# Patient Record
Sex: Male | Born: 1955
Health system: Southern US, Community
[De-identification: ages and names within clinical notes are randomized; demographics above are authoritative.]

## PROBLEM LIST (undated history)

## (undated) DIAGNOSIS — R1310 Dysphagia, unspecified: Secondary | ICD-10-CM

## (undated) DIAGNOSIS — R2681 Unsteadiness on feet: Secondary | ICD-10-CM

## (undated) DIAGNOSIS — Z720 Tobacco use: Secondary | ICD-10-CM

## (undated) DIAGNOSIS — T8859XA Other complications of anesthesia, initial encounter: Secondary | ICD-10-CM

## (undated) DIAGNOSIS — R296 Repeated falls: Secondary | ICD-10-CM

## (undated) DIAGNOSIS — S065XAA Traumatic subdural hemorrhage with loss of consciousness status unknown, initial encounter: Secondary | ICD-10-CM

## (undated) DIAGNOSIS — F32A Depression, unspecified: Secondary | ICD-10-CM

## (undated) DIAGNOSIS — W19XXXA Unspecified fall, initial encounter: Secondary | ICD-10-CM

## (undated) DIAGNOSIS — C801 Malignant (primary) neoplasm, unspecified: Secondary | ICD-10-CM

## (undated) DIAGNOSIS — Z87442 Personal history of urinary calculi: Secondary | ICD-10-CM

## (undated) DIAGNOSIS — Z86718 Personal history of other venous thrombosis and embolism: Secondary | ICD-10-CM

## (undated) DIAGNOSIS — M1990 Unspecified osteoarthritis, unspecified site: Secondary | ICD-10-CM

## (undated) DIAGNOSIS — K219 Gastro-esophageal reflux disease without esophagitis: Secondary | ICD-10-CM

## (undated) DIAGNOSIS — E785 Hyperlipidemia, unspecified: Secondary | ICD-10-CM

## (undated) DIAGNOSIS — D3A8 Other benign neuroendocrine tumors: Secondary | ICD-10-CM

## (undated) DIAGNOSIS — N289 Disorder of kidney and ureter, unspecified: Secondary | ICD-10-CM

## (undated) HISTORY — PX: CHOLECYSTECTOMY: SHX55

## (undated) HISTORY — PX: OTHER SURGICAL HISTORY: SHX169

## (undated) HISTORY — DX: Unspecified osteoarthritis, unspecified site: M19.90

## (undated) HISTORY — PX: ABDOMINAL SURGERY: SHX537

## (undated) HISTORY — DX: Traumatic subdural hemorrhage with loss of consciousness status unknown, initial encounter: S06.5XAA

## (undated) HISTORY — DX: Gastro-esophageal reflux disease without esophagitis: K21.9

## (undated) HISTORY — DX: Unsteadiness on feet: R26.81

## (undated) HISTORY — DX: Disorder of kidney and ureter, unspecified: N28.9

---

## 2008-05-18 ENCOUNTER — Ambulatory Visit: Payer: Self-pay | Admitting: Urology

## 2008-05-18 IMAGING — CT CT ABD-PELV W/O CM
1 of 2 series · 14 of 32 positions shown, 18 images · non-contrast
Comparison: none

REASON FOR EXAM: hematuria and bph hx kidney stones
COMMENTS:

PROCEDURE:     CT  - CT ABDOMEN AND PELVIS W[DATE] [DATE]
RESULT:     Indication: Renal stone.
TECHNIQUE: A standard renal stone protocol was performed with sequential 5
mm noncontrasted axial images from the lung bases to the symphysis pubis
with the patient in a supine position.

[Series 2: soft tissue · axial · 0.79mm/px · z∈[-1432,-1000]mm · 14 of 164 slices shown, 18 images]
[im 13/164  soft-tissue]
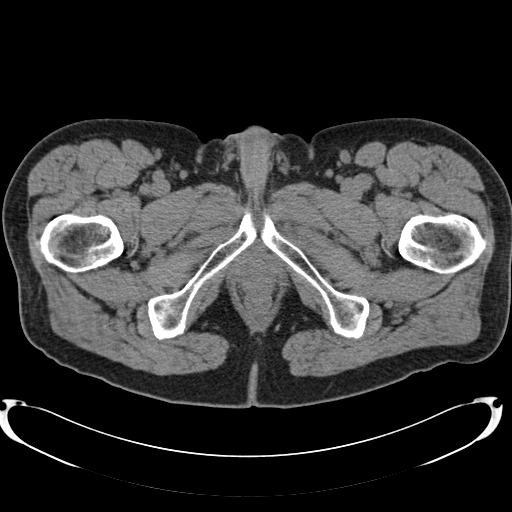
[im 13/164  bone]
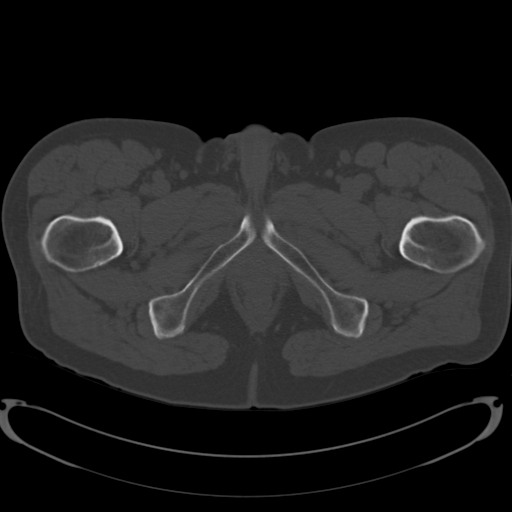
[im 25/164  soft-tissue]
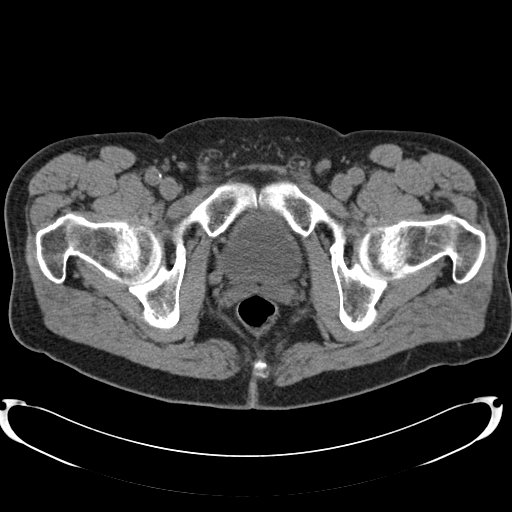
[im 37/164  soft-tissue]
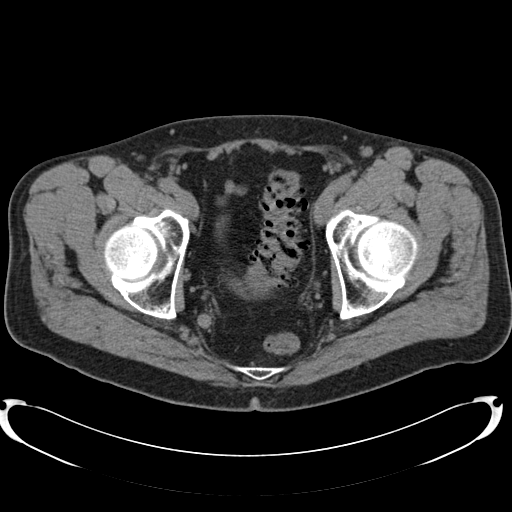
[im 49/164  soft-tissue]
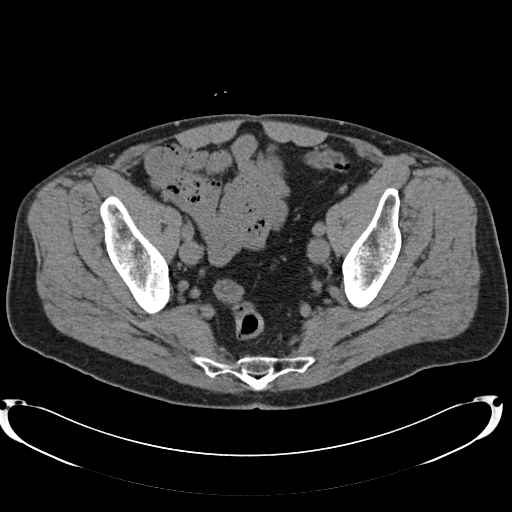
[im 61/164  soft-tissue]
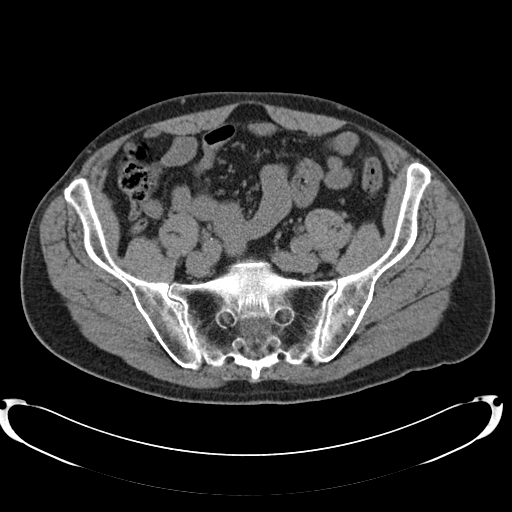
[im 73/164  soft-tissue]
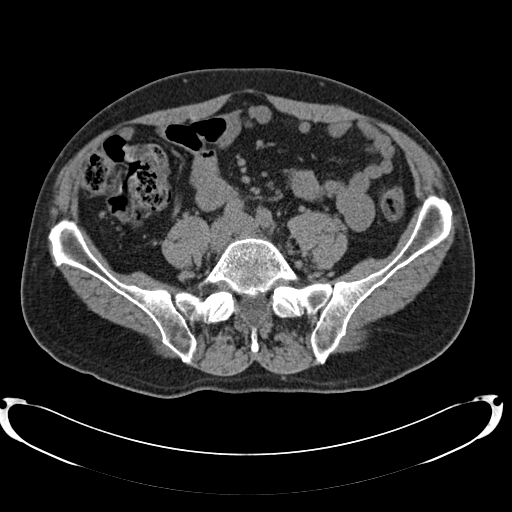
[im 91/164  soft-tissue]
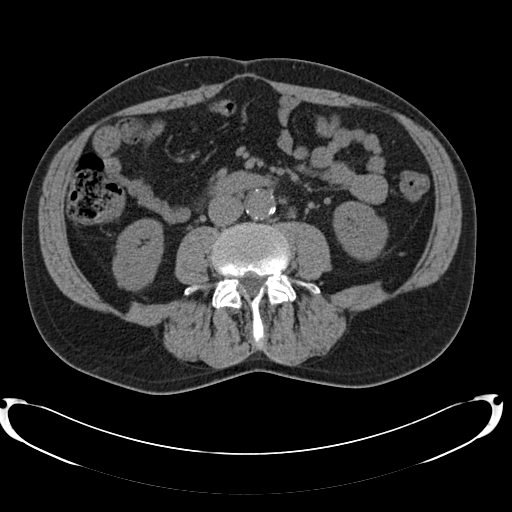
[im 103/164  soft-tissue]
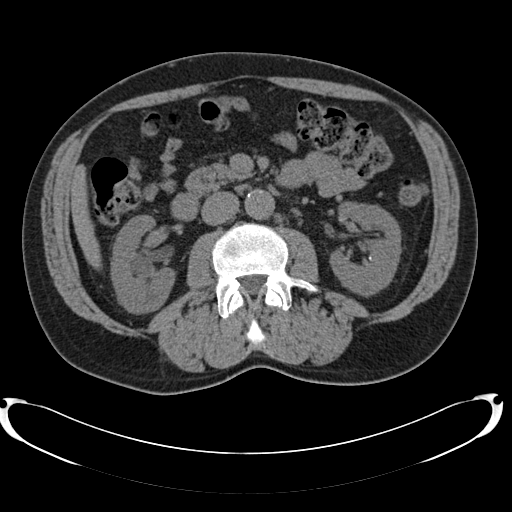
[im 115/164  soft-tissue]
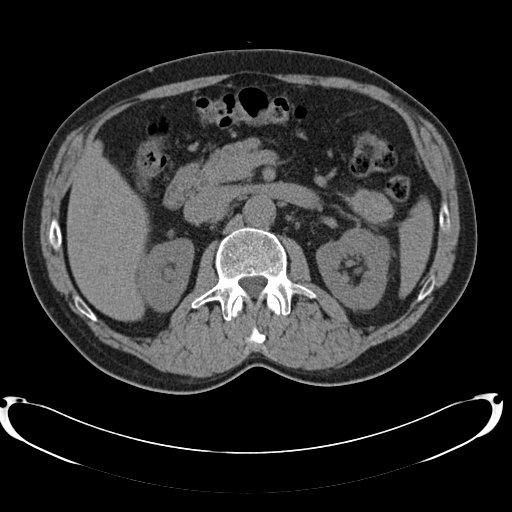
[im 115/164  bone]
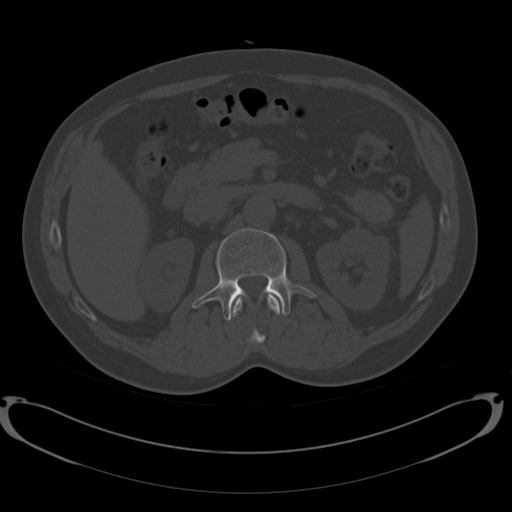
[im 127/164  soft-tissue]
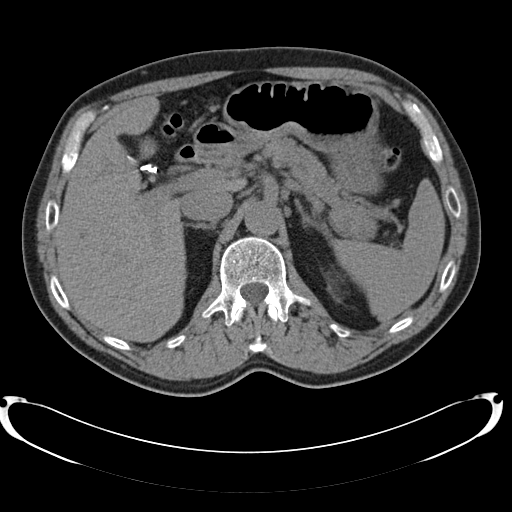
[im 139/164  soft-tissue]
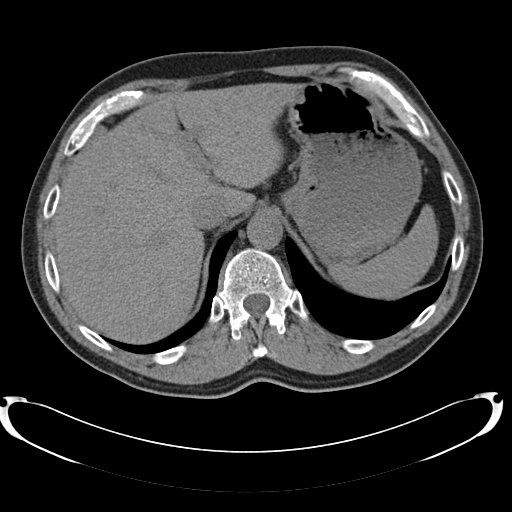
[im 139/164  lung]
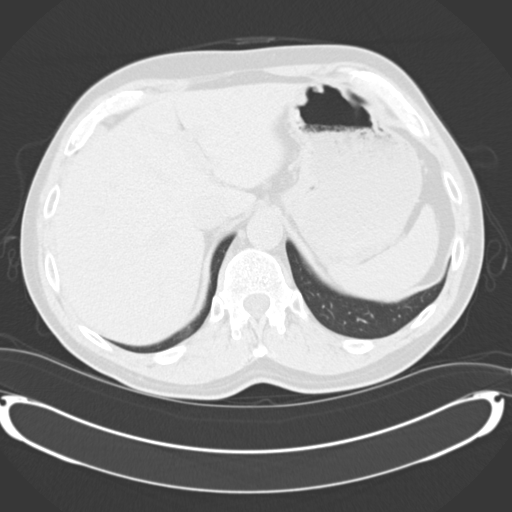
[im 145/164  lung]
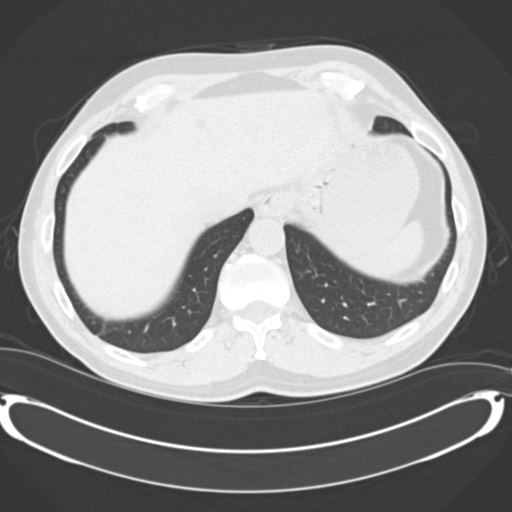
[im 151/164  soft-tissue]
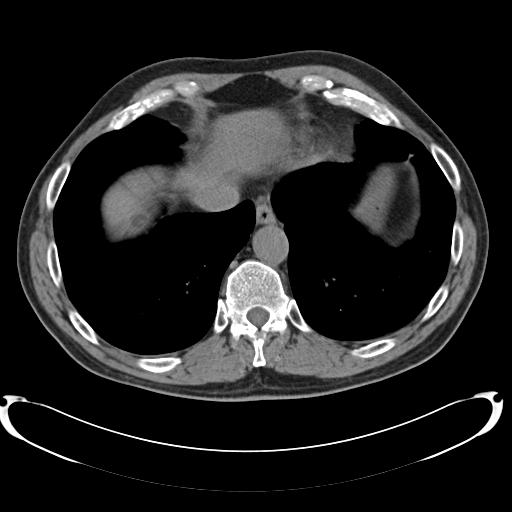
[im 151/164  lung]
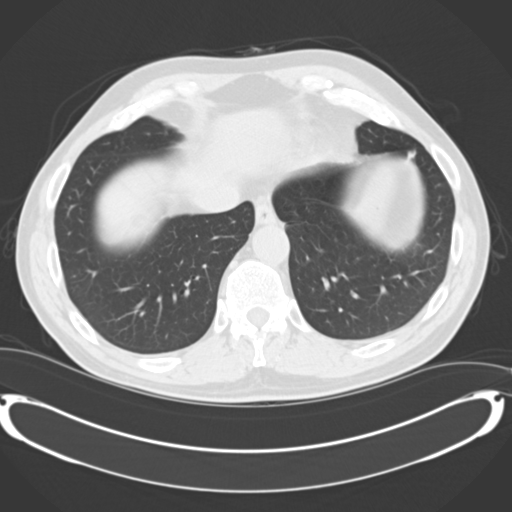
[im 157/164  lung]
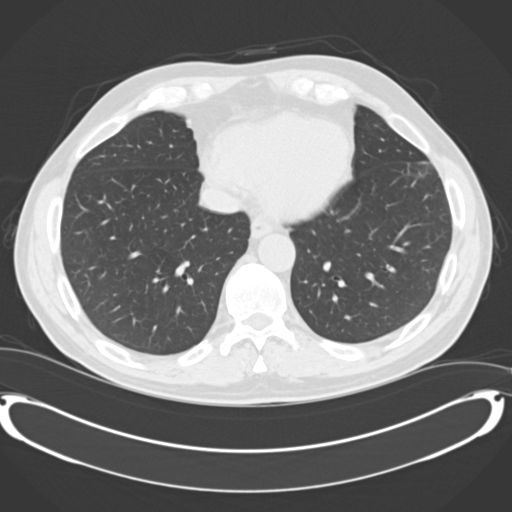

[14 of 32 positions shown; findings below may reference images not displayed]

FINDINGS: Limited views of the lung bases fail to demonstrate abnormal parenchymal
nodules or pleural or pericardial effusions.

There are 3 punctate nonobstructing left renal calculi and one punctate
nonobstructing right renal calculus. There is a 1.6 x 1.6 cm hypodense,
fluid attenuating posterior interpolar right renal mass most consistent with
a cyst. There no ureteral or bladder calculi. No obstructive uropathy. There
are no secondary signs of obstruction. No perinephric stranding is seen. The
kidneys are symmetric in size without evidence for exophytic mass.

The liver demonstrates no focal abnormality. The gallbladder is
unremarkable. The spleen demonstrates no focal abnormality. The adrenal
glands are normal. There is a 4.4 x 5.9 cm soft tissue mass in the region of
the pancreatic tail which is concerning for a pancreatic tail mass.

The unopacified stomach, duodenum, small intestine, and large intestine are
unremarkable, but evaluation is limited by lack of oral contrast. A normal
caliber, air filled appendix is visualized in the right lower quadrant
without. Appendiceal inflammatory changes. There is diverticulosis of the
sigmoid colon without evidence of diverticulitis. There is no
pneumoperitoneum, pneumatosis, or portal venous gas. There is no abdominal
or pelvic free fluid. There is no lymphadenopathy.

Abdominal aorta is normal in caliber. There is atherosclerosis of the
abdominal aorta.

The osseous structures are unremarkable.
IMPRESSION: 1. Nonobstructing bilateral renal calculi without obstructive uropathy.

2. There is a 4.4 x 5.9 cm soft tissue mass in the region of the pancreatic
tail which is concerning for a pancreatic tail mass. Recommend further
evaluation with a contrast-enhanced CT of the abdomen.

These findings were communicated to Dr. MIREE on [DATE] at [TH]
hours.

## 2008-05-31 ENCOUNTER — Ambulatory Visit: Payer: Self-pay | Admitting: Family Medicine

## 2008-05-31 IMAGING — CT CT ABDOMEN W/ CM
1 of 3 series · 13 of 32 positions shown, 19 images · non-contrast
Comparison: none

REASON FOR EXAM: pancreatic mass
COMMENTS:

[Series 2: soft tissue · axial · 0.78mm/px · z∈[+36,+258]mm · 13 of 88 slices shown, 19 images]
[im 7/88  soft-tissue]
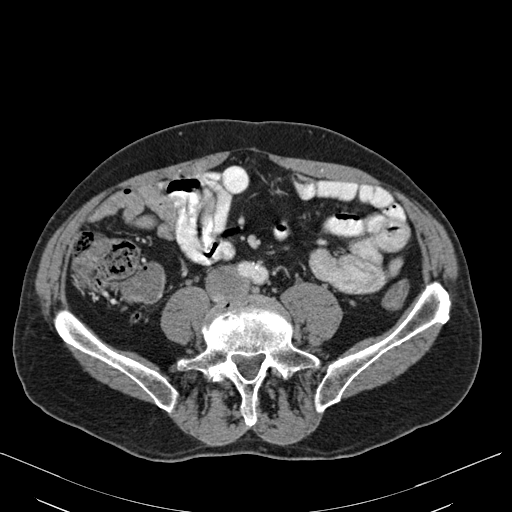
[im 7/88  bone]
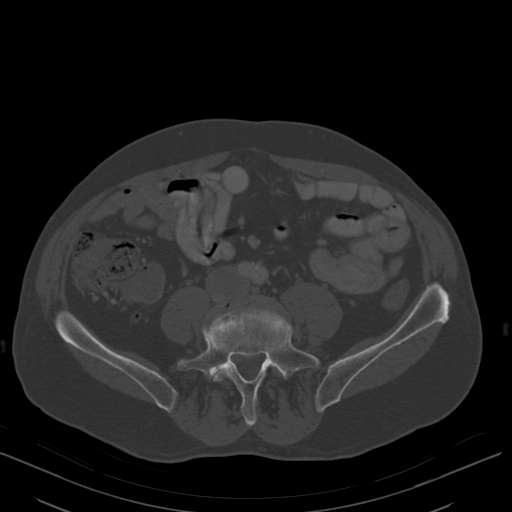
[im 13/88  soft-tissue]
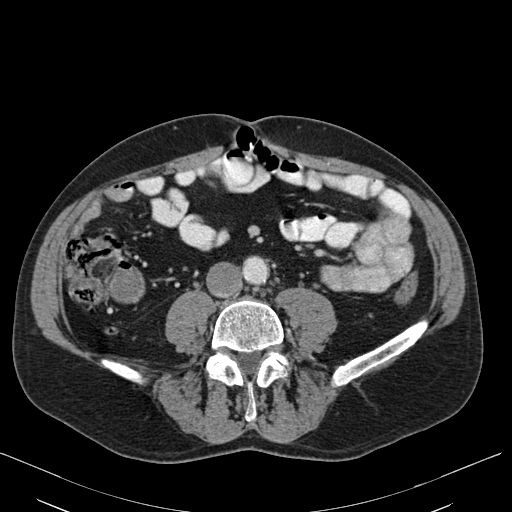
[im 19/88  soft-tissue]
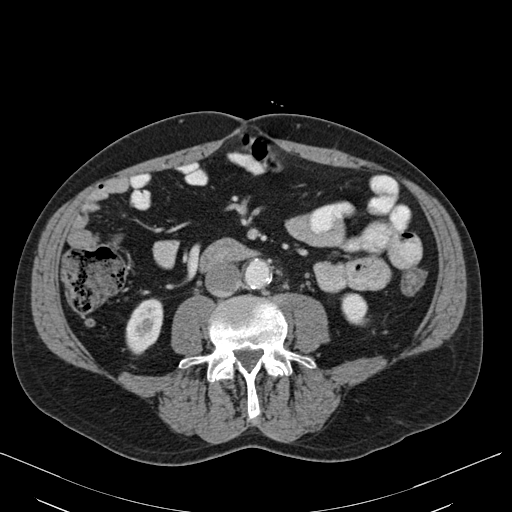
[im 25/88  soft-tissue]
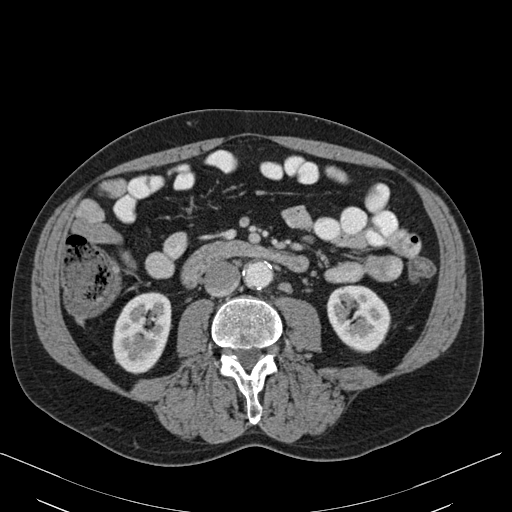
[im 32/88  soft-tissue]
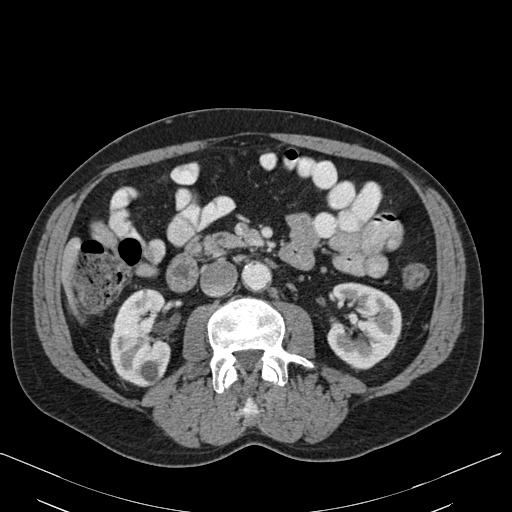
[im 38/88  soft-tissue]
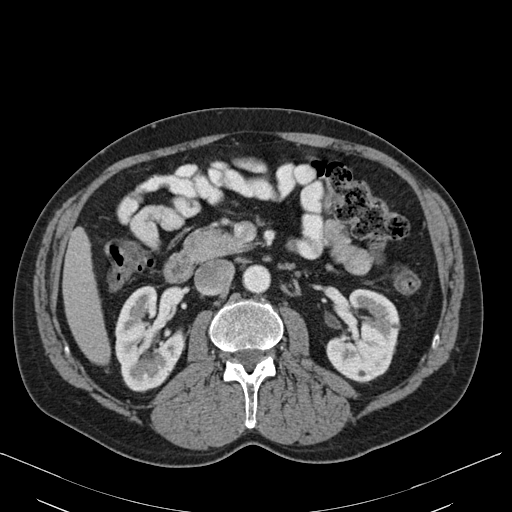
[im 44/88  soft-tissue]
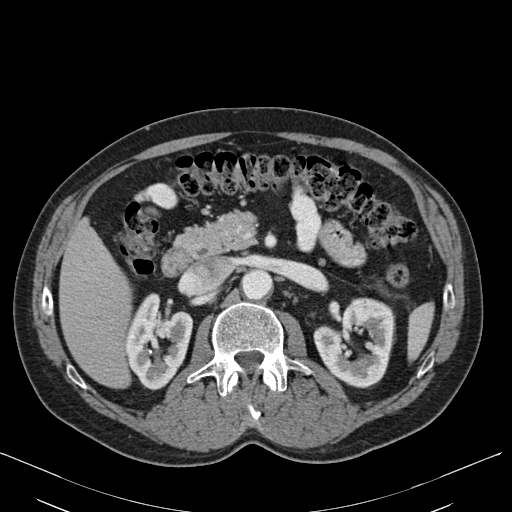
[im 50/88  soft-tissue]
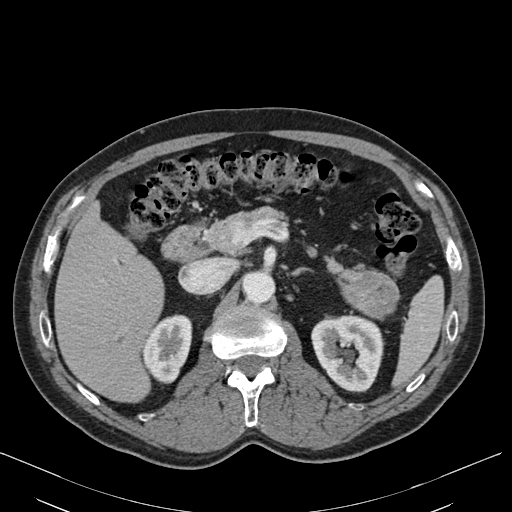
[im 56/88  soft-tissue]
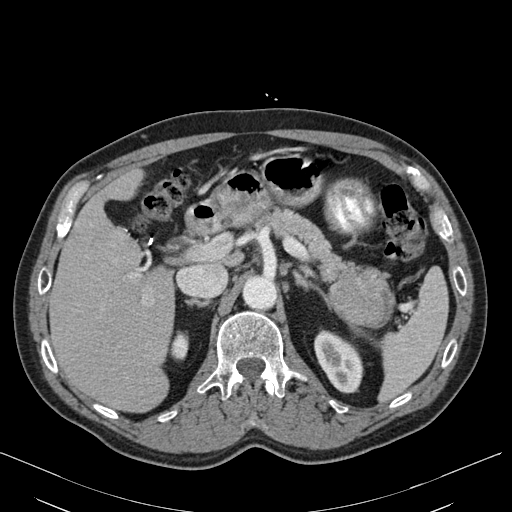
[im 56/88  bone]
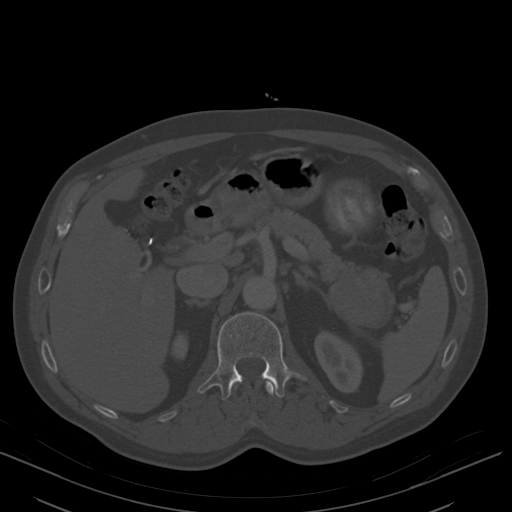
[im 63/88  soft-tissue]
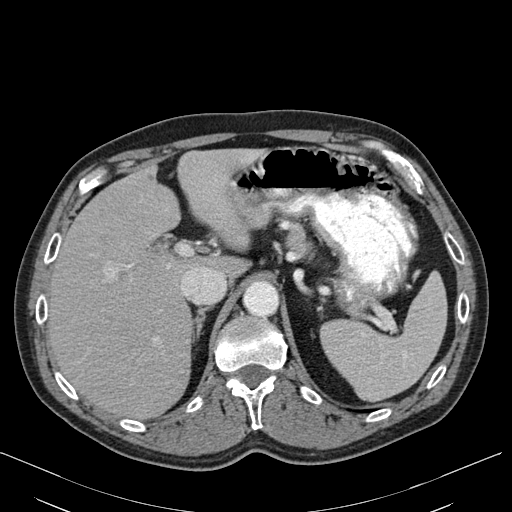
[im 63/88  lung]
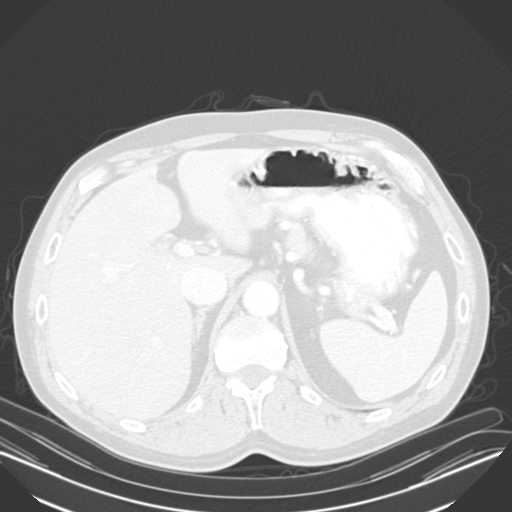
[im 69/88  soft-tissue]
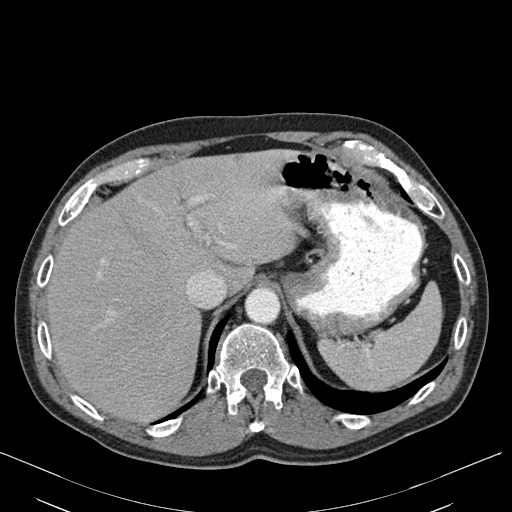
[im 69/88  lung]
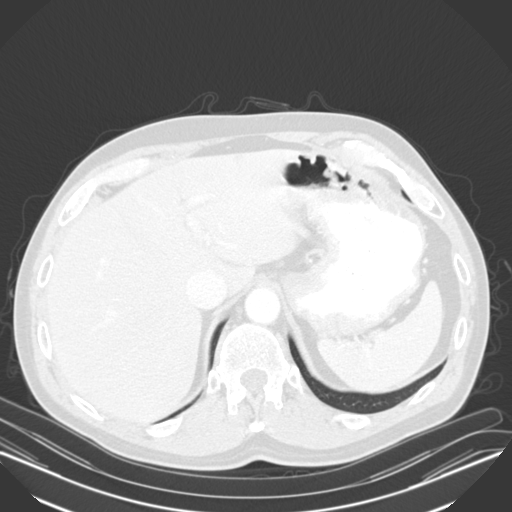
[im 75/88  soft-tissue]
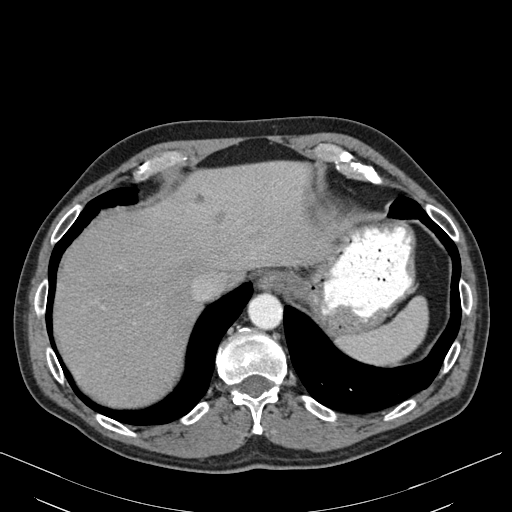
[im 75/88  lung]
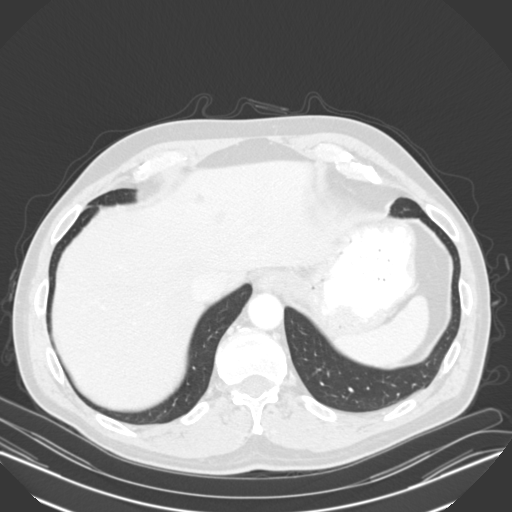
[im 81/88  soft-tissue]
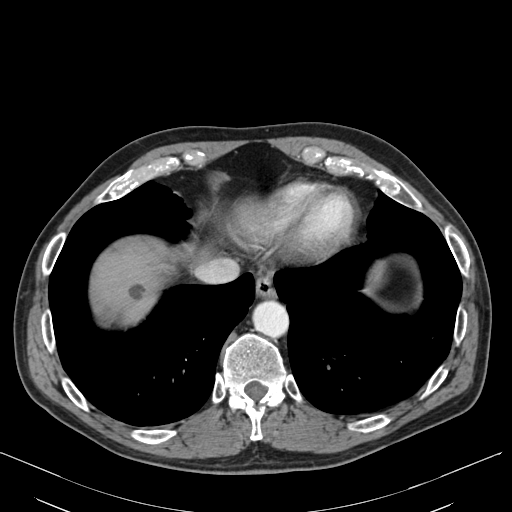
[im 81/88  lung]
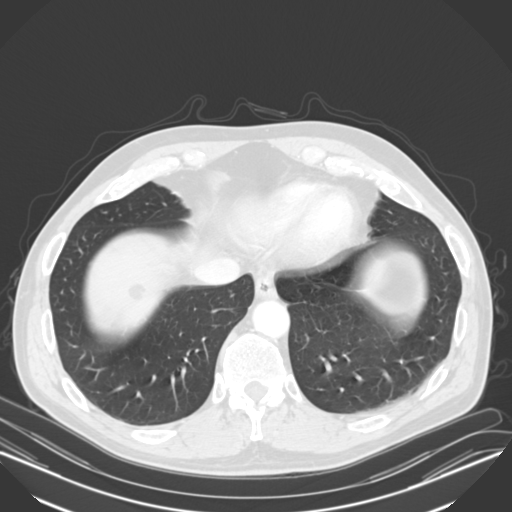

[13 of 32 positions shown; findings below may reference images not displayed]

PROCEDURE:     MANIA - MANIA ABDOMEN COMPLEX W  - [DATE]  [DATE]

RESULT:     CT of the abdomen is performed utilizing approximately 100 ml of
[GL] iodinated intravenous contrast along with oral contrast.
Correlation is made with the previous noncontrast exam dated [DATE].

There is a large soft tissue mass arising from the pancreatic tail. This
measures up to 5.5 x 3.4 cm. Malignancy is certainly a primary
consideration. The liver demonstrates multiple nonenhancing low attenuation
areas suggestive of multiple small cysts. Cholecystectomy changes are
present. There is a cyst present in the posteromedial aspect of the lower
portion of the RIGHT kidney measuring up to approximately 2.0 cm. Smaller
similar areas are present in both kidneys. The spleen and adrenal glands are
unremarkable. There is no abnormal bowel distention. The abdominal aorta is
normal in caliber and contains some atherosclerotic calcification. There is
no definite adenopathy. There is some thickening of the anterior perirenal
fascia on the margin of the mass.
IMPRESSION: 1. Large pancreatic tail mass. Malignancy must be excluded. No definite
metastatic disease is evident.
2. Multiple small cysts in the liver, presumably benign.
3. Bilateral small renal cyst with a 2 mm posterior mid to lower portion
RIGHT renal cyst.

## 2008-06-22 DIAGNOSIS — D3A8 Other benign neuroendocrine tumors: Secondary | ICD-10-CM | POA: Insufficient documentation

## 2008-08-17 ENCOUNTER — Ambulatory Visit: Payer: Self-pay | Admitting: Family Medicine

## 2008-08-17 IMAGING — US US EXTREM LOW VENOUS*L*
1 series · 17 of 24 positions shown · non-contrast
Comparison: none

REASON FOR EXAM: call report  [PHONE_NUMBER] x [KU]  edema  eval for DVT
COMMENTS:

PROCEDURE:     US  - US DOPPLER LOW EXTR LEFT  - [DATE]  [DATE]
RESULT:     Comparison: No comparison

[Series 1: us extrem low venous*left* · 17 of 33 slices shown]
[im 1/33]
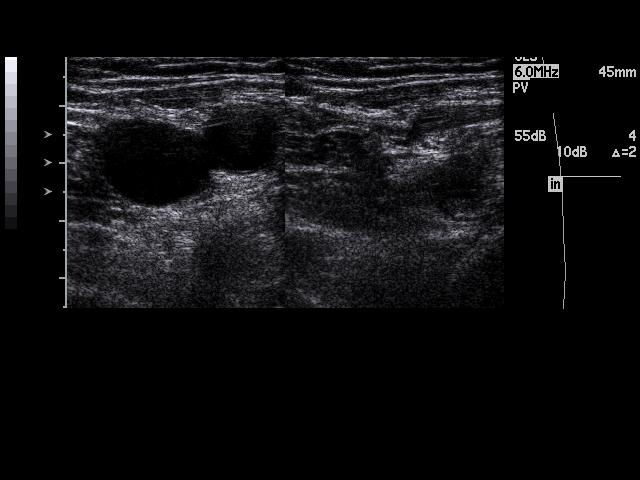
[im 3/33]
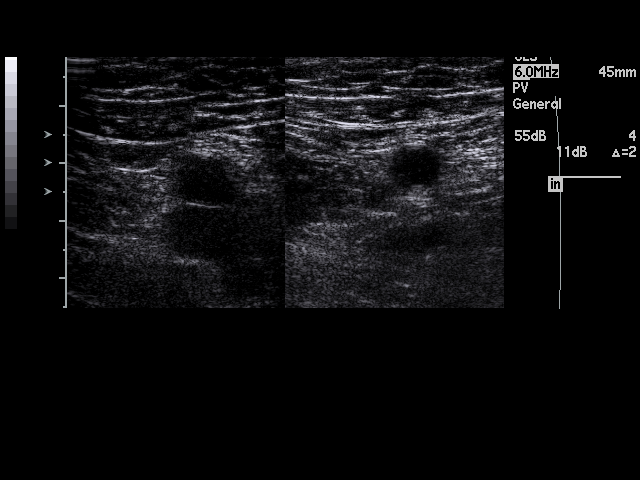
[im 5/33]
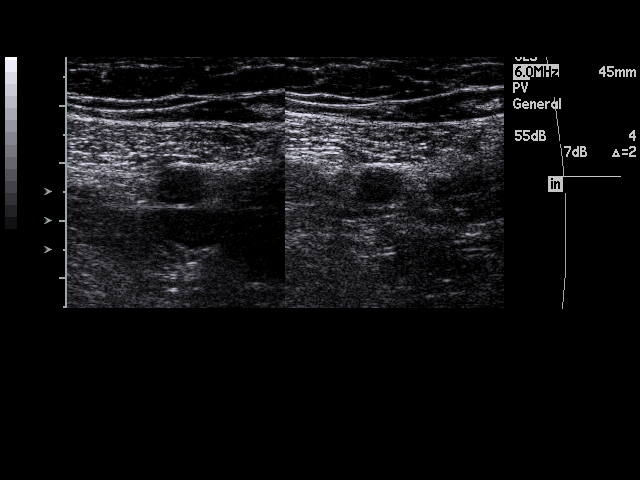
[im 6/33]
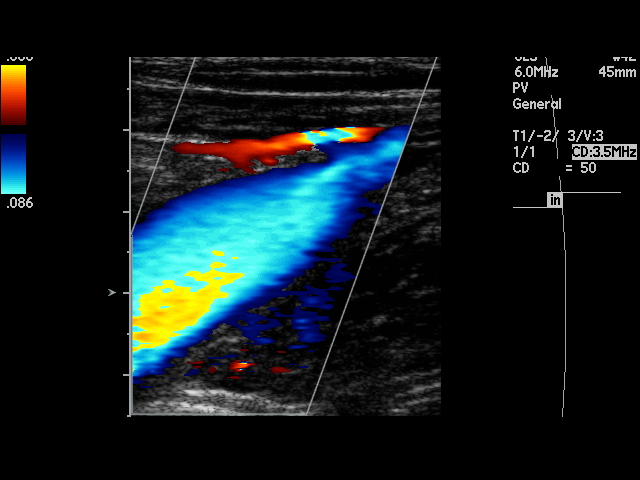
[im 9/33]
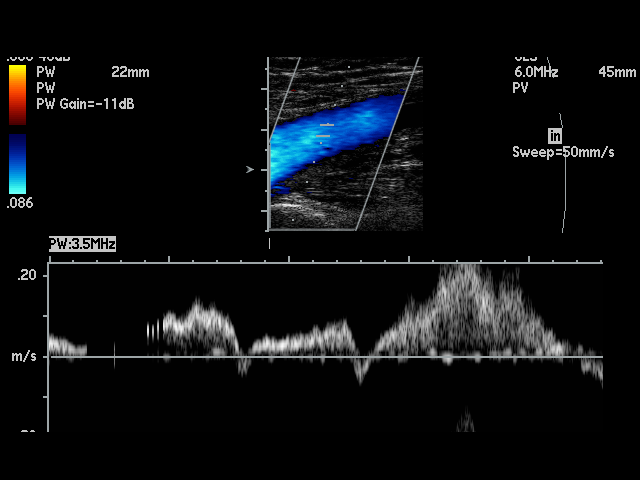
[im 10/33]
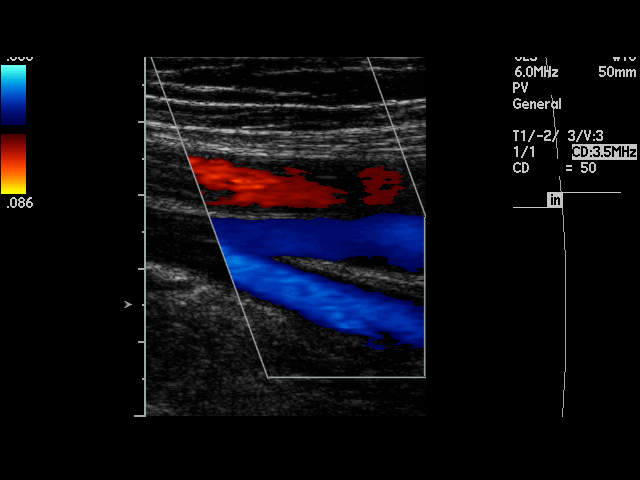
[im 13/33]
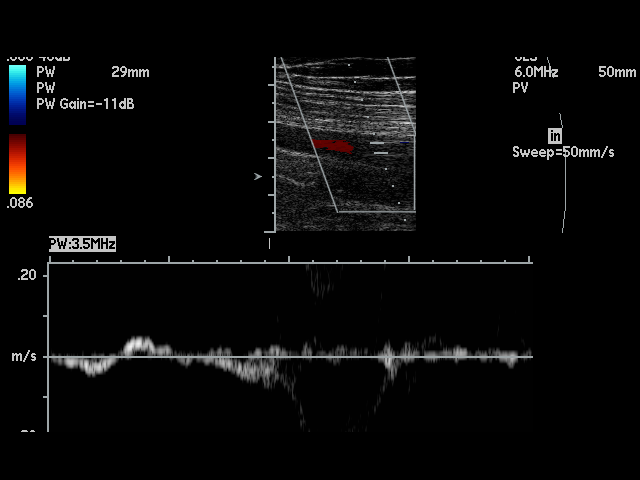
[im 14/33]
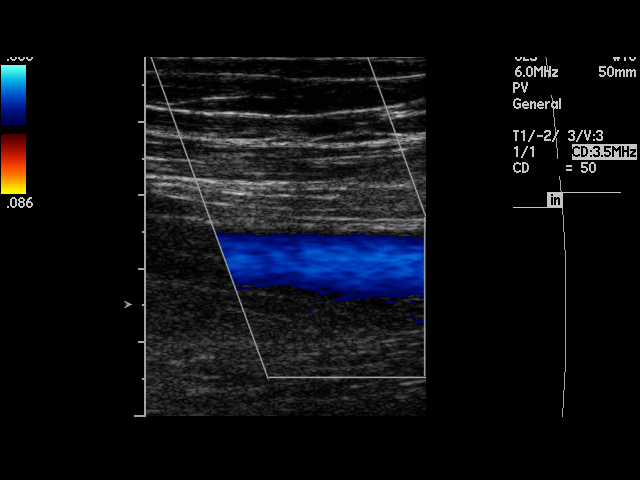
[im 17/33]
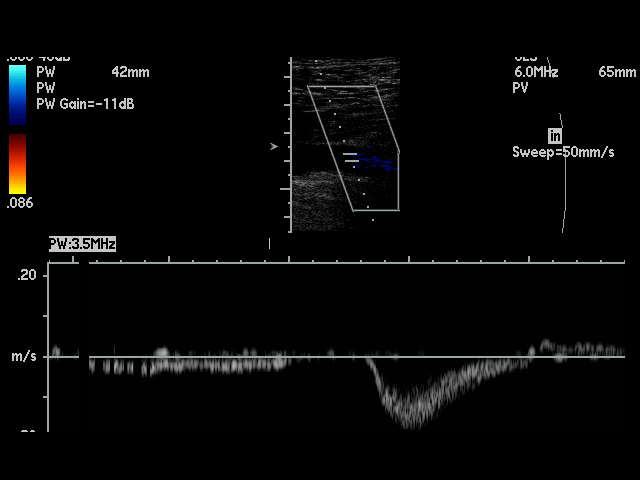
[im 19/33]
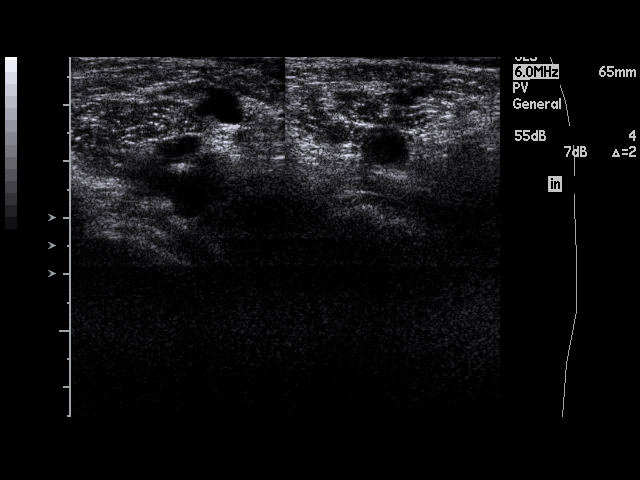
[im 20/33]
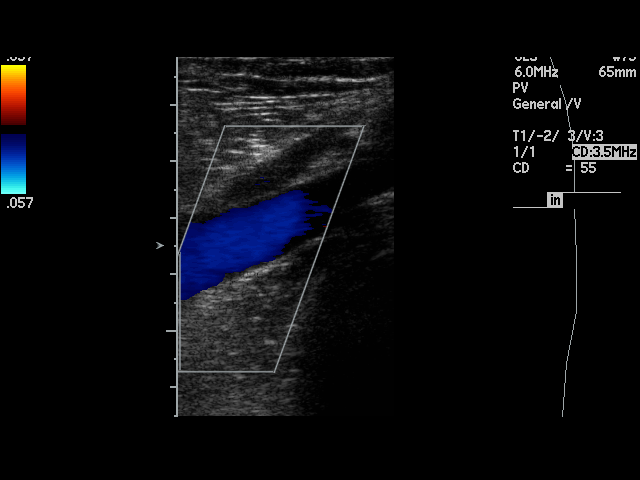
[im 23/33]
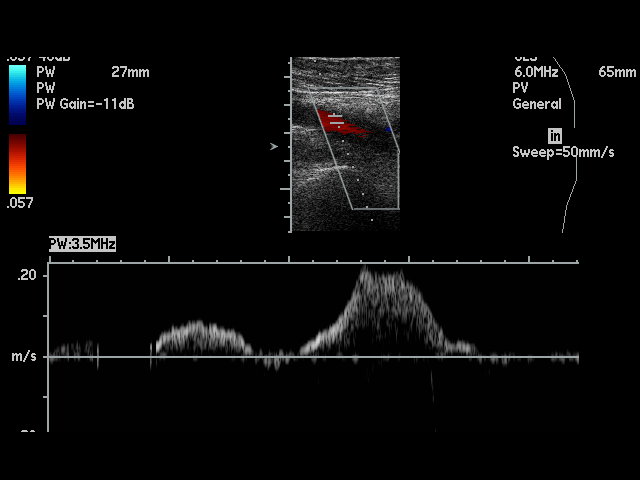
[im 24/33]
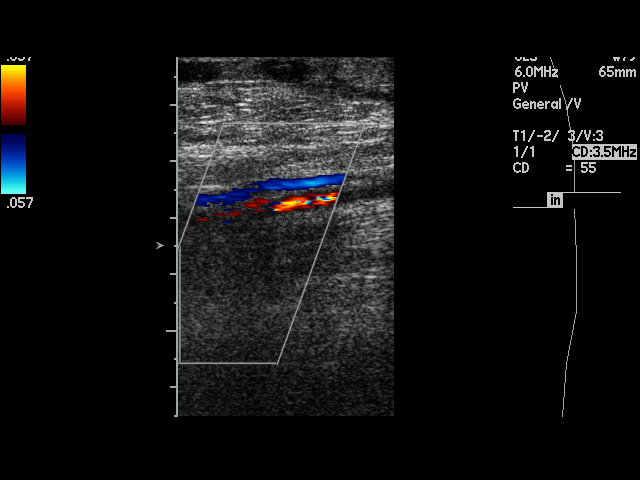
[im 27/33]
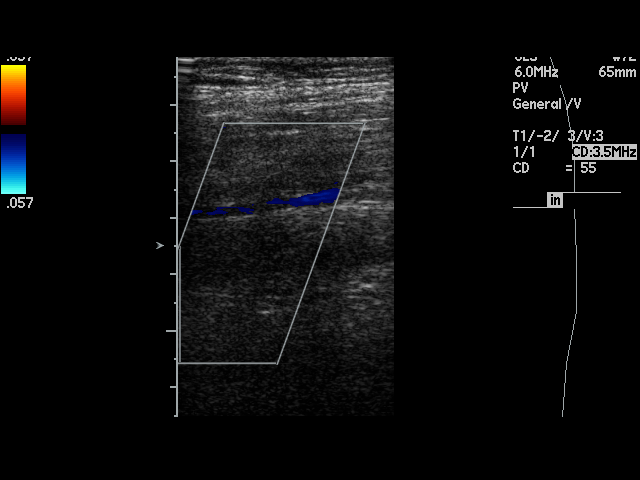
[im 28/33]
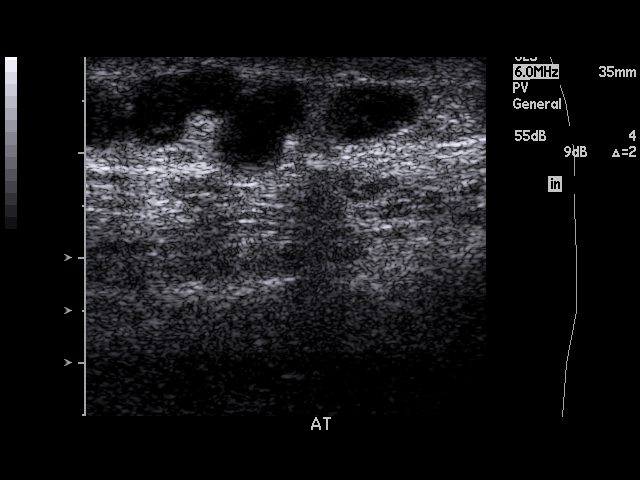
[im 30/33]
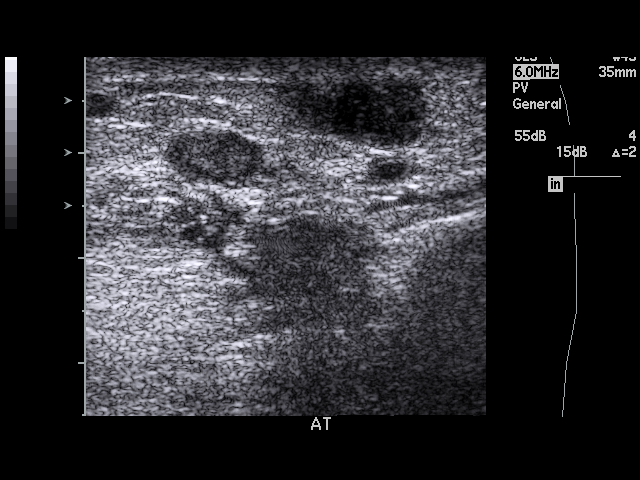
[im 33/33]
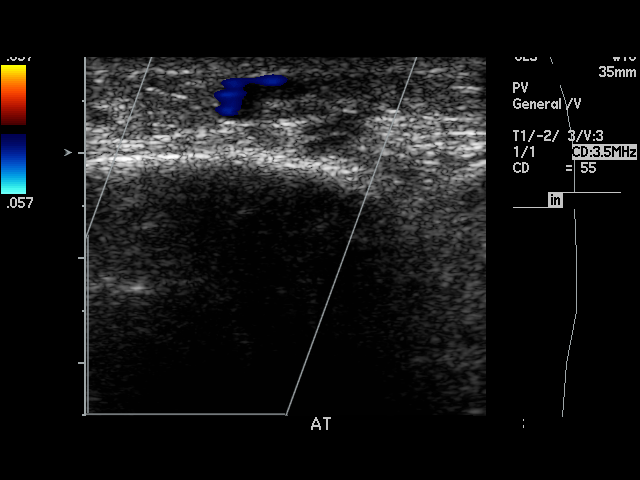

[17 of 24 positions shown; findings below may reference images not displayed]

FINDINGS: Multiple longitudinal and transverse gray-scale as well as color
and spectral Doppler images of the left lower extremity veins were obtained
from the common femoral veins through the popliteal veins.

The left common femoral, greater saphenous, femoral, popliteal veins, and
venous trifurcation are patent, demonstrating normal color-flow and
compressibility. No intraluminal thrombus is identified.There is normal
respiratory variation and augmentation demonstrated at all vein levels.

Incidental note is made of varicose veins which are noncompressible
consistent with thrombosis. These are not part of the deep venous system.
IMPRESSION: 1. No evidence of DVT in the left lower extremity.

2. Incidental note is made of varicose veins which are noncompressible
consistent with thrombosis. These are not part of the deep venous system.

## 2008-08-21 ENCOUNTER — Inpatient Hospital Stay (HOSPITAL_COMMUNITY): Admission: EM | Admit: 2008-08-21 | Discharge: 2008-08-28 | Payer: Self-pay | Admitting: Emergency Medicine

## 2008-08-21 ENCOUNTER — Encounter (INDEPENDENT_AMBULATORY_CARE_PROVIDER_SITE_OTHER): Payer: Self-pay | Admitting: Orthopaedic Surgery

## 2008-08-21 IMAGING — RF DG HIP OPERATIVE*R*
1 series · 5 of 5 positions shown · non-contrast
Comparison: Preoperative studies [DATE].

CLINICAL DATA: 53-year-old male with ORIF right hip.

OPERATIVE RIGHT HIP

[Series 1: run · 5 of 5 slices shown]
[im 1/5]
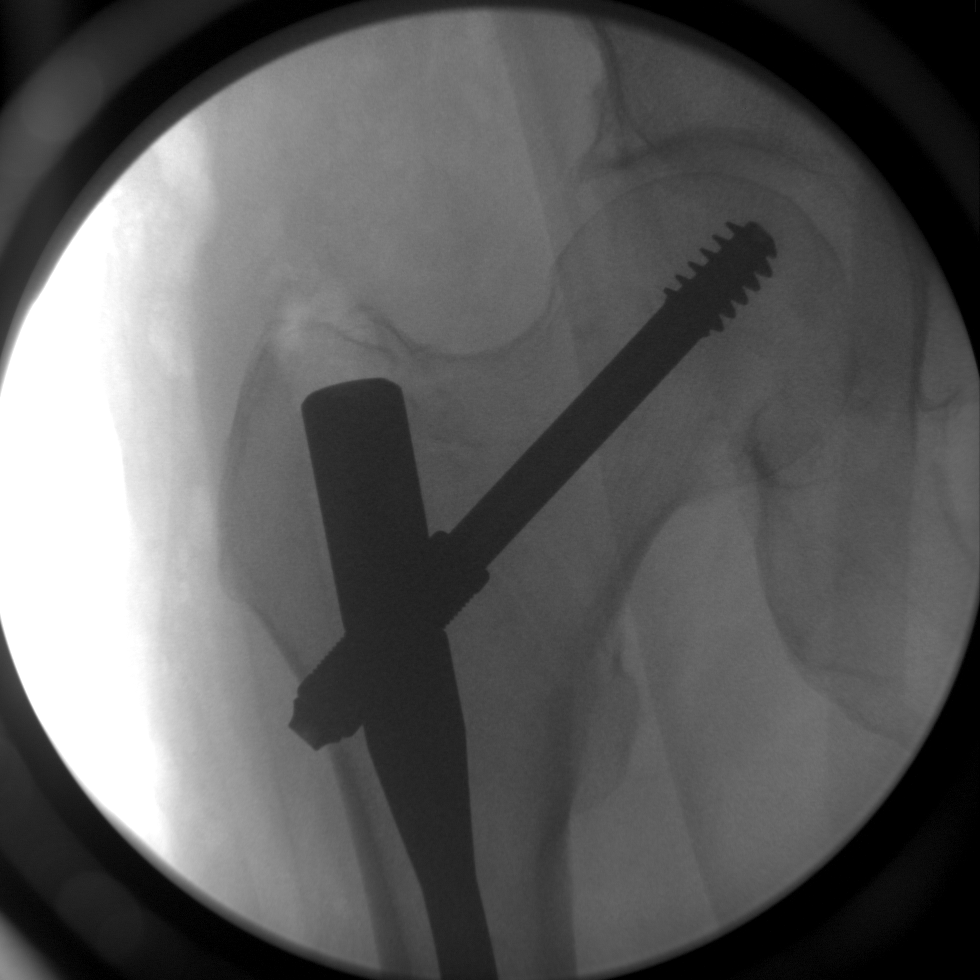
[im 2/5]
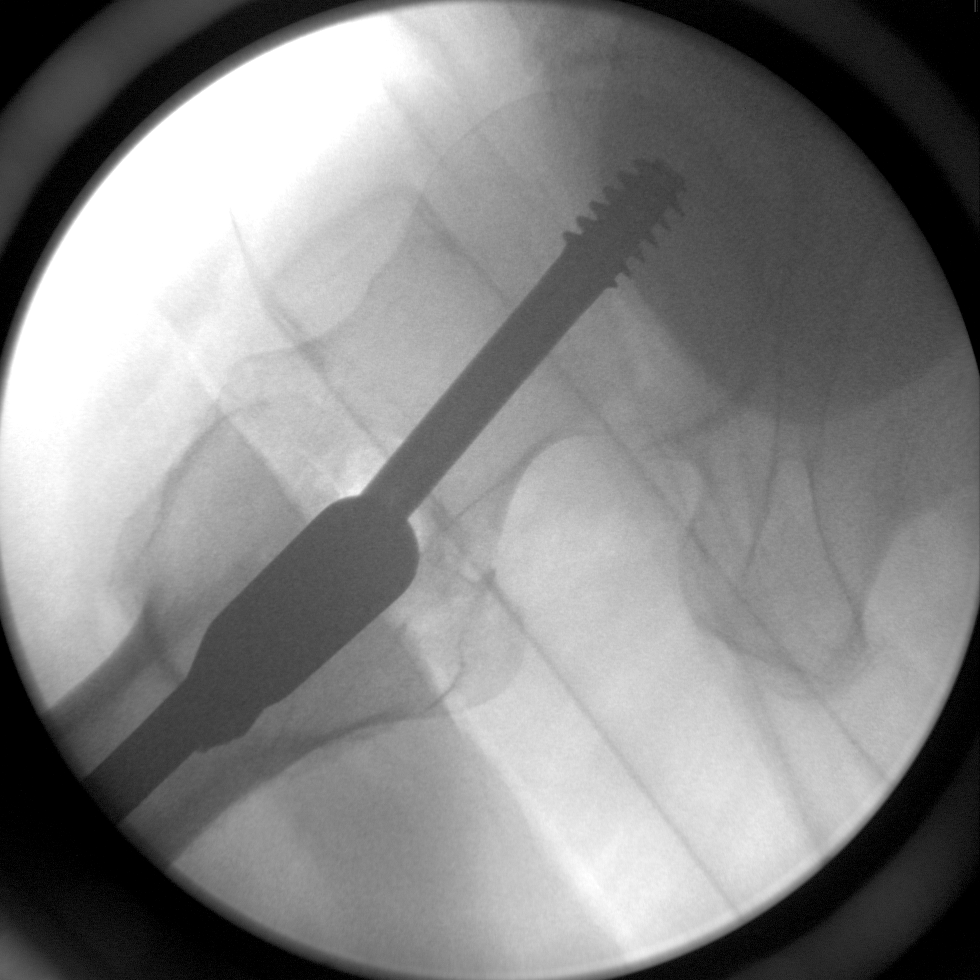
[im 3/5]
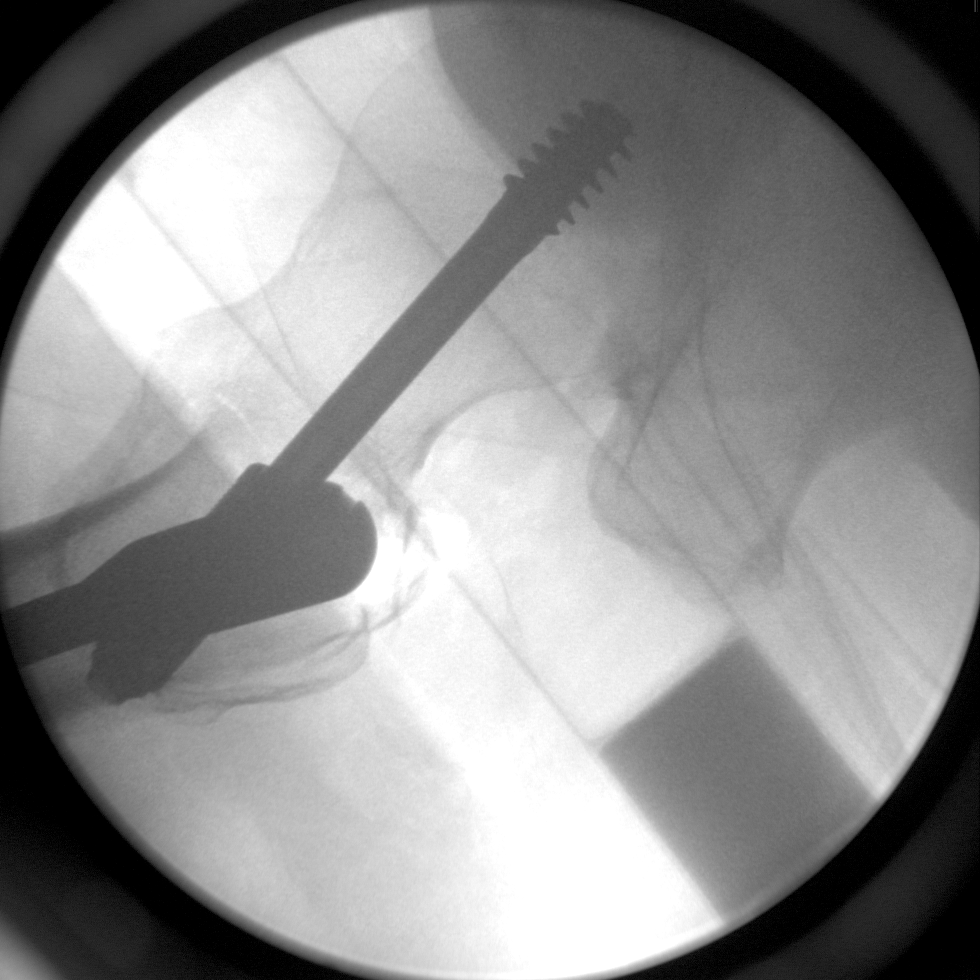
[im 4/5]
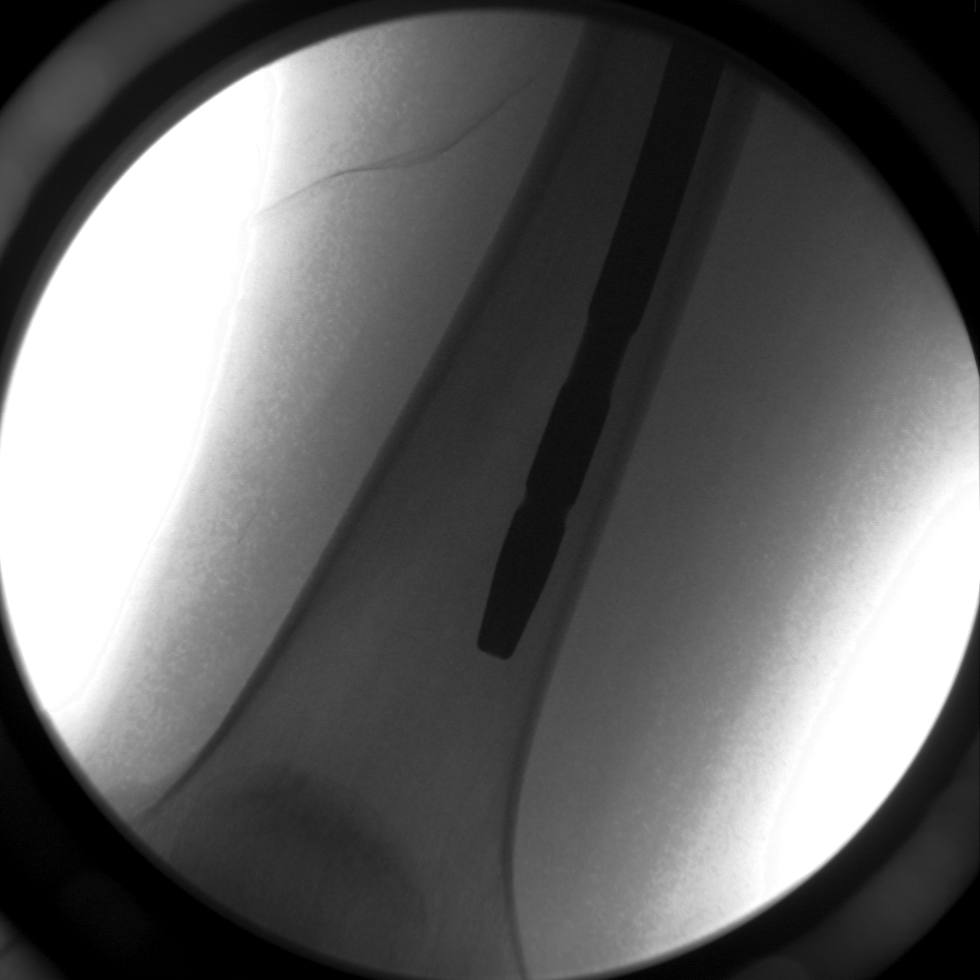
[im 5/5]
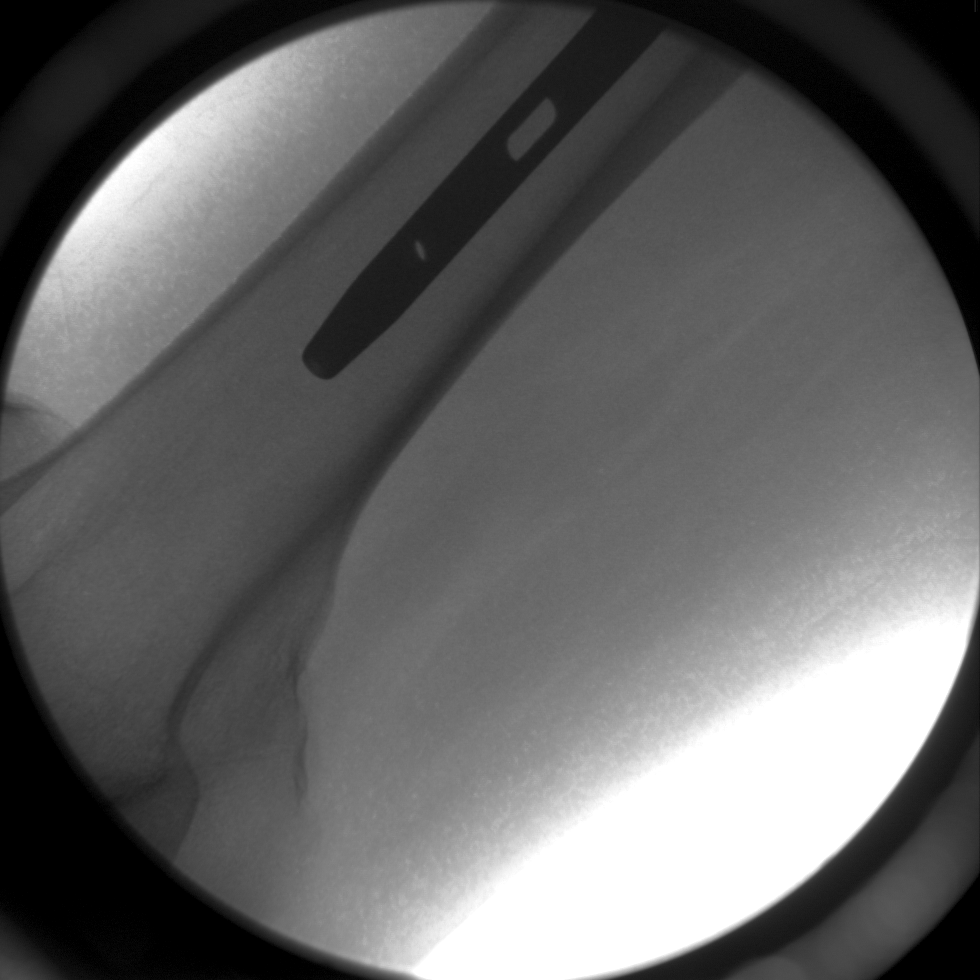

[5 of 5 positions shown; findings below may reference images not displayed]

FINDINGS: Five fluoroscopic spot views of the right hip in the
operating room.  Dynamic hip screw traversing the right femoral
neck.  Interval reduction of comminuted intertrochanteric fracture
with significantly improved alignment.  Mild posterior displacement
of the distal fragment remains.  Intramedullary nail in place.
Visualized hardware appears intact.
IMPRESSION: ORIF right hip with reduction of comminuted intertrochanteric
fracture.

## 2008-08-21 IMAGING — CR DG PELVIS 1-2V
1 series · 1 of 1 positions shown · non-contrast
Comparison: None

CLINICAL DATA: Fall, hip pain.

PELVIS - 1-2 VIEW

[t pelvis a.p.]
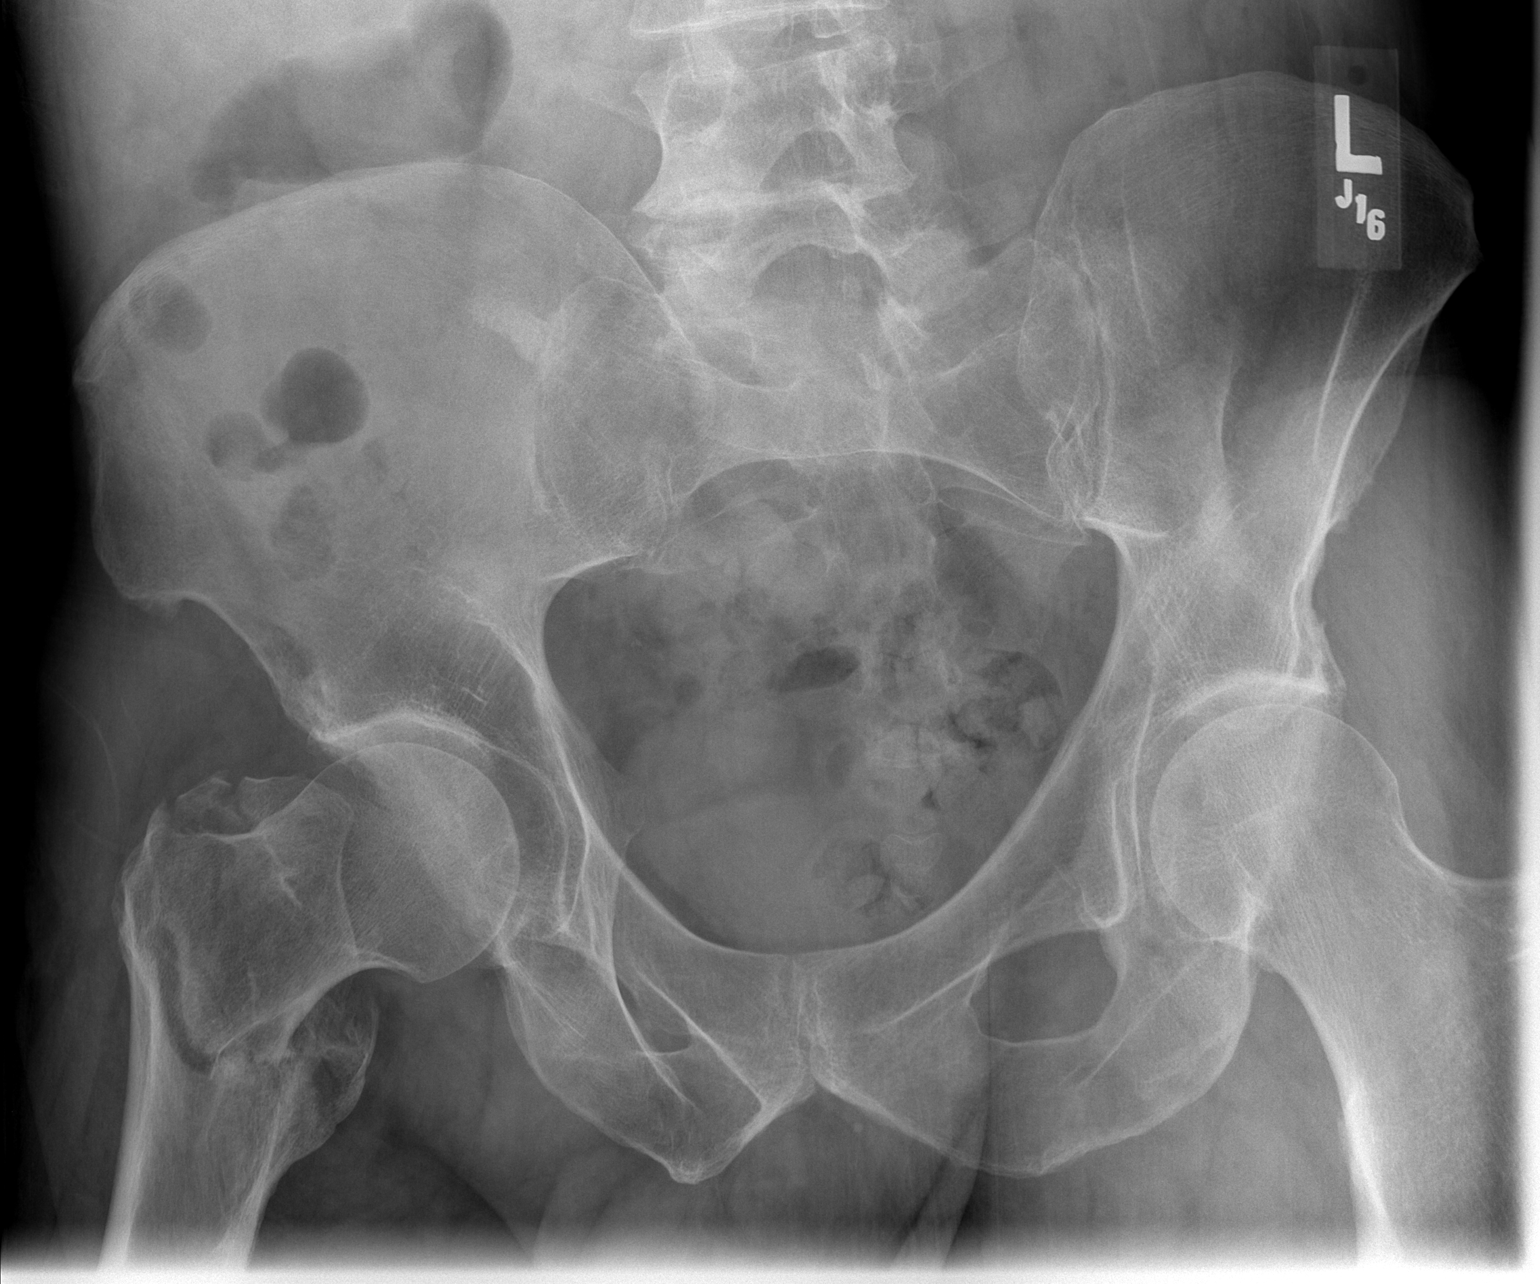

[1 of 1 positions shown; findings below may reference images not displayed]

FINDINGS: There is a comminuted right intertrochanteric fracture.
Mild varus angulation.  No evidence of dislocation.
IMPRESSION: Right intertrochanteric fracture.

REF:W2 DICTATED: [DATE] [DATE]

## 2008-08-21 IMAGING — CR DG HIP COMPLETE 2+V*R*
2 series · 2 of 2 positions shown · non-contrast
Comparison: None

CLINICAL DATA: Fall, right hip pain.

RIGHT HIP - COMPLETE 2+ VIEW

[t hip ap right]
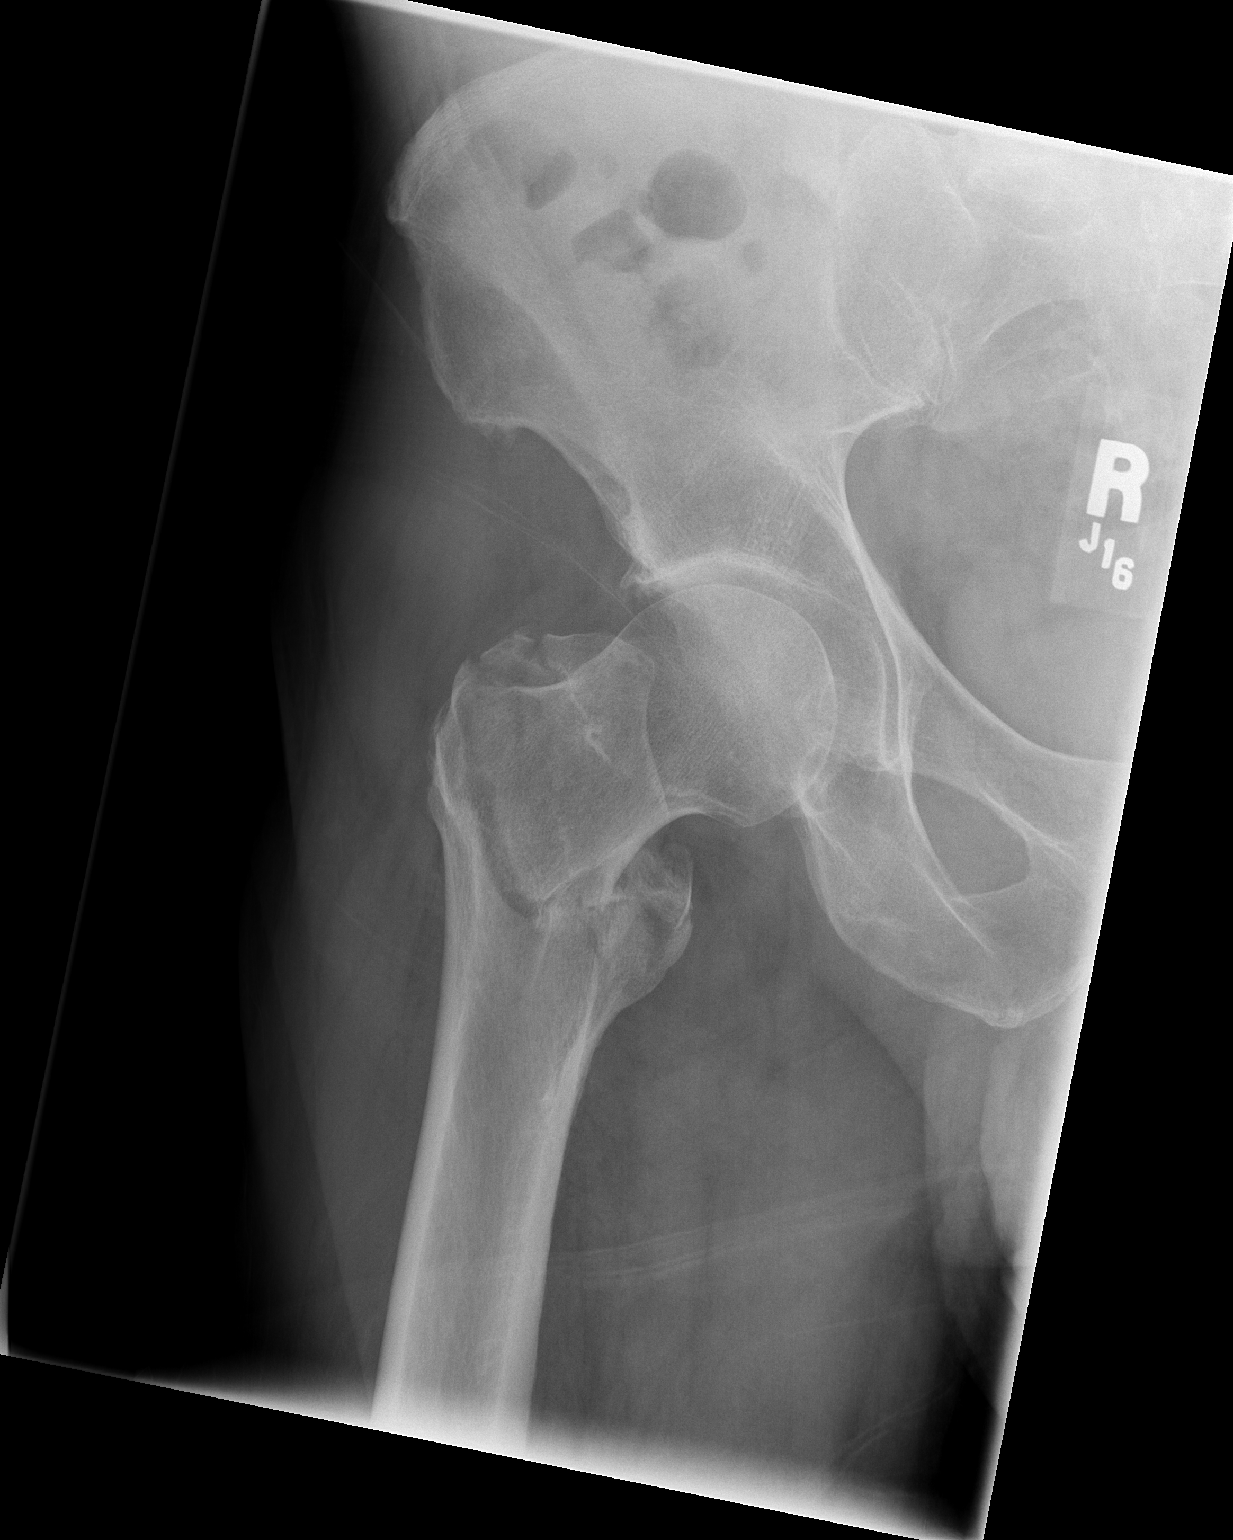

[w cross table hip right *]
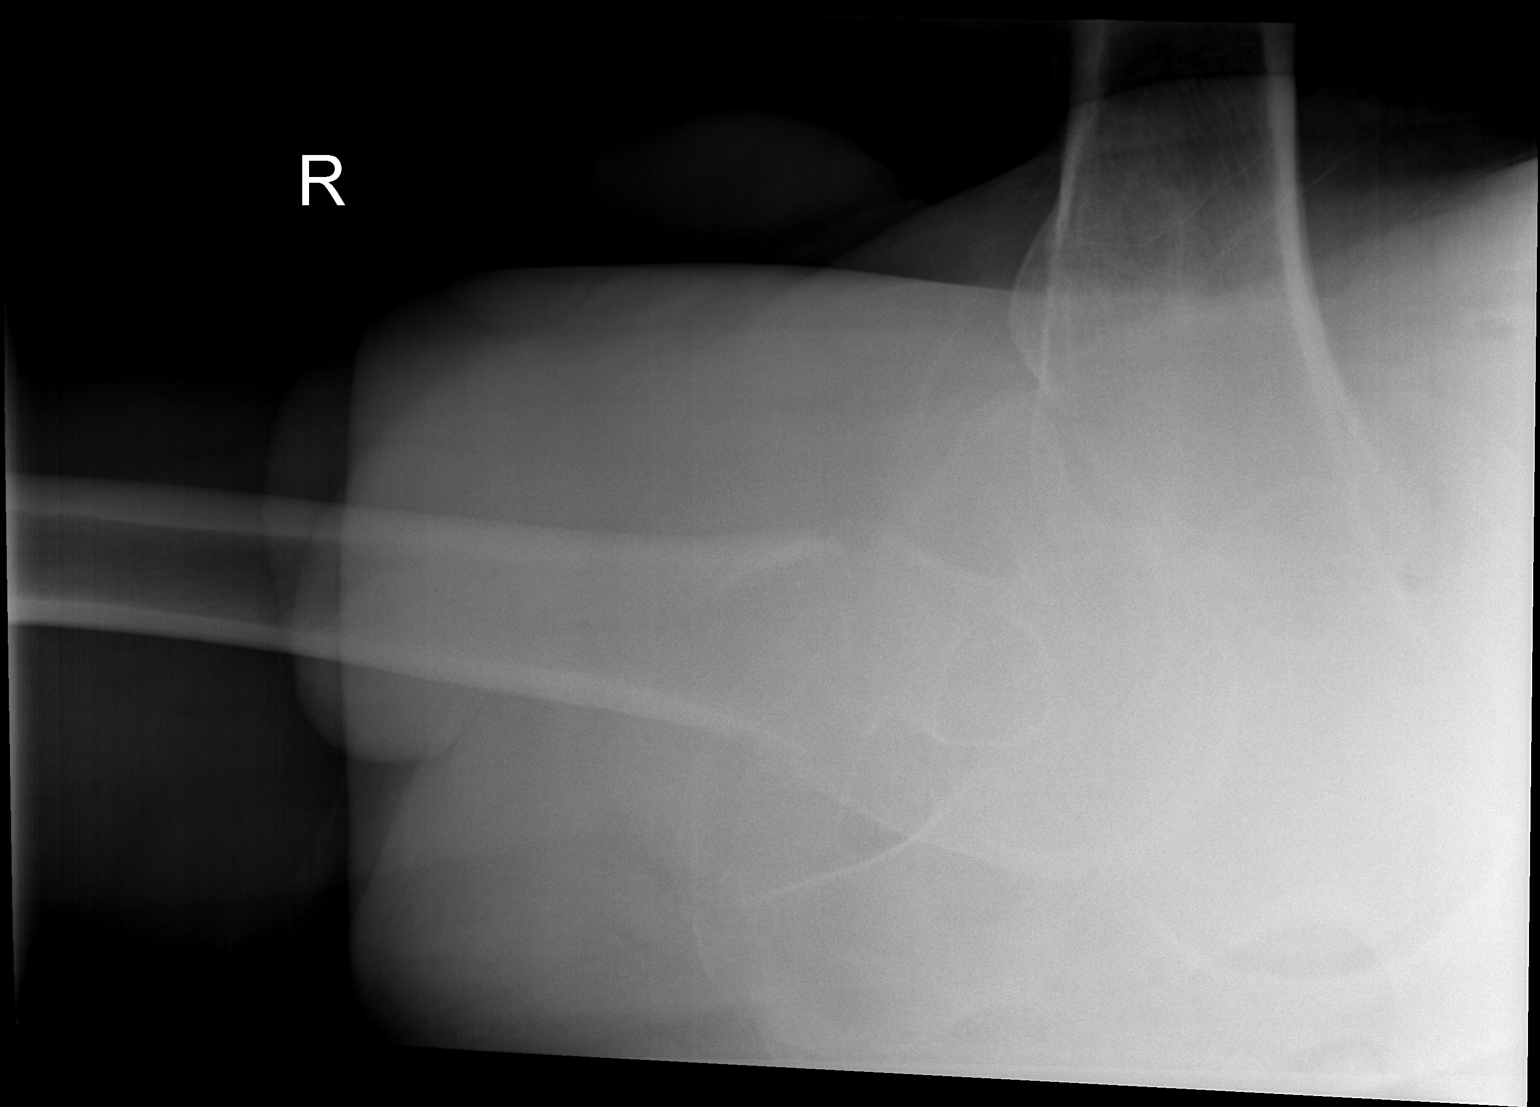

[2 of 2 positions shown; findings below may reference images not displayed]

FINDINGS: There is a comminuted right intertrochanteric fracture.
Mild varus angulation.  Mild displacement of fracture fragments.
No dislocation.
IMPRESSION: Mildly comminuted, displaced right intertrochanteric fracture noted

REF:W2 DICTATED: [DATE] [DATE]

## 2008-08-21 IMAGING — CR DG CHEST 2V
4 series · 4 of 4 positions shown · non-contrast
Comparison: None

CLINICAL DATA: Fall

CHEST - 2 VIEW

[w chest lat (1 of 2)]
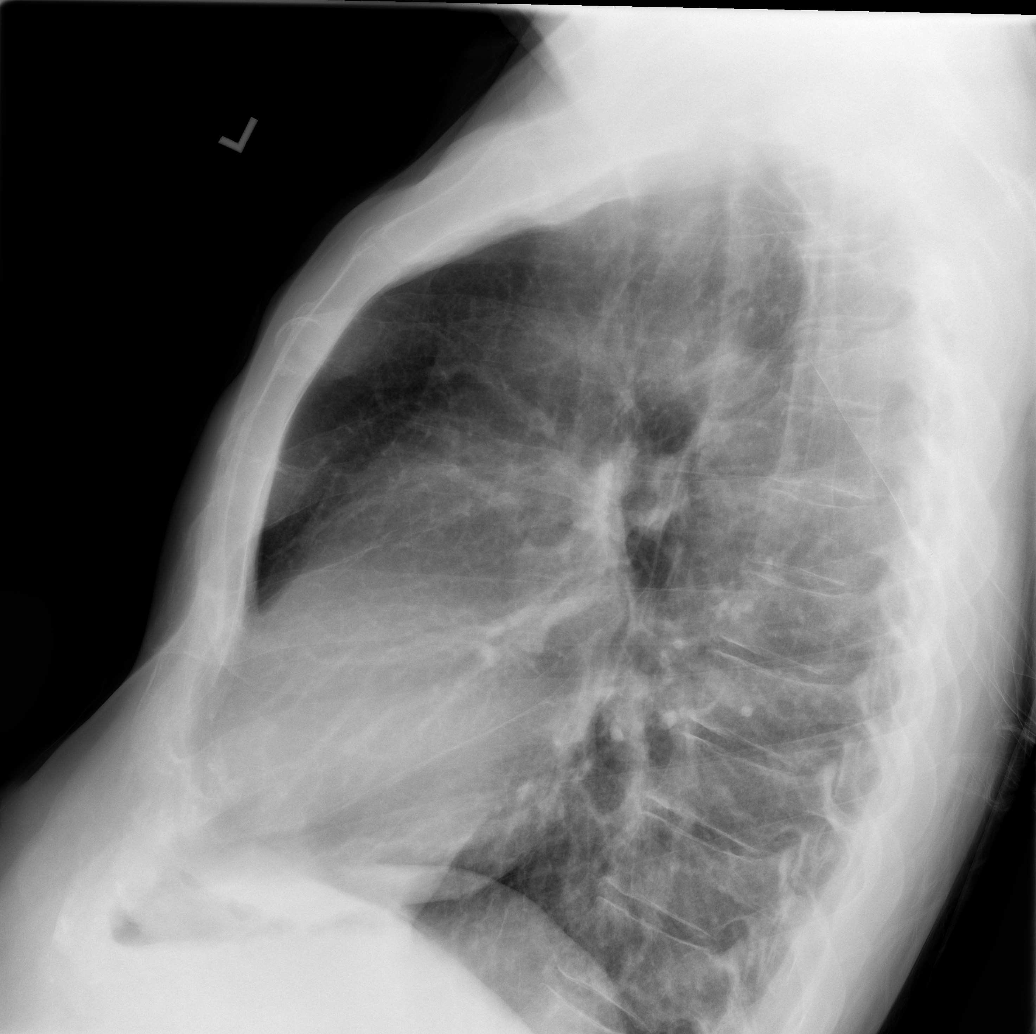

[w chest lat (2 of 2)]
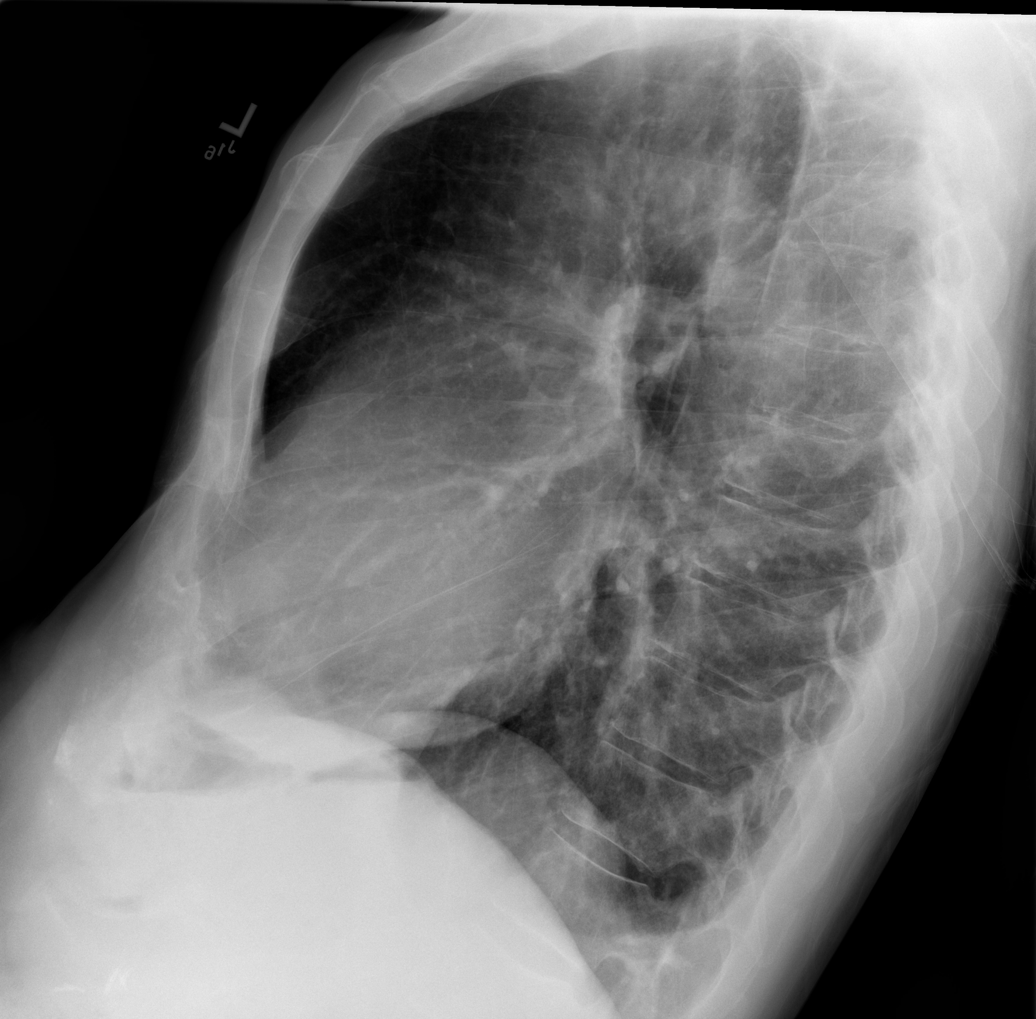

[view not recorded (1 of 2)]
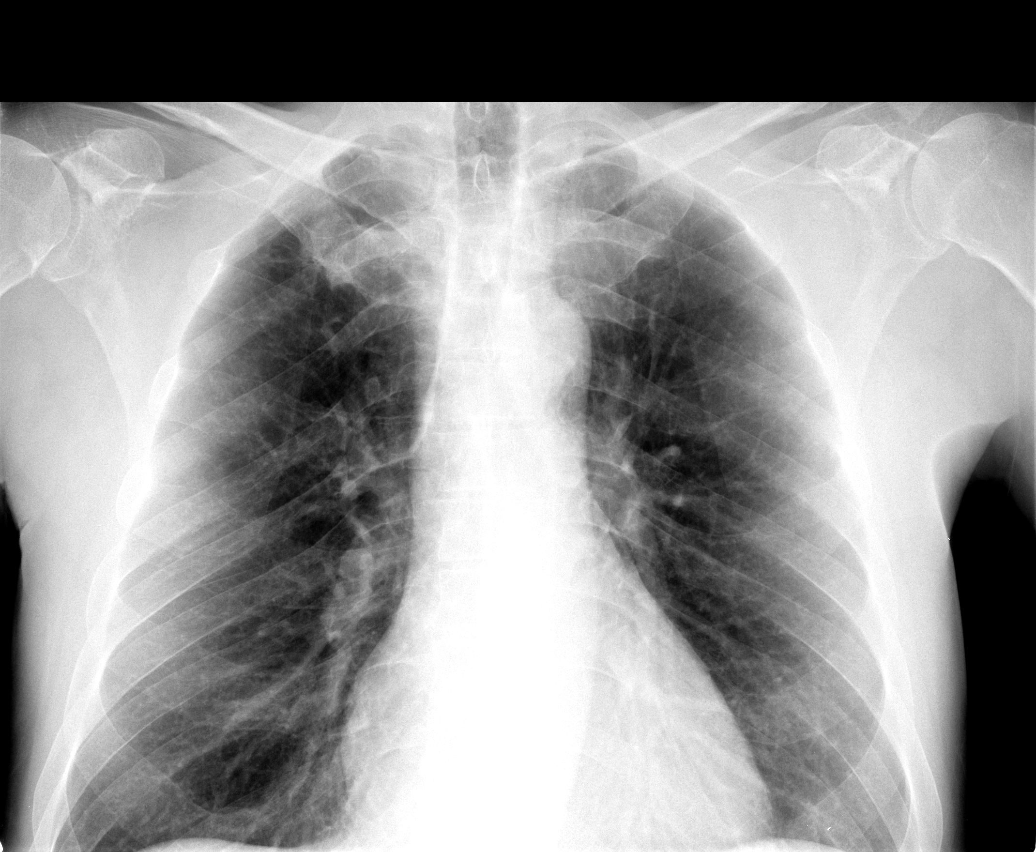

[view not recorded (2 of 2)]
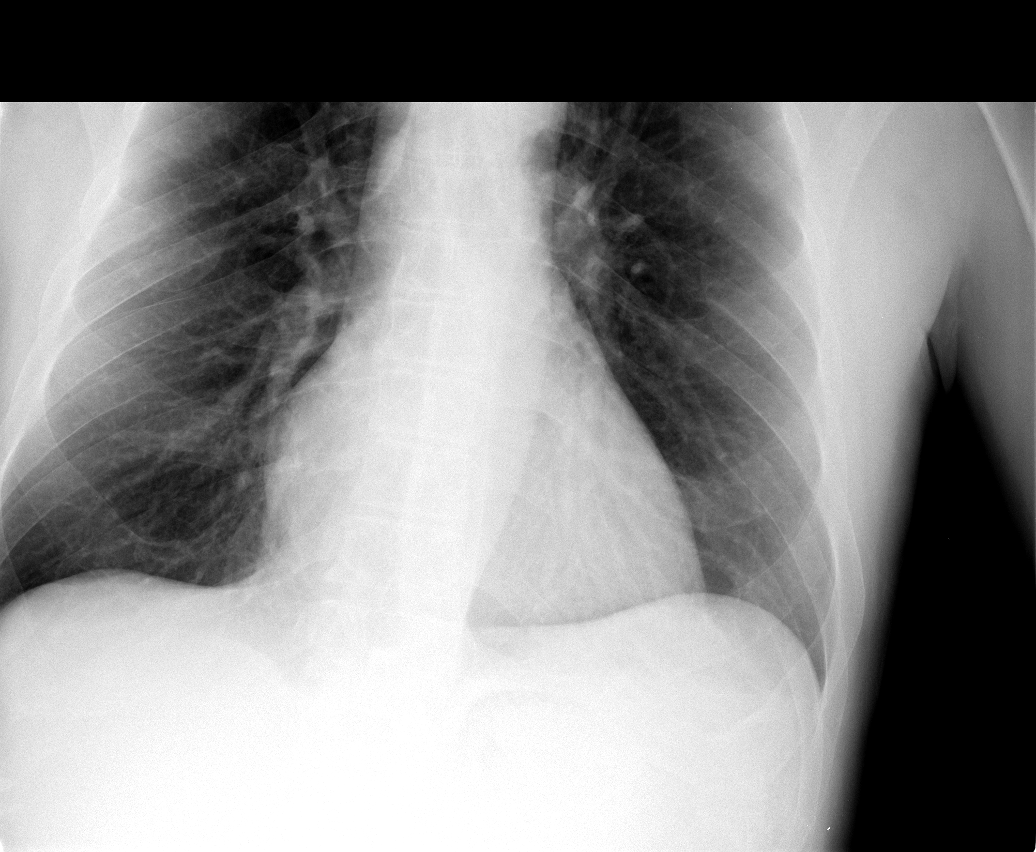

[4 of 4 positions shown; findings below may reference images not displayed]

FINDINGS: Heart size is mildly enlarged.

There is no pleural effusion or pulmonary edema.

Interstitial change consistent with COPD/emphysema

There is no airspace consolidation
IMPRESSION: 1.  No active cardiopulmonary disease
2.  COPD

## 2009-11-20 ENCOUNTER — Emergency Department: Payer: Self-pay | Admitting: Emergency Medicine

## 2009-11-30 ENCOUNTER — Ambulatory Visit: Payer: Self-pay | Admitting: Unknown Physician Specialty

## 2010-11-05 LAB — BASIC METABOLIC PANEL
BUN: 11 mg/dL (ref 6–23)
BUN: 9 mg/dL (ref 6–23)
CO2: 25 mEq/L (ref 19–32)
CO2: 28 mEq/L (ref 19–32)
Calcium: 8.5 mg/dL (ref 8.4–10.5)
Calcium: 8.9 mg/dL (ref 8.4–10.5)
Chloride: 105 mEq/L (ref 96–112)
Chloride: 108 mEq/L (ref 96–112)
Creatinine, Ser: 0.83 mg/dL (ref 0.4–1.5)
Creatinine, Ser: 0.84 mg/dL (ref 0.4–1.5)
GFR calc Af Amer: >60 mL/min (ref >60)
GFR calc Af Amer: >60 mL/min (ref >60)
GFR calc non Af Amer: >60 mL/min (ref >60)
GFR calc non Af Amer: >60 mL/min (ref >60)
Glucose, Bld: 114 mg/dL — ABNORMAL HIGH (ref 70–99)
Glucose, Bld: 121 mg/dL — ABNORMAL HIGH (ref 70–99)
Potassium: 4.1 mEq/L (ref 3.5–5.1)
Potassium: 4.2 mEq/L (ref 3.5–5.1)
Sodium: 138 mEq/L (ref 135–145)
Sodium: 139 mEq/L (ref 135–145)

## 2010-11-05 LAB — CBC
HCT: 25.7 % — ABNORMAL LOW (ref 39.0–52.0)
HCT: 29.1 % — ABNORMAL LOW (ref 39.0–52.0)
HCT: 29.4 % — ABNORMAL LOW (ref 39.0–52.0)
HCT: 34.5 % — ABNORMAL LOW (ref 39.0–52.0)
Hemoglobin: 11.6 g/dL — ABNORMAL LOW (ref 13.0–17.0)
Hemoglobin: 8.7 g/dL — ABNORMAL LOW (ref 13.0–17.0)
Hemoglobin: 9.6 g/dL — ABNORMAL LOW (ref 13.0–17.0)
Hemoglobin: 9.6 g/dL — ABNORMAL LOW (ref 13.0–17.0)
MCHC: 32.7 g/dL (ref 30.0–36.0)
MCHC: 33.1 g/dL (ref 30.0–36.0)
MCHC: 33.5 g/dL (ref 30.0–36.0)
MCHC: 34 g/dL (ref 30.0–36.0)
MCV: 89.8 fL (ref 78.0–100.0)
MCV: 89.8 fL (ref 78.0–100.0)
MCV: 90.5 fL (ref 78.0–100.0)
MCV: 91.5 fL (ref 78.0–100.0)
Platelets: 215 10*3/uL (ref 150–400)
Platelets: 242 10*3/uL (ref 150–400)
Platelets: 254 10*3/uL (ref 150–400)
Platelets: 336 10*3/uL (ref 150–400)
RBC: 2.86 MIL/uL — ABNORMAL LOW (ref 4.22–5.81)
RBC: 3.21 MIL/uL — ABNORMAL LOW (ref 4.22–5.81)
RBC: 3.22 MIL/uL — ABNORMAL LOW (ref 4.22–5.81)
RBC: 3.84 MIL/uL — ABNORMAL LOW (ref 4.22–5.81)
RDW: 14.6 % (ref 11.5–15.5)
RDW: 15.1 % (ref 11.5–15.5)
RDW: 15.2 % (ref 11.5–15.5)
RDW: 15.3 % (ref 11.5–15.5)
WBC: 10.8 10*3/uL — ABNORMAL HIGH (ref 4.0–10.5)
WBC: 12.9 10*3/uL — ABNORMAL HIGH (ref 4.0–10.5)
WBC: 7.5 10*3/uL (ref 4.0–10.5)
WBC: 9.8 10*3/uL (ref 4.0–10.5)

## 2010-11-05 LAB — DIFFERENTIAL
Basophils Absolute: 0 10*3/uL (ref 0.0–0.1)
Basophils Relative: 0 % (ref 0–1)
Eosinophils Absolute: 0.1 10*3/uL (ref 0.0–0.7)
Eosinophils Relative: 1 % (ref 0–5)
Lymphocytes Relative: 12 % (ref 12–46)
Lymphs Abs: 1.6 10*3/uL (ref 0.7–4.0)
Monocytes Absolute: 1 10*3/uL (ref 0.1–1.0)
Monocytes Relative: 8 % (ref 3–12)
Neutro Abs: 10.2 10*3/uL — ABNORMAL HIGH (ref 1.7–7.7)
Neutrophils Relative %: 79 % — ABNORMAL HIGH (ref 43–77)

## 2010-12-03 NOTE — Op Note (Signed)
NAME:  Jared [name], Jared [name] NO.:  [id number]   MEDICAL RECORD NO.:  [id number]          PATIENT TYPE:  INP   LOCATION:  5030                         FACILITY:  [facility]   PHYSICIAN:  Vanita Panda. Magnus Ivan, M.D.DATE OF BIRTH:  [birth year]   DATE OF PROCEDURE:  08/21/2008  DATE OF DISCHARGE:                               OPERATIVE REPORT   PREOPERATIVE DIAGNOSIS:  Right complex intertrochanteric femur/hip  fracture.   POSTOPERATIVE DIAGNOSIS:  Right complex intertrochanteric femur/hip  fracture.   PROCEDURE:  Intramedullary hip screw placement, right hip.   IMPLANTS:  [employer] intramedullary hip screw with 10 x 40  intramedullary rod, 110-mm lag screw, 90-mm compression screw.   FINDINGS:  No gross pathologic aspects of fracture, reaming sent for  permanent pathology specimen.   SURGEON:  Vanita Panda. Magnus Ivan, MD   ANESTHESIA:  General.   BLOOD LOSS:  200 mL.   ANTIBIOTICS:  1 gram IV Ancef.   COMPLICATIONS:  None.   INDICATIONS:  Briefly, Jared [name] is a 55 year old male who slipped on  the ice today in the [employer] parking lot.  He fell directly on his  right hip sustaining injury to this hip.  He was brought to the [facility] Emergency Room by EMS and x-rays were obtained, who has found to  have a complex intertrochanteric hip fracture.  There was no pathologic  aspect of this fracture that I could see on plain films.  He did have  recent surgery in December for some type of pathologic process of his  pancreas.  I recommended that he undergo intramedullary hip screw  placement to stabilize the fracture with sending the reamings for  pathology.  The risks and benefits of the surgery was explained to him  in length and he and his wife agreed to proceed with surgery.   PROCEDURE DESCRIPTION:  After informed consent was obtained, the  appropriate right hip was marked, he was brought to the operating room,  placed supine on the operating table.   General anesthesia was then  obtained.  He was placed on the fracture table with the peroneal post  protecting his peroneal area with appropriate padding.  His right  operative field was placed in-line skeletal traction with a traction  boot and the left nonoperative hip was flexed and abducted out of the  field and placed in a stirrup.  There was appropriate positioning and  padding around this.  The bed was raised and under direct fluoroscopic  guidance, I could assess the fracture.  With internal rotation,  traction, and adduction, I was able to get the fracture anatomically  reduced under direct fluoroscopic guidance.  We then prepped the right  hip with DuraPrep and sterile drapes, which included a sterile shower  curtain.  A time-out was called and we identified the correct patient  and correct right hip.   I then made an incision approximately 3 fingerbreadths proximal to the  greater trochanter.  This was a small incision.  I carried this down to  the deep tissues to the tip of the greater trochanter.  I  could feel the  __________ with my finger visualize it under direct fluoroscopic  guidance with the C-arm unit.  I then placed the guide pins in the tip  of the greater trochanter down to the level of lesser trochanter.  Using  initiating reamer and soft tissue protection guide, I then overreamed  this with a 17-mm reamer to open up the femoral canal.  The reamings of  this and the contents were sent to Pathology for permanent ID.  The bone  did feel hard and again there was no pathologic component of this.  Of  note, prior to making an incision under direct fluoroscopy, I did place  several intramedullary nail measuring 10 x 40 on the leg with it left in  the sterile package to select my size.  I then hooked the outrigger  guide to the sterile nail and I passed the intramedullary nail in a  retrograde fashion down the femoral shaft without complications and  without needing to  ream the shaft.  Using the outrigger guide, a  separate small stab incision was then made a little bit more distal down  the thigh.  A guide pin was then inserted through this traversing  through the lateral cortex of the femur traversing the intertrochanteric  area and femoral neck into the head and a near center-center position.  I took a measurement of this and selected the lag screw to be 110 mm in  length.  I then drilled to this level and tapped due to the hard bone.  I then placed a 110-mm lag screw without complications.  I then let  traction off the bed and placed a 19-mm compression screw.  I then put  the hip through range of motion and appeared to be stable.  Using the  fluoroscopy from a direct lateral position all the way to AP position, I  could see the head was in a near center-center position with no  penetration of the subcortical bone or the femoral head into the joint.  I did not place any distal interlocks due to the stable nature of the  fracture.  I then copiously irrigated both small wounds and closed the  deep tissue with 0 Vicryl followed by 2-0 Vicryl in subcutaneous tissue  and interrupted staples on the skin.  Xeroform followed by well-padded  sterile dressing was applied.  The patient was then taken off the  fracture table, awakened, extubated, and taken to the recovery room in  stable condition.  All final counts were correct and there were no  complications noted.      Vanita Panda. Magnus Ivan, M.D.  Electronically Signed     CYB/MEDQ  D:  08/21/2008  T:  08/22/2008  Job:  [id number]

## 2010-12-06 NOTE — Discharge Summary (Signed)
NAME:  Jared [name], Jared [name] NO.:  [id number]   MEDICAL RECORD NO.:  [id number]          PATIENT TYPE:  INP   LOCATION:  5030                         FACILITY:  [facility]   PHYSICIAN:  Vanita Panda. Magnus Ivan, M.D.DATE OF BIRTH:  1956/01/27   DATE OF ADMISSION:  08/21/2008  DATE OF DISCHARGE:  08/28/2008                               DISCHARGE SUMMARY   ADMITTING DIAGNOSIS:  Right complex intertrochanteric hip/femur  fracture.   DISCHARGE DIAGNOSIS:  Right complex intertrochanteric femur/hip  fracture.   PROCEDURE:  Open reduction and internal fixation of right hip fracture  using cephalomedullary hip screw.   HOSPITAL COURSE:  Briefly, [name] is a 55 year old gentleman   Dictation ended at this point.      Vanita Panda. Magnus Ivan, M.D.  Electronically Signed     CYB/MEDQ  D:  10/03/2008  T:  10/04/2008  Job:  [id number]

## 2010-12-06 NOTE — Discharge Summary (Signed)
NAME:  Jared [name], Jared [name] NO.:  [id number]   MEDICAL RECORD NO.:  [id number]          PATIENT TYPE:  INP   LOCATION:  5030                         FACILITY:  [facility]   PHYSICIAN:  Vanita Panda. Magnus Ivan, M.D.DATE OF BIRTH:  October 25, 1955   DATE OF ADMISSION:  08/21/2008  DATE OF DISCHARGE:  08/28/2008                               DISCHARGE SUMMARY   ADMITTING DIAGNOSIS:  Right complex intertrochanteric femur/hip  fracture.   DISCHARGE DIAGNOSIS:  Right complex intertrochanteric femur/hip  fracture.   PROCEDURE:  Open reduction and internal fixation of right complex  intertrochanteric hip fracture on date of admission using  cephalomedullary hip screw.   HOSPITAL COURSE:  Briefly, [name] is a 55 year old gentleman who  slipped in a parking lot on ice on the day of admission.  He was brought  via EMS to the [facility] Emergency Room and had exquisite right hip  pain.  X-rays were consistent with an intertrochanteric hip fracture.  He had a medical history significant for pancreatic cancer excision that  this did not appear to be a pathological fracture.  He was taken to the  operating room on the day of admission where he underwent intramedullary  hip screw placement without complications.  For detailed description of  operation, please refer part of the operative note in the patient's  medical record.  Postoperatively, he was admitted to regular orthopedic  floor bed and began to work with physical therapy and occupational  therapy on activities of daily living as well as weightbearing as  tolerated.  He did have a slow mobility during his hospitalization due  to just generalized weakness from his pancreatic surgery at [facility] in  December.  He did have a episode of urinary retention as well.  By the  day of discharge, he was voiding better and it was felt that he had made  progress enough with therapy to be able to be discharged home with close  home health, follow  up with home health therapy, as well as durable  medical equipment.   DISPOSITION:  To home.   DISCHARGE INSTRUCTIONS:  While he is at home, he will continue to weight  bear as tolerated with his hip with home health therapy coming in to  work with his gait training, balance, and coordination.  He will  continue all his prehospitalization medications, and he was discharged  on Percocet and Robaxin as well.  Followup established, will be in my  office in 2 weeks after discharge.      Vanita Panda. Magnus Ivan, M.D.  Electronically Signed     CYB/MEDQ  D:  10/03/2008  T:  10/04/2008  Job:  [id number]

## 2012-04-02 ENCOUNTER — Ambulatory Visit: Payer: Self-pay | Admitting: Unknown Physician Specialty

## 2012-04-05 LAB — PATHOLOGY REPORT

## 2012-08-11 ENCOUNTER — Ambulatory Visit: Payer: Self-pay

## 2012-08-11 IMAGING — CR DG ABDOMEN 1V
1 series · 2 of 2 positions shown · non-contrast
Comparison: none

REASON FOR EXAM: kindey stone
COMMENTS:

PROCEDURE:     DXR - DXR KIDNEY URETER BLADDER  - [DATE]  [DATE]
RESULT:     Comparison: None.

[Series 1: supine ap · 0.17mm/px · 2 of 2 slices shown]
[im 1/2]
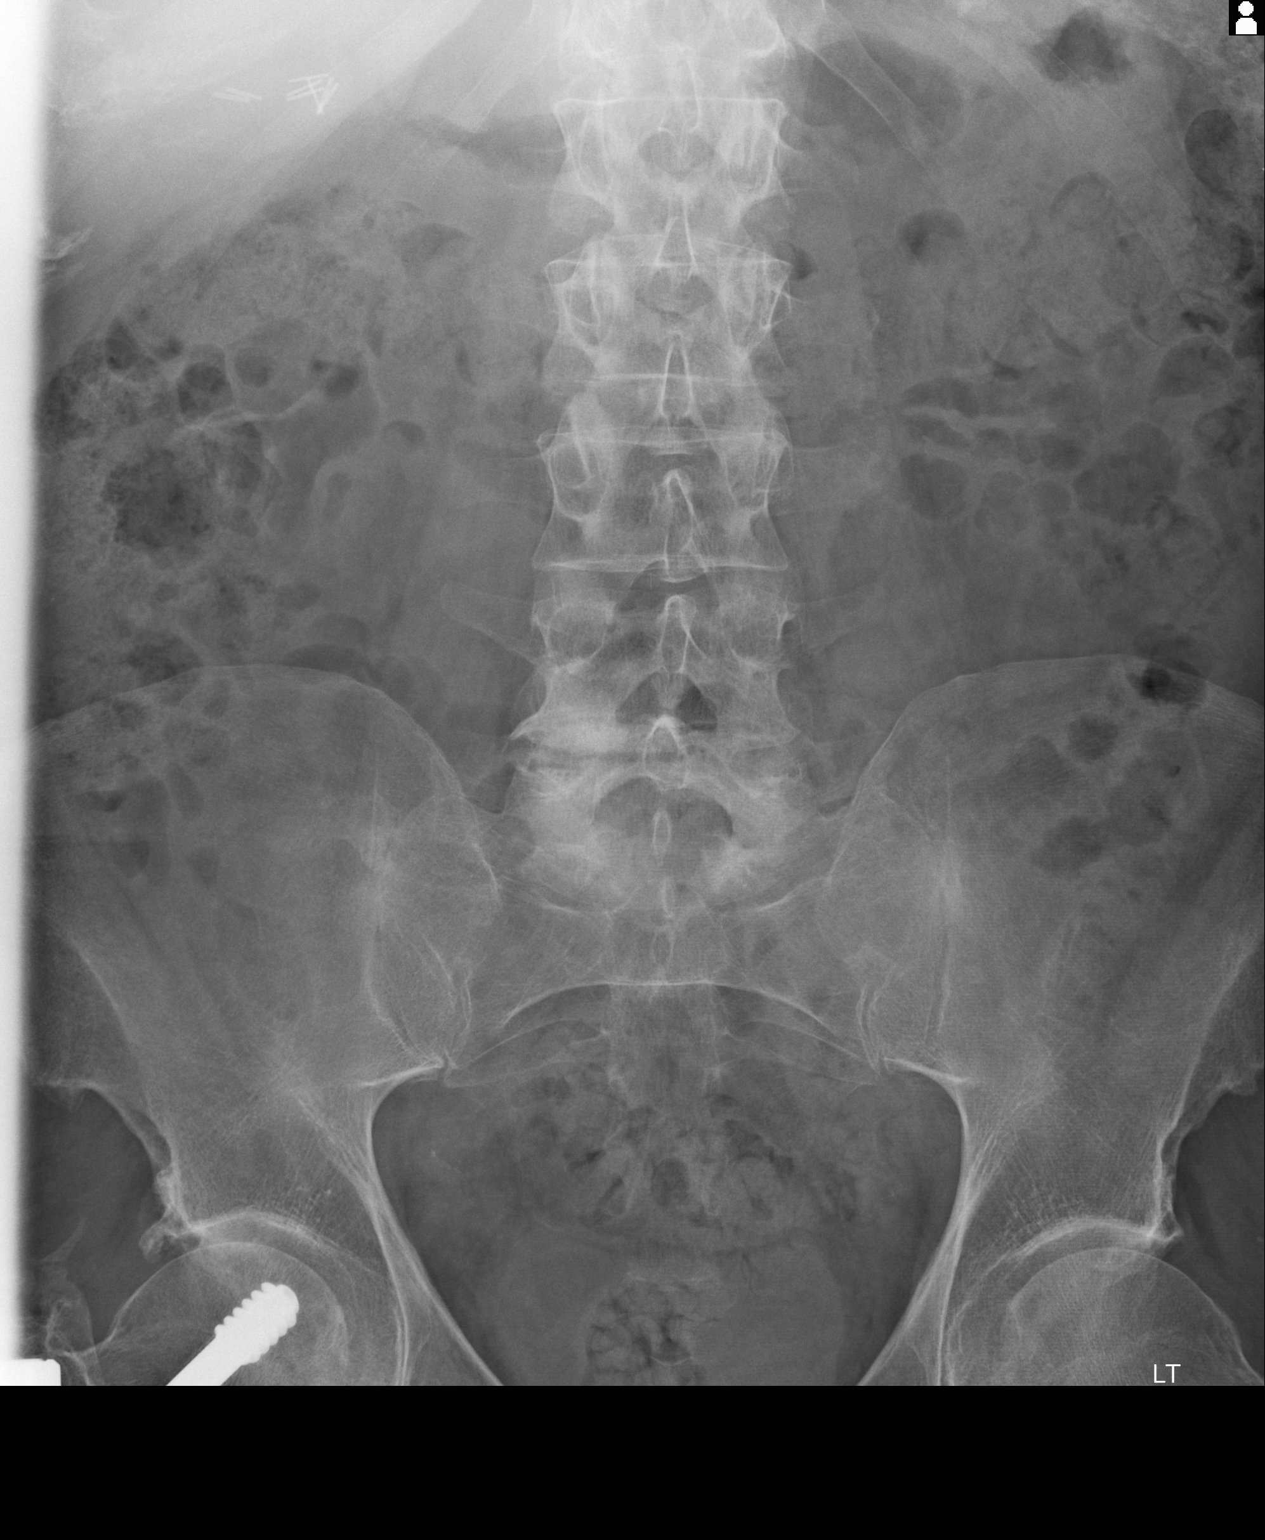
[im 2/2]
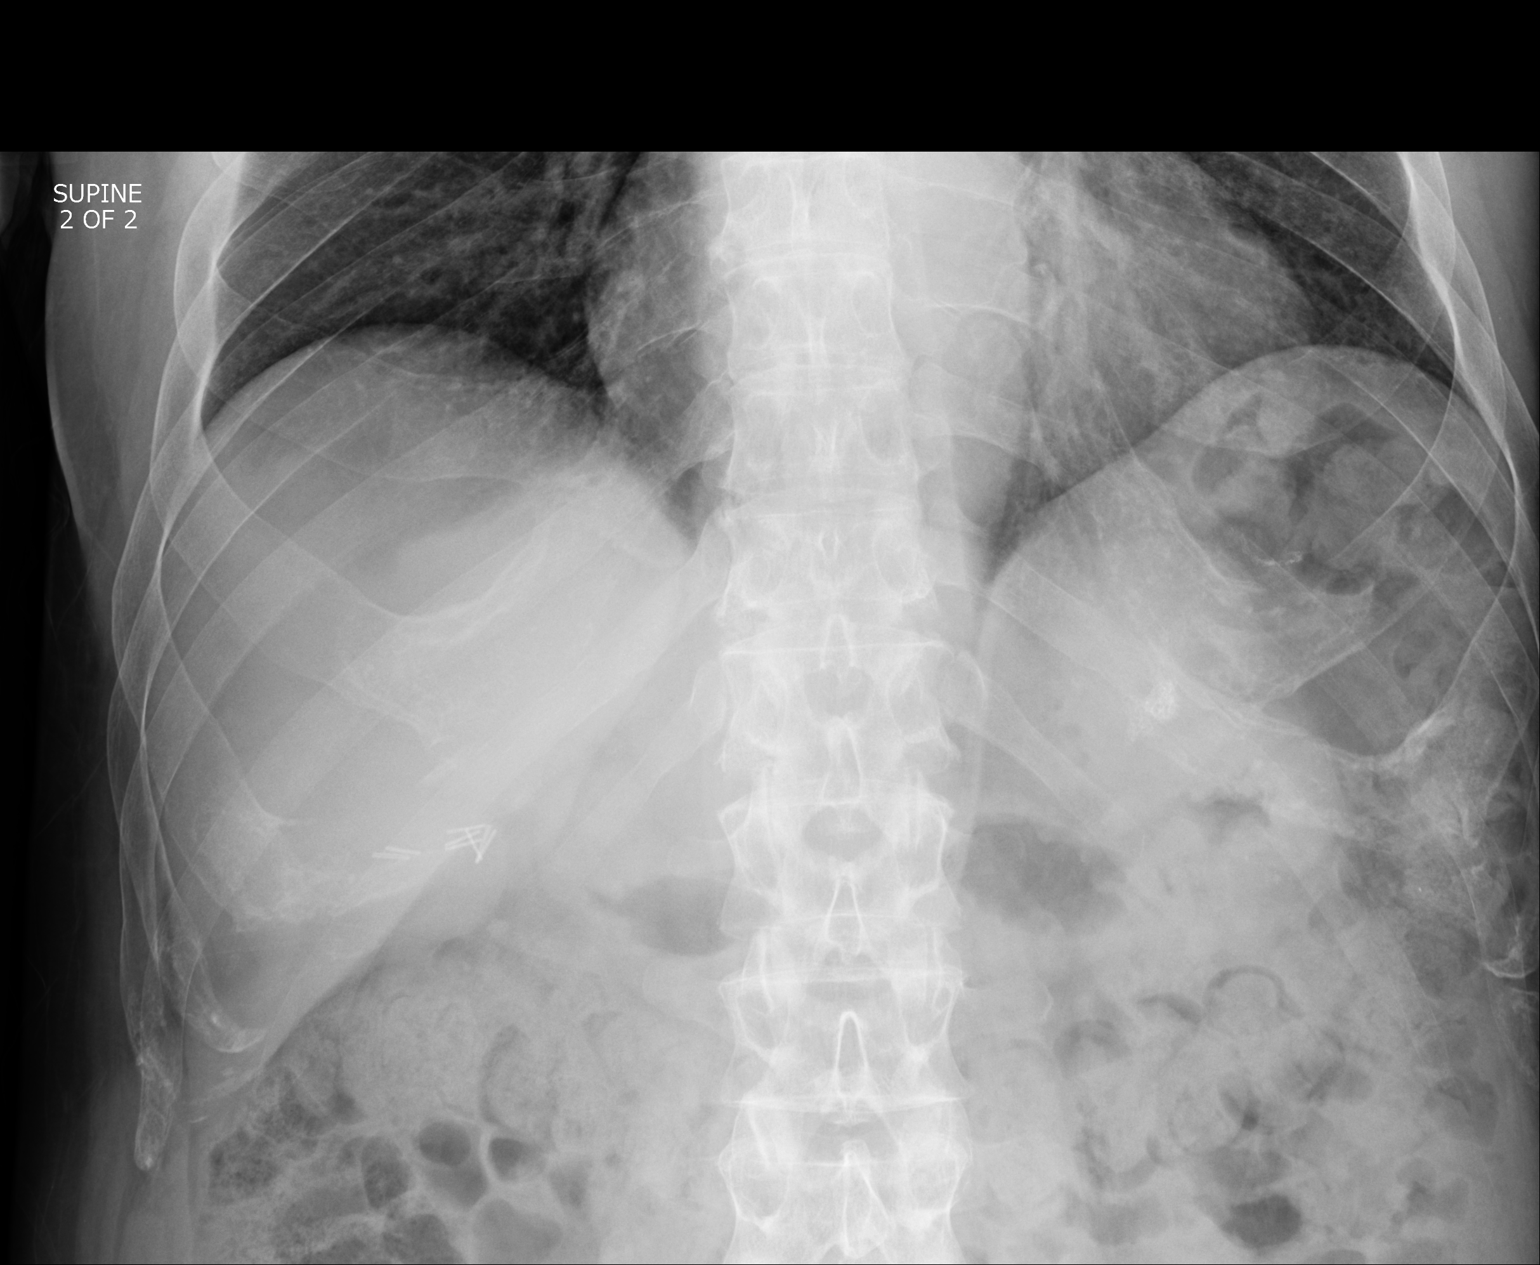

[2 of 2 positions shown; findings below may reference images not displayed]

FINDINGS: Evaluation for renal calculi is limited by overlying stool and bowel gas. No
definite renal calculi seen. Tiny calcific densities in the pelvis likely
represent phleboliths. Nonobstructed bowel gas pattern. Internal fixation
hardware is incompletely visualized in the right hip. Surgical clips are
seen from prior cholecystectomy.
IMPRESSION: No definite nephrolithiasis.

[REDACTED]

## 2013-09-21 ENCOUNTER — Ambulatory Visit: Payer: Self-pay | Admitting: Urology

## 2013-09-21 IMAGING — CT CT ABDOMEN AND PELVIS WITHOUT AND WITH CONTRAST
2 of 9 series · 13 of 46 positions shown, 15 images · IV contrast (isovue)
Comparison: [DATE]

CLINICAL DATA: Flank pain. Hematuria. History describes pancreatic
carcinoma.

EXAM:
CT ABDOMEN AND PELVIS WITHOUT AND WITH CONTRAST
TECHNIQUE: Multidetector CT imaging of the abdomen and pelvis was performed
without contrast material in one or both body regions, followed by
contrast material(s) and further sections in one or both body
regions.
CONTRAST:  125 mm of Isovue 370 intravenous contrast.

[Series 2: routine abd pel without · axial · non-contrast · 0.86mm/px · z∈[-1055,-650]mm · 10 of 99 slices shown, 12 images]
[im 9/99  soft-tissue]
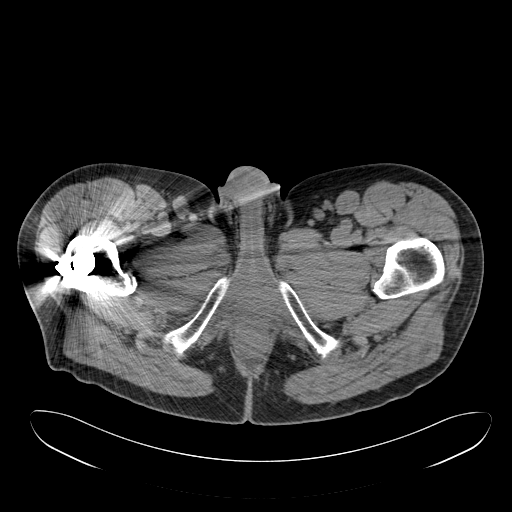
[im 9/99  bone]
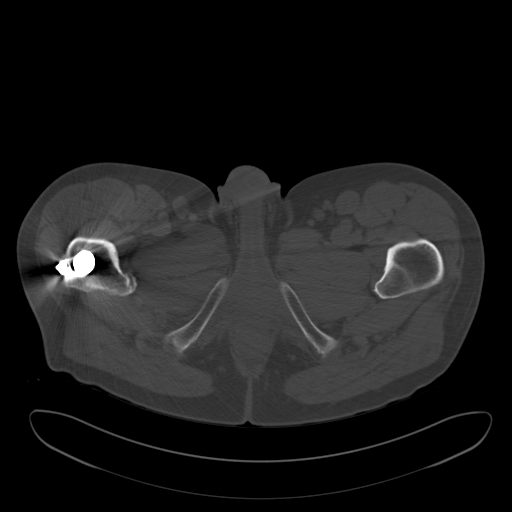
[im 18/99  soft-tissue]
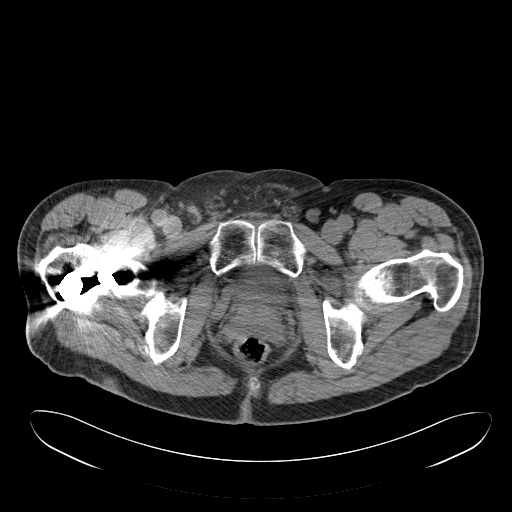
[im 27/99  soft-tissue]
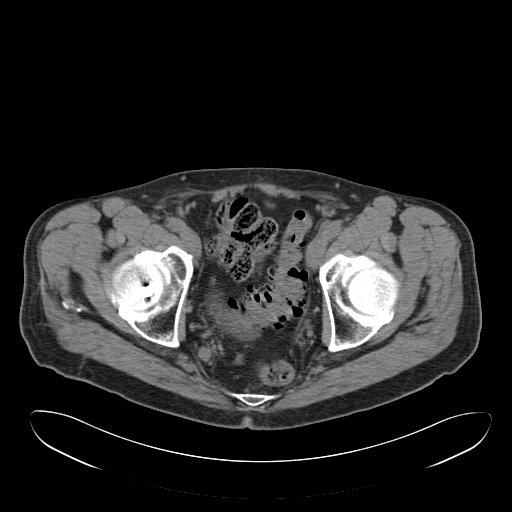
[im 36/99  soft-tissue]
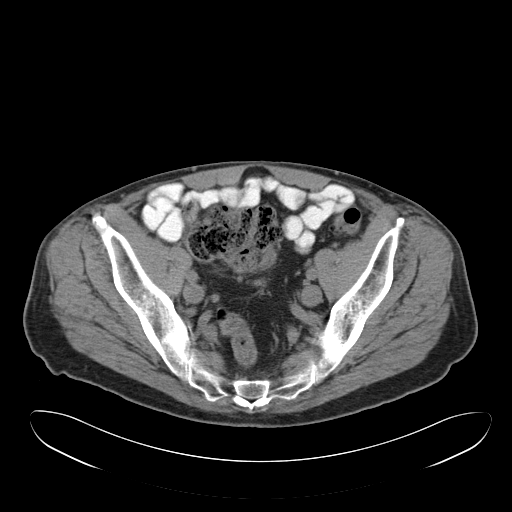
[im 45/99  soft-tissue]
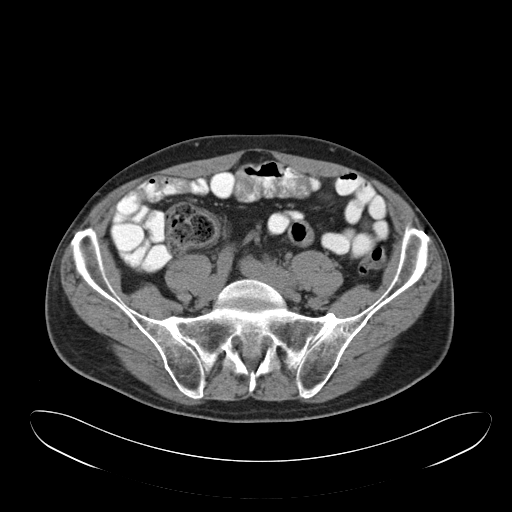
[im 54/99  soft-tissue]
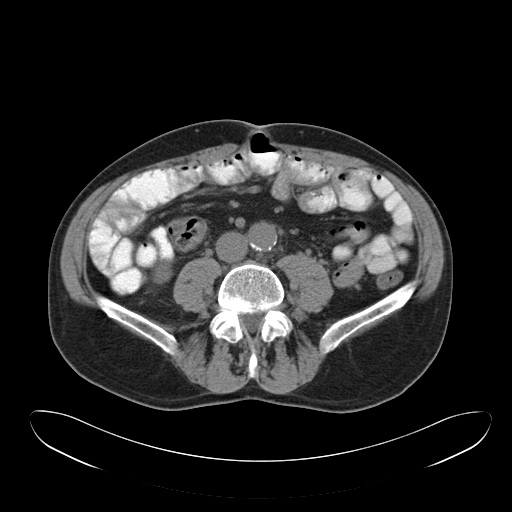
[im 63/99  soft-tissue]
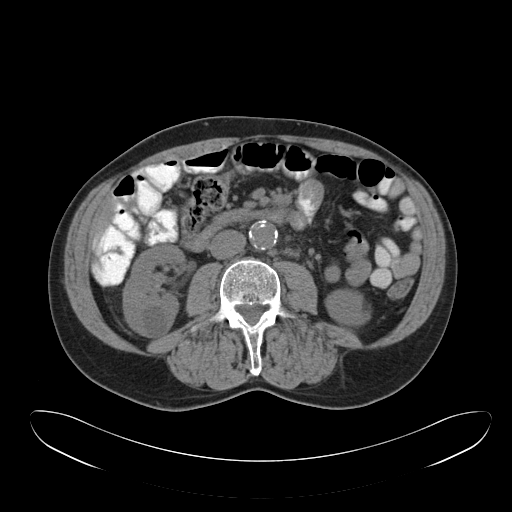
[im 72/99  soft-tissue]
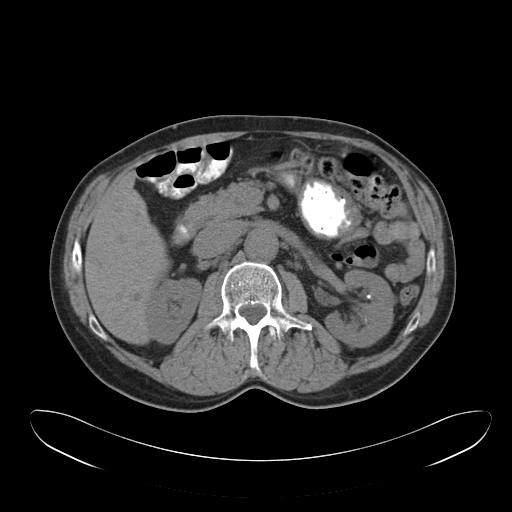
[im 81/99  soft-tissue]
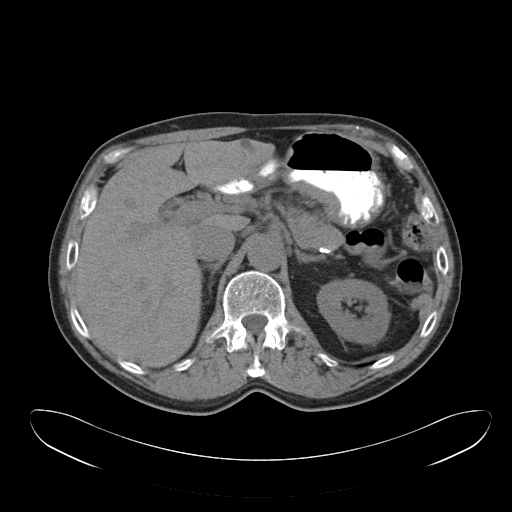
[im 81/99  bone]
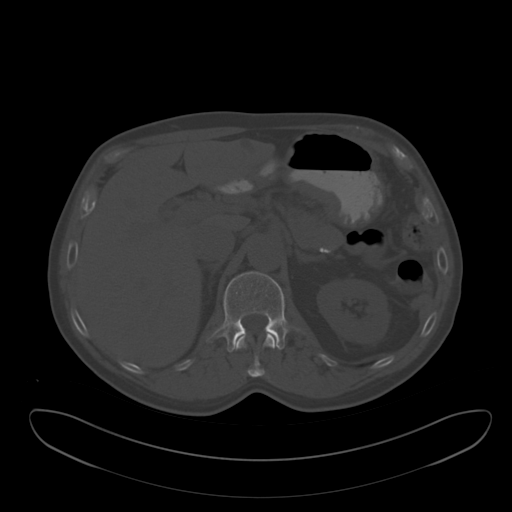
[im 90/99  soft-tissue]
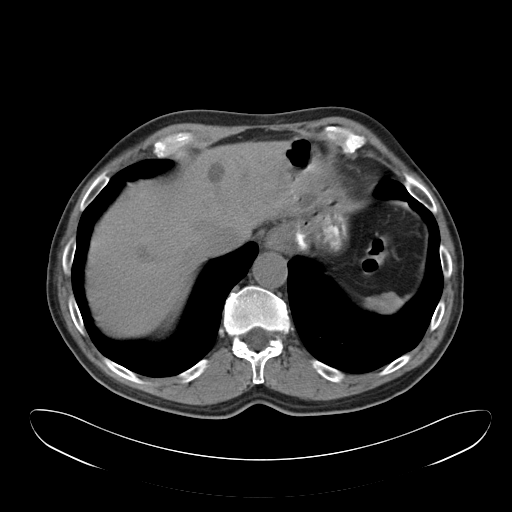

[Series 8: cor routine abd pel wo · coronal · 0.75mm/px · 3 of 136 slices shown]
[im 34/136  soft-tissue]
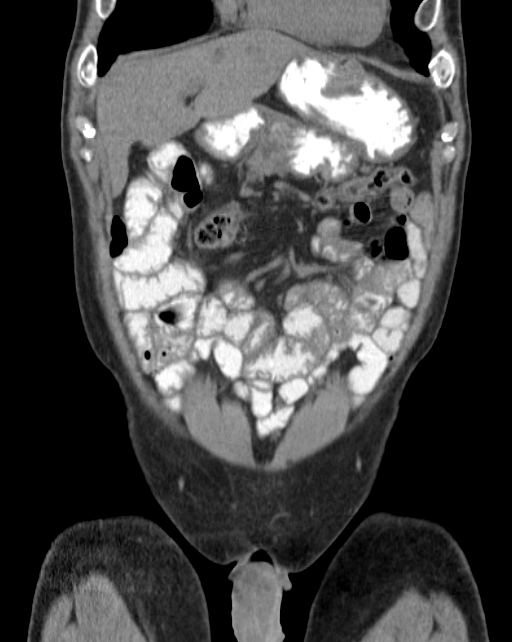
[im 68/136  soft-tissue]
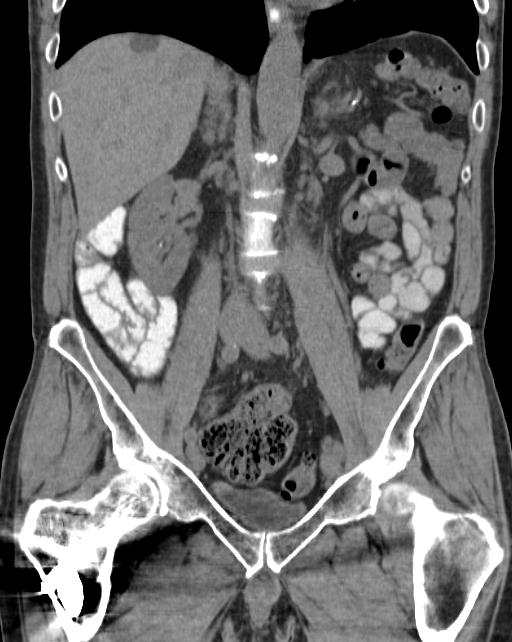
[im 102/136  soft-tissue]
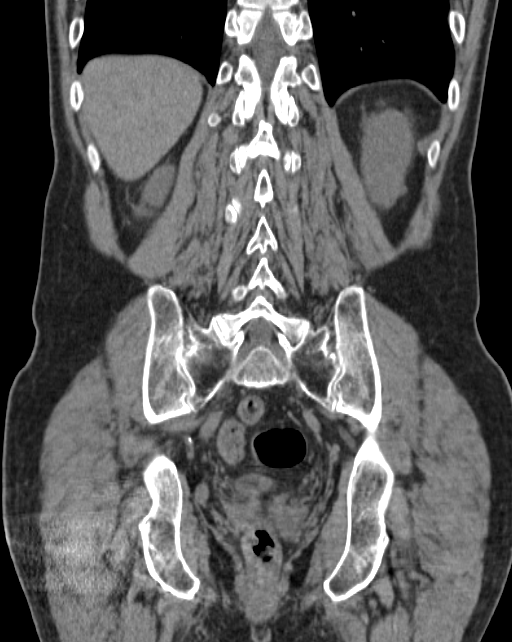

[13 of 46 positions shown; findings below may reference images not displayed]

FINDINGS: There are bilateral, nonobstructing intrarenal stones. There are no
ureteral stones or signs of obstructive uropathy. No hydronephrosis.

Both kidneys show multiple small low-density masses. The largest
arises from the posterior midpole the right kidney. This is a cysts.
The smaller lesions are also likely cysts but are too small to fully
characterize. Most of these show a mild increase in size from the
prior study. No other renal masses or lesions.

Normal ureters.  The bladder is unremarkable.

Minor reticular subsegmental atelectasis or scarring at the left
lateral lung base. Lung bases are otherwise clear.

Multiple low-density spleen lesions are noted which do not enhance.
These are relatively stable consistent with multiple hepatic cysts.
Liver is otherwise unremarkable.

Since the prior study, the spleen has been removed. A small amount
of residual splenic tissue is seen along the superior lateral left
para renal fascia.

The mass seen along the tail the pancreas has also been resected.
Surgical staples are noted along the resected margin of the
pancreatic tail. The remaining pancreas is unremarkable.

Status post cholecystectomy.  No bile duct dilation.

No adrenal masses.

There is a single mildly enlarged lymph node along the anterior
superior aspect of the gastrohepatic ligament. This was not evident
on the prior study. It measures 12 mm in short axis. No other
adenopathy.

Small fusiform infrarenal abdominal aortic aneurysm. This measures
27.5 mm x 20.4 mm. It previously measured 24.1 mm x 25.7 mm in the
same location.

No ascites.

Extensive colonic diverticulosis mostly along the sigmoid colon. No
diverticulitis. No bowel masses or wall thickening or inflammatory
changes. Small bowel is unremarkable. Normal appendix.

No osteoblastic or osteolytic lesions. Changes from pinning of a
previous right proximal femur fracture.
IMPRESSION: 1. No ureteral stones or evidence of obstructive uropathy.
2. No acute findings. No findings to explain flank pain or
hematuria.
3. Status post resection of the pancreatic tail mass and the spleen
since the prior study. There is some residual left upper quadrant
tissue consistent with residual splenic tissue no evidence of a
residual pancreatic mass.
4. Single mildly enlarged lymph node along the gastro hepatic
ligament measuring 12 mm. This is nonspecific but was not evident on
the prior exam.
5. Bilateral nonobstructing intrarenal stones. Bilateral low-density
renal lesions most likely all cysts. Largest of these is clearly a
cyst.
6. Multiple liver cysts.
7. Multiple other chronic findings including changes from a
cholecystectomy, colonic diverticulosis and changes from the ORIF of
a right hip fracture.
8. Small infrarenal abdominal aortic aneurysm has developed since
the prior study. It measures 2.8 cm.

## 2015-04-02 DIAGNOSIS — N200 Calculus of kidney: Secondary | ICD-10-CM | POA: Insufficient documentation

## 2015-09-07 DIAGNOSIS — E785 Hyperlipidemia, unspecified: Secondary | ICD-10-CM | POA: Insufficient documentation

## 2015-09-07 DIAGNOSIS — Z720 Tobacco use: Secondary | ICD-10-CM | POA: Insufficient documentation

## 2016-12-05 DIAGNOSIS — I499 Cardiac arrhythmia, unspecified: Secondary | ICD-10-CM | POA: Insufficient documentation

## 2016-12-05 DIAGNOSIS — Z86010 Personal history of colonic polyps: Secondary | ICD-10-CM | POA: Insufficient documentation

## 2017-02-20 ENCOUNTER — Ambulatory Visit: Payer: PRIVATE HEALTH INSURANCE | Attending: Neurology

## 2017-02-20 DIAGNOSIS — G4733 Obstructive sleep apnea (adult) (pediatric): Secondary | ICD-10-CM | POA: Diagnosis not present

## 2017-02-20 DIAGNOSIS — F5101 Primary insomnia: Secondary | ICD-10-CM | POA: Insufficient documentation

## 2017-02-20 DIAGNOSIS — R0681 Apnea, not elsewhere classified: Secondary | ICD-10-CM | POA: Diagnosis present

## 2017-02-20 DIAGNOSIS — G2581 Restless legs syndrome: Secondary | ICD-10-CM | POA: Diagnosis not present

## 2017-03-06 ENCOUNTER — Ambulatory Visit: Payer: PRIVATE HEALTH INSURANCE | Attending: Family Medicine

## 2017-03-06 DIAGNOSIS — F5101 Primary insomnia: Secondary | ICD-10-CM | POA: Diagnosis not present

## 2017-03-06 DIAGNOSIS — G4733 Obstructive sleep apnea (adult) (pediatric): Secondary | ICD-10-CM | POA: Diagnosis present

## 2017-03-06 DIAGNOSIS — G2581 Restless legs syndrome: Secondary | ICD-10-CM | POA: Insufficient documentation

## 2017-03-17 ENCOUNTER — Other Ambulatory Visit: Payer: Self-pay | Admitting: Nurse Practitioner

## 2017-03-17 DIAGNOSIS — R1310 Dysphagia, unspecified: Secondary | ICD-10-CM | POA: Insufficient documentation

## 2017-03-19 ENCOUNTER — Ambulatory Visit: Payer: PRIVATE HEALTH INSURANCE

## 2017-03-24 ENCOUNTER — Ambulatory Visit
Admission: RE | Admit: 2017-03-24 | Discharge: 2017-03-24 | Disposition: A | Discharge status: Home / Self Care | Payer: PRIVATE HEALTH INSURANCE | Source: Ambulatory Visit | Attending: Nurse Practitioner | Admitting: Nurse Practitioner

## 2017-03-24 DIAGNOSIS — R1310 Dysphagia, unspecified: Secondary | ICD-10-CM | POA: Diagnosis not present

## 2017-03-24 IMAGING — RF DG ESOPHAGUS
7 series · 12 of 12 positions shown · non-contrast
Comparison: None in PACs

CLINICAL DATA: Long-term dysphagia episodes to solids and pills
with worsening symptoms over the past 2 years. The sensation
reported is that of via being stuck at the level of the thyroid
cartilage.

EXAM:
ESOPHOGRAM / BARIUM SWALLOW / BARIUM TABLET STUDY
TECHNIQUE: Combined double contrast and single contrast examination performed
using effervescent crystals, thick barium liquid, and thin barium
liquid. The patient was observed with fluoroscopy swallowing a 13 mm
barium sulphate tablet.
FLUOROSCOPY TIME:  Fluoroscopy Time:  0 minutes, 54 seconds
Radiation Exposure Index (if provided by the fluoroscopic device):
694 micro Gy per meter square
Number of Acquired Spot Images: 6+ 1 cine loop.

[Series 1: fluoro_barium 2fps_bw · 0.17mm/px · 1 of 1 slices shown (1 of 7)]
[im 1/1]
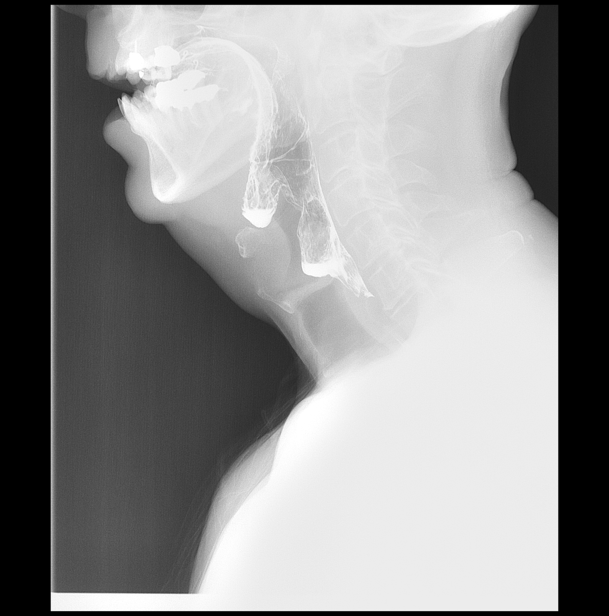

[Series 2: fluoro_barium 2fps_bw · 0.18mm/px · 2 of 2 frames shown (2 of 7)]
[frame 1/2]
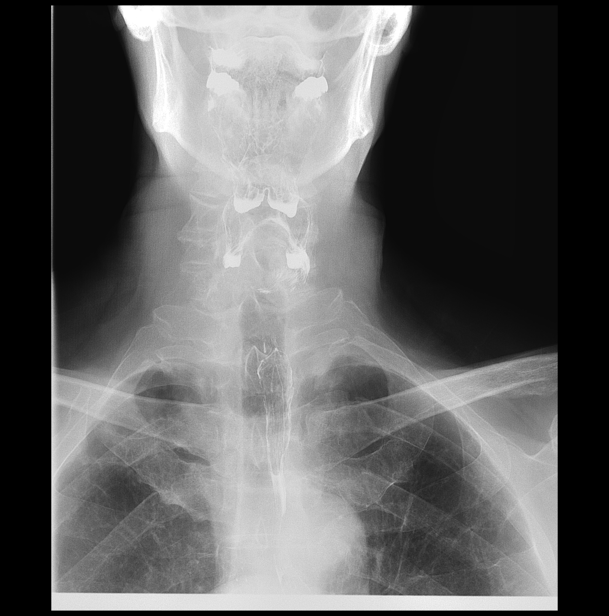
[frame 2/2]
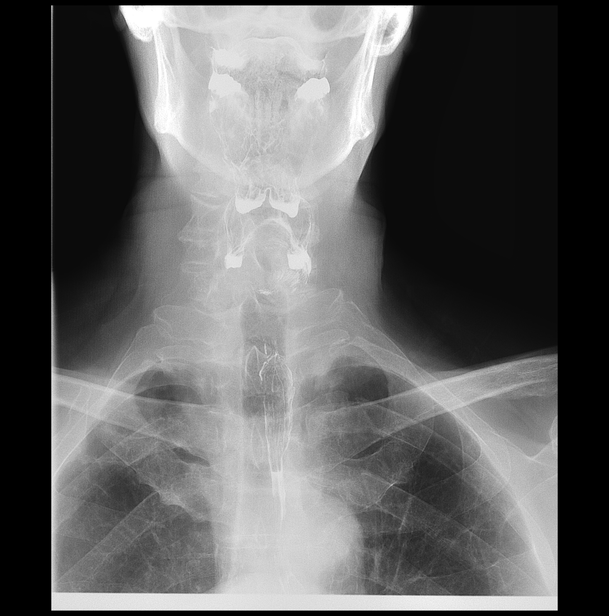

[Series 3: fluoro_barium 2fps_bw · 0.18mm/px · 2 of 2 frames shown (3 of 7)]
[frame 1/2]
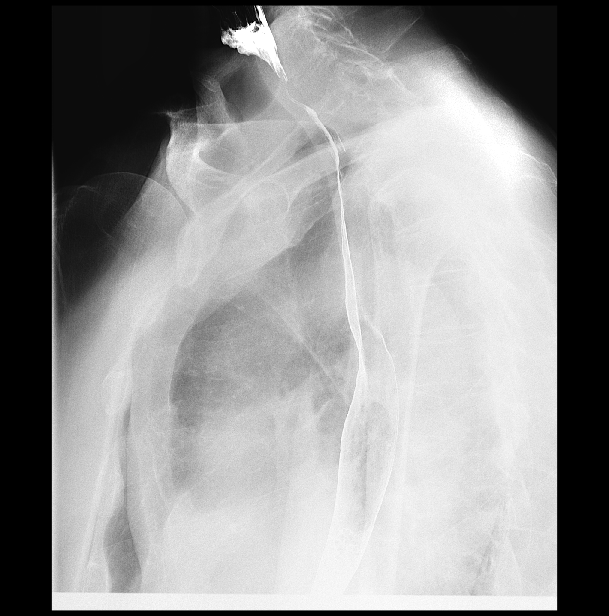
[frame 2/2]
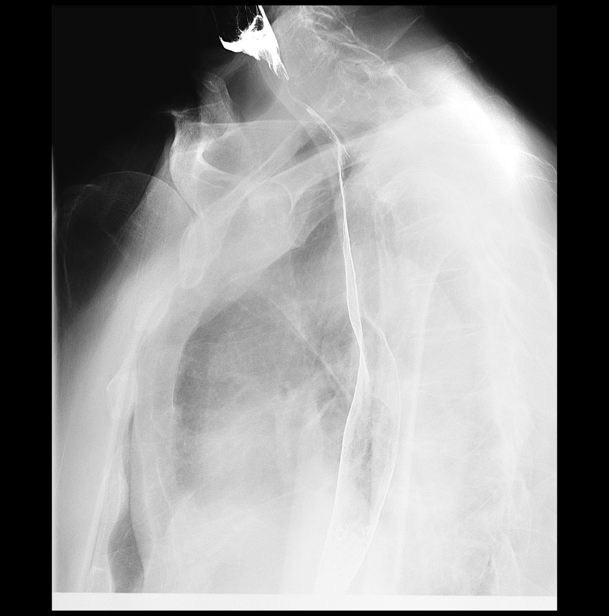

[Series 4: fluoro_barium 2fps_bw · 0.18mm/px · 1 of 1 slices shown (4 of 7)]
[im 1/1]
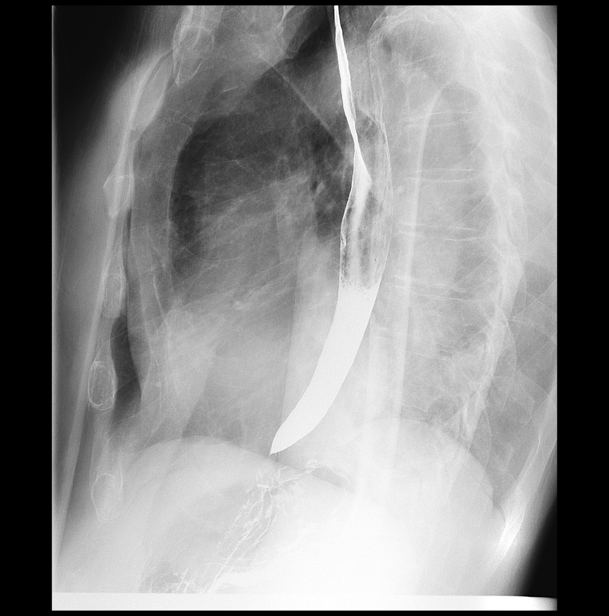

[Series 5: fluoro_barium 2fps_bw · 0.18mm/px · 1 of 1 slices shown (5 of 7)]
[im 1/1]
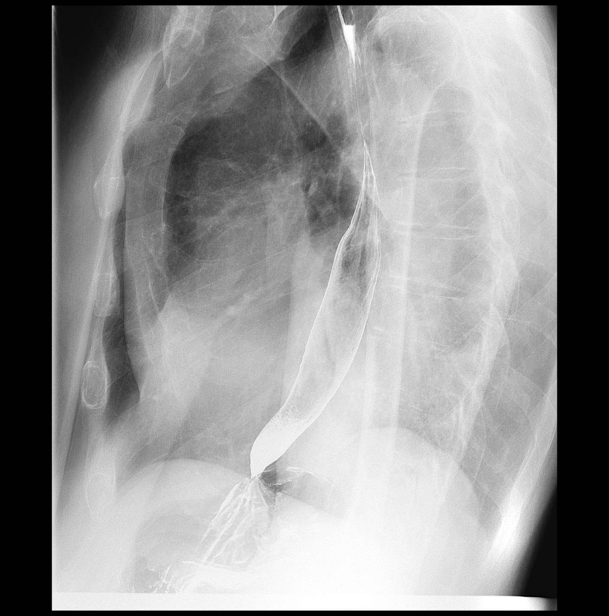

[Series 6: fluoro_barium 2fps_bw · 0.18mm/px · 1 of 1 slices shown (6 of 7)]
[im 1/1]
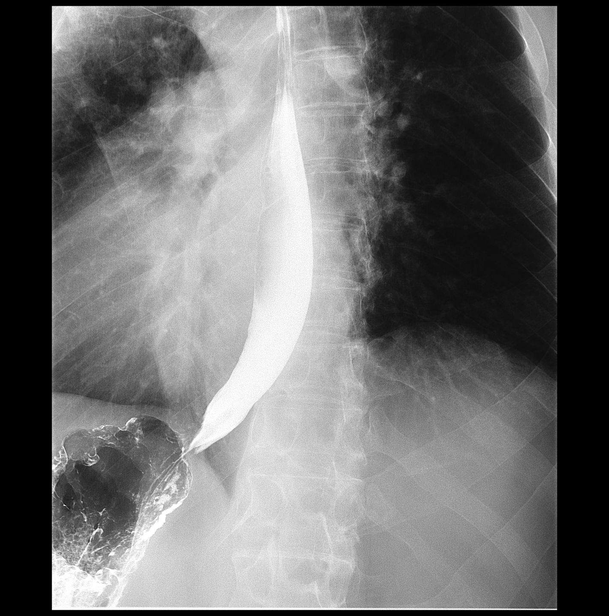

[Series 7: fluoro_barium 2fps_bw · 0.18mm/px · 4 of 20 frames shown (7 of 7)]
[frame 1/20]
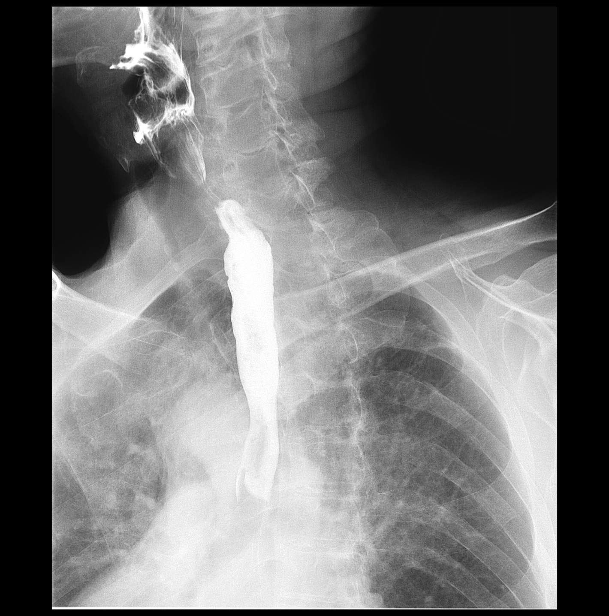
[frame 4/20]
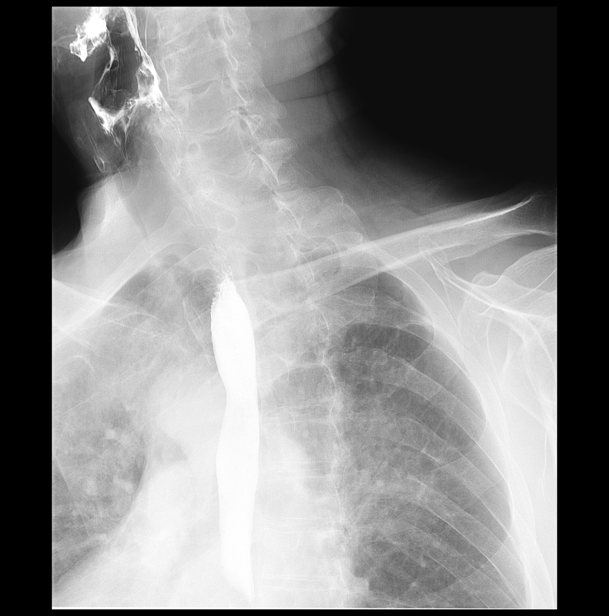
[frame 11/20]
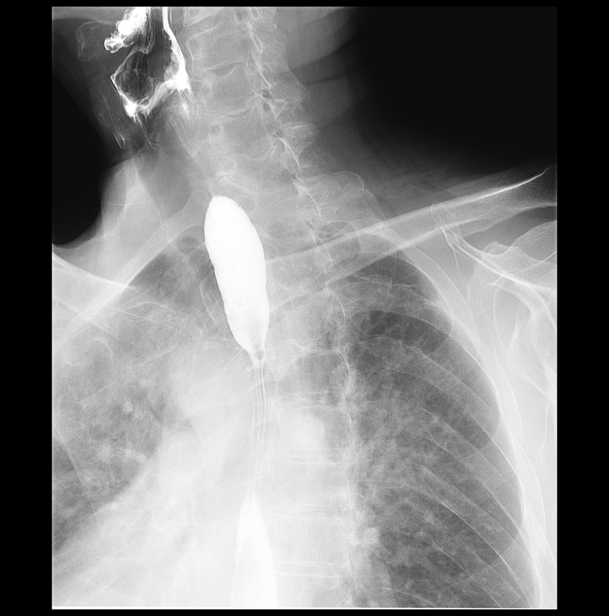
[frame 18/20]
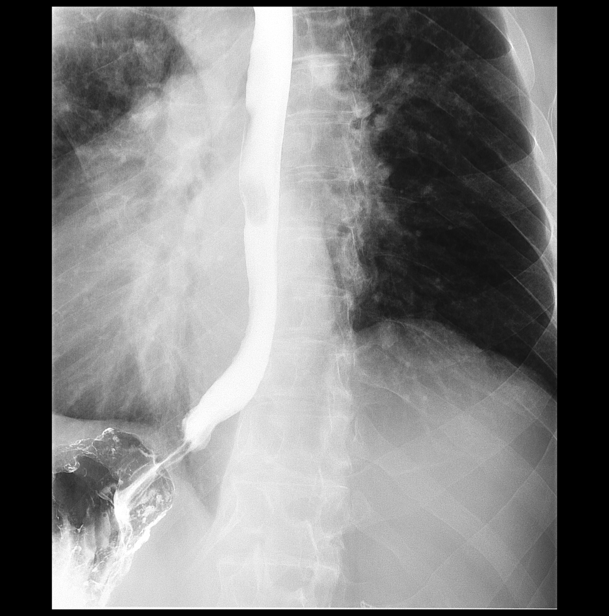

[12 of 12 positions shown; findings below may reference images not displayed]

FINDINGS: The patient ingested thick and thin barium and the gas-forming
crystals without difficulty. The hypopharynx distended well. There
was no laryngeal penetration of the barium. The cervical esophagus
also distended well. A mild cricopharyngeus muscle impression was
observed. The thoracic esophagus distended well. The mucosal pattern
was smooth. There was no fixed stricture. There was no hiatal
hernia. No reflux was demonstrated. The 12 mm barium pill passed
promptly from the mouth to the GE junction, hung transiently, and
then passed into the stomach.
IMPRESSION: Normal barium esophagram. Given the patient's symptoms and despite
only transient delay in passage of the 12 mm barium tablet,
consideration for performing upper endoscopy would be useful to
allow assessment of the esophageal mucosa and to judge whether
esophageal dilation would be indicated at this time.

## 2017-03-25 ENCOUNTER — Ambulatory Visit: Payer: PRIVATE HEALTH INSURANCE

## 2017-03-26 ENCOUNTER — Encounter: Payer: Self-pay | Admitting: *Deleted

## 2017-03-27 ENCOUNTER — Encounter
Admission: RE | Disposition: A | Discharge status: Home / Self Care | Payer: Self-pay | Source: Ambulatory Visit | Attending: Unknown Physician Specialty

## 2017-03-27 ENCOUNTER — Ambulatory Visit: Payer: PRIVATE HEALTH INSURANCE | Admitting: Anesthesiology

## 2017-03-27 ENCOUNTER — Ambulatory Visit
Admission: RE | Admit: 2017-03-27 | Discharge: 2017-03-27 | Disposition: A | Discharge status: Home / Self Care | Payer: PRIVATE HEALTH INSURANCE | Source: Ambulatory Visit | Attending: Unknown Physician Specialty | Admitting: Unknown Physician Specialty

## 2017-03-27 ENCOUNTER — Encounter: Payer: Self-pay | Admitting: Certified Registered Nurse Anesthetist

## 2017-03-27 DIAGNOSIS — K269 Duodenal ulcer, unspecified as acute or chronic, without hemorrhage or perforation: Secondary | ICD-10-CM | POA: Diagnosis not present

## 2017-03-27 DIAGNOSIS — Z87442 Personal history of urinary calculi: Secondary | ICD-10-CM | POA: Diagnosis not present

## 2017-03-27 DIAGNOSIS — K5730 Diverticulosis of large intestine without perforation or abscess without bleeding: Secondary | ICD-10-CM | POA: Insufficient documentation

## 2017-03-27 DIAGNOSIS — R1310 Dysphagia, unspecified: Secondary | ICD-10-CM | POA: Diagnosis not present

## 2017-03-27 DIAGNOSIS — J449 Chronic obstructive pulmonary disease, unspecified: Secondary | ICD-10-CM | POA: Diagnosis not present

## 2017-03-27 DIAGNOSIS — E755 Other lipid storage disorders: Secondary | ICD-10-CM | POA: Insufficient documentation

## 2017-03-27 DIAGNOSIS — K2950 Unspecified chronic gastritis without bleeding: Secondary | ICD-10-CM | POA: Insufficient documentation

## 2017-03-27 DIAGNOSIS — Z1211 Encounter for screening for malignant neoplasm of colon: Secondary | ICD-10-CM | POA: Diagnosis present

## 2017-03-27 DIAGNOSIS — K648 Other hemorrhoids: Secondary | ICD-10-CM | POA: Insufficient documentation

## 2017-03-27 DIAGNOSIS — J358 Other chronic diseases of tonsils and adenoids: Secondary | ICD-10-CM | POA: Insufficient documentation

## 2017-03-27 DIAGNOSIS — Z9989 Dependence on other enabling machines and devices: Secondary | ICD-10-CM | POA: Insufficient documentation

## 2017-03-27 DIAGNOSIS — K3189 Other diseases of stomach and duodenum: Secondary | ICD-10-CM | POA: Insufficient documentation

## 2017-03-27 DIAGNOSIS — F17210 Nicotine dependence, cigarettes, uncomplicated: Secondary | ICD-10-CM | POA: Insufficient documentation

## 2017-03-27 DIAGNOSIS — Z86010 Personal history of colonic polyps: Secondary | ICD-10-CM | POA: Diagnosis not present

## 2017-03-27 DIAGNOSIS — K449 Diaphragmatic hernia without obstruction or gangrene: Secondary | ICD-10-CM | POA: Diagnosis not present

## 2017-03-27 HISTORY — DX: Tobacco use: Z72.0

## 2017-03-27 HISTORY — DX: Hyperlipidemia, unspecified: E78.5

## 2017-03-27 HISTORY — DX: Other benign neuroendocrine tumors: D3A.8

## 2017-03-27 HISTORY — DX: Dysphagia, unspecified: R13.10

## 2017-03-27 HISTORY — DX: Personal history of urinary calculi: Z87.442

## 2017-03-27 HISTORY — PX: ESOPHAGOGASTRODUODENOSCOPY (EGD) WITH PROPOFOL: SHX5813

## 2017-03-27 HISTORY — PX: COLONOSCOPY WITH PROPOFOL: SHX5780

## 2017-03-27 SURGERY — COLONOSCOPY WITH PROPOFOL
Anesthesia: General

## 2017-03-27 MED ORDER — SODIUM CHLORIDE 0.9 % IV SOLN: INTRAVENOUS | Status: DC

## 2017-03-27 MED ORDER — VANCOMYCIN HCL IN DEXTROSE 1-5 GM/200ML-% IV SOLN
INTRAVENOUS | Status: AC
  Filled 2017-03-27: qty 200

## 2017-03-27 MED ORDER — MIDAZOLAM HCL 2 MG/2ML IJ SOLN
INTRAMUSCULAR | Status: DC | PRN
  Administered 2017-03-27 (×2): 1 mg via INTRAVENOUS

## 2017-03-27 MED ORDER — LIDOCAINE HCL (PF) 2 % IJ SOLN
INTRAMUSCULAR | Status: AC
  Filled 2017-03-27: qty 2

## 2017-03-27 MED ORDER — PIPERACILLIN-TAZOBACTAM 3.375 G IVPB 30 MIN
3.3750 g | Freq: Once | INTRAVENOUS | Status: AC
  Administered 2017-03-27: 3.375 g via INTRAVENOUS
  Filled 2017-03-27: qty 50

## 2017-03-27 MED ORDER — PROPOFOL 10 MG/ML IV BOLUS
INTRAVENOUS | Status: DC | PRN
  Administered 2017-03-27 (×3): 10 mg via INTRAVENOUS
  Administered 2017-03-27: 30 mg via INTRAVENOUS
  Administered 2017-03-27: 10 mg via INTRAVENOUS
  Administered 2017-03-27: 20 mg via INTRAVENOUS

## 2017-03-27 MED ORDER — SODIUM CHLORIDE 0.9 % IV SOLN
INTRAVENOUS | Status: DC
  Administered 2017-03-27: 09:00:00 via INTRAVENOUS

## 2017-03-27 MED ORDER — PIPERACILLIN-TAZOBACTAM 3.375 G IVPB
INTRAVENOUS | Status: AC
  Filled 2017-03-27: qty 50

## 2017-03-27 MED ORDER — MIDAZOLAM HCL 2 MG/2ML IJ SOLN
INTRAMUSCULAR | Status: AC
  Filled 2017-03-27: qty 2

## 2017-03-27 MED ORDER — PROPOFOL 500 MG/50ML IV EMUL
INTRAVENOUS | Status: DC | PRN
  Administered 2017-03-27: 100 ug/kg/min via INTRAVENOUS

## 2017-03-27 MED ORDER — IPRATROPIUM-ALBUTEROL 0.5-2.5 (3) MG/3ML IN SOLN
3.0000 mL | Freq: Once | RESPIRATORY_TRACT | Status: AC
  Administered 2017-03-27: 3 mL via RESPIRATORY_TRACT

## 2017-03-27 MED ORDER — LIDOCAINE HCL (CARDIAC) 20 MG/ML IV SOLN
INTRAVENOUS | Status: DC | PRN
  Administered 2017-03-27: 40 mg via INTRAVENOUS

## 2017-03-27 MED ORDER — PROPOFOL 10 MG/ML IV BOLUS
INTRAVENOUS | Status: AC
  Filled 2017-03-27: qty 20

## 2017-03-27 MED ORDER — IPRATROPIUM-ALBUTEROL 0.5-2.5 (3) MG/3ML IN SOLN
RESPIRATORY_TRACT | Status: AC
  Filled 2017-03-27: qty 3

## 2017-03-27 MED ORDER — PROPOFOL 500 MG/50ML IV EMUL
INTRAVENOUS | Status: AC
  Filled 2017-03-27: qty 50

## 2017-03-27 NOTE — Op Note (Signed)
[facility] Gastroenterology Patient Name: Jared [name] Procedure Date: 03/27/2017 8:37 AM MRN: [id number] Account #: [id number] Date of Birth: October 22, 1955 Admit Type: Outpatient Age: 61 Room: [facility] ENDO ROOM 1 Gender: Male Note Status: Finalized Procedure:            Colonoscopy Indications:          High risk colon cancer surveillance: Personal history                        of colonic polyps Providers:            Manya Silvas, MD Referring MD:         Youlanda Roys. Lovie Macadamia, MD (Referring MD) Medicines:            Propofol per Anesthesia Complications:        No immediate complications. Procedure:            Pre-Anesthesia Assessment:                       - After reviewing the risks and benefits, the patient                        was deemed in satisfactory condition to undergo the                        procedure.                       After obtaining informed consent, the colonoscope was                        passed under direct vision. Throughout the procedure,                        the patient's blood pressure, pulse, and oxygen                        saturations were monitored continuously. The                        Colonoscope was introduced through the anus and                        advanced to the the cecum, identified by appendiceal                        orifice and ileocecal valve. The colonoscopy was                        somewhat difficult due to multiple diverticula in the                        colon and significant looping. Successful completion of                        the procedure was aided by lavage. The patient                        tolerated the procedure well. The quality of the bowel  preparation was adequate to identify polyps 6 mm and                        larger in size. Findings:      The colon was difficult and required significant manuvering.      Two sessile polyps were found in the rectum. The  polyps were diminutive       in size. These polyps were removed with a jumbo cold forceps. Resection       and retrieval were complete.      Multiple medium-mouthed diverticula were found in the sigmoid colon.      Internal hemorrhoids were found during endoscopy. The hemorrhoids were       small-medium. Impression:           - Two diminutive polyps in the rectum, removed with a                        jumbo cold forceps. Resected and retrieved.                       - Diverticulosis in the sigmoid colon.                       - Internal hemorrhoids. Recommendation:       - Await pathology results. Manya Silvas, MD 03/27/2017 9:40:06 AM This report has been signed electronically. Number of Addenda: 0 Note Initiated On: 03/27/2017 8:37 AM Scope Withdrawal Time: 0 hours 12 minutes 57 seconds  Total Procedure Duration: 0 hours 21 minutes 48 seconds       [facility]

## 2017-03-27 NOTE — Anesthesia Postprocedure Evaluation (Signed)
Anesthesia Post Note  Patient: Jared [name]  Procedure(s) Performed: Procedure(s) (LRB): COLONOSCOPY WITH PROPOFOL (N/A) ESOPHAGOGASTRODUODENOSCOPY (EGD) WITH PROPOFOL (N/A)  Patient location during evaluation: Endoscopy Anesthesia Type: General Level of consciousness: awake and alert Pain management: pain level controlled Vital Signs Assessment: post-procedure vital signs reviewed and stable Respiratory status: spontaneous breathing, nonlabored ventilation, respiratory function stable and patient connected to nasal cannula oxygen Cardiovascular status: blood pressure returned to baseline and stable Postop Assessment: no signs of nausea or vomiting Anesthetic complications: no     Last Vitals:  Vitals:   03/27/17 0937 03/27/17 0938  BP: 98/70 98/67  Pulse:  (!) 52  Resp:  16  Temp: (!) 36.2 C (!) 36.2 C  SpO2: 95% 97%    Last Pain:  Vitals:   03/27/17 0938  TempSrc: Tympanic                 Precious Haws Hiromi Knodel

## 2017-03-27 NOTE — Anesthesia Preprocedure Evaluation (Signed)
Anesthesia Evaluation  Patient identified by MRN, date of birth, ID band Patient awake    Reviewed: Allergy & Precautions, H&P , NPO status , Patient's Chart, lab work & pertinent test results  History of Anesthesia Complications Negative for: history of anesthetic complications  Airway Mallampati: III  TM Distance: <3 FB Neck ROM: limited    Dental  (+) Poor Dentition, Chipped, Missing, Edentulous Upper   Pulmonary neg shortness of breath, sleep apnea and Continuous Positive Airway Pressure Ventilation , COPD, Current Smoker,           Cardiovascular Exercise Tolerance: Good (-) angina(-) Past MI and (-) DOE negative cardio ROS       Neuro/Psych negative neurological ROS  negative psych ROS   GI/Hepatic negative GI ROS, Neg liver ROS, neg GERD  ,  Endo/Other  negative endocrine ROS  Renal/GU negative Renal ROS  negative genitourinary   Musculoskeletal   Abdominal   Peds  Hematology negative hematology ROS (+)   Anesthesia Other Findings Past Medical History: No date: Dysphagia No date: Elevated lipids No date: History of kidney stones No date: Neuroendocrine tumor No date: Tobacco abuse  History reviewed. No pertinent surgical history.     Reproductive/Obstetrics negative OB ROS                             Anesthesia Physical Anesthesia Plan  ASA: III  Anesthesia Plan: General   Post-op Pain Management:    Induction: Intravenous  PONV Risk Score and Plan: Propofol infusion  Airway Management Planned: Natural Airway and Nasal Cannula  Additional Equipment:   Intra-op Plan:   Post-operative Plan:   Informed Consent: I have reviewed the patients History and Physical, chart, labs and discussed the procedure including the risks, benefits and alternatives for the proposed anesthesia with the patient or authorized representative who has indicated his/her understanding and  acceptance.   Dental Advisory Given  Plan Discussed with: Anesthesiologist, CRNA and Surgeon  Anesthesia Plan Comments: (Patient consented for risks of anesthesia including but not limited to:  - adverse reactions to medications - risk of intubation if required - damage to teeth, lips or other oral mucosa - sore throat or hoarseness - Damage to heart, brain, lungs or loss of life  Patient voiced understanding.)        Anesthesia Quick Evaluation

## 2017-03-27 NOTE — Transfer of Care (Signed)
Immediate Anesthesia Transfer of Care Note  Patient: Jared [name]  Procedure(s) Performed: Procedure(s): COLONOSCOPY WITH PROPOFOL (N/A) ESOPHAGOGASTRODUODENOSCOPY (EGD) WITH PROPOFOL (N/A)  Patient Location: PACU  Anesthesia Type:General  Level of Consciousness: sedated  Airway & Oxygen Therapy: Patient Spontanous Breathing and Patient connected to nasal cannula oxygen  Post-op Assessment: Report given to RN and Post -op Vital signs reviewed and stable  Post vital signs: Reviewed and stable  Last Vitals:  Vitals:   03/27/17 0937 03/27/17 0938  BP: 98/70 98/67  Pulse:  (!) 52  Resp:  16  Temp: (!) 36.2 C (!) 36.2 C  SpO2: 95% 97%    Last Pain:  Vitals:   03/27/17 0938  TempSrc: Tympanic         Complications: No apparent anesthesia complications

## 2017-03-27 NOTE — Op Note (Signed)
[facility] Gastroenterology Patient Name: Jared [name] Procedure Date: 03/27/2017 8:40 AM MRN: [id number] Account #: [id number] Date of Birth: [birth year] Admit Type: Outpatient Age: 61 Room: [facility] ENDO ROOM 1 Gender: Male Note Status: Finalized Procedure:            Upper GI endoscopy Indications:          Dysphagia Providers:            Manya Silvas, MD Referring MD:         Youlanda Roys. Lovie Macadamia, MD (Referring MD) Medicines:            Propofol per Anesthesia Complications:        No immediate complications. Procedure:            Pre-Anesthesia Assessment:                       - After reviewing the risks and benefits, the patient                        was deemed in satisfactory condition to undergo the                        procedure.                       After obtaining informed consent, the endoscope was                        passed under direct vision. Throughout the procedure,                        the patient's blood pressure, pulse, and oxygen                        saturations were monitored continuously. The Endoscope                        was introduced through the mouth, and advanced to the                        duodenal bulb. The upper GI endoscopy was somewhat                        difficult due to abnormal anatomy. The patient                        tolerated the procedure well. Findings:      Multiple abnormal ring of adenoid tissue.      A small hiatal hernia was present. The stomach showed twisted body       somewhat, possible due to previous surgery.      A few localized, diminutive non-bleeding erosions were found in the       gastric body and in the gastric antrum. There were no stigmata of recent       bleeding.      Two non-bleeding cratered duodenal ulcers with no stigmata of bleeding       were found in the duodenal bulb. The largest lesion was 3 mm in largest       dimension.      A guide wire was passed and the scope  withdrawn and a  54F Savary dilator       passed but did not pass completely Impression:           - Multiple abnormal ring of adenoid tissue.                       - Small hiatal hernia.                       - Non-bleeding erosive gastropathy.                       - Multiple non-bleeding duodenal ulcers with no                        stigmata of bleeding.                       - No specimens collected. Recommendation:       - Await pathology results. Manya Silvas, MD 03/27/2017 9:10:36 AM This report has been signed electronically. Number of Addenda: 0 Note Initiated On: 03/27/2017 8:40 AM      [facility]

## 2017-03-27 NOTE — Anesthesia Post-op Follow-up Note (Signed)
Anesthesia QCDR form completed.        

## 2017-03-27 NOTE — Anesthesia Procedure Notes (Signed)
Date/Time: 03/27/2017 8:40 AM Performed by: Johnna Acosta Pre-anesthesia Checklist: Patient identified, Emergency Drugs available, Suction available, Patient being monitored and Timeout performed Patient Re-evaluated:Patient Re-evaluated prior to induction Oxygen Delivery Method: Nasal cannula

## 2017-03-27 NOTE — H&P (Signed)
   Primary Care Physician:  [name], MD Primary Gastroenterologist:  Dr. Vira Agar  Pre-Procedure History & Physical: HPI:  Jared [name] is a 61 y.o. male is here for an endoscopy and colonoscopy.   Past Medical History:  Diagnosis Date  . Dysphagia   . Elevated lipids   . History of kidney stones   . Neuroendocrine tumor   . Tobacco abuse     Past Surgical History:  Procedure Laterality Date  . CHOLECYSTECTOMY    . hip fracture with rod placement Right     Prior to Admission medications   Medication Sig Start Date End Date Taking? Authorizing Provider  clonazePAM (KLONOPIN) 1 MG tablet Take 1 mg by mouth 2 (two) times daily.   Yes [provider]  ibuprofen (ADVIL,MOTRIN) 200 MG tablet Take 200 mg by mouth every 6 (six) hours as needed.   Yes [provider]    Allergies as of 12/24/2016  . (Not on File)    History reviewed. No pertinent family history.  Social History   Social History  . Marital status: Married    Spouse name: N/A  . Number of children: N/A  . Years of education: N/A   Occupational History  . Not on file.   Social History Main Topics  . Smoking status: Current Every Day Smoker    Packs/day: 1.00  . Smokeless tobacco: Never Used  . Alcohol use No  . Drug use: No  . Sexual activity: Not on file   Other Topics Concern  . Not on file   Social History Narrative  . No narrative on file    Review of Systems: See HPI, otherwise negative ROS  Physical Exam: BP 112/76   Pulse 62   Temp 97.6 F (36.4 C) (Tympanic)   Resp 18   Ht 6\' 6"  (1.981 m)   Wt 95.3 kg (210 lb)   SpO2 98%   BMI 24.27 kg/m  General:   Alert,  pleasant and cooperative in NAD Head:  Normocephalic and atraumatic. Neck:  Supple; no masses or thyromegaly. Lungs:  Clear throughout to auscultation.    Heart:  Regular rate and rhythm. Abdomen:  Soft, nontender and nondistended. Normal bowel sounds, without guarding, and without rebound.    Neurologic:  Alert and  oriented x4;  grossly normal neurologically.  Impression/Plan: Jared [name] is here for an endoscopy and colonoscopy to be performed for dysphagia and abnormal barium swallow and PH colon polyps.  Risks, benefits, limitations, and alternatives regarding  endoscopy and colonoscopy have been reviewed with the patient.  Questions have been answered.  All parties agreeable.   Gaylyn Cheers, MD  03/27/2017, 8:37 AM

## 2017-03-30 ENCOUNTER — Encounter: Payer: Self-pay | Admitting: Unknown Physician Specialty

## 2017-03-30 LAB — SURGICAL PATHOLOGY

## 2018-09-28 ENCOUNTER — Ambulatory Visit: Payer: BLUE CROSS/BLUE SHIELD | Admitting: Urology

## 2018-09-28 ENCOUNTER — Encounter: Payer: Self-pay | Admitting: Urology

## 2018-09-28 VITALS — BP 117/73 | HR 59 | Ht 78.0 in | Wt 208.0 lb

## 2018-09-28 DIAGNOSIS — Z87442 Personal history of urinary calculi: Secondary | ICD-10-CM

## 2018-09-28 DIAGNOSIS — R350 Frequency of micturition: Secondary | ICD-10-CM | POA: Diagnosis not present

## 2018-09-28 DIAGNOSIS — R300 Dysuria: Secondary | ICD-10-CM

## 2018-09-28 NOTE — Progress Notes (Signed)
09/28/2018 10:16 AM   Jared [name] 1955-12-19 [id number]  Referring provider: [name], MD [phone], [place] [address]  CC: Dysuria, urgency, frequency  HPI: I saw Jared [name] in urology clinic today in consultation from Dr. Lovie Macadamia for urinary urgency, frequency, and dysuria.  He is a 63 year old relatively healthy male with a long history of recurrent nephrolithiasis status post multiple ureteroscopy's.  He reports a 4 to 5-year history of occasional dysuria, with painful urination after intercourse or masturbation.  He also reports a one-month history of urinary frequency, urgency, and urge incontinence.  This is moderate in severity.  There are no aggravating or alleviating factors.  He does drink quite a bit of coffee and soda during the day.  He underwent work-up with a urinalysis which was completely benign, and urine culture was negative.  He denies any flank pain or symptoms similar to his prior stone episodes.  He denies gross hematuria, fevers, or chills.  IPSS score today is 11, with quality of life mostly dissatisfied.  PVR in clinic is 1 cc.  PSA 09/07/2017 was normal and stable at 0.58.  His last cross-sectional imaging was 08/21/2017 in care [facility] at [facility], and showed bilateral non-obstructing renal stones, these images are unavailable for me to personally review.  He has a 40-pack-year smoking history, and is an active smoker.   PMH: Past Medical History:  Diagnosis Date  . Dysphagia   . Elevated lipids   . History of kidney stones   . Neuroendocrine tumor   . Tobacco abuse     Surgical History: Past Surgical History:  Procedure Laterality Date  . CHOLECYSTECTOMY    . COLONOSCOPY WITH PROPOFOL N/A 03/27/2017   Procedure: COLONOSCOPY WITH PROPOFOL;  Surgeon: Manya Silvas, MD;  Location: [facility] ENDOSCOPY;  Service: Endoscopy;  Laterality: N/A;  . ESOPHAGOGASTRODUODENOSCOPY (EGD) WITH PROPOFOL N/A 03/27/2017   Procedure:  ESOPHAGOGASTRODUODENOSCOPY (EGD) WITH PROPOFOL;  Surgeon: Manya Silvas, MD;  Location: [facility] ENDOSCOPY;  Service: Endoscopy;  Laterality: N/A;  . hip fracture with rod placement Right     Allergies: No Known Allergies  Family History: No family history on file.  Social History:  reports that he has been smoking. He has been smoking about 1.00 pack per day. He has never used smokeless tobacco. He reports that he does not drink alcohol or use drugs.  ROS: Please see flowsheet from today's date for complete review of systems.  Physical Exam: BP 117/73 (BP Location: Left Arm, Patient Position: Sitting)   Pulse (!) 59   Ht 6\' 6"  (1.981 m)   Wt 208 lb (94.3 kg)   BMI 24.04 kg/m    Constitutional:  Alert and oriented, No acute distress. Cardiovascular: No clubbing, cyanosis, or edema. Respiratory: Normal respiratory effort, no increased work of breathing. GI: Abdomen is soft, nontender, nondistended, no abdominal masses GU: No CVA tenderness, phallus without lesions, widely patent meatus Lymph: No cervical or inguinal lymphadenopathy. Skin: No rashes, bruises or suspicious lesions. Neurologic: Grossly intact, no focal deficits, moving all 4 extremities. Psychiatric: Normal mood and affect.  Laboratory Data: Urinalysis today 0 WBCs, 0 RBCs, no bacteria, nitrite negative  Assessment & Plan:   In summary, the patient is a 63 year old male with a long history of nephrolithiasis status post multiple ureteroscopy's who presents with a one-month history of worsening urgency, frequency, and dysuria.  He has an extensive smoking history.  He had denies any gross hematuria, and has not had any microscopic hematuria.  We discussed possible causes of his symptoms including urethral stricture, bladder tumor/carcinoma in situ, overactive bladder, or kidney stones.  He does not have the classic symptoms of his stone presentations, and I do not feel a CT is warranted at this time.  RTC for  cystoscopy to rule out urethral stricture/carcinoma in situ next available If cystoscopy negative, trial of overactive bladder medication  Billey Co, MD  [place] [address] [place], [place] [address] [phone]

## 2018-09-28 NOTE — Patient Instructions (Addendum)
Cystoscopy  Cystoscopy is a procedure that is used to help diagnose and sometimes treat conditions that affect that lower urinary tract. The lower urinary tract includes the bladder and the tube that drains urine from the bladder out of the body (urethra). Cystoscopy is performed with a thin, tube-shaped instrument with a light and camera at the end (cystoscope). The cystoscope may be hard (rigid) or flexible, depending on the goal of the procedure.The cystoscope is inserted through the urethra, into the bladder. Cystoscopy may be recommended if you have:  Urinary tractinfections that keep coming back (recurring).  Blood in the urine (hematuria).  Loss of bladder control (urinary incontinence) or an overactive bladder.  Unusual cells found in a urine sample.  A blockage in the urethra.  Painful urination.  An abnormality in the bladder found during an intravenous pyelogram (IVP) or CT scan. Cystoscopy may also be done to remove a sample of tissue to be examined under a microscope (biopsy). Tell a health care provider about:  Any allergies you have.  All medicines you are taking, including vitamins, herbs, eye drops, creams, and over-the-counter medicines.  Any problems you or family members have had with anesthetic medicines.  Any blood disorders you have.  Any surgeries you have had.  Any medical conditions you have.  Whether you are pregnant or may be pregnant. What are the risks? Generally, this is a safe procedure. However, problems may occur, including:  Infection.  Bleeding.  Allergic reactions to medicines.  Damage to other structures or organs. What happens before the procedure?  Ask your health care provider about: ? Changing or stopping your regular medicines. This is especially important if you are taking diabetes medicines or blood thinners. ? Taking medicines such as aspirin and ibuprofen. These medicines can thin your blood. Do not take these medicines  before your procedure if your health care provider instructs you not to.  Follow instructions from your health care provider about eating or drinking restrictions.  You may be given antibiotic medicine to help prevent infection.  You may have an exam or testing, such as X-rays of the bladder, urethra, or kidneys.  You may have urine tests to check for signs of infection.  What happens during the procedure?  To reduce your risk of infection,your health care team will wash or sanitize their hands.  You will be given one or more of the following: ? A medicine to help you relax (sedative). ? A medicine to numb the area (local anesthetic).  The area around the opening of your urethra will be cleaned.  The cystoscope will be passed through your urethra into your bladder.  Germ-free (sterile)fluid will flow through the cystoscope to fill your bladder. The fluid will stretch your bladder so that your surgeon can clearly examine your bladder walls.  The cystoscope will be removed and your bladder will be emptied. The procedure may vary among health care providers and hospitals. What happens after the procedure?  You may have some soreness or pain in your abdomen and urethra. Medicines will be available to help you.  You may have some blood in your urine.  Do not drive for 24 hours if you received a sedative. This information is not intended to replace advice given to you by your health care provider. Make sure you discuss any questions you have with your health care provider. Document Released: 07/04/2000 Document Revised: 04/17/2017 Document Reviewed: 05/24/2015 Elsevier Interactive Patient Education  2019 [employer].     Overactive  Bladder, Adult  Overactive bladder refers to a condition in which a person has a sudden need to pass urine. The person may leak urine if he or she cannot get to the bathroom fast enough (urinary incontinence). A person with this condition may also  wake up several times in the night to go to the bathroom. Overactive bladder is associated with poor nerve signals between your bladder and your brain. Your bladder may get the signal to empty before it is full. You may also have very sensitive muscles that make your bladder squeeze too soon. These symptoms might interfere with daily work or social activities. What are the causes? This condition may be associated with or caused by:  Urinary tract infection.  Infection of nearby tissues, such as the prostate.  Prostate enlargement.  Surgery on the uterus or urethra.  Bladder stones, inflammation, or tumors.  Drinking too much caffeine or alcohol.  Certain medicines, especially medicines that get rid of extra fluid in the body (diuretics).  Muscle or nerve weakness, especially from: ? A spinal cord injury. ? Stroke. ? Multiple sclerosis. ? Parkinson's disease.  Diabetes.  Constipation. What increases the risk? You may be at greater risk for overactive bladder if you:  Are an older adult.  Smoke.  Are going through menopause.  Have prostate problems.  Have a neurological disease, such as stroke, dementia, Parkinson's disease, or multiple sclerosis (MS).  Eat or drink things that irritate the bladder. These include alcohol, spicy food, and caffeine.  Are overweight or obese. What are the signs or symptoms? Symptoms of this condition include:  Sudden, strong urge to urinate.  Leaking urine.  Urinating 8 or more times a day.  Waking up to urinate 2 or more times a night. How is this diagnosed? Your health care provider may suspect overactive bladder based on your symptoms. He or she will diagnose this condition by:  A physical exam and medical history.  Blood or urine tests. You might need bladder or urine tests to help determine what is causing your overactive bladder. You might also need to see a health care provider who specializes in urinary tract problems  (urologist). How is this treated? Treatment for overactive bladder depends on the cause of your condition and whether it is mild or severe. You can also make lifestyle changes at home. Options include:  Bladder training. This may include: ? Learning to control the urge to urinate by following a schedule that directs you to urinate at regular intervals (timed voiding). ? Doing Kegel exercises to strengthen your pelvic floor muscles, which support your bladder. Toning these muscles can help you control urination, even if your bladder muscles are overactive.  Special devices. This may include: ? Biofeedback, which uses sensors to help you become aware of your body's signals. ? Electrical stimulation, which uses electrodes placed inside the body (implanted) or outside the body. These electrodes send gentle pulses of electricity to strengthen the nerves or muscles that control the bladder. ? Women may use a plastic device that fits into the vagina and supports the bladder (pessary).  Medicines. ? Antibiotics to treat bladder infection. ? Antispasmodics to stop the bladder from releasing urine at the wrong time. ? Tricyclic antidepressants to relax bladder muscles. ? Injections of botulinum toxin type A directly into the bladder tissue to relax bladder muscles.  Lifestyle changes. This may include: ? Weight loss. Talk to your health care provider about weight loss methods that would work best for you. ?  Diet changes. This may include reducing how much alcohol and caffeine you consume, or drinking fluids at different times of the day. ? Not smoking. Do not use any products that contain nicotine or tobacco, such as cigarettes and e-cigarettes. If you need help quitting, ask your health care provider.  Surgery. ? A device may be implanted to help manage the nerve signals that control urination. ? An electrode may be implanted to stimulate electrical signals in the bladder. ? A procedure may be done  to change the shape of the bladder. This is done only in very severe cases. Follow these instructions at home: Lifestyle  Make any diet or lifestyle changes that are recommended by your health care provider. These may include: ? Drinking less fluid or drinking fluids at different times of the day. ? Cutting down on caffeine or alcohol. ? Doing Kegel exercises. ? Losing weight if needed. ? Eating a healthy and balanced diet to prevent constipation. This may include:  Eating foods that are high in fiber, such as fresh fruits and vegetables, whole grains, and beans.  Limiting foods that are high in fat and processed sugars, such as fried and sweet foods. General instructions  Take over-the-counter and prescription medicines only as told by your health care provider.  If you were prescribed an antibiotic medicine, take it as told by your health care provider. Do not stop taking the antibiotic even if you start to feel better.  Use any implants or pessary as told by your health care provider.  If needed, wear pads to absorb urine leakage.  Keep a journal or log to track how much and when you drink and when you feel the need to urinate. This will help your health care provider monitor your condition.  Keep all follow-up visits as told by your health care provider. This is important. Contact a health care provider if:  You have a fever.  Your symptoms do not get better with treatment.  Your pain and discomfort get worse.  You have more frequent urges to urinate. Get help right away if:  You are not able to control your bladder. Summary  Overactive bladder refers to a condition in which a person has a sudden need to pass urine.  Several conditions may lead to an overactive bladder.  Treatment for overactive bladder depends on the cause and severity of your condition.  Follow your health care provider's instructions about lifestyle changes, doing Kegel exercises, keeping a  journal, and taking medicines. This information is not intended to replace advice given to you by your health care provider. Make sure you discuss any questions you have with your health care provider. Document Released: 05/03/2009 Document Revised: 07/23/2017 Document Reviewed: 07/23/2017 Elsevier Interactive Patient Education  2019 [employer].

## 2018-09-29 LAB — URINALYSIS, COMPLETE
Bilirubin, UA: NEGATIVE
Glucose, UA: NEGATIVE
Ketones, UA: NEGATIVE
Leukocytes, UA: NEGATIVE
Nitrite, UA: NEGATIVE
Protein, UA: NEGATIVE
RBC, UA: NEGATIVE
Specific Gravity, UA: >1.03 — ABNORMAL HIGH (ref 1.005–1.030)
Urobilinogen, Ur: 0.2 mg/dL (ref 0.2–1.0)
pH, UA: 5 (ref 5.0–7.5)

## 2018-10-05 ENCOUNTER — Encounter: Payer: Self-pay | Admitting: Urology

## 2018-10-05 ENCOUNTER — Other Ambulatory Visit: Payer: Self-pay

## 2018-10-05 ENCOUNTER — Ambulatory Visit: Payer: BLUE CROSS/BLUE SHIELD | Admitting: Urology

## 2018-10-05 VITALS — BP 128/71 | HR 67 | Ht 78.0 in | Wt 208.0 lb

## 2018-10-05 DIAGNOSIS — N3010 Interstitial cystitis (chronic) without hematuria: Secondary | ICD-10-CM

## 2018-10-05 DIAGNOSIS — R350 Frequency of micturition: Secondary | ICD-10-CM

## 2018-10-05 LAB — URINALYSIS, COMPLETE
Bilirubin, UA: NEGATIVE
Glucose, UA: NEGATIVE
Ketones, UA: NEGATIVE
Leukocytes, UA: NEGATIVE
Nitrite, UA: NEGATIVE
Protein, UA: NEGATIVE
RBC, UA: NEGATIVE
Specific Gravity, UA: 1.01 (ref 1.005–1.030)
Urobilinogen, Ur: 0.2 mg/dL (ref 0.2–1.0)
pH, UA: 6.5 (ref 5.0–7.5)

## 2018-10-05 MED ORDER — URIBEL 118 MG PO CAPS: 118.0000 mg | ORAL_CAPSULE | Freq: Four times a day (QID) | ORAL | 2 refills | Status: DC | PRN

## 2018-10-05 MED ORDER — LIDOCAINE HCL URETHRAL/MUCOSAL 2 % EX GEL: 1.0000 "application " | Freq: Once | CUTANEOUS | Status: DC

## 2018-10-05 NOTE — Patient Instructions (Addendum)
Eating Plan for Interstitial Cystitis Interstitial cystitis (IC) is a long-term (chronic) condition that causes pain and pressure in the bladder, the lower abdomen, and the pelvic area. Other symptoms of IC include urinary urgency and frequency. Symptoms tend to come and go. Many people with IC find that certain foods trigger their symptoms. Different foods may be problematic for different people. Some foods are more likely to cause symptoms than others. Learning which foods bother you and which do not can help you come up with an eating plan to manage IC. What are tips for following this plan? You may find it helpful to work with a dietitian. This health care provider can help you develop your eating plan by doing an elimination diet, which involves these steps:  Start with a list of foods that you think trigger your IC symptoms along with the foods that most commonly trigger symptoms for many people with IC.  Eliminate those foods from your diet for about one month, then start reintroducing the foods one at a time to see which ones trigger your symptoms.  Make a list of the foods that trigger your symptoms. It may take several months to find out which foods bother you. Reading food labels Once you know which foods trigger your IC symptoms, you can avoid them. However, it is also a good idea to read food labels because some foods that trigger your symptoms may be included as ingredients in other foods. These ingredients may include:  Chili peppers.  Tomato products.  Soy.  Worcestershire sauce.  Vinegar.  Alcohol.  Citrus flavors or juices.  Artificial sweeteners.  Monosodium glutamate. Shopping  Shopping can be a challenge if many foods trigger your IC. When you go grocery shopping, bring a list of the foods you can eat.  You can get an app for your phone that lets you know which foods are the safest and which you may want to avoid. You can find the app at the Interstitial  Cystitis Network website: www.ic-network.com Meal planning  Plan your meals according to the results of your elimination diet. If you have not done an elimination diet, plan meals according to IC food lists recommended by your health care provider or dietitian. These lists tell you which foods are least and most likely to cause symptoms.  Avoid certain types of food when you go out to eat, such as pizza and foods typically served at [place], [place], and [place]. These foods often contain ingredients that can aggravate IC. General information Here are some general guidelines for an IC eating plan:  Do not eat large portions.  Drink plenty of fluids with your meals.  Do not eat foods that are high in sugar, salt, or saturated fat.  Choose whole fruits instead of juice.  Eat a colorful variety of vegetables. What foods should I eat? For people with IC, the best diet is a balanced one that includes things from all the food groups. Even if you have to avoid certain foods, there are still plenty of healthy choices in each group. The following are some foods that are least bothersome and may be safest to eat: Fruits Bananas. Blueberries and blueberry juice. Melons. Pears. Apples. Dates. Prunes. Raisins. Apricots. Vegetables Asparagus. Avocado. Celery. Beets. Bell peppers. Black olives. Broccoli. Brussels sprouts. Cabbage. Carrots. Cauliflower. Cucumber. Eggplant. Green beans. Potatoes. Radishes. Spinach. Squash. Turnips. Zucchini. Mushrooms. Peas. Grains Oats. Rice. Bran. Oatmeal. Whole wheat bread. Meats and other proteins Beef. Fish and other seafood. Eggs. Nuts.  Broccoli. Brussels sprouts. Cabbage. Carrots. Cauliflower. Cucumber. Eggplant. Green beans. Potatoes. Radishes. Spinach. Squash. Turnips. Zucchini. Mushrooms. Peas.  Grains  Oats. Rice. Bran. Oatmeal. Whole wheat bread.  Meats and other proteins  Beef. Fish and other seafood. Eggs. Nuts. Peanut butter. Pork. Poultry. Lamb. Garbanzo beans. Pinto beans.  Dairy  Whole or low-fat milk. American, mozzarella, mild cheddar, feta, ricotta, and cream cheeses.  The items listed above may not be a complete list of foods and beverages you can eat. Contact a dietitian for more information.  What foods should I avoid?  You  should avoid any foods that seem to trigger your symptoms. It is also a good idea to avoid foods that are most likely to cause symptoms in many people with IC. These include the following:  Fruits  Citrus fruits, including lemons, limes, oranges, and grapefruit. Cranberries. Strawberries. Pineapple. Kiwi.  Vegetables  Chili peppers. Onions. Sauerkraut. Tomato and tomato products. Pickles.  Grains  You do not need to avoid any type of grain unless it triggers your symptoms.  Meats and other proteins  Precooked or cured meats, such as sausages or meat loaves. Soy products.  Dairy  Chocolate ice cream. Processed cheese. Yogurt.  Beverages  Alcohol. Chocolate drinks. Coffee. Cranberry juice. Carbonated drinks. Tea (black, green, or herbal). Tomato juice. Sports drinks.  The items listed above may not be a complete list of foods and beverages you should avoid. Contact a dietitian for more information.  Summary  · Many people with IC find that certain foods trigger their symptoms. Different foods may be problematic for different people. Some foods are more likely to cause symptoms than others.  · You may find it helpful to work with a dietitian to do an elimination diet and come up with an eating plan that is right for you.  · Plan your meals according to the results of your elimination diet. If you have not done an elimination diet, plan your meals using IC food lists. These lists tell you which foods are least and most likely to cause symptoms.  · The best diet for people with IC is a balanced diet that includes foods from all the food groups. Even if you have to avoid certain foods, there are still plenty of healthy choices in each group.  This information is not intended to replace advice given to you by your health care provider. Make sure you discuss any questions you have with your health care provider.  Document Released: 03/11/2018 Document Revised: 03/11/2018 Document Reviewed: 03/11/2018  Elsevier Interactive  Patient Education © 2019 [employer]  Interstitial Cystitis    Interstitial cystitis is inflammation of the bladder. This may cause pain in the bladder area as well as a frequent and urgent need to urinate. The bladder is a hollow organ in the lower part of the abdomen. It stores urine after the urine is made in the kidneys.  The severity of interstitial cystitis can vary from person to person. You may have flare-ups, and then your symptoms may go away for a while. For many people, it becomes a long-term (chronic) problem.  What are the causes?  The cause of this condition is not known.  What increases the risk?  The following factors may make you more likely to develop this condition:  · You are male.  · You have fibromyalgia.  · You have irritable bowel syndrome (IBS).  · You have endometriosis.  This condition may be aggravated by:  · Stress.  ·   Smoking.  · Spicy foods.  What are the signs or symptoms?  Symptoms of interstitial cystitis vary, and they can change over time. Symptoms may include:  · Discomfort or pain in the bladder area, which is in the lower abdomen. Pain can range from mild to severe. The pain may change in intensity as the bladder fills with urine or as it empties.  · Pain in the pelvic area, between the hip bones.  · An urgent need to urinate.  · Frequent urination.  · Pain during urination.  · Pain during sex.  · Blood in the urine.  For women, symptoms often get worse during menstruation.  How is this diagnosed?  This condition is diagnosed based on your symptoms, your medical history, and a physical exam. You may have tests to rule out other conditions, such as:  · Urine tests.  · Cystoscopy. For this test, a tool similar to a very thin telescope is used to look into your bladder.  · Biopsy. This involves taking a sample of tissue from the bladder to be examined under a microscope.  How is this treated?  There is no cure for this condition, but treatment can help you control your  symptoms. Work closely with your health care provider to find the most effective treatments for you. Treatment options may include:  · Medicines to relieve pain and reduce how often you feel the need to urinate.  · Learning ways to control when you urinate (bladder training).  · Lifestyle changes, such as changing your diet or taking steps to control stress.  · Using a device that provides electrical stimulation to your nerves, which can relieve pain (neuromodulation therapy). The device is placed on your back, where it blocks the nerves that cause you to feel pain in your bladder area.  · A procedure that stretches your bladder by filling it with air or fluid.  · Surgery. This is rare. It is only done for extreme cases, if other treatments do not help.  Follow these instructions at home:  Bladder training    · Use bladder training techniques as directed. Techniques may include:  ? Urinating at scheduled times.  ? Training yourself to delay urination.  ? Doing exercises (Kegel exercises) to strengthen the muscles that control urine flow.  · Keep a bladder diary.  ? Write down the times that you urinate and any symptoms that you have. This can help you find out which foods, liquids, or activities make your symptoms worse.  ? Use your bladder diary to schedule bathroom trips. If you are away from home, plan to be near a bathroom at each of your scheduled times.  · Make sure that you urinate just before you leave the house and just before you go to bed.  Eating and drinking  · Make dietary changes as recommended by your health care provider. You may need to avoid:  ? Spicy foods.  ? Foods that contain a lot of potassium.  · Limit your intake of beverages that make you need to urinate. These include:  ? Caffeinated beverages like soda, coffee, and tea.  ? Alcohol.  General instructions  · Take over-the-counter and prescription medicines only as told by your health care provider.  · Do not drink alcohol.  · You can try a  warm or cool compress over your bladder for comfort.  · Avoid wearing tight clothing.  · Do not use any products that contain nicotine or tobacco, such as cigarettes   and e-cigarettes. If you need help quitting, ask your health care provider.  · Keep all follow-up visits as told by your health care provider. This is important.  Contact a health care provider if you have:  · Symptoms that do not get better with treatment.  · Pain or discomfort that gets worse.  · More frequent urges to urinate.  · A fever.  Get help right away if:  · You have no control over when you urinate.  Summary  · Interstitial cystitis is inflammation of the bladder.  · This condition may cause pain in the bladder area as well as a frequent and urgent need to urinate.  · You may have flare-ups of the condition, and then it may go away for a while. For many people, it becomes a long-term (chronic) problem.  · There is no cure for interstitial cystitis, but treatment methods are available to control your symptoms.  This information is not intended to replace advice given to you by your health care provider. Make sure you discuss any questions you have with your health care provider.  Document Released: 03/07/2004 Document Revised: 06/01/2017 Document Reviewed: 06/01/2017  Elsevier Interactive Patient Education © 2019 [employer]

## 2018-10-05 NOTE — Progress Notes (Signed)
Cystoscopy Procedure Note:  Indication: Dysuria, urgency, frequency   After informed consent and discussion of the procedure and its risks, Jared [name] was positioned and prepped in the standard fashion. Cystoscopy was performed with a flexible cystoscope. The urethra, bladder neck and entire bladder was visualized in a standard fashion. The prostate was small to moderate in size. The bladder mucosa was grossly normal throughout. Retroflexion demonstrated no concerning lesions. The ureteral orifices were visualized in their normal location and orientation.   Findings: Normal cystoscopy  Assessment and Plan: In summary he is a 63 year old male with a multiple year history of occasional dysuria, urgency, and frequency.  His cystoscopy was normal today and he has never had hematuria.  His symptoms are most consistent with bladder pain syndrome/interstitial cystitis.  We discussed this at length today, and information was provided as well as dietary recommendations.  I again counseled him to consider smoking cessation.  His symptoms have improved over the last few weeks, and he would like to hold off on starting any other medications at this time.  RTC 2 months for symptom check Consider OAB medications/amitriptyline if interested at that time  Jared Madrid, MD 10/05/2018

## 2019-01-06 ENCOUNTER — Encounter: Payer: Self-pay | Admitting: Urology

## 2019-01-06 ENCOUNTER — Other Ambulatory Visit: Payer: Self-pay

## 2019-01-06 ENCOUNTER — Ambulatory Visit: Payer: 59 | Admitting: Urology

## 2019-01-06 DIAGNOSIS — N3010 Interstitial cystitis (chronic) without hematuria: Secondary | ICD-10-CM

## 2019-01-06 DIAGNOSIS — R350 Frequency of micturition: Secondary | ICD-10-CM | POA: Diagnosis not present

## 2019-01-06 MED ORDER — URIBEL 118 MG PO CAPS: 118.0000 mg | ORAL_CAPSULE | Freq: Four times a day (QID) | ORAL | 2 refills | Status: DC | PRN

## 2019-01-06 NOTE — Patient Instructions (Signed)
Interstitial Cystitis    Interstitial cystitis is inflammation of the bladder. This may cause pain in the bladder area as well as a frequent and urgent need to urinate. The bladder is a hollow organ in the lower part of the abdomen. It stores urine after the urine is made in the kidneys.  The severity of interstitial cystitis can vary from person to person. You may have flare-ups, and then your symptoms may go away for a while. For many people, it becomes a long-term (chronic) problem.  What are the causes?  The cause of this condition is not known.  What increases the risk?  The following factors may make you more likely to develop this condition:  · You are male.  · You have fibromyalgia.  · You have irritable bowel syndrome (IBS).  · You have endometriosis.  This condition may be aggravated by:  · Stress.  · Smoking.  · Spicy foods.  What are the signs or symptoms?  Symptoms of interstitial cystitis vary, and they can change over time. Symptoms may include:  · Discomfort or pain in the bladder area, which is in the lower abdomen. Pain can range from mild to severe. The pain may change in intensity as the bladder fills with urine or as it empties.  · Pain in the pelvic area, between the hip bones.  · An urgent need to urinate.  · Frequent urination.  · Pain during urination.  · Pain during sex.  · Blood in the urine.  For women, symptoms often get worse during menstruation.  How is this diagnosed?  This condition is diagnosed based on your symptoms, your medical history, and a physical exam. You may have tests to rule out other conditions, such as:  · Urine tests.  · Cystoscopy. For this test, a tool similar to a very thin telescope is used to look into your bladder.  · Biopsy. This involves taking a sample of tissue from the bladder to be examined under a microscope.  How is this treated?  There is no cure for this condition, but treatment can help you control your symptoms. Work closely with your health care  provider to find the most effective treatments for you. Treatment options may include:  · Medicines to relieve pain and reduce how often you feel the need to urinate.  · Learning ways to control when you urinate (bladder training).  · Lifestyle changes, such as changing your diet or taking steps to control stress.  · Using a device that provides electrical stimulation to your nerves, which can relieve pain (neuromodulation therapy). The device is placed on your back, where it blocks the nerves that cause you to feel pain in your bladder area.  · A procedure that stretches your bladder by filling it with air or fluid.  · Surgery. This is rare. It is only done for extreme cases, if other treatments do not help.  Follow these instructions at home:  Bladder training    · Use bladder training techniques as directed. Techniques may include:  ? Urinating at scheduled times.  ? Training yourself to delay urination.  ? Doing exercises (Kegel exercises) to strengthen the muscles that control urine flow.  · Keep a bladder diary.  ? Write down the times that you urinate and any symptoms that you have. This can help you find out which foods, liquids, or activities make your symptoms worse.  ? Use your bladder diary to schedule bathroom trips. If you are away   from home, plan to be near a bathroom at each of your scheduled times.  · Make sure that you urinate just before you leave the house and just before you go to bed.  Eating and drinking  · Make dietary changes as recommended by your health care provider. You may need to avoid:  ? Spicy foods.  ? Foods that contain a lot of potassium.  · Limit your intake of beverages that make you need to urinate. These include:  ? Caffeinated beverages like soda, coffee, and tea.  ? Alcohol.  General instructions  · Take over-the-counter and prescription medicines only as told by your health care provider.  · Do not drink alcohol.  · You can try a warm or cool compress over your bladder for  comfort.  · Avoid wearing tight clothing.  · Do not use any products that contain nicotine or tobacco, such as cigarettes and e-cigarettes. If you need help quitting, ask your health care provider.  · Keep all follow-up visits as told by your health care provider. This is important.  Contact a health care provider if you have:  · Symptoms that do not get better with treatment.  · Pain or discomfort that gets worse.  · More frequent urges to urinate.  · A fever.  Get help right away if:  · You have no control over when you urinate.  Summary  · Interstitial cystitis is inflammation of the bladder.  · This condition may cause pain in the bladder area as well as a frequent and urgent need to urinate.  · You may have flare-ups of the condition, and then it may go away for a while. For many people, it becomes a long-term (chronic) problem.  · There is no cure for interstitial cystitis, but treatment methods are available to control your symptoms.  This information is not intended to replace advice given to you by your health care provider. Make sure you discuss any questions you have with your health care provider.  Document Released: 03/07/2004 Document Revised: 06/01/2017 Document Reviewed: 06/01/2017  Elsevier Interactive Patient Education © 2019 [employer]

## 2019-01-06 NOTE — Progress Notes (Signed)
   01/06/2019 4:23 PM   [name] July 20, 1956 [id number]  Reason for visit: Follow up LUTS/interstitial cystitis  HPI: I saw Mr. Perreira back in urology clinic for LUTS/interstitial cystitis.  He is a 63 year old male with a smoking history who presented in March 2020 with a chronic history of occasional dysuria, urgency, frequency, and pelvic pain.  Work-up with cystoscopy was negative, and showed a small prostate.  He was trialed on Uribel as needed at that time which is almost completely resolved his urinary symptoms.  He is very happy with his quality of life currently and denies any significant complaints.   ROS: Please see flowsheet from today's date for complete review of systems.  Physical Exam: BP (!) 149/80 (BP Location: Left Arm, Patient Position: Sitting)   Pulse 69   Ht 6\' 6"  (1.981 m)   Wt 204 lb (92.5 kg)   BMI 23.57 kg/m     Assessment & Plan:   63 year old male with history of bladder pain and dysuria of unclear etiology.  He has an extensive smoking history, but cystoscopy work-up was negative for any malignancy or stricture.  He has had significant relief of his symptoms with p.o. Uribel as needed.  We discussed avoiding smoking, alcohol, coffee, soda, and tea, as these can irritate the bladder.  Information on bladder pain syndrome/interstitial cystitis was provided.  RTC 1 year for symptom check, sooner if problems  A total of 15 minutes were spent face-to-face with the patient, greater than 50% was spent in patient education, counseling, and coordination of care regarding bladder pain and interstitial cystitis.  Billey Co, [facility] [address], [place], [place] [address] [phone]

## 2019-12-16 ENCOUNTER — Other Ambulatory Visit: Payer: Self-pay | Admitting: Neurology

## 2019-12-16 DIAGNOSIS — G459 Transient cerebral ischemic attack, unspecified: Secondary | ICD-10-CM

## 2019-12-27 DIAGNOSIS — Z8673 Personal history of transient ischemic attack (TIA), and cerebral infarction without residual deficits: Secondary | ICD-10-CM | POA: Insufficient documentation

## 2019-12-27 DIAGNOSIS — I493 Ventricular premature depolarization: Secondary | ICD-10-CM | POA: Insufficient documentation

## 2020-01-01 DIAGNOSIS — G459 Transient cerebral ischemic attack, unspecified: Secondary | ICD-10-CM | POA: Insufficient documentation

## 2020-01-04 ENCOUNTER — Encounter: Payer: Self-pay | Admitting: Urology

## 2020-01-04 ENCOUNTER — Ambulatory Visit: Payer: PRIVATE HEALTH INSURANCE | Admitting: Urology

## 2020-01-09 ENCOUNTER — Ambulatory Visit: Payer: BC Managed Care – PPO

## 2020-03-27 ENCOUNTER — Other Ambulatory Visit: Payer: Self-pay

## 2020-03-27 ENCOUNTER — Ambulatory Visit
Admission: RE | Admit: 2020-03-27 | Discharge: 2020-03-27 | Disposition: A | Discharge status: Home / Self Care | Payer: BC Managed Care – PPO | Source: Ambulatory Visit | Attending: Neurology | Admitting: Neurology

## 2020-03-27 DIAGNOSIS — G459 Transient cerebral ischemic attack, unspecified: Secondary | ICD-10-CM | POA: Diagnosis not present

## 2020-03-27 IMAGING — MR MR MRA NECK W/O CM
3 of 4 series · 3 of 48 positions shown · non-contrast
Comparison: Brain MRI and intracranial MRA today reported
separately.

CLINICAL DATA: 64-year-old male with TIA symptoms in [REDACTED].

EXAM:
MRA NECK WITHOUT CONTRAST
TECHNIQUE: Angiographic images of the neck were obtained using MRA technique
without intravenous contrast. Carotid stenosis measurements (when
applicable) are obtained utilizing NASCET criteria, using the distal
internal carotid diameter as the denominator.

[Series 1088: verts tumble · axial · 0.5mm · 0.52mm/px · 1 of 1 slices shown]
[im 1/1]
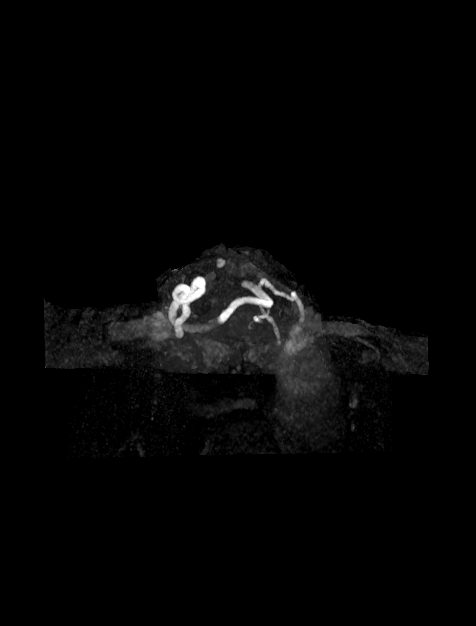

[Series 1098: rcca tumble · axial · 0.5mm · 0.52mm/px · 1 of 3 slices shown]
[im 1/3]
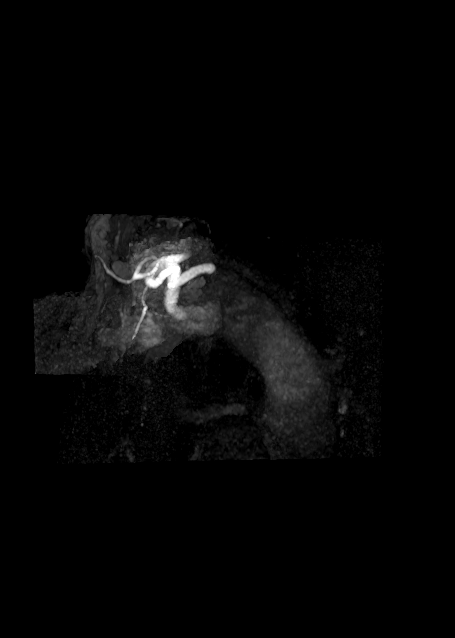

[Series 1108: lcca tumble · axial · 0.5mm · 0.52mm/px · 1 of 3 slices shown]
[im 1/3]
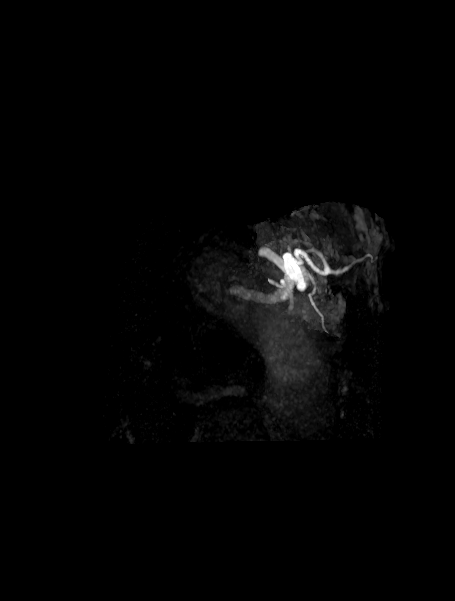

[3 of 48 positions shown; findings below may reference images not displayed]

FINDINGS: Axial time-of-flight images from the upper chest to the skull base
demonstrate antegrade flow in both cervical carotid and vertebral
arteries.

The right vertebral artery appears dominant throughout (normal
variant). Three vessel arch configuration.

The right CCA, right carotid bifurcation, and cervical right ICA
appear within normal limits.

The left CCA, left carotid bifurcation, and cervical left ICA appear
within normal limits.

The cervical vertebral arteries are within normal limits, the right
is dominant. The vertebrobasilar junction is also included and
patent.
IMPRESSION: Negative noncontrast Neck MRA.

## 2020-03-27 IMAGING — MR MR HEAD W/O CM
10 of 12 series · 35 of 48 positions shown · non-contrast
Comparison: MRA today reported separately.
COMPARISON: MRA today reported separately.

Addendum:
CLINICAL DATA: 64-year-old male with TIA symptoms in [REDACTED].

EXAM:
MRI HEAD WITHOUT CONTRAST
TECHNIQUE: Multiplanar, multiecho pulse sequences of the brain and surrounding
structures were obtained without intravenous contrast.

[Series 5: ax dwi_tracew · axial · 3.0mm · 0.60mm/px · z∈[-50,+103]mm · 3 of 48 slices shown]
[im 1/48]
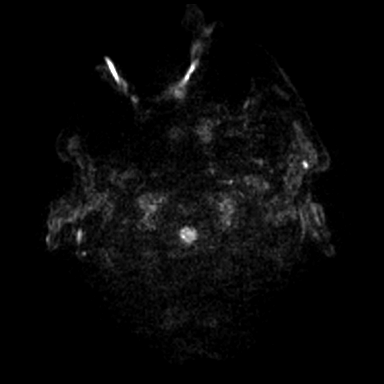
[im 24/48]
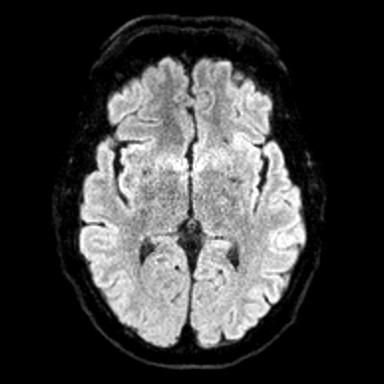
[im 48/48]
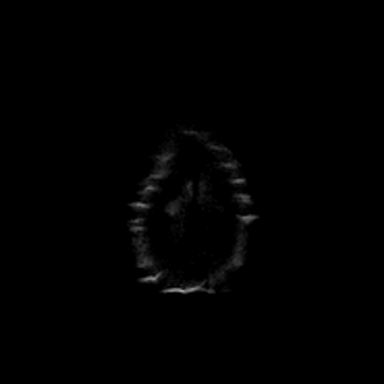

[Series 6: ax dwi_adc · axial · 3.0mm · 0.60mm/px · z∈[-50,+103]mm · 4 of 48 slices shown]
[im 1/48]
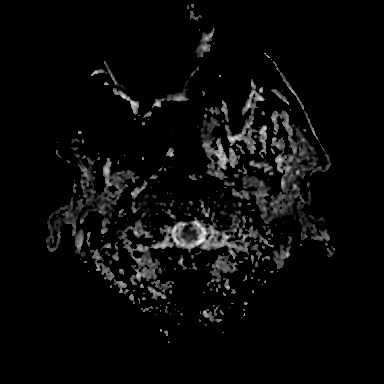
[im 16/48]
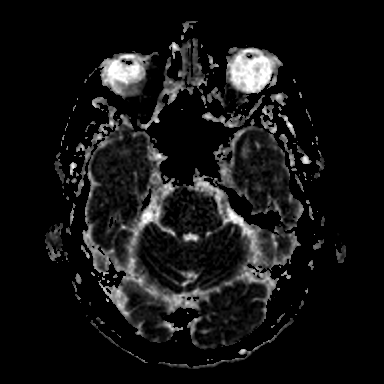
[im 32/48]
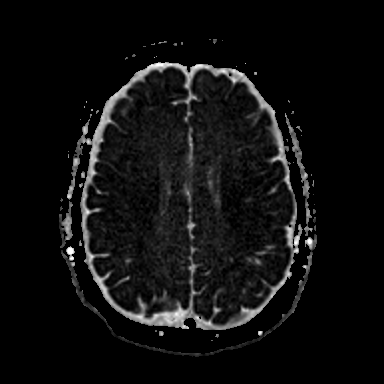
[im 48/48]
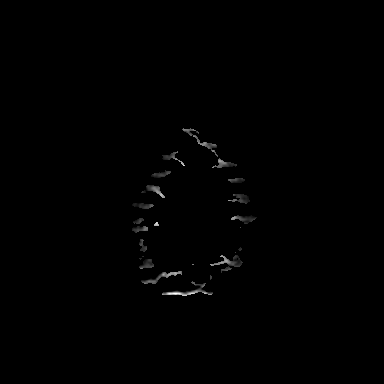

[Series 7: cor dwi_tracew · coronal · 5.0mm · 0.60mm/px · 3 of 38 slices shown]
[im 1/38]
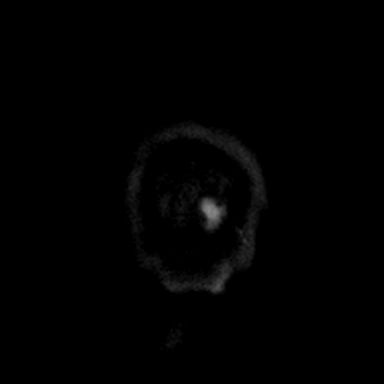
[im 19/38]
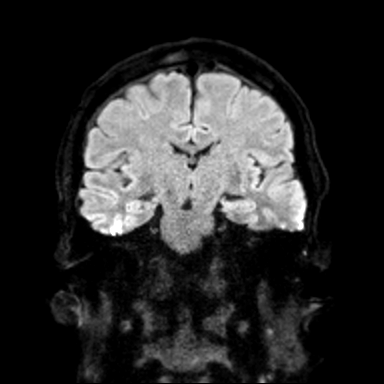
[im 38/38]
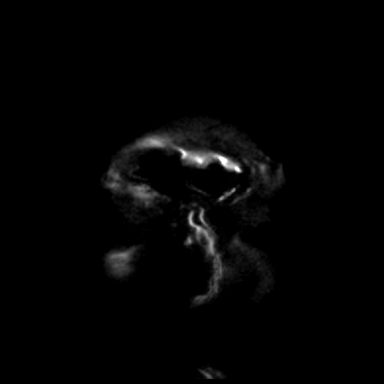

[Series 8: cor dwi_adc · coronal · 5.0mm · 0.60mm/px · 3 of 38 slices shown]
[im 1/38]
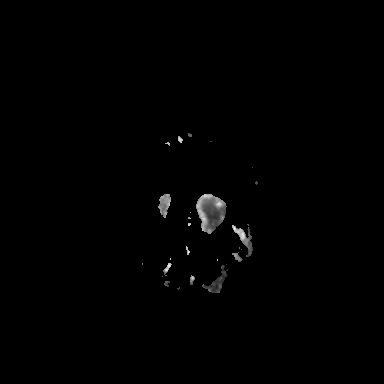
[im 19/38]
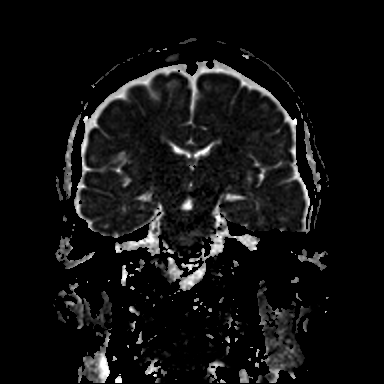
[im 38/38]
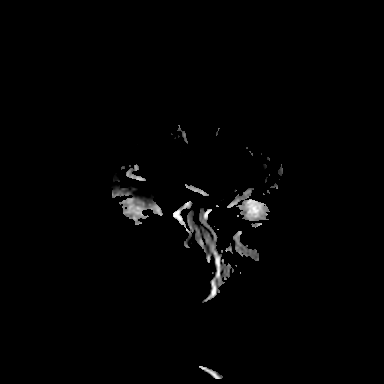

[Series 9: T1 · sagittal · 5.0mm · 0.62mm/px · 2 of 21 slices shown (1 of 2)]
[im 1/21]
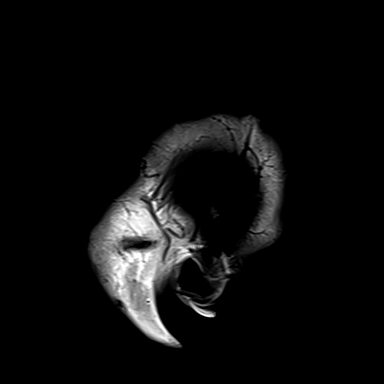
[im 21/21]
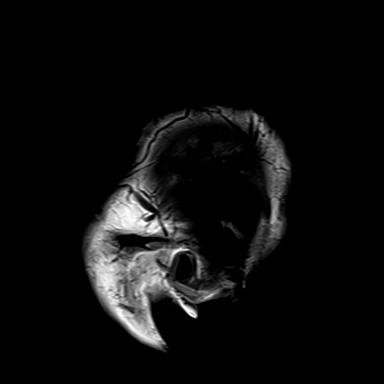

[Series 10: T2 · axial · 5.0mm · 0.53mm/px · z∈[-55,+99]mm · 2 of 27 slices shown (1 of 2)]
[im 1/27]
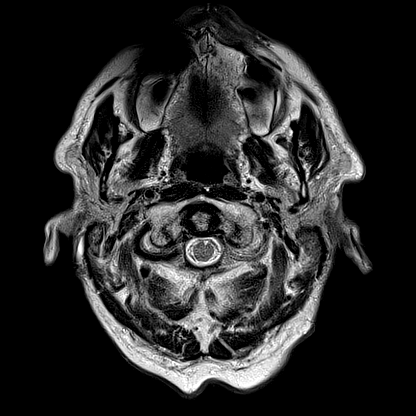
[im 27/27]
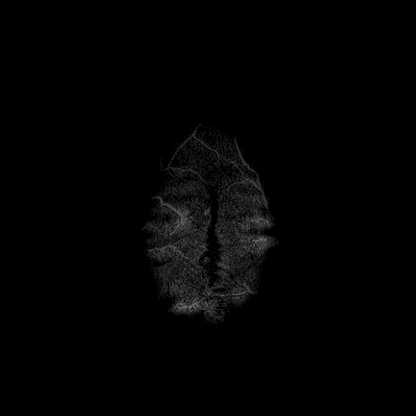

[Series 11: mag_images · axial · 3.0mm · 0.90mm/px · z∈[-66,+109]mm · 4 of 60 slices shown]
[im 1/60]
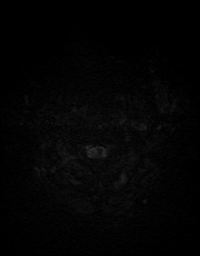
[im 20/60]
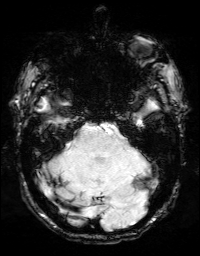
[im 40/60]
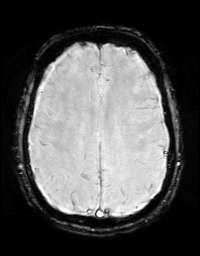
[im 60/60]
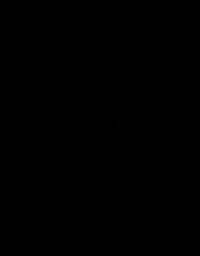

[Series 15: FLAIR · axial · 3.0mm · 0.53mm/px · z∈[-59,+101]mm · 4 of 55 slices shown]
[im 1/55]
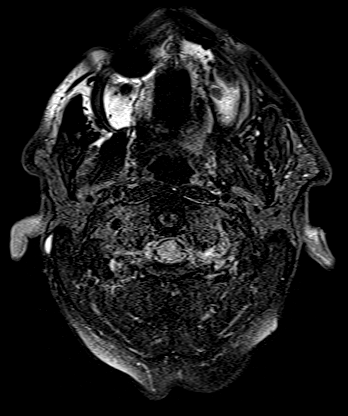
[im 19/55]
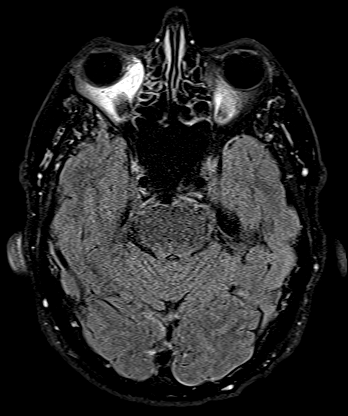
[im 37/55]
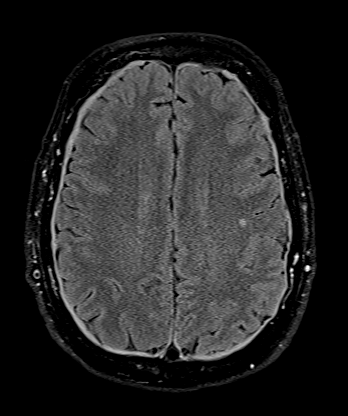
[im 55/55]
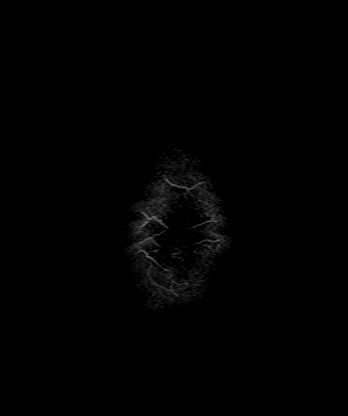

[Series 16: T1 · axial · 1.0mm · 0.98mm/px · z∈[-64,+110]mm · 8 of 176 slices shown (2 of 2)]
[im 1/176]
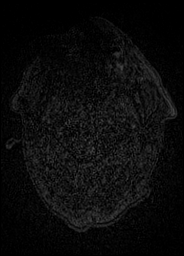
[im 30/176]
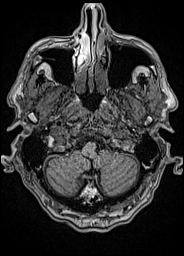
[im 59/176]
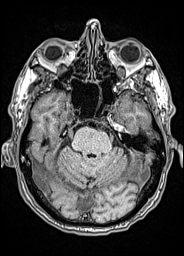
[im 73/176]
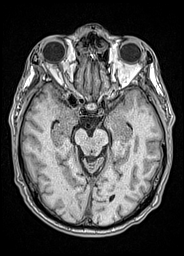
[im 103/176]
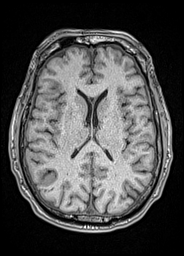
[im 117/176]
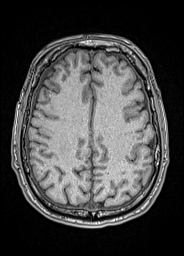
[im 146/176]
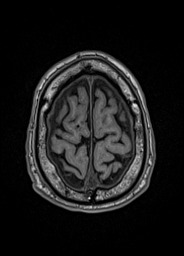
[im 176/176]
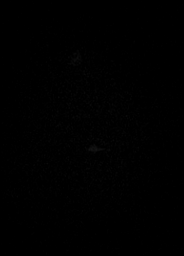

[Series 17: T2 · coronal · 5.0mm · 0.45mm/px · 2 of 29 slices shown (2 of 2)]
[im 1/29]
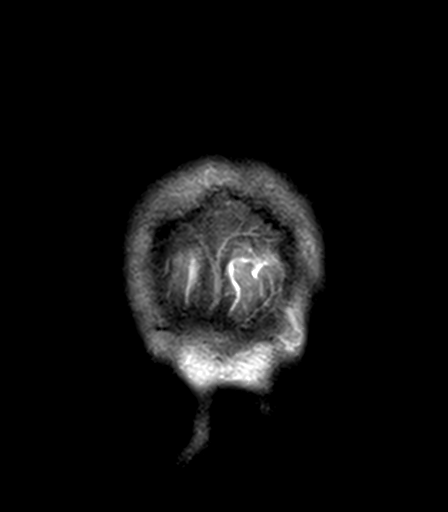
[im 29/29]
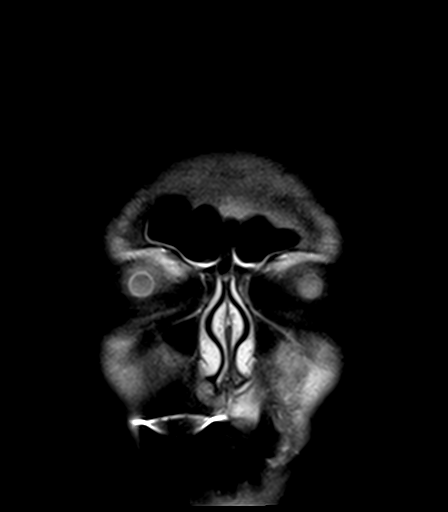

[35 of 48 positions shown; findings below may reference images not displayed]

FINDINGS: Brain: Positive for small bilateral subdural hematomas or dural
thickening (series 15, image 36) measuring 2-3 mm in thickness on
both sides. Bilaterally the involvement is holo hemispheric, and
infact the posterior fossa is also diffusely affected, with similar
2-3 mm of blood layering along the dorsal clivus (series 15, image
15).

There is no midline shift (series 15, image 33) and basilar cisterns
remain patent.

Underlying normal for age cerebral volume. No superimposed
restricted diffusion to suggest acute infarction. No
ventriculomegaly or intraventricular blood or debris.
Cervicomedullary junction and pituitary are within normal limits. No
superimposed chronic hemosiderin on SWI. No cortical
encephalomalacia. Largely normal for age gray and white matter
signal, with only minimal nonspecific scattered white matter T2 and
FLAIR hyperintensity (series 15, image 37).

Vascular: Major intracranial vascular flow voids are preserved with
mild generalized intracranial artery tortuosity. The right vertebral
appears dominant.

Skull and upper cervical spine: Negative visible cervical spine,
bone marrow signal.

Sinuses/Orbits: Negative orbits. Mild scattered paranasal sinus
mucosal thickening. No sinus fluid levels.

Other: Mastoids are clear. Visible internal auditory structures
appear grossly normal. Normal stylomastoid foramina. Scalp and face
appear negative.
IMPRESSION: 1. Positive for diffuse bilateral subdural hematoma versus dural
thickening, 2-3 mm in thickness throughout both the entire posterior
fossa and bilateral supratentorial brain.
Follow-up noncontrast Head CT might be able to confirm hyperdense
blood products, although small SDH this size will frequently be
occult by CT.
Follow-up Post-contrast brain MRI would be valuable although a
degree of dural thickening is typically seen with SDH.
Otherwise CSF analysis might also be valuable.

2. No significant intracranial mass effect or other acute
intracranial abnormality.

3. MRAs today are reported separately.

ADDENDUM:
Study discussed by telephone with Dr. KEYLIS on [DATE] at
[RU] hours. We agreed that the signal on MRI and MRA favors diffuse
pachymeningeal thickening over widespread trace subdural blood. And
we briefly discussed further investigation as well as the broad
differential diagnosis of such dural thickening.

*** End of Addendum ***
FINDINGS: Brain: Positive for small bilateral subdural hematomas or dural
thickening (series 15, image 36) measuring 2-3 mm in thickness on
both sides. Bilaterally the involvement is holo hemispheric, and
infact the posterior fossa is also diffusely affected, with similar
2-3 mm of blood layering along the dorsal clivus (series 15, image
15).

There is no midline shift (series 15, image 33) and basilar cisterns
remain patent.

Underlying normal for age cerebral volume. No superimposed
restricted diffusion to suggest acute infarction. No
ventriculomegaly or intraventricular blood or debris.
Cervicomedullary junction and pituitary are within normal limits. No
superimposed chronic hemosiderin on SWI. No cortical
encephalomalacia. Largely normal for age gray and white matter
signal, with only minimal nonspecific scattered white matter T2 and
FLAIR hyperintensity (series 15, image 37).

Vascular: Major intracranial vascular flow voids are preserved with
mild generalized intracranial artery tortuosity. The right vertebral
appears dominant.

Skull and upper cervical spine: Negative visible cervical spine,
bone marrow signal.

Sinuses/Orbits: Negative orbits. Mild scattered paranasal sinus
mucosal thickening. No sinus fluid levels.

Other: Mastoids are clear. Visible internal auditory structures
appear grossly normal. Normal stylomastoid foramina. Scalp and face
appear negative.
IMPRESSION: 1. Positive for diffuse bilateral subdural hematoma versus dural
thickening, 2-3 mm in thickness throughout both the entire posterior
fossa and bilateral supratentorial brain.
Follow-up noncontrast Head CT might be able to confirm hyperdense
blood products, although small SDH this size will frequently be
occult by CT.
Follow-up Post-contrast brain MRI would be valuable although a
degree of dural thickening is typically seen with SDH.
Otherwise CSF analysis might also be valuable.

2. No significant intracranial mass effect or other acute
intracranial abnormality.

3. MRAs today are reported separately.

## 2020-03-27 IMAGING — MR MR MRA HEAD W/O CM
1 series · 19 of 48 positions shown · non-contrast
Comparison: Brain MRI and neck MRA today reported separately.

CLINICAL DATA: 64-year-old male with TIA symptoms in [REDACTED].

EXAM:
MRA HEAD WITHOUT CONTRAST
TECHNIQUE: Angiographic images of the Circle of Willis were obtained using MRA
technique without intravenous contrast.

[Series 5: TOF · axial · 0.5mm · 0.41mm/px · z∈[-58,+39]mm · 19 of 205 slices shown]
[im 1/205]
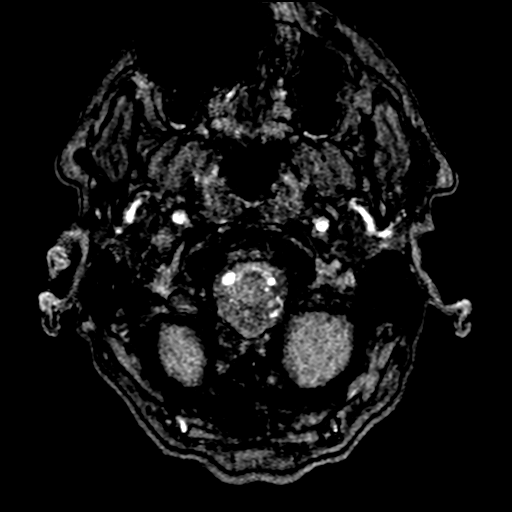
[im 5/205]
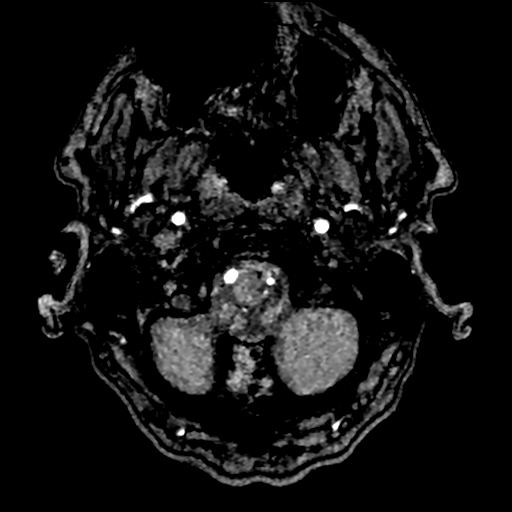
[im 9/205]
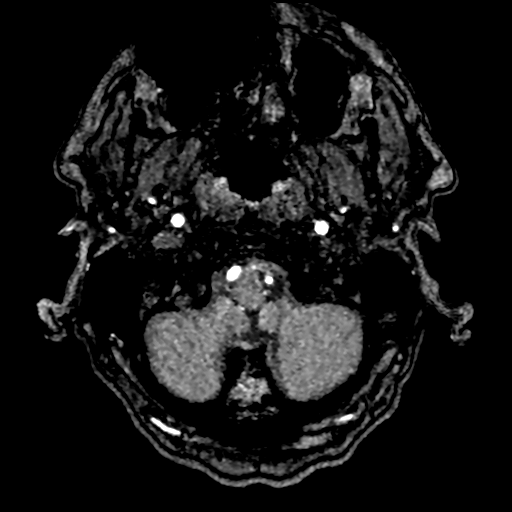
[im 14/205]
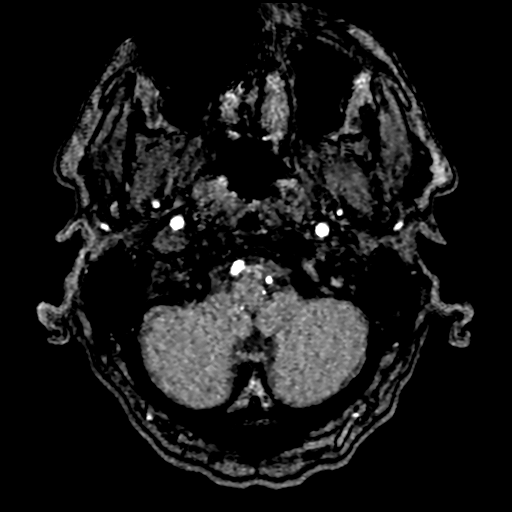
[im 18/205]
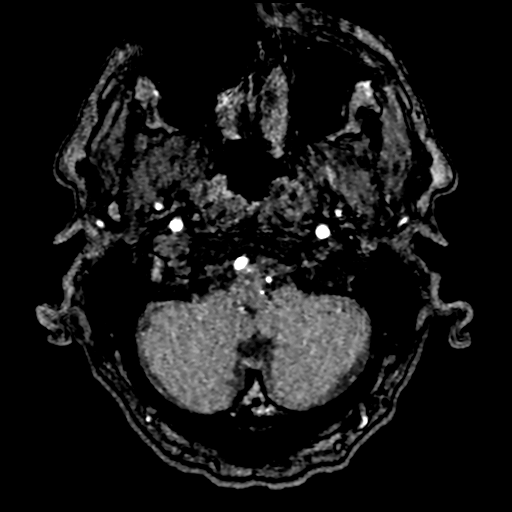
[im 22/205]
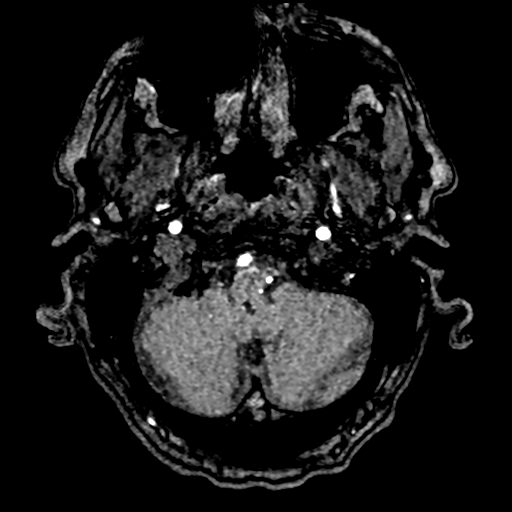
[im 27/205]
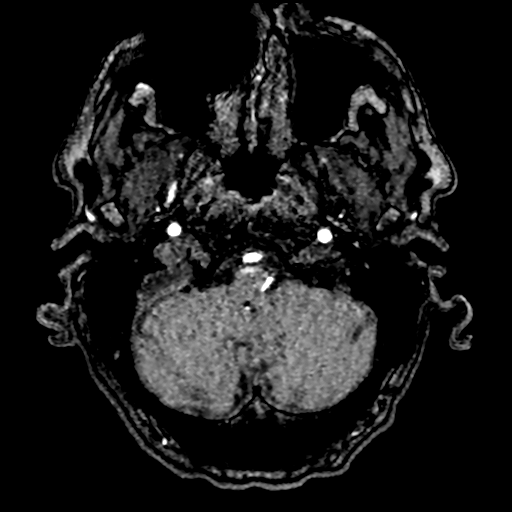
[im 31/205]
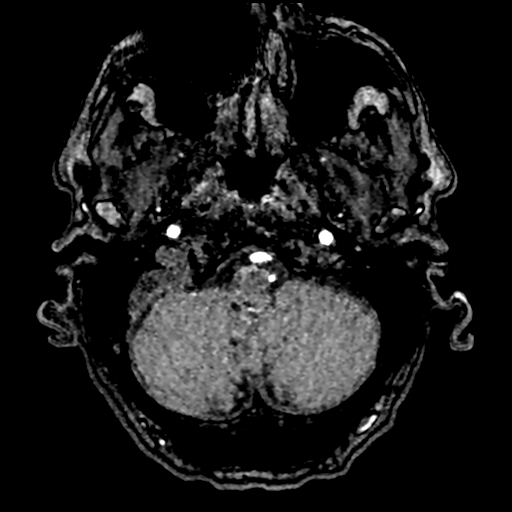
[im 35/205]
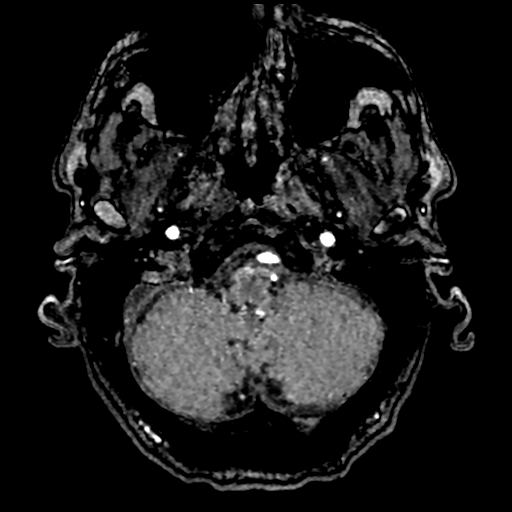
[im 40/205]
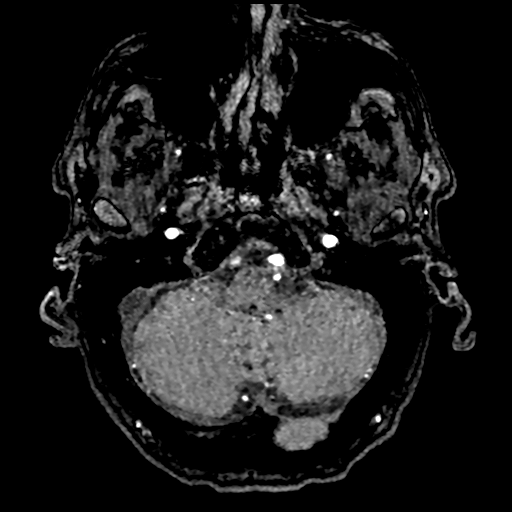
[im 44/205]
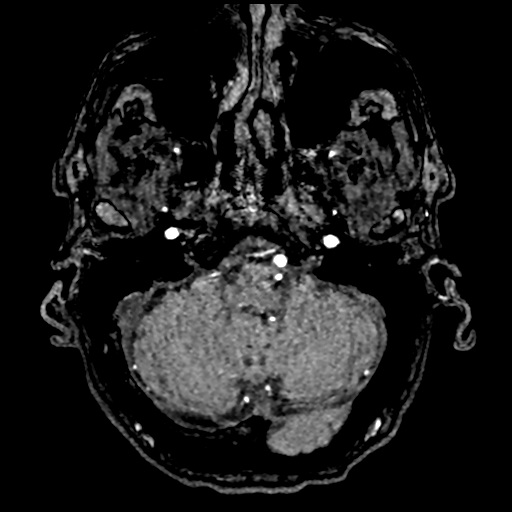
[im 66/205]
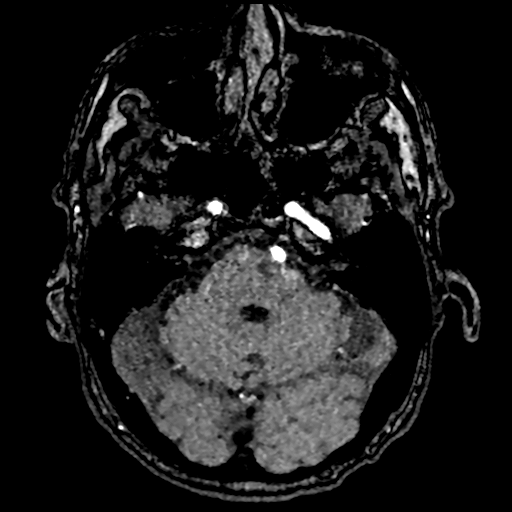
[im 92/205]
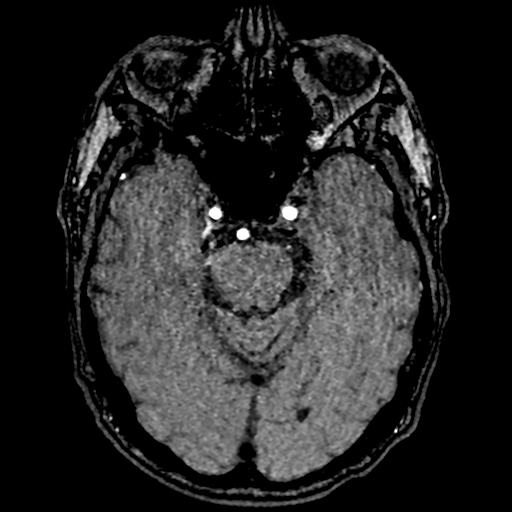
[im 105/205]
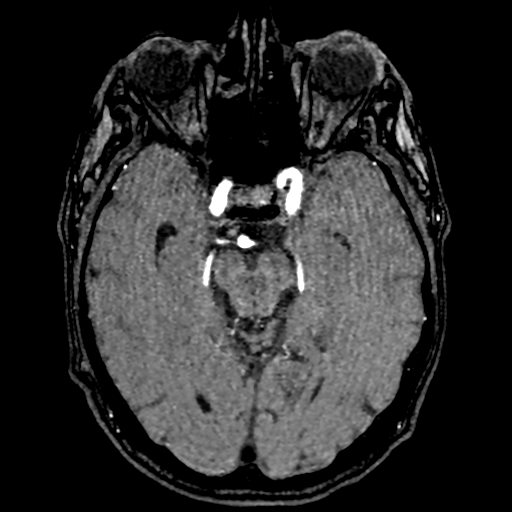
[im 118/205]
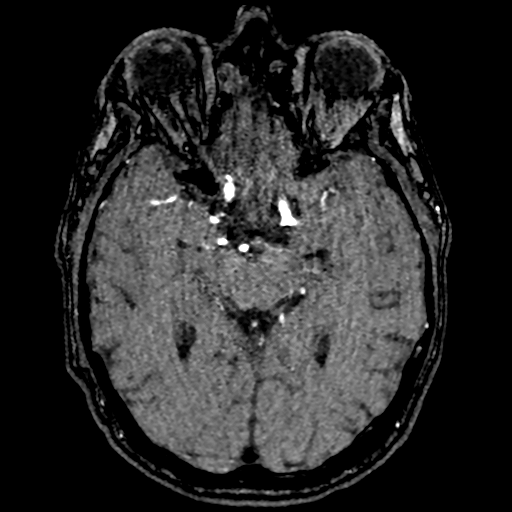
[im 144/205]
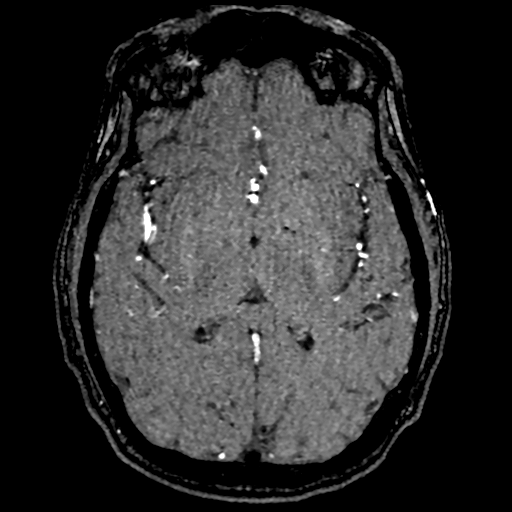
[im 170/205]
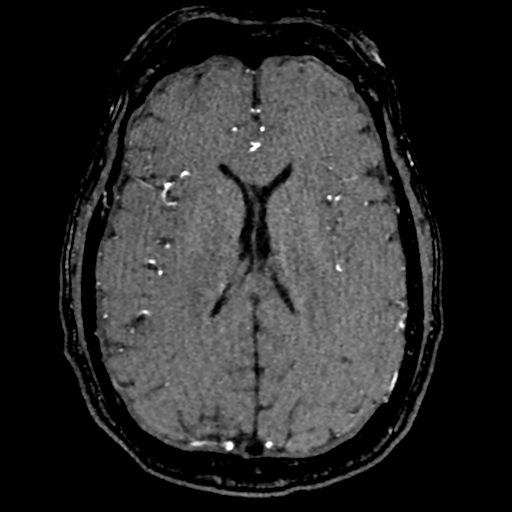
[im 174/205]
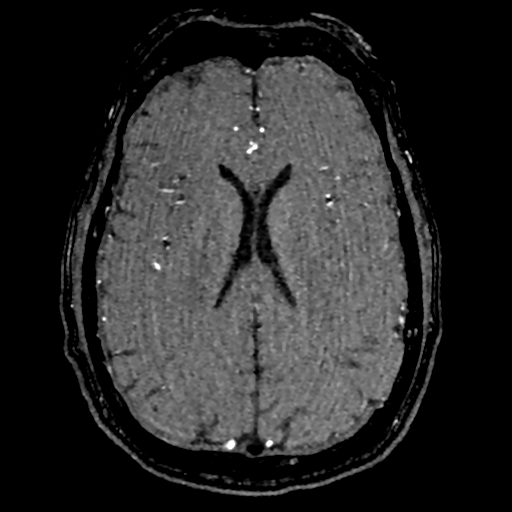
[im 196/205]
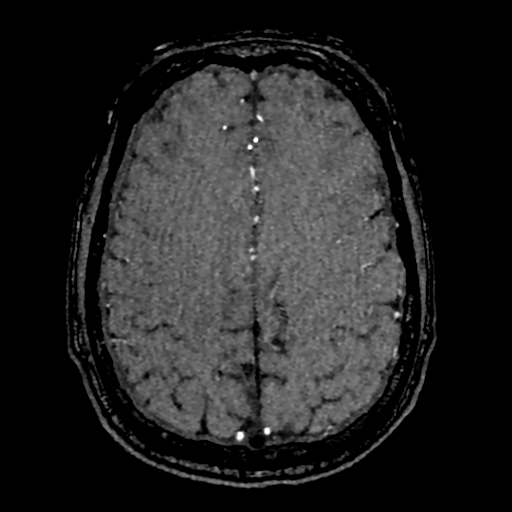

[19 of 48 positions shown; findings below may reference images not displayed]

FINDINGS: Included levels demonstrate no intrinsic increased signal in the
bilateral subdural collections or dural thickening, which might
argue against the presence of blood products.

Antegrade flow in the posterior circulation with dominant right
vertebral artery is seen on the neck MRA today. No distal vertebral
stenosis. Normal left PICA origin. Normal vertebrobasilar junction
and origin of the right AICA which seems dominant (normal variant).
Tortuous distal vertebrals and basilar artery without stenosis.
Patent SCA and PCA origins. Posterior communicating arteries are
diminutive or absent. Bilateral PCA branches are within normal
limits.

Antegrade flow in both ICA siphons. Both ICA siphons are mildly
tortuous without stenosis. Normal ophthalmic artery origins. Patent
carotid termini. Anterior communicating artery and visible ACA
branches are normal aside from some tortuosity. The left A1 appears
minimally dominant. Both MCA M1 segments are patent. Widely patent
right MCA trifurcation. Patent left MCA bifurcation. Visible
bilateral MCA branches are normal aside from mild tortuosity.
IMPRESSION: 1. Negative intracranial MRA aside from a degree of generalized
arterial tortuosity.
2. See also brain MRI today reported separately.

## 2020-06-21 ENCOUNTER — Other Ambulatory Visit: Payer: Self-pay | Admitting: Neurology

## 2020-06-21 DIAGNOSIS — R200 Anesthesia of skin: Secondary | ICD-10-CM

## 2020-07-17 ENCOUNTER — Ambulatory Visit: Payer: Medicare Other

## 2020-09-03 ENCOUNTER — Other Ambulatory Visit: Payer: Self-pay | Admitting: Family Medicine

## 2020-09-03 ENCOUNTER — Ambulatory Visit
Admission: RE | Admit: 2020-09-03 | Discharge: 2020-09-03 | Disposition: A | Discharge status: Home / Self Care | Payer: BC Managed Care – PPO | Source: Ambulatory Visit | Attending: Family Medicine | Admitting: Family Medicine

## 2020-09-03 ENCOUNTER — Other Ambulatory Visit: Payer: Self-pay

## 2020-09-03 DIAGNOSIS — M79662 Pain in left lower leg: Secondary | ICD-10-CM

## 2020-09-03 IMAGING — US US EXTREM LOW VENOUS*L*
1 series · 13 of 24 positions shown · non-contrast
Comparison: None.

CLINICAL DATA: Left leg pain



[Series 1: us extrem low venous*left* · 0.10mm/px · 13 of 75 slices shown]
[im 1/75]
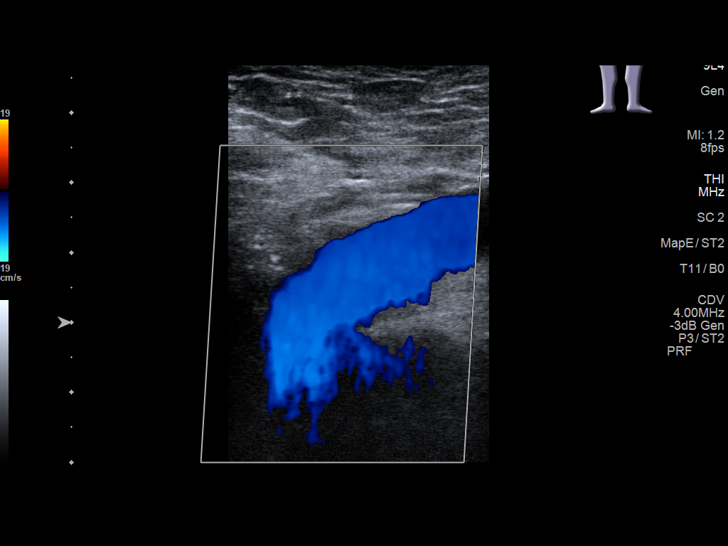
[im 7/75]
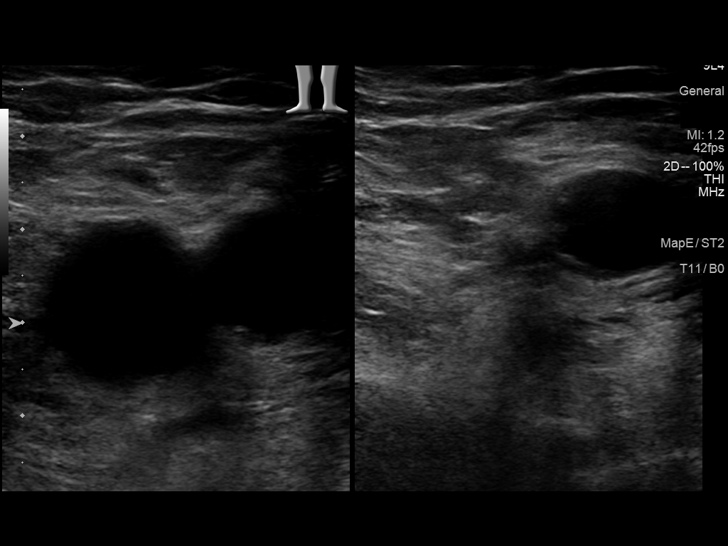
[im 13/75]
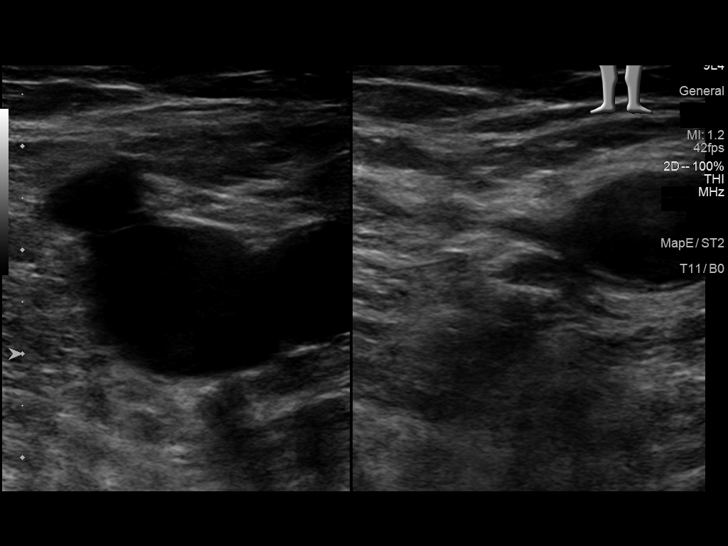
[im 20/75]
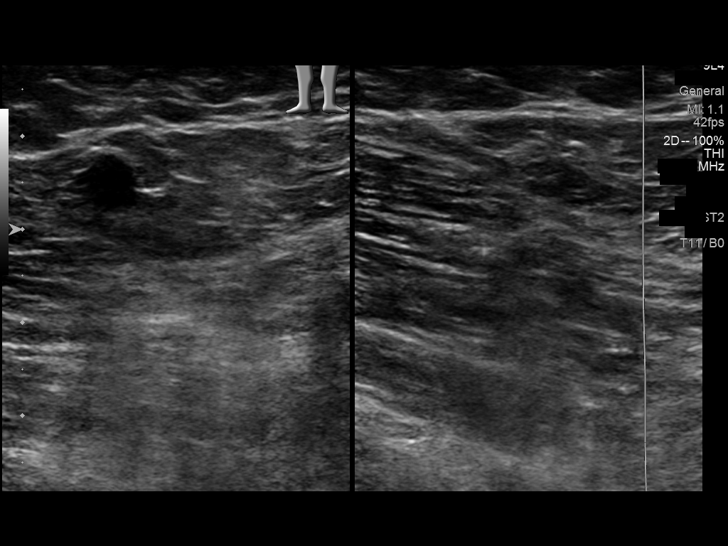
[im 26/75]
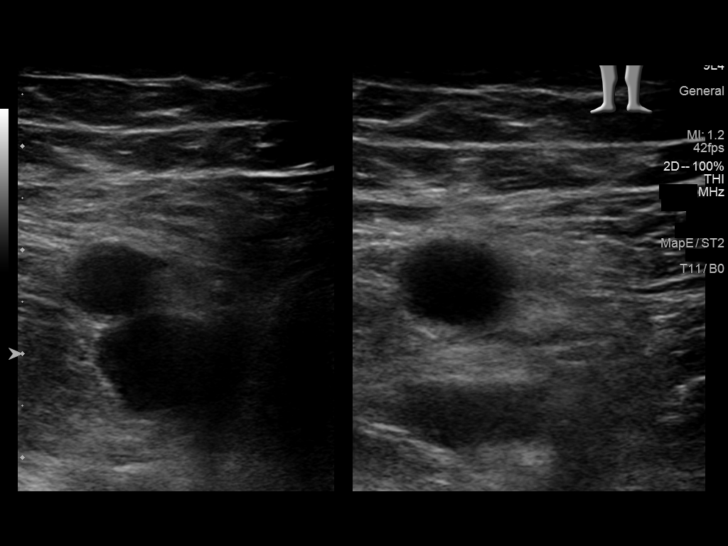
[im 33/75]
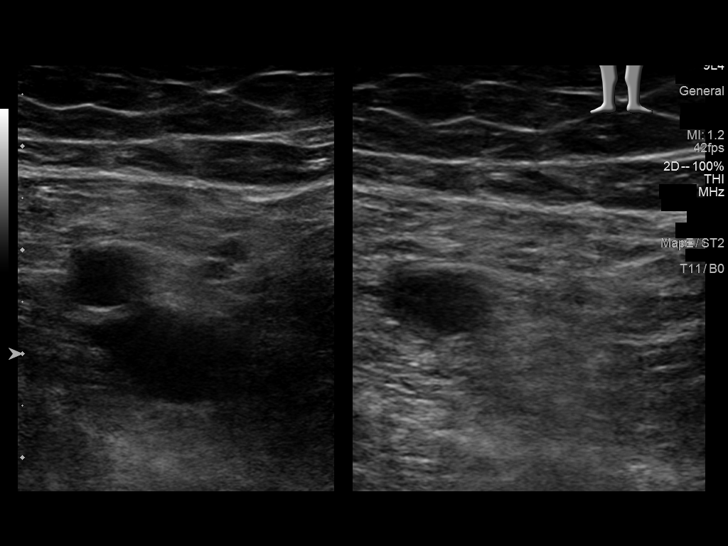
[im 39/75]
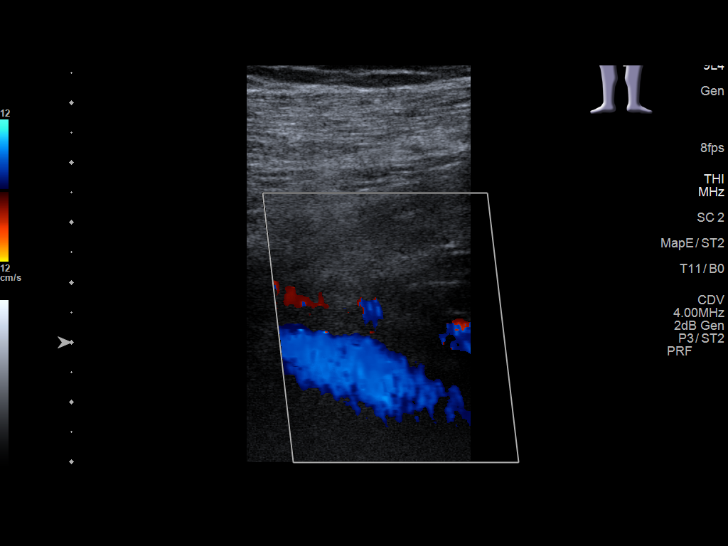
[im 42/75]
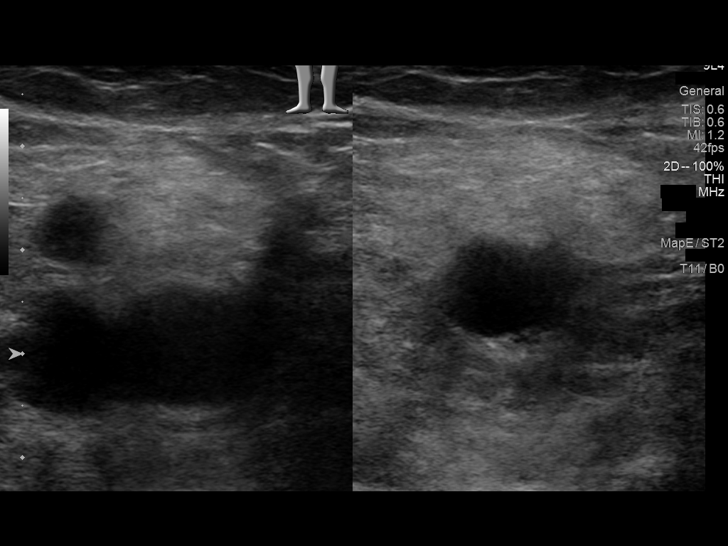
[im 49/75]
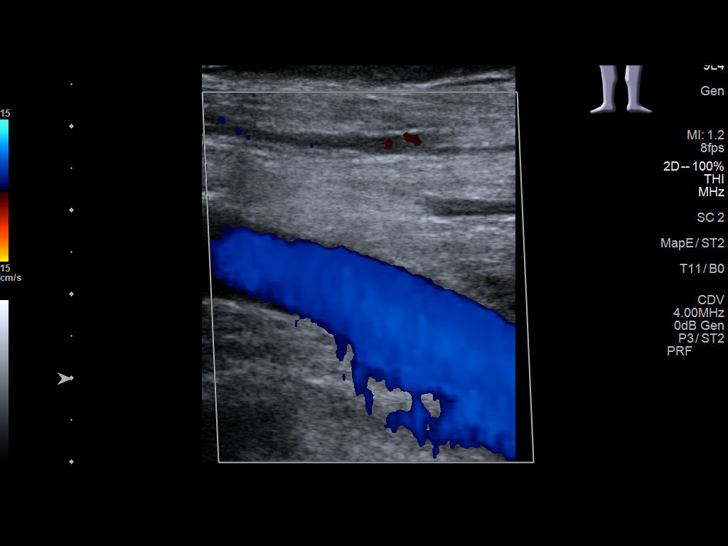
[im 55/75]
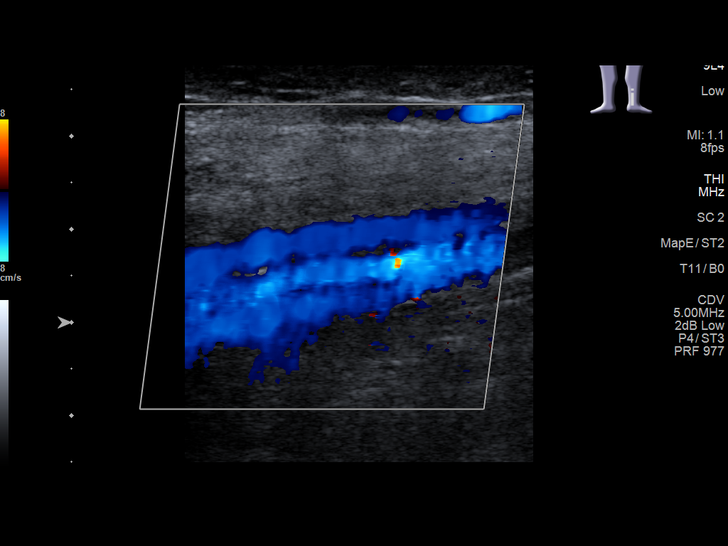
[im 62/75]
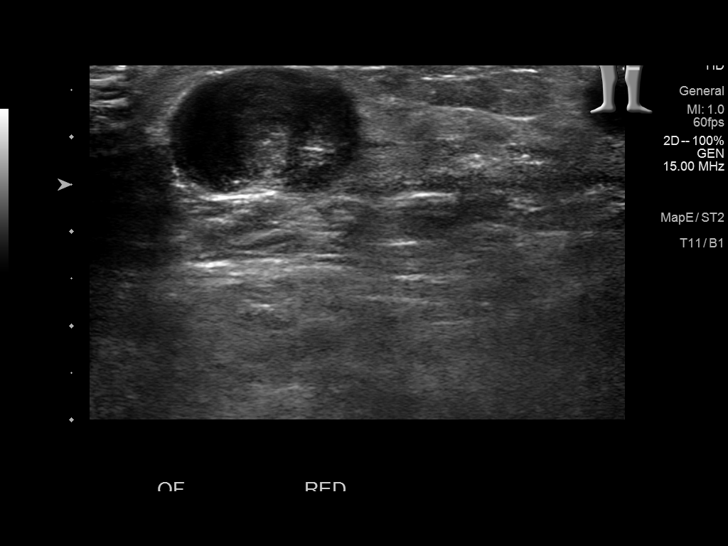
[im 68/75]
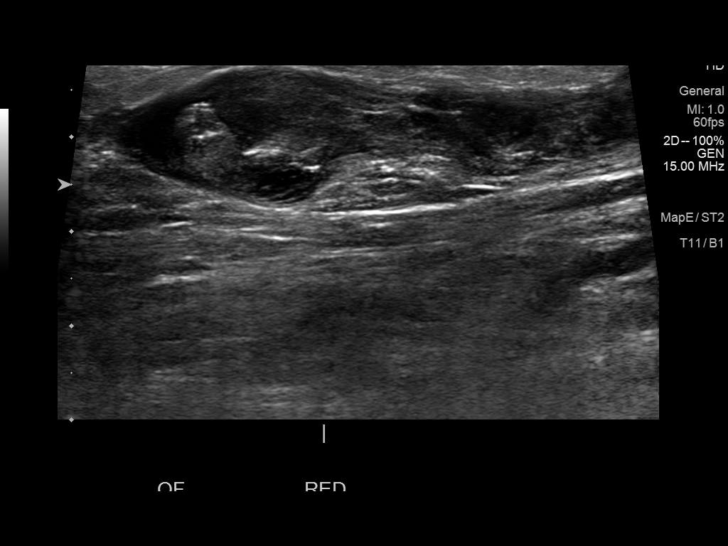
[im 75/75]
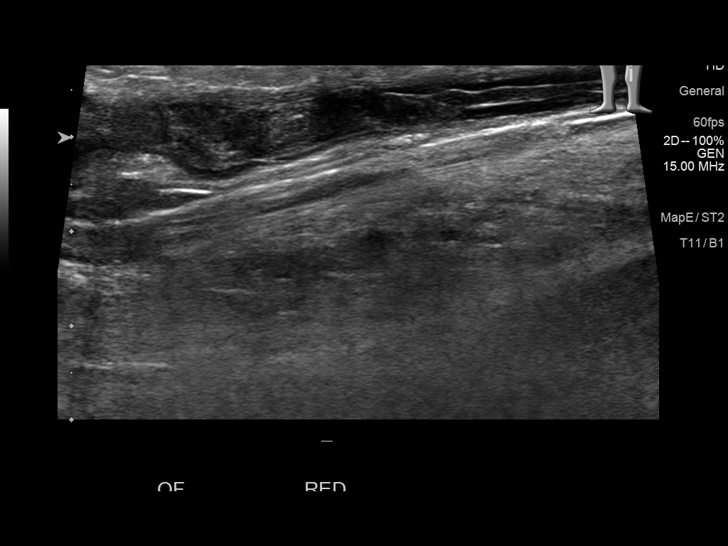

[13 of 24 positions shown; findings below may reference images not displayed]

FINDINGS: Contralateral Common Femoral Vein: Respiratory phasicity is normal
and symmetric with the symptomatic side. No evidence of thrombus.
Normal compressibility.

Common Femoral Vein: No evidence of thrombus. Normal
compressibility, respiratory phasicity and response to augmentation.

Saphenofemoral Junction: No evidence of thrombus. Normal
compressibility and flow on color Doppler imaging.

Profunda Femoral Vein: No evidence of thrombus. Normal
compressibility and flow on color Doppler imaging.

Femoral Vein: No evidence of thrombus. Normal compressibility,
respiratory phasicity and response to augmentation.

Popliteal Vein: No evidence of thrombus. Normal compressibility,
respiratory phasicity and response to augmentation.

Calf Veins: No evidence of thrombus. Normal compressibility and flow
on color Doppler imaging.

Superficial Great Saphenous Vein: Thrombus is noted within the calf
in the greater saphenous vein consistent with superficial
thrombophlebitis. This is at the area of clinical swelling and pain.

Venous Reflux:  None.

Other Findings:  None.
IMPRESSION: No evidence of deep venous thrombosis.

Superficial thrombophlebitis is noted within the saphenous vein in
the calf.

These results will be called to the ordering clinician or
representative by the Radiologist Assistant, and communication
documented in the PACS or [REDACTED].

## 2020-09-12 ENCOUNTER — Other Ambulatory Visit (INDEPENDENT_AMBULATORY_CARE_PROVIDER_SITE_OTHER): Payer: Self-pay | Admitting: Nurse Practitioner

## 2020-09-12 DIAGNOSIS — I8310 Varicose veins of unspecified lower extremity with inflammation: Secondary | ICD-10-CM

## 2020-09-13 ENCOUNTER — Ambulatory Visit (INDEPENDENT_AMBULATORY_CARE_PROVIDER_SITE_OTHER): Payer: BC Managed Care – PPO

## 2020-09-13 ENCOUNTER — Encounter (INDEPENDENT_AMBULATORY_CARE_PROVIDER_SITE_OTHER): Payer: Self-pay | Admitting: Nurse Practitioner

## 2020-09-13 ENCOUNTER — Other Ambulatory Visit: Payer: Self-pay

## 2020-09-13 ENCOUNTER — Ambulatory Visit (INDEPENDENT_AMBULATORY_CARE_PROVIDER_SITE_OTHER): Payer: BC Managed Care – PPO | Admitting: Nurse Practitioner

## 2020-09-13 VITALS — BP 139/78 | HR 52 | Ht 78.0 in | Wt 223.0 lb

## 2020-09-13 DIAGNOSIS — E785 Hyperlipidemia, unspecified: Secondary | ICD-10-CM

## 2020-09-13 DIAGNOSIS — I8310 Varicose veins of unspecified lower extremity with inflammation: Secondary | ICD-10-CM | POA: Diagnosis not present

## 2020-09-13 DIAGNOSIS — I8003 Phlebitis and thrombophlebitis of superficial vessels of lower extremities, bilateral: Secondary | ICD-10-CM

## 2020-09-13 MED ORDER — RIVAROXABAN (XARELTO) VTE STARTER PACK (15 & 20 MG): ORAL_TABLET | ORAL | 0 refills | Status: DC

## 2020-09-13 MED ORDER — APIXABAN 5 MG PO TABS: 5.0000 mg | ORAL_TABLET | Freq: Two times a day (BID) | ORAL | 2 refills | Status: DC

## 2020-09-16 ENCOUNTER — Encounter (INDEPENDENT_AMBULATORY_CARE_PROVIDER_SITE_OTHER): Payer: Self-pay | Admitting: Nurse Practitioner

## 2020-09-16 NOTE — Progress Notes (Signed)
Subjective:    Patient ID: KA FLAMMER, male    DOB: 1956/03/22, 65 y.o.   MRN: [id number] Chief Complaint  Patient presents with  . New Patient (Initial Visit)    VV with inflammation. With U/S    Jared [name] is a 65 year old male that presents today for evaluation of painful varicose veins.  The patient noticed several weeks ago he had a red, tender area on his left calf.  He originally had a DVT study on 05/03/2021 which showed evidence of thrombophlebitis in his left calf area.  Since that time the tenderness and redness approached his thigh area.  He denies any chest pain or shortness of breath.  The patient denies any periods of prolonged immobility, injury or known hypercoagulable factors.  Prior to this the patient routinely worn medical grade 1 compression socks utilize elevation.  Today noninvasive studies show evidence of reflux in the right great saphenous vein at the saphenofemoral junction and proximal thigh.  No evidence of deep venous insufficiency seen bilaterally.  No evidence of superficial venous reflux seen in the left lower extremity.  There is evidence of acute superficial thrombophlebitis in the right lower extremity from the mid thigh to proximal calf.  There is also evidence of acute superficial thrombophlebitis in the left lower extremity from the proximal thigh extending to the knee   Review of Systems  Cardiovascular: Positive for leg swelling.  Hematological: Bruises/bleeds easily.  All other systems reviewed and are negative.      Objective:   Physical Exam Vitals reviewed.  HENT:     Head: Normocephalic.  Cardiovascular:     Rate and Rhythm: Normal rate.  Pulmonary:     Effort: Pulmonary effort is normal.  Musculoskeletal:     Right lower leg: Edema present.     Left lower leg: Edema present.  Skin:    Findings: Erythema present.  Neurological:     Mental Status: He is alert and oriented to person, place, and time.  Psychiatric:         Mood and Affect: Mood normal.        Behavior: Behavior normal.        Thought Content: Thought content normal.        Judgment: Judgment normal.     BP 139/78   Pulse (!) 52   Ht 6' 6"  (1.981 m)   Wt 223 lb (101.2 kg)   BMI 25.77 kg/m   Past Medical History:  Diagnosis Date  . Dysphagia   . Elevated lipids   . History of kidney stones   . Neuroendocrine tumor   . Tobacco abuse     Social History   Socioeconomic History  . Marital status: Married    Spouse name: Not on file  . Number of children: Not on file  . Years of education: Not on file  . Highest education level: Not on file  Occupational History  . Not on file  Tobacco Use  . Smoking status: Current Every Day Smoker    Packs/day: 1.00  . Smokeless tobacco: Never Used  Substance and Sexual Activity  . Alcohol use: No  . Drug use: No  . Sexual activity: Yes    Birth control/protection: None  Other Topics Concern  . Not on file  Social History Narrative  . Not on file   Social Determinants of Health   Financial Resource Strain: Not on file  Food Insecurity: Not on file  Transportation Needs: Not on file  Physical Activity: Not on file  Stress: Not on file  Social Connections: Not on file  Intimate Partner Violence: Not on file    Past Surgical History:  Procedure Laterality Date  . CHOLECYSTECTOMY    . COLONOSCOPY WITH PROPOFOL N/A 03/27/2017   Procedure: COLONOSCOPY WITH PROPOFOL;  Surgeon: Manya Silvas, MD;  Location: [facility] ENDOSCOPY;  Service: Endoscopy;  Laterality: N/A;  . ESOPHAGOGASTRODUODENOSCOPY (EGD) WITH PROPOFOL N/A 03/27/2017   Procedure: ESOPHAGOGASTRODUODENOSCOPY (EGD) WITH PROPOFOL;  Surgeon: Manya Silvas, MD;  Location: [facility] ENDOSCOPY;  Service: Endoscopy;  Laterality: N/A;  . hip fracture with rod placement Right     History reviewed. No pertinent family history.  No Known Allergies  CBC Latest Ref Rng & Units 08/27/2008 08/23/2008 08/22/2008  WBC 4.0 - 10.5 K/uL 7.5  10.8(H) 9.8  Hemoglobin 13.0 - 17.0 g/dL 8.7(L) 9.6(L) 9.6 DELTA CHECK NOTED(L)  Hematocrit 39.0 - 52.0 % 25.7(L) 29.4(L) 29.1(L)  Platelets 150 - 400 K/uL 254 215 242 DELTA CHECK NOTED      CMP     Component Value Date/Time   NA 138 08/22/2008 0525   K 4.2 08/22/2008 0525   CL 105 08/22/2008 0525   CO2 28 08/22/2008 0525   GLUCOSE 114 (H) 08/22/2008 0525   BUN 9 08/22/2008 0525   CREATININE 0.83 08/22/2008 0525   CALCIUM 8.5 08/22/2008 0525   GFRNONAA >60 08/22/2008 0525   GFRAA  08/22/2008 0525    >60        The eGFR has been calculated using the MDRD equation. This calculation has not been validated in all clinical situations. eGFR's persistently <60 mL/min signify possible Chronic Kidney Disease.     No results found.     Assessment & Plan:   1. Thrombophlebitis of both saphenous veins Patient was found to have bilateral superficial thrombophlebitis in the great saphenous veins.  The patient previously had an ultrasound on 09/03/2020 which just showed thrombophlebitis in the left calf.  Based on this it is likely there has been propagation.  Due to propagation and extensiveness of the thrombophlebitis in both legs, we will plan on anticoagulating the patient for 3 months.  He is also instructed to continue with conservative measures elevation and use of medical grade 1 compression stockings.  The patient can also utilize cool/warm compresses as well. - RIVAROXABAN (XARELTO) VTE STARTER PACK (15 & 20 MG); Follow package directions: Take one 61m tablet by mouth twice a day. On day 22, switch to one 246mtablet once a day. Take with food.  Dispense: 51 each; Refill: 0  2. Varicose veins of lower extremity with inflammation, unspecified laterality We will have patient return in 3 months for noninvasive studies in order to re evaluate for venous reflux bilaterally for possible treatment of large varicosities.  3. Hyperlipidemia, unspecified hyperlipidemia type Continue  statin as ordered and reviewed, no changes at this time    Current Outpatient Medications on File Prior to Visit  Medication Sig Dispense Refill  . clonazePAM (KLONOPIN) 1 MG tablet Take 0.5 mg by mouth at bedtime.     . Marland Kitchenbuprofen (ADVIL,MOTRIN) 200 MG tablet Take 200 mg by mouth every 6 (six) hours as needed.    . Meth-Hyo-M Bl-Na Phos-Ph Sal (URIBEL) 118 MG CAPS Take 1 capsule (118 mg total) by mouth 4 (four) times daily as needed. 180 capsule 2  . Multiple Vitamins-Minerals (CENTRUM SILVER 50+MEN PO) Take by mouth.    . Marland Kitchenmeprazole (PRILOSEC) 40 MG capsule Take by mouth.  Current Facility-Administered Medications on File Prior to Visit  Medication Dose Route Frequency Provider Last Rate Last Admin  . lidocaine (XYLOCAINE) 2 % jelly 1 application  1 application Urethral Once Billey Co, MD        There are no Patient Instructions on file for this visit. No follow-ups on file.   Kris Hartmann, NP

## 2020-09-19 DIAGNOSIS — I8393 Asymptomatic varicose veins of bilateral lower extremities: Secondary | ICD-10-CM | POA: Insufficient documentation

## 2020-09-19 DIAGNOSIS — G2581 Restless legs syndrome: Secondary | ICD-10-CM | POA: Insufficient documentation

## 2020-09-26 ENCOUNTER — Telehealth (INDEPENDENT_AMBULATORY_CARE_PROVIDER_SITE_OTHER): Payer: Self-pay

## 2020-09-26 ENCOUNTER — Ambulatory Visit (INDEPENDENT_AMBULATORY_CARE_PROVIDER_SITE_OTHER): Payer: BC Managed Care – PPO | Admitting: Urology

## 2020-09-26 ENCOUNTER — Encounter: Payer: Self-pay | Admitting: Urology

## 2020-09-26 ENCOUNTER — Other Ambulatory Visit: Payer: Self-pay

## 2020-09-26 VITALS — BP 161/88 | HR 64 | Ht 78.0 in | Wt 223.0 lb

## 2020-09-26 DIAGNOSIS — R310 Gross hematuria: Secondary | ICD-10-CM

## 2020-09-26 DIAGNOSIS — R300 Dysuria: Secondary | ICD-10-CM

## 2020-09-26 DIAGNOSIS — N529 Male erectile dysfunction, unspecified: Secondary | ICD-10-CM | POA: Diagnosis not present

## 2020-09-26 MED ORDER — TADALAFIL 5 MG PO TABS: 5.0000 mg | ORAL_TABLET | Freq: Every day | ORAL | 6 refills | Status: DC | PRN

## 2020-09-26 NOTE — Progress Notes (Signed)
   09/26/2020 2:41 PM   [name] 04/16/1956 [id number]  Reason for visit: Gross hematuria, ED  HPI: I saw Mr. Granieri as an add-on in urology clinic today for gross hematuria.  He is a 65 year old male who I last saw in June 2020 an interstitial cystitis type picture.  He had a chronic history of dysuria, urgency, frequency, and pelvic pain at that time, work-up with cystoscopy was negative and showed a small prostate.  He was trialed on Uribel at that time which completely resolved his symptoms.  He also quit smoking.  He denies any significant urinary problems over the last 2 years.  He recently started Xarelto 10 days ago for superficial thrombophlebitis in the great saphenous veins, but there is no DVT.  Vascular started him on a total of 3 months planned anticoagulation.  He has noticed some dark pink to light red urine over the last 48 hours, but denies any clots or burning.  Urinalysis today greater than 30 RBCs, 0-5 WBCs, no bacteria, nitrate negative, no leukocytes.  He also has had trouble with erections over the last few months and is interested in trying medication for this.  He denies any nitrates for chest pain.  He is never taken PDE 5 inhibitors before.  We discussed common possible etiologies of hematuria including BPH, malignancy, urolithiasis, medical renal disease, and idiopathic. Standard workup recommended by the AUA includes imaging with CT urogram to assess the upper tracts, and cystoscopy. Cytology is performed on patient's with gross hematuria to look for malignant cells in the urine.  CT urogram and cystoscopy for evaluation of gross hematuria Trial of Cialis for ED  Billey Co, MD  [facility] [address], [place], [place] [address] [phone]

## 2020-09-26 NOTE — Telephone Encounter (Signed)
The patient should continue to hold the xarelto for now.  He should reach out to his urologist to see if they can get him in to workup possible source of bleeding.  If needed we can send a referral over

## 2020-09-26 NOTE — Patient Instructions (Signed)
Cystoscopy Cystoscopy is a procedure that is used to help diagnose and sometimes treat conditions that affect the lower urinary tract. The lower urinary tract includes the bladder and the urethra. The urethra is the tube that drains urine from the bladder. Cystoscopy is done using a thin, tube-shaped instrument with a light and camera at the end (cystoscope). The cystoscope may be hard or flexible, depending on the goal of the procedure. The cystoscope is inserted through the urethra, into the bladder. Cystoscopy may be recommended if you have:  Urinary tract infections that keep coming back.  Blood in the urine (hematuria).  An inability to control when you urinate (urinary incontinence) or an overactive bladder.  Unusual cells found in a urine sample.  A blockage in the urethra, such as a urinary stone.  Painful urination.  An abnormality in the bladder found during an intravenous pyelogram (IVP) or CT scan. Cystoscopy may also be done to remove a sample of tissue to be examined under a microscope (biopsy). What are the risks? Generally, this is a safe procedure. However, problems may occur, including:  Infection.  Bleeding.  What happens during the procedure?  1. You will be given one or more of the following: ? A medicine to numb the area (local anesthetic). 2. The area around the opening of your urethra will be cleaned. 3. The cystoscope will be passed through your urethra into your bladder. 4. Germ-free (sterile) fluid will flow through the cystoscope to fill your bladder. The fluid will stretch your bladder so that your health care provider can clearly examine your bladder walls. 5. Your doctor will look at the urethra and bladder. 6. The cystoscope will be removed The procedure may vary among health care providers  What can I expect after the procedure? After the procedure, it is common to have: 1. Some soreness or pain in your abdomen and urethra. 2. Urinary symptoms.  These include: ? Mild pain or burning when you urinate. Pain should stop within a few minutes after you urinate. This may last for up to 1 week. ? A small amount of blood in your urine for several days. ? Feeling like you need to urinate but producing only a small amount of urine. Follow these instructions at home: General instructions  Return to your normal activities as told by your health care provider.   Do not drive for 24 hours if you were given a sedative during your procedure.  Watch for any blood in your urine. If the amount of blood in your urine increases, call your health care provider.  If a tissue sample was removed for testing (biopsy) during your procedure, it is up to you to get your test results. Ask your health care provider, or the department that is doing the test, when your results will be ready.  Drink enough fluid to keep your urine pale yellow.  Keep all follow-up visits as told by your health care provider. This is important. Contact a health care provider if you:  Have pain that gets worse or does not get better with medicine, especially pain when you urinate.  Have trouble urinating.  Have more blood in your urine. Get help right away if you:  Have blood clots in your urine.  Have abdominal pain.  Have a fever or chills.  Are unable to urinate. Summary  Cystoscopy is a procedure that is used to help diagnose and sometimes treat conditions that affect the lower urinary tract.  Cystoscopy is done using   a thin, tube-shaped instrument with a light and camera at the end.  After the procedure, it is common to have some soreness or pain in your abdomen and urethra.  Watch for any blood in your urine. If the amount of blood in your urine increases, call your health care provider.  If you were prescribed an antibiotic medicine, take it as told by your health care provider. Do not stop taking the antibiotic even if you start to feel better. This  information is not intended to replace advice given to you by your health care provider. Make sure you discuss any questions you have with your health care provider. Document Revised: 06/29/2018 Document Reviewed: 06/29/2018 Elsevier Patient Education  2020 [employer]vier Inc.   

## 2020-09-26 NOTE — Telephone Encounter (Signed)
Patient was made aware with medical recommendations and will get contact with his urologist to make an appointment

## 2020-09-28 ENCOUNTER — Encounter: Payer: Self-pay | Admitting: *Deleted

## 2020-09-28 LAB — URINALYSIS, COMPLETE
Bilirubin, UA: NEGATIVE
Glucose, UA: NEGATIVE
Ketones, UA: NEGATIVE
Leukocytes,UA: NEGATIVE
Nitrite, UA: NEGATIVE
Specific Gravity, UA: 1.025 (ref 1.005–1.030)
Urobilinogen, Ur: 1 mg/dL (ref 0.2–1.0)
pH, UA: 5.5 (ref 5.0–7.5)

## 2020-09-28 LAB — MICROSCOPIC EXAMINATION
Bacteria, UA: NONE SEEN
RBC, Urine: >30 /hpf — AB (ref 0–2)

## 2020-10-04 ENCOUNTER — Telehealth: Payer: Self-pay | Admitting: *Deleted

## 2020-10-04 DIAGNOSIS — Z122 Encounter for screening for malignant neoplasm of respiratory organs: Secondary | ICD-10-CM

## 2020-10-04 DIAGNOSIS — Z87891 Personal history of nicotine dependence: Secondary | ICD-10-CM

## 2020-10-04 NOTE — Telephone Encounter (Signed)
Received referral for initial lung cancer screening scan. Contacted patient and obtained smoking history,(former, quit 01/30/19, 45 pack year history) as well as answering questions related to screening process. Patient denies signs of lung cancer such as weight loss or hemoptysis. Patient denies comorbidity that would prevent curative treatment if lung cancer were found. Patient is scheduled for shared decision making visit and CT scan on 10/23/20 at 145pm.

## 2020-10-05 ENCOUNTER — Ambulatory Visit
Admission: RE | Admit: 2020-10-05 | Discharge: 2020-10-05 | Disposition: A | Discharge status: Home / Self Care | Payer: BC Managed Care – PPO | Source: Ambulatory Visit | Attending: Urology | Admitting: Urology

## 2020-10-05 ENCOUNTER — Other Ambulatory Visit: Payer: Self-pay

## 2020-10-05 DIAGNOSIS — R310 Gross hematuria: Secondary | ICD-10-CM | POA: Insufficient documentation

## 2020-10-05 HISTORY — DX: Malignant (primary) neoplasm, unspecified: C80.1

## 2020-10-05 LAB — POCT I-STAT CREATININE: Creatinine, Ser: 0.9 mg/dL (ref 0.61–1.24)

## 2020-10-05 IMAGING — CT CT ABD-PEL WO/W CM
3 of 12 series · 10 of 46 positions shown, 15 images · IV contrast (APPLIED)
Comparison: [DATE].

CLINICAL DATA: Episode of gross hematuria after starting blood
thinning medication.

EXAM:
CT ABDOMEN AND PELVIS WITHOUT AND WITH CONTRAST
TECHNIQUE: Multidetector CT imaging of the abdomen and pelvis was performed
following the standard protocol before and following the bolus
administration of intravenous contrast.
CONTRAST:  100 cc Omnipaque 300

[Series 2: axial pre · axial · non-contrast · 0.87mm/px · z∈[-575,-155]mm · 7 of 114 slices shown, 12 images]
[im 15/114  soft-tissue]
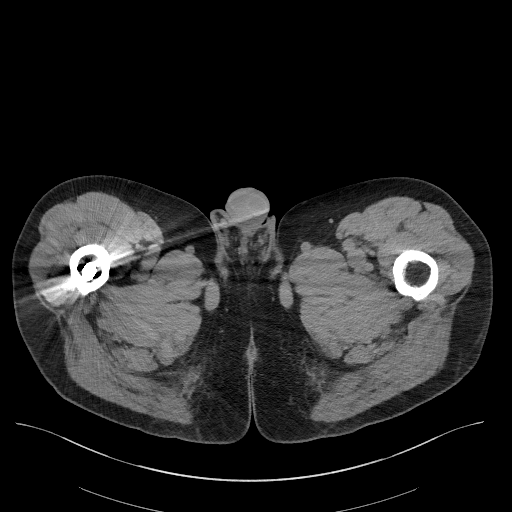
[im 15/114  bone]
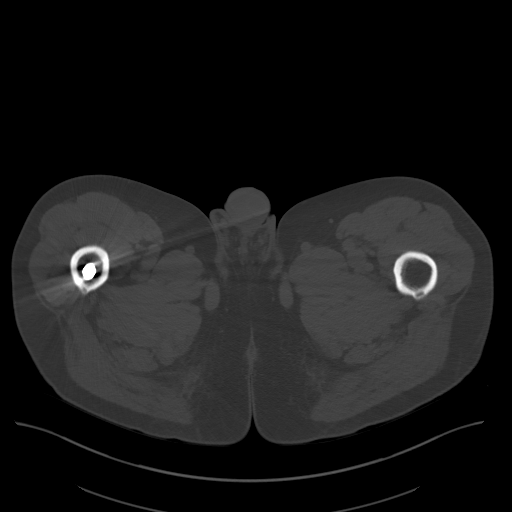
[im 29/114  soft-tissue]
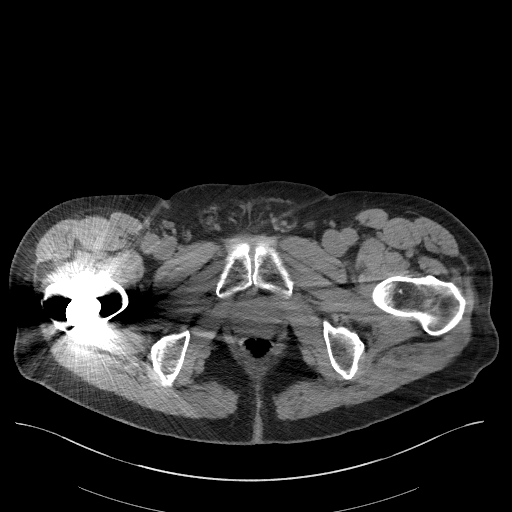
[im 43/114  soft-tissue]
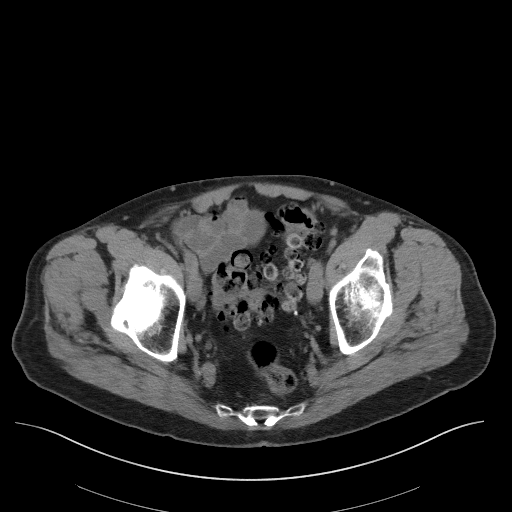
[im 57/114  soft-tissue]
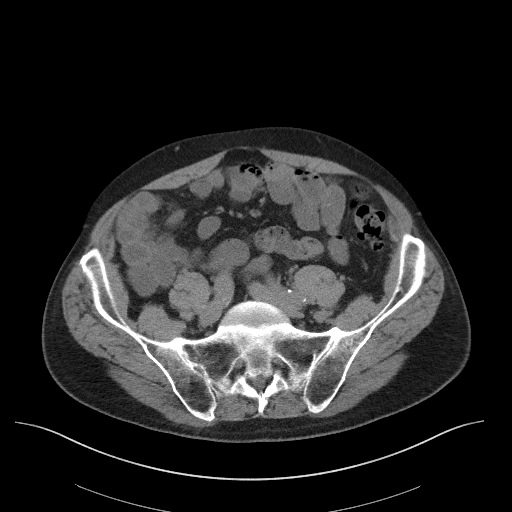
[im 57/114  lung]
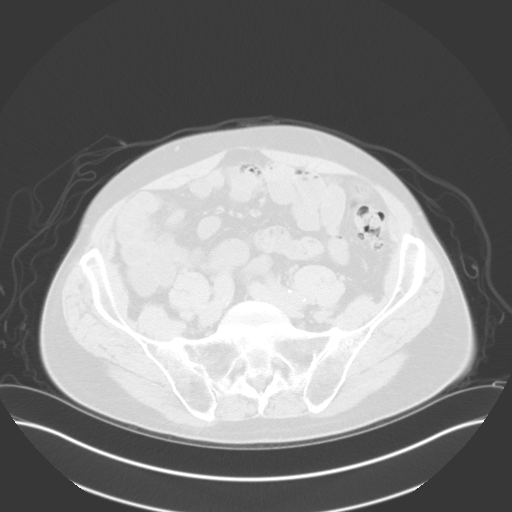
[im 71/114  soft-tissue]
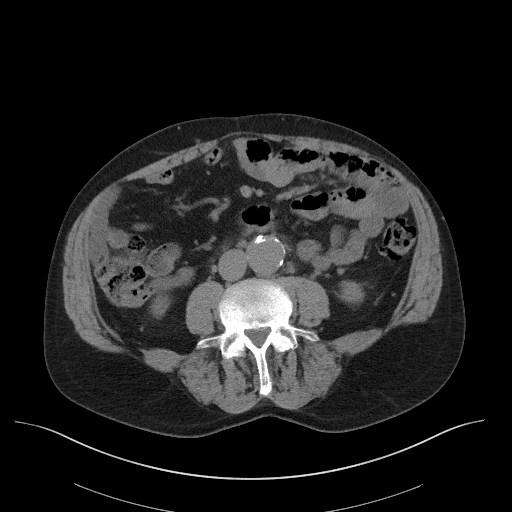
[im 71/114  lung]
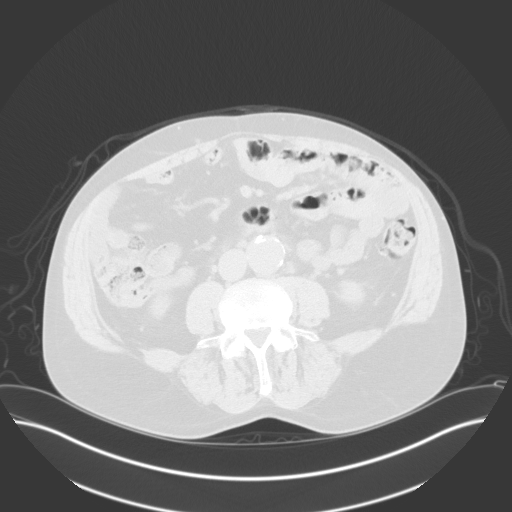
[im 85/114  soft-tissue]
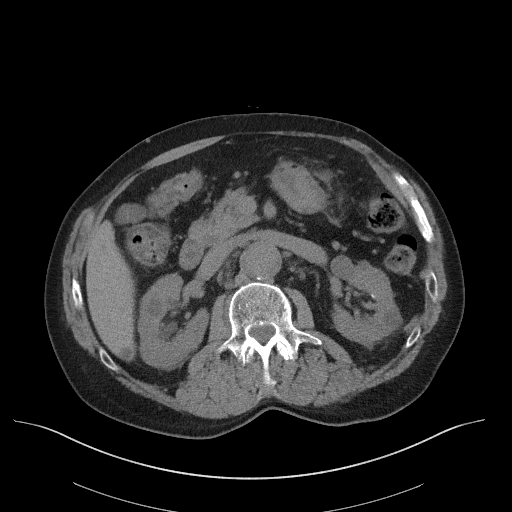
[im 85/114  lung]
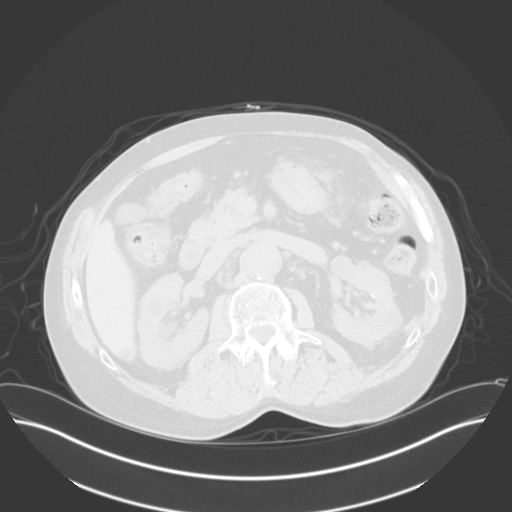
[im 99/114  soft-tissue]
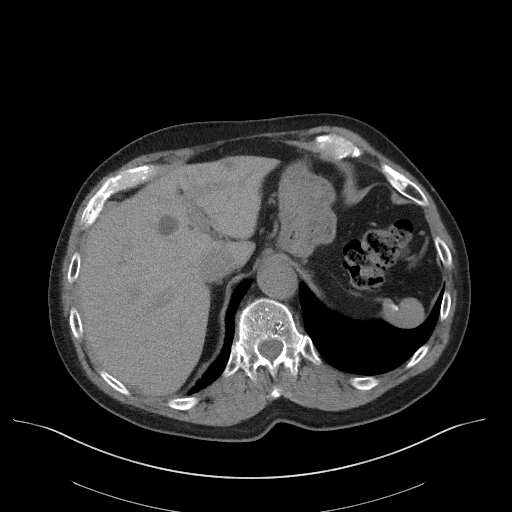
[im 99/114  lung]
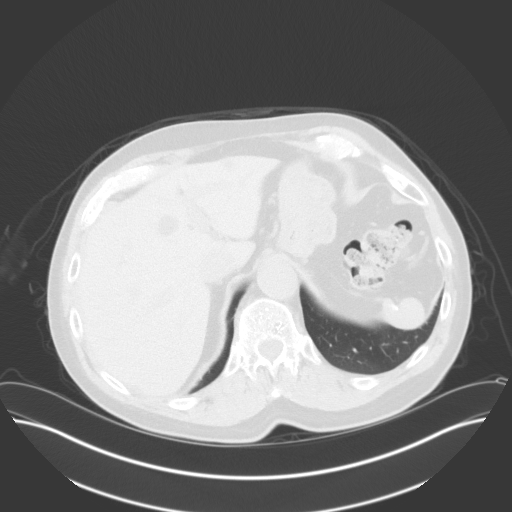

[Series 8: sagittal pre · sagittal · non-contrast · 0.57mm/px · 1 of 138 slices shown]
[im 39/138  soft-tissue]
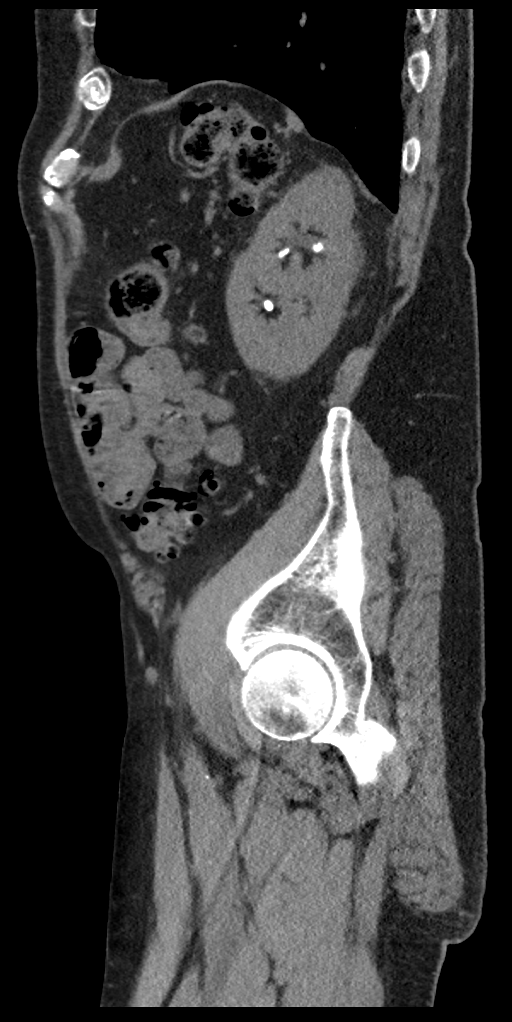

[Series 16: coronal delay · coronal · delayed · 0.87mm/px · 2 of 102 slices shown]
[im 34/102  soft-tissue]
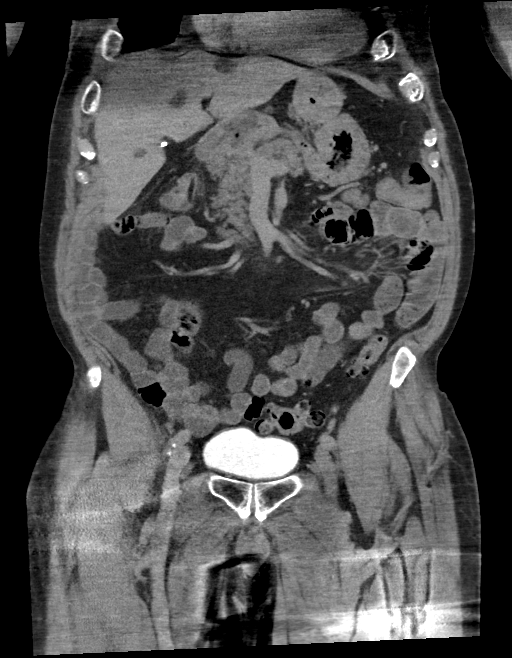
[im 68/102  soft-tissue]
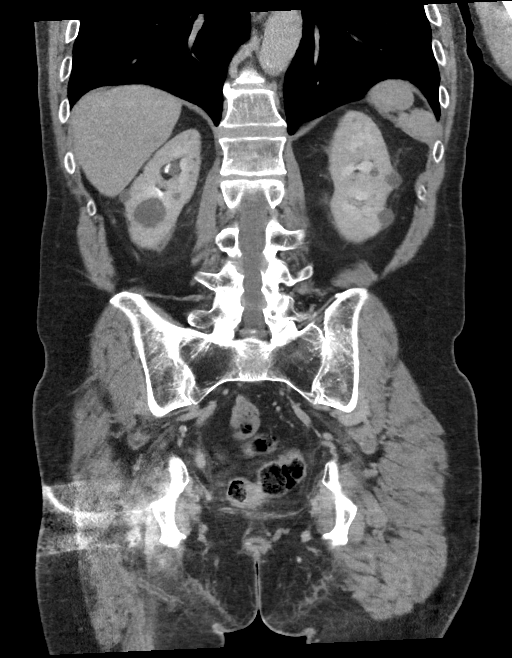

[10 of 46 positions shown; findings below may reference images not displayed]

FINDINGS: Lower chest: The lung bases are clear of acute process. No pleural
effusion or pulmonary lesions. The heart is normal in size. No
pericardial effusion. The distal esophagus and aorta are
unremarkable.

Hepatobiliary: Numerous benign-appearing hepatic cysts. No worrisome
hepatic lesions or intrahepatic biliary dilatation. The gallbladder
is surgically absent. Mild associated common bile duct dilatation.

Pancreas: No mass, inflammation or ductal dilatation.

Spleen: Very irregular splenic contour. I suspect prior trauma or
surgery. This is unchanged since [F8].

Adrenals/Urinary Tract: Adrenal glands are normal.

There are numerous bilateral renal calculi but no obstructing
ureteral calculi or bladder calculi. Both kidneys demonstrate normal
enhancement/perfusion. There are numerous bilateral renal cysts. No
worrisome enhancing renal lesions are identified.

On the delayed images I do not see any significant collecting system
abnormalities other than the renal calculi. Both ureters are normal.

The bladder is unremarkable. No bladder mass or asymmetric bladder
wall thickening.

Stomach/Bowel: The stomach, duodenum, small bowel and colon are
unremarkable. No acute inflammatory changes, mass lesions or
obstructive findings. The terminal ileum and appendix are normal.
Advanced descending and sigmoid colon diverticulosis but no findings
for acute diverticulitis.

Vascular/Lymphatic: Age advanced atherosclerotic calcifications
involving the aorta and iliac arteries. There is a 3.5 cm infrarenal
abdominal aortic aneurysm. This is new since the prior CT scan.
Recommend follow-up ultrasound every 2 years. This recommendation
follows ACR consensus guidelines: White Paper of the ACR Incidental
Findings Committee II on Vascular Findings. [HOSPITAL] [F8];
[DATE].

Reproductive: The prostate gland and seminal vesicles are
unremarkable.

Other: No pelvic mass or adenopathy. No free pelvic fluid
collections. No inguinal mass or adenopathy. No abdominal wall
hernia or subcutaneous lesions.

Musculoskeletal: No significant bony findings.
IMPRESSION: 1. Numerous bilateral renal calculi but no obstructing ureteral
calculi or bladder calculi.
2. Numerous bilateral renal cysts but no worrisome enhancing renal
lesions.
3. Age advanced atherosclerotic calcifications involving the aorta
and iliac arteries with a 3.5 cm infrarenal abdominal aortic
aneurysm. This is new since the prior CT scan. Recommend follow-up
ultrasound every 2 years.
4. Numerous benign-appearing hepatic cysts.
5. Status post cholecystectomy with mild associated common bile duct
dilatation.
6. Advanced descending and sigmoid colon diverticulosis without
findings for acute diverticulitis.

Aortic Atherosclerosis ([F8]-[F8]).

## 2020-10-05 MED ORDER — IOHEXOL 300 MG/ML  SOLN: 125.0000 mL | Freq: Once | INTRAMUSCULAR | Status: DC | PRN

## 2020-10-09 MED ORDER — IOHEXOL 300 MG/ML  SOLN
100.0000 mL | Freq: Once | INTRAMUSCULAR | Status: AC | PRN
  Administered 2020-10-05: 100 mL via INTRAVENOUS

## 2020-10-10 ENCOUNTER — Encounter: Payer: Self-pay | Admitting: Urology

## 2020-10-10 ENCOUNTER — Other Ambulatory Visit: Payer: Self-pay

## 2020-10-10 ENCOUNTER — Ambulatory Visit (INDEPENDENT_AMBULATORY_CARE_PROVIDER_SITE_OTHER): Payer: BC Managed Care – PPO | Admitting: Urology

## 2020-10-10 VITALS — BP 143/77 | HR 56 | Ht 78.0 in | Wt 224.0 lb

## 2020-10-10 DIAGNOSIS — R310 Gross hematuria: Secondary | ICD-10-CM | POA: Diagnosis not present

## 2020-10-10 DIAGNOSIS — R350 Frequency of micturition: Secondary | ICD-10-CM

## 2020-10-10 DIAGNOSIS — N3010 Interstitial cystitis (chronic) without hematuria: Secondary | ICD-10-CM

## 2020-10-10 MED ORDER — URIBEL 118 MG PO CAPS: 118.0000 mg | ORAL_CAPSULE | Freq: Four times a day (QID) | ORAL | 6 refills | Status: DC | PRN

## 2020-10-10 NOTE — Progress Notes (Signed)
Cystoscopy Procedure Note:  Indication: Gross hematuria after starting Xarelto  After informed consent and discussion of the procedure and its risks, Jared [name] was positioned and prepped in the standard fashion. Cystoscopy was performed with a flexible cystoscope. The urethra, bladder neck and entire bladder was visualized in a standard fashion. The prostate was small to moderate in size, no median lobe or other prostatic abnormalities. The ureteral orifices were visualized in their normal location and orientation.  Bladder mucosa grossly normal throughout.  Retroflexion with some friable blood vessels at the bladder neck and prostate, but no suspicious lesions.  Voided cytology sent.  Imaging: CT urogram with numerous nonobstructive stones bilaterally, no hydronephrosis, or filling defects.  New 3.5 cm AAA, referral placed to vascular  Findings: Normal cystoscopy, suspect friable blood vessels at prostate and bladder neck as etiology of gross hematuria  Assessment and Plan: Will call with cytology results RTC 1 year  [name], MD 10/10/2020

## 2020-10-12 LAB — CYTOLOGY - NON PAP

## 2020-10-15 ENCOUNTER — Telehealth: Payer: Self-pay

## 2020-10-15 NOTE — Telephone Encounter (Signed)
-----   Message from Billey Co, MD sent at 10/12/2020  4:15 PM EDT ----- Good news, cytology negative for any suspicious cells, keep follow-up as scheduled  [name], MD 10/12/2020

## 2020-10-23 ENCOUNTER — Inpatient Hospital Stay: Payer: BC Managed Care – PPO | Attending: Nurse Practitioner | Admitting: Oncology

## 2020-10-23 ENCOUNTER — Ambulatory Visit
Admission: RE | Admit: 2020-10-23 | Discharge: 2020-10-23 | Disposition: A | Discharge status: Home / Self Care | Payer: BC Managed Care – PPO | Source: Ambulatory Visit | Attending: Nurse Practitioner | Admitting: Nurse Practitioner

## 2020-10-23 ENCOUNTER — Other Ambulatory Visit: Payer: Self-pay

## 2020-10-23 DIAGNOSIS — Z87891 Personal history of nicotine dependence: Secondary | ICD-10-CM | POA: Diagnosis not present

## 2020-10-23 DIAGNOSIS — Z122 Encounter for screening for malignant neoplasm of respiratory organs: Secondary | ICD-10-CM | POA: Diagnosis present

## 2020-10-23 IMAGING — CT CT CHEST LUNG CANCER SCREENING LOW DOSE W/O CM
2 of 5 series · 14 of 40 positions shown, 17 images · non-contrast
Comparison: No priors.

CLINICAL DATA: 65-year-old male former smoker (quit in [DATE])
with 45 pack-year history of smoking. Lung cancer screening
examination.

EXAM:
CT CHEST WITHOUT CONTRAST LOW-DOSE FOR LUNG CANCER SCREENING
TECHNIQUE: Multidetector CT imaging of the chest was performed following the
standard protocol without IV contrast.

[Series 3: lung 1.00 · axial · 0.74mm/px · z∈[-1277,-907]mm · 11 of 408 slices shown, 14 images]
[im 19/408  mediastinal]
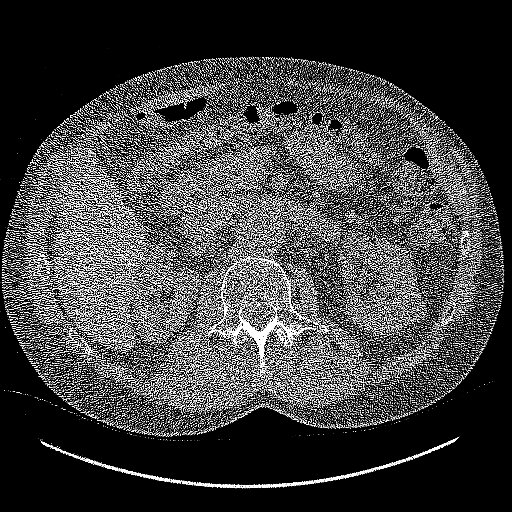
[im 19/408  lung]
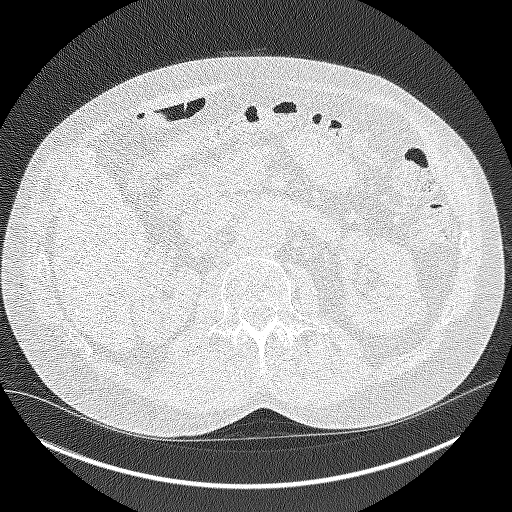
[im 56/408  lung]
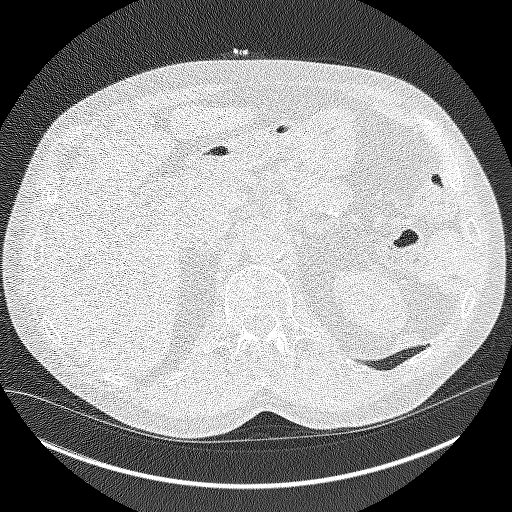
[im 93/408  lung]
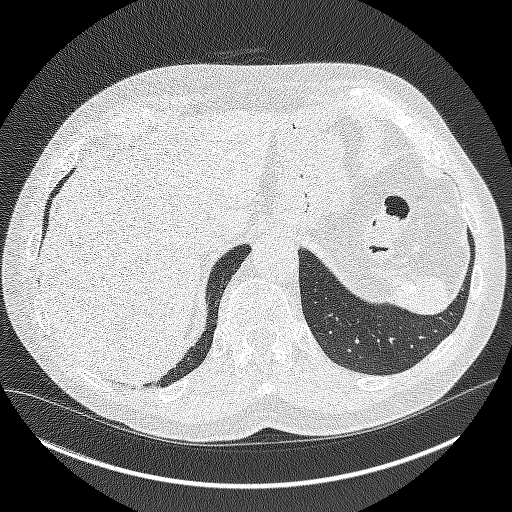
[im 130/408  lung]
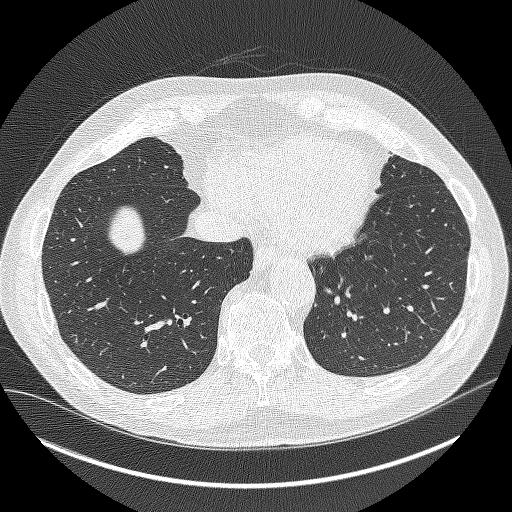
[im 167/408  mediastinal]
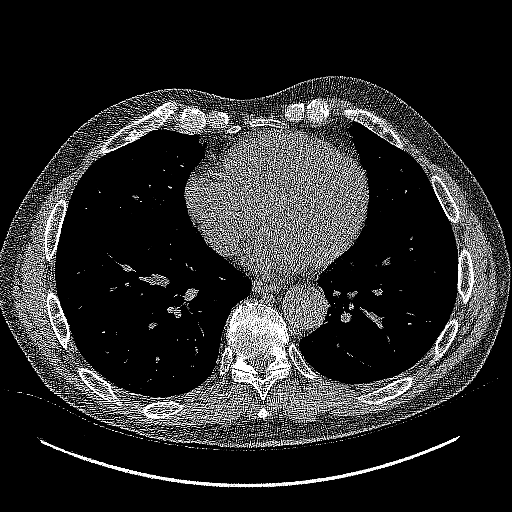
[im 167/408  lung]
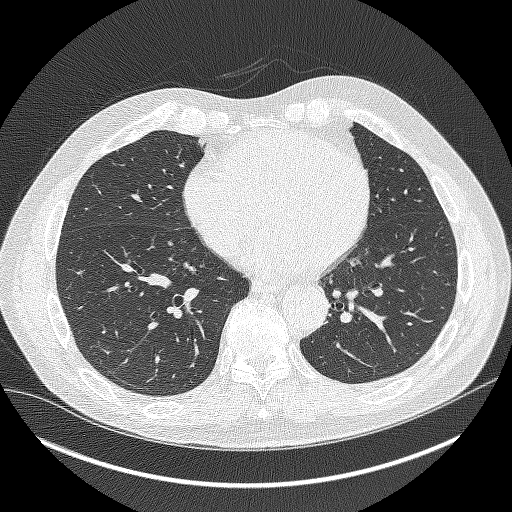
[im 204/408  lung]
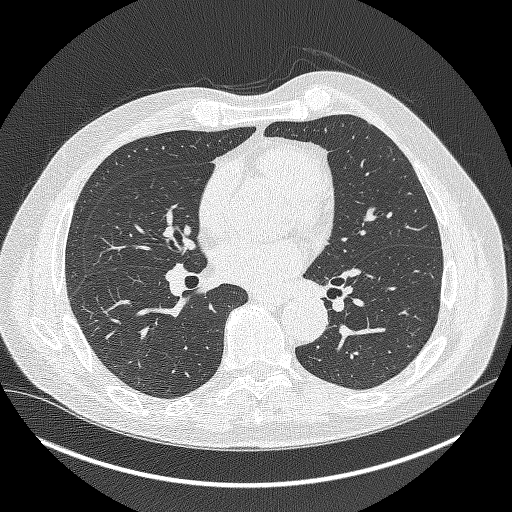
[im 241/408  lung]
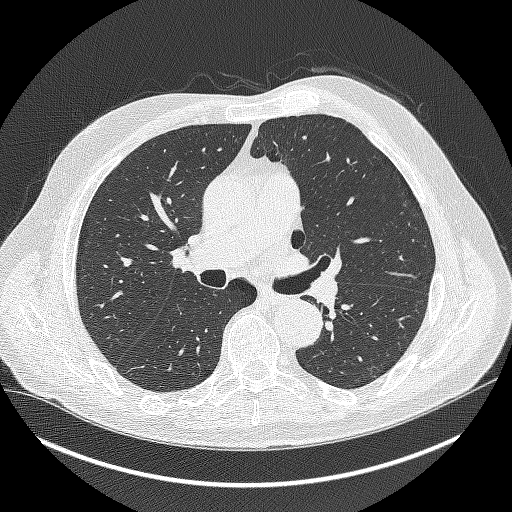
[im 278/408  lung]
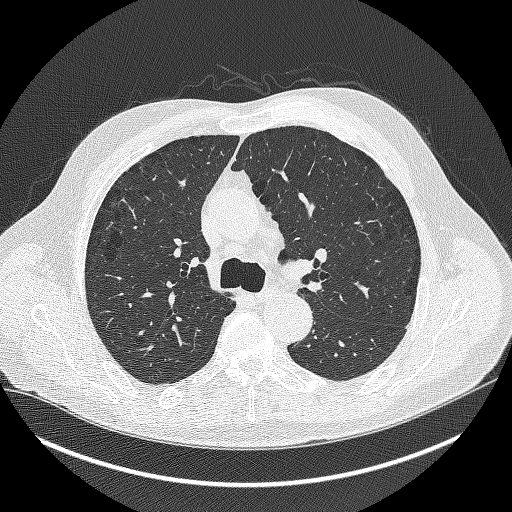
[im 315/408  mediastinal]
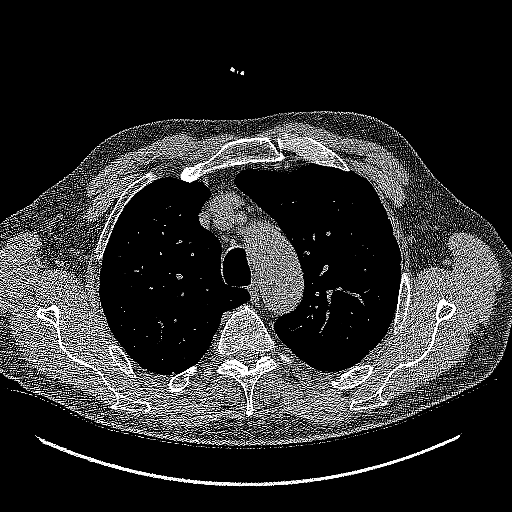
[im 315/408  lung]
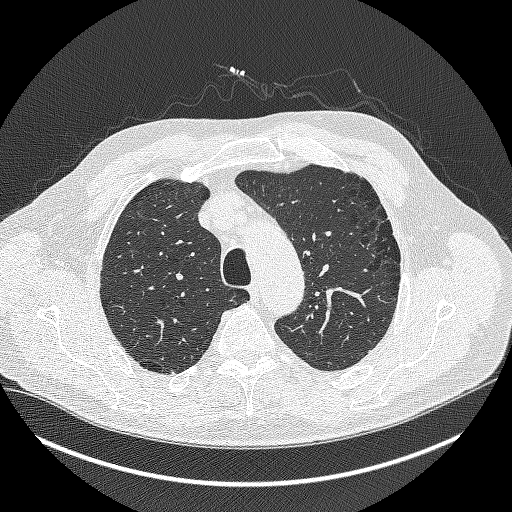
[im 352/408  lung]
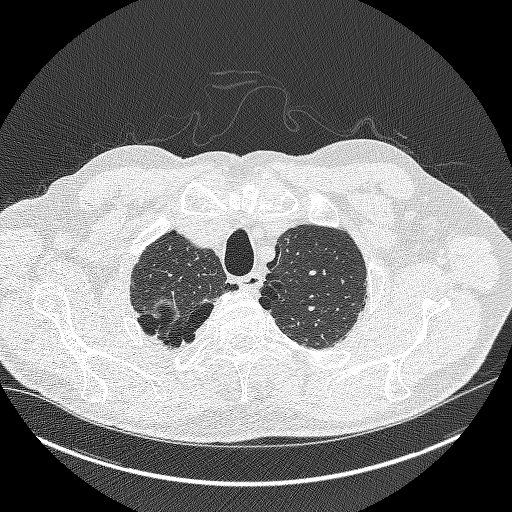
[im 389/408  lung]
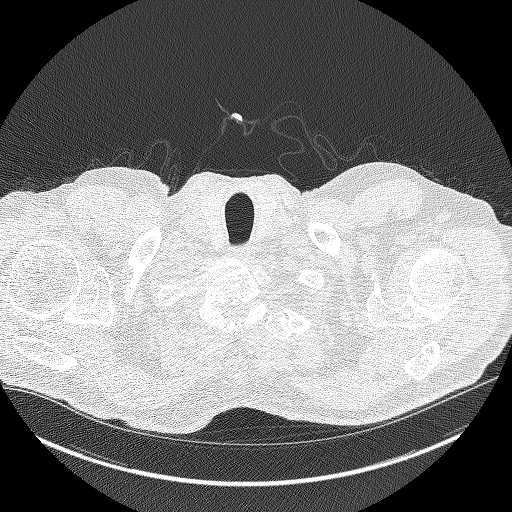

[Series 5: coronals lung 1.00 cor · coronal · 0.74mm/px · 3 of 299 slices shown]
[im 60/299  lung]
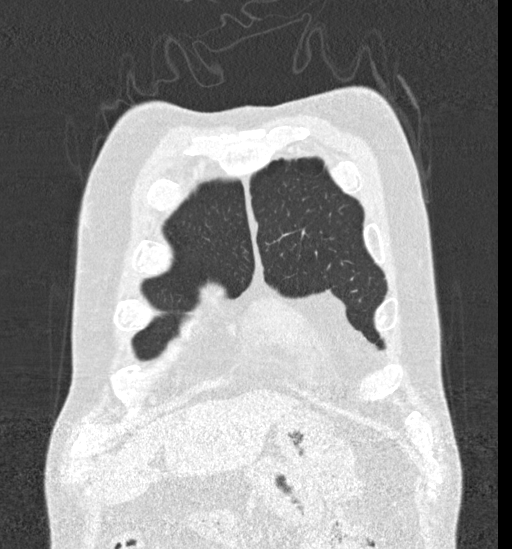
[im 120/299  lung]
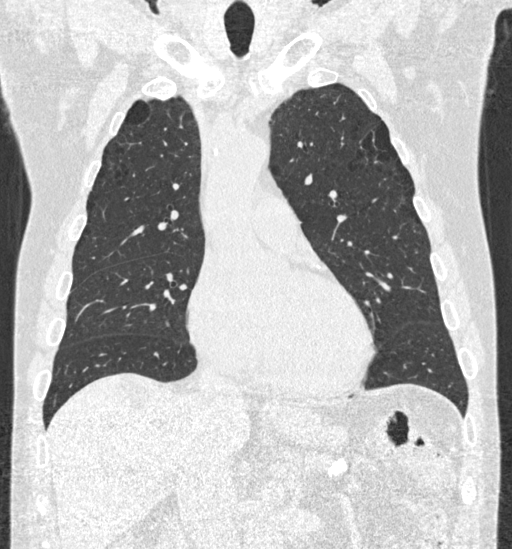
[im 179/299  lung]
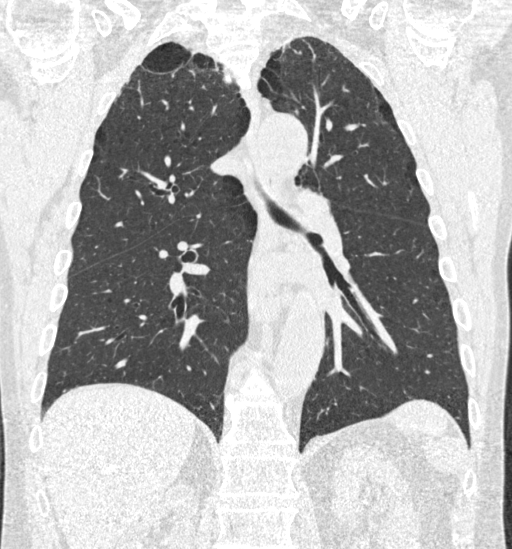

[14 of 40 positions shown; findings below may reference images not displayed]

FINDINGS: Cardiovascular: Heart size is normal. There is no significant
pericardial fluid, thickening or pericardial calcification. There is
aortic atherosclerosis, as well as atherosclerosis of the great
vessels of the mediastinum and the coronary arteries, including
calcified atherosclerotic plaque in the left anterior descending and
right coronary arteries.

Mediastinum/Nodes: No pathologically enlarged mediastinal or hilar
lymph nodes. Please note that accurate exclusion of hilar adenopathy
is limited on noncontrast CT scans. Esophagus is unremarkable in
appearance. No axillary lymphadenopathy.

Lungs/Pleura: Multiple small pulmonary nodules are noted throughout
the lungs bilaterally, largest of which is in the posterior aspect
of the left upper lobe (axial image 54 of series 3), with a volume
derived mean diameter of 4.4 mm. No other larger more suspicious
appearing pulmonary nodules or masses are noted. No acute
consolidative airspace disease. No pleural effusions. Diffuse
bronchial wall thickening with mild centrilobular and moderate
paraseptal emphysema.

Upper Abdomen: Aortic atherosclerosis. Numerous nonobstructive
calculi are noted within the collecting systems of both kidneys,
largest of which measures 6 mm in the interpolar region of the left
kidney. Multiple low-attenuation lesions in the liver and left
kidney, incompletely characterized on today's non-contrast CT
examination, but similar to prior studies, previously characterized
as cysts. Status post cholecystectomy.

Musculoskeletal: There are no aggressive appearing lytic or blastic
lesions noted in the visualized portions of the skeleton.
IMPRESSION: 1. Lung-RADS 2S, benign appearance or behavior. Continue annual
screening with low-dose chest CT without contrast in 12 months.
2. The "S" modifier above refers to potentially clinically
significant non lung cancer related findings. Specifically, there is
aortic atherosclerosis, in addition to 2 vessel coronary artery
disease. Please note that although the presence of coronary artery
calcium documents the presence of coronary artery disease, the
severity of this disease and any potential stenosis cannot be
assessed on this non-gated CT examination. Assessment for potential
risk factor modification, dietary therapy or pharmacologic therapy
may be warranted, if clinically indicated.
3. Mild diffuse bronchial wall thickening with mild centrilobular
and moderate paraseptal emphysema; imaging findings suggestive of
underlying COPD.

Aortic Atherosclerosis ([14]-[14]) and Emphysema ([14]-[14]).

## 2020-10-23 NOTE — Progress Notes (Signed)
Virtual Visit via Video Note  I connected with Mr. Jared [name] on 10/23/20 at  1:45 PM EDT by a video enabled telemedicine application and verified that I am speaking with the correct person using two identifiers.  Location: Patient: [facility] Provider: Clinic    I discussed the limitations of evaluation and management by telemedicine and the availability of in person appointments. The patient expressed understanding and agreed to proceed.  I discussed the assessment and treatment plan with the patient. The patient was provided an opportunity to ask questions and all were answered. The patient agreed with the plan and demonstrated an understanding of the instructions.   The patient was advised to call back or seek an in-person evaluation if the symptoms worsen or if the condition fails to improve as anticipated.   In accordance with CMS guidelines, patient has met eligibility criteria including age, absence of signs or symptoms of lung cancer.  Social History   Tobacco Use  . Smoking status: Former Smoker    Packs/day: 1.00    Years: 45.00    Pack years: 45.00    Types: Cigarettes    Quit date: 01/30/2019    Years since quitting: 1.7  . Smokeless tobacco: Never Used  Substance Use Topics  . Alcohol use: No  . Drug use: No      A shared decision-making session was conducted prior to the performance of CT scan. This includes one or more decision aids, includes benefits and harms of screening, follow-up diagnostic testing, over-diagnosis, false positive rate, and total radiation exposure.   Counseling on the importance of adherence to annual lung cancer LDCT screening, impact of co-morbidities, and ability or willingness to undergo diagnosis and treatment is imperative for compliance of the program.   Counseling on the importance of continued smoking cessation for former smokers; the importance of smoking cessation for current smokers, and information about tobacco cessation interventions have  been given to patient including [place] and [facility] quit [place] programs.   Written order for lung cancer screening with LDCT has been given to the patient and any and all questions have been answered to the best of my abilities.    Yearly follow up will be coordinated by Burgess Estelle, Thoracic Navigator.  I provided 15 minutes of face-to-face video visit time during this encounter, and > 50% was spent counseling as documented under my assessment & plan.   Jacquelin Hawking, NP

## 2020-10-30 ENCOUNTER — Encounter: Payer: Self-pay | Admitting: *Deleted

## 2020-12-11 ENCOUNTER — Other Ambulatory Visit (INDEPENDENT_AMBULATORY_CARE_PROVIDER_SITE_OTHER): Payer: Self-pay | Admitting: Nurse Practitioner

## 2020-12-11 DIAGNOSIS — I714 Abdominal aortic aneurysm, without rupture, unspecified: Secondary | ICD-10-CM

## 2020-12-11 DIAGNOSIS — I8003 Phlebitis and thrombophlebitis of superficial vessels of lower extremities, bilateral: Secondary | ICD-10-CM

## 2020-12-12 ENCOUNTER — Other Ambulatory Visit (INDEPENDENT_AMBULATORY_CARE_PROVIDER_SITE_OTHER): Payer: Medicare Other

## 2020-12-12 ENCOUNTER — Encounter (INDEPENDENT_AMBULATORY_CARE_PROVIDER_SITE_OTHER): Payer: Medicare Other

## 2020-12-12 ENCOUNTER — Ambulatory Visit (INDEPENDENT_AMBULATORY_CARE_PROVIDER_SITE_OTHER): Payer: Medicare Other | Admitting: Nurse Practitioner

## 2021-01-02 ENCOUNTER — Ambulatory Visit (INDEPENDENT_AMBULATORY_CARE_PROVIDER_SITE_OTHER): Payer: BC Managed Care – PPO

## 2021-01-02 ENCOUNTER — Ambulatory Visit (INDEPENDENT_AMBULATORY_CARE_PROVIDER_SITE_OTHER): Payer: BC Managed Care – PPO | Admitting: Nurse Practitioner

## 2021-01-02 ENCOUNTER — Encounter (INDEPENDENT_AMBULATORY_CARE_PROVIDER_SITE_OTHER): Payer: Self-pay | Admitting: Nurse Practitioner

## 2021-01-02 ENCOUNTER — Other Ambulatory Visit: Payer: Self-pay

## 2021-01-02 VITALS — BP 153/80 | HR 43 | Ht 78.0 in | Wt 223.0 lb

## 2021-01-02 DIAGNOSIS — I714 Abdominal aortic aneurysm, without rupture, unspecified: Secondary | ICD-10-CM

## 2021-01-02 DIAGNOSIS — I8003 Phlebitis and thrombophlebitis of superficial vessels of lower extremities, bilateral: Secondary | ICD-10-CM

## 2021-01-02 DIAGNOSIS — E785 Hyperlipidemia, unspecified: Secondary | ICD-10-CM | POA: Diagnosis not present

## 2021-01-13 ENCOUNTER — Encounter (INDEPENDENT_AMBULATORY_CARE_PROVIDER_SITE_OTHER): Payer: Self-pay | Admitting: Nurse Practitioner

## 2021-01-13 NOTE — Progress Notes (Signed)
Subjective:    Patient ID: Jared [name], male    DOB: December 24, 1955, 65 y.o.   MRN: [id number] Chief Complaint  Patient presents with   Follow-up    25Mo U/S    Jared [name] is a 65 year old male that presents today for evaluation of painful varicose veins.  The patient initially presented due to noticing red tender areas on his left calf.  He had a DVT study which showed evidence of thrombophlebitis in the left calf area.  Once he presented to our office for evaluation it was felt that he had extensive superficial thrombophlebitis bilaterally.  The patient was started on Eliquis due to the extensiveness, as well as propagation of his bilateral thrombophlebitis.  Shortly after beginning Eliquis the patient began having blurred vision.  He was sent to urology for work-up of possible causes and was incidentally found to have an abdominal aortic aneurysm.  This is measured at 3.5 cm.  The patient also notes that he has not restarted his Eliquis.  He notes that the red painful areas have subsided.  He denies any chest pain or shortness of breath.  He denies any abdominal, back pain or signs symptoms of distal embolization.  The patient has an abdominal aortic aneurysm measuring 3.5 cm on ultrasound today.  This is consistent with the CT scan.  Noninvasive studies show evidence of deep venous insufficiency seen bilaterally.  No evidence of DVT noted.  Evidence of chronic thrombus noted in the bilateral great saphenous veins as well as in the right small saphenous vein.  Patient does also have reflux in the bilateral great saphenous veins.     Review of Systems  Cardiovascular:  Negative for leg swelling.  All other systems reviewed and are negative.     Objective:   Physical Exam Vitals reviewed.  HENT:     Head: Normocephalic.  Cardiovascular:     Rate and Rhythm: Normal rate.     Pulses: Normal pulses.  Pulmonary:     Effort: Pulmonary effort is normal.  Skin:    General: Skin is  warm and dry.  Neurological:     Mental Status: He is alert and oriented to person, place, and time.  Psychiatric:        Mood and Affect: Mood normal.        Behavior: Behavior normal.        Thought Content: Thought content normal.        Judgment: Judgment normal.    BP (!) 153/80   Pulse (!) 43   Ht 6' 6" (1.981 m)   Wt 223 lb (101.2 kg)   BMI 25.77 kg/m   Past Medical History:  Diagnosis Date   Cancer ([place])    Dysphagia    Elevated lipids    History of kidney stones    Neuroendocrine tumor    Tobacco abuse     Social History   Socioeconomic History   Marital status: Married    Spouse name: Not on file   Number of children: Not on file   Years of education: Not on file   Highest education level: Not on file  Occupational History   Not on file  Tobacco Use   Smoking status: Former    Packs/day: 1.00    Years: 45.00    Pack years: 45.00    Types: Cigarettes    Quit date: 01/30/2019    Years since quitting: 1.9   Smokeless tobacco: Never  Substance and Sexual Activity  Alcohol use: No   Drug use: No   Sexual activity: Yes    Birth control/protection: None  Other Topics Concern   Not on file  Social History Narrative   Not on file   Social Determinants of Health   Financial Resource Strain: Not on file  Food Insecurity: Not on file  Transportation Needs: Not on file  Physical Activity: Not on file  Stress: Not on file  Social Connections: Not on file  Intimate Partner Violence: Not on file    Past Surgical History:  Procedure Laterality Date   CHOLECYSTECTOMY     COLONOSCOPY WITH PROPOFOL N/A 03/27/2017   Procedure: COLONOSCOPY WITH PROPOFOL;  Surgeon: Elliott, Robert T, MD;  Location: [facility] ENDOSCOPY;  Service: Endoscopy;  Laterality: N/A;   ESOPHAGOGASTRODUODENOSCOPY (EGD) WITH PROPOFOL N/A 03/27/2017   Procedure: ESOPHAGOGASTRODUODENOSCOPY (EGD) WITH PROPOFOL;  Surgeon: Elliott, Robert T, MD;  Location: [facility] ENDOSCOPY;  Service: Endoscopy;   Laterality: N/A;   hip fracture with rod placement Right     History reviewed. No pertinent family history.  No Known Allergies  CBC Latest Ref Rng & Units 08/27/2008 08/23/2008 08/22/2008  WBC 4.0 - 10.5 K/uL 7.5 10.8(H) 9.8  Hemoglobin 13.0 - 17.0 g/dL 8.7(L) 9.6(L) 9.6 DELTA CHECK NOTED(L)  Hematocrit 39.0 - 52.0 % 25.7(L) 29.4(L) 29.1(L)  Platelets 150 - 400 K/uL 254 215 242 DELTA CHECK NOTED      CMP     Component Value Date/Time   NA 138 08/22/2008 0525   K 4.2 08/22/2008 0525   CL 105 08/22/2008 0525   CO2 28 08/22/2008 0525   GLUCOSE 114 (H) 08/22/2008 0525   BUN 9 08/22/2008 0525   CREATININE 0.90 10/05/2020 1359   CALCIUM 8.5 08/22/2008 0525   GFRNONAA >60 08/22/2008 0525   GFRAA  08/22/2008 0525    >60        The eGFR has been calculated using the MDRD equation. This calculation has not been validated in all clinical situations. eGFR's persistently <60 mL/min signify possible Chronic Kidney Disease.     No results found.     Assessment & Plan:   1. Abdominal aortic aneurysm (AAA) without rupture (HCC) No surgery or intervention at this time. The patient has an asymptomatic abdominal aortic aneurysm that is less than 4 cm in maximal diameter.  I have discussed the natural history of abdominal aortic aneurysm and the small risk of rupture for aneurysm less than 5 cm in size.  However, as these small aneurysms tend to enlarge over time, continued surveillance with ultrasound or CT scan is mandatory.  I have also discussed optimizing medical management with hypertension and lipid control and the importance of abstinence from tobacco.  The patient is also encouraged to exercise a minimum of 30 minutes 4 times a week.  Should the patient develop new onset abdominal or back pain or signs of peripheral embolization they are instructed to seek medical attention immediately and to alert the physician providing care that they have an aneurysm.  The patient voices their  understanding. The patient will return in 12 months with an aortic duplex.   2. Saphenous vein thrombophlebitis, bilateral The patient's thrombophlebitis now developed into a chronic state.  The patient has not been taking Eliquis so there is no need to restart at this time.  Patient is advised to continue with his conservative therapy of using compression socks and elevating his legs.  Patient is advised that if they recur he should contact her office otherwise   we will reevaluate in 1 year.  3. Hyperlipidemia, unspecified hyperlipidemia type Continue statin as ordered and reviewed, no changes at this time    Current Outpatient Medications on File Prior to Visit  Medication Sig Dispense Refill   clonazePAM (KLONOPIN) 1 MG tablet Take 0.5 mg by mouth at bedtime.      escitalopram (LEXAPRO) 10 MG tablet Take 1/2 tab a day for 4-6 days, then 1 tab daily.     ibuprofen (ADVIL,MOTRIN) 200 MG tablet Take 200 mg by mouth every 6 (six) hours as needed.     Meth-Hyo-M Bl-Na Phos-Ph Sal (URIBEL) 118 MG CAPS Take 1 capsule (118 mg total) by mouth 4 (four) times daily as needed. 180 capsule 6   Multiple Vitamins-Minerals (CENTRUM SILVER 50+MEN PO) Take by mouth.     RIVAROXABAN (XARELTO) VTE STARTER PACK (15 & 20 MG) Follow package directions: Take one 33m tablet by mouth twice a day. On day 22, switch to one 287mtablet once a day. Take with food. 51 each 0   tadalafil (CIALIS) 5 MG tablet Take 1 tablet (5 mg total) by mouth daily as needed for erectile dysfunction. 30 tablet 6   Current Facility-Administered Medications on File Prior to Visit  Medication Dose Route Frequency Provider Last Rate Last Admin   lidocaine (XYLOCAINE) 2 % jelly 1 application  1 application Urethral Once SnBilley CoMD        There are no Patient Instructions on file for this visit. No follow-ups on file.   FaKris HartmannNP

## 2021-04-24 ENCOUNTER — Ambulatory Visit: Payer: Self-pay | Admitting: Surgery

## 2021-04-24 NOTE — H&P (Signed)
Subjective:  CC: Localized swelling, mass and lump, trunk [R22.2]  HPI:  Jared [name] is a 65 y.o. male who was referred by Dennison Nancy,* for evaluation of above. First noted several years ago.  Occasional pain with pressure, but otherwise asymptomatic.  Thinks it may have slightly enlarged over the past year or so.   Past Medical History:  has a past medical history of Arthritis (1998), Cancer (CMS-HCC) (2009), Colon polyp (Ongoing), Depression (2008), GERD (gastroesophageal reflux disease), colonic polyps (12/05/2016), Kidney disease, Pancreatic cancer (CMS-HCC) (12/20/2007), and Pill dysphagia, unspecified (03/17/2017).  Past Surgical History:  has a past surgical history that includes Cholecystectomy (2003); Colonoscopy (08/13/2004, 07/06/2001); Excision Lesion Pancreas (2009); Hip arthrotomy (2010); ureterogram retrograde w/wo kub (Bilateral, 04/02/2015); pr cysto/uretero w/lithotripsy &indwell stent insrt (Bilateral, 04/02/2015); Colonoscopy (04/02/2012); Polypectomy (Last Colonoscopy); Upper gastrointestinal endoscopy (12-15 years ago); Colonoscopy (03/27/2017); and egd (03/27/2017).  Family History: family history includes Aneurysm in his father; Brain cancer in his maternal uncle; Cancer in his brother and mother; Colon polyps in his father; Coronary Artery Disease (Blocked arteries around heart) in his father; Stroke in his father.  Social History:  reports that he quit smoking about 2 years ago. His smoking use included cigarettes. He started smoking about 47 years ago. He has a 45.00 pack-year smoking history. He has never used smokeless tobacco. He reports previous alcohol use. He reports previous drug use.  Current Medications: has a current medication list which includes the following prescription(s): escitalopram oxalate, uribel, methocarbamol, multivit-min/fa/lycopen/lutein, and tadalafil.  Allergies:  No Known Allergies  ROS:  A 15 point review of systems was performed  and pertinent positives and negatives noted in HPI   Objective:    BP (!) 149/81   Pulse 56   Ht 195.6 cm (6\' 5" )   Wt (!) 102.1 kg (225 lb)   BMI 26.68 kg/m   Constitutional :  No distress, cooperative, alert Lymphatics/Throat:  Supple with no lymphadenopathy Respiratory:  Clear to auscultation bilaterally Cardiovascular:  Regular rate and rhythm Gastrointestinal: Soft, non-tender, non-distended, no organomegaly. Musculoskeletal: Steady gait and movement Skin: Cool and moist, no surgical scars.  Left gluteus, towards the fold near ischial tuberosity is mass like tissue that is soft, mobile, with excess skin tag buildup on top.  No evidence of infection. Psychiatric: Normal affect, non-agitated, not confused       LABS:  n/a   RADS: n/a  Assessment:     Localized swelling, mass and lump, trunk [R22.2] Likely lipoma with chronic skin changes due to location.  Plan:    1. Localized swelling, mass and lump, trunk [R22.2] Discussed surgical excision.  Alternatives include continued observation.  Benefits include possible symptom relief, pathologic evaluation, improved cosmesis. Discussed the risk of surgery including recurrence, chronic pain, post-op infxn, poor cosmesis, poor/delayed wound healing, and possible re-operation to address said risks. The risks of general anesthetic, if used, includes MI, CVA, sudden death or even reaction to anesthetic medications also discussed.  Typical post-op recovery time of 3-5 days with possible activity restrictions were also discussed.  The patient verbalized understanding and all questions were answered to the patient's satisfaction.  2. Patient has elected to proceed with surgical treatment. Procedure will be scheduled. Prone position, likely elliptical incision to remove excess skin.  Written consent was obtained.

## 2021-04-24 NOTE — H&P (View-Only) (Signed)
Subjective:  CC: Localized swelling, mass and lump, trunk [R22.2]  HPI:  Jared [name] is a 65 y.o. male who was referred by Dennison Nancy,* for evaluation of above. First noted several years ago.  Occasional pain with pressure, but otherwise asymptomatic.  Thinks it may have slightly enlarged over the past year or so.   Past Medical History:  has a past medical history of Arthritis (1998), Cancer (CMS-HCC) (2009), Colon polyp (Ong[place]ing), Depression (2008), GERD (gastroesophageal reflux disease), colonic polyps (12/05/2016), Kidney disease, Pancreatic cancer (CMS-HCC) (12/20/2007), and Pill dysphagia, unspecified (03/17/2017).  Past Surgical History:  has a past surgical history that includes Cholecystectomy (2003); Colonoscopy (08/13/2004, 07/06/2001); Excision Lesion Pancreas (2009); Hip arthrotomy (2010); ureterogram retrograde w/wo kub (Bilateral, 04/02/2015); pr cysto/uretero w/lithotripsy &indwell stent insrt (Bilateral, 04/02/2015); Colonoscopy (04/02/2012); Polypectomy (Last Colonoscopy); Upper gastrointestinal endoscopy (12-15 years ago); Colonoscopy (03/27/2017); and egd (03/27/2017).  Family History: family history includes Aneurysm in his father; Brain cancer in his maternal uncle; Cancer in his brother and mother; Colon polyps in his father; Coronary Artery Disease (Blocked arteries around heart) in his father; Stroke in his father.  Social History:  reports that he quit smoking about 2 years ago. His smoking use included cigarettes. He started smoking about 47 years ago. He has a 45.00 pack-year smoking history. He has never used smokeless tobacco. He reports previous alcohol use. He reports previous drug use.  Current Medications: has a current medication list which includes the following prescription(s): escitalopram oxalate, uribel, methocarbamol, multivit-min/fa/lycopen/lutein, and tadalafil.  Allergies:  No Known Allergies  ROS:  A 15 point review of systems was performed  and pertinent positives and negatives noted in HPI   Objective:    BP (!) 149/81   Pulse 56   Ht 195.6 cm (6\' 5" )   Wt (!) 102.1 kg (225 lb)   BMI 26.68 kg/m   Constitutional :  No distress, cooperative, alert Lymphatics/Throat:  Supple with no lymphadenopathy Respiratory:  Clear to auscultation bilaterally Cardiovascular:  Regular rate and rhythm Gastrointestinal: Soft, non-tender, non-distended, no organomegaly. Musculoskeletal: Steady gait and movement Skin: Cool and moist, no surgical scars.  Left gluteus, towards the fold near ischial tuberosity is mass like tissue that is soft, mobile, with excess skin tag buildup on top.  No evidence of infection. Psychiatric: Normal affect, non-agitated, not confused       LABS:  n/a   RADS: n/a  Assessment:     Localized swelling, mass and lump, trunk [R22.2] Likely lipoma with chronic skin changes due to location.  Plan:    1. Localized swelling, mass and lump, trunk [R22.2] Discussed surgical excision.  Alternatives include continued observation.  Benefits include possible symptom relief, pathologic evaluation, improved cosmesis. Discussed the risk of surgery including recurrence, chronic pain, post-op infxn, poor cosmesis, poor/delayed wound healing, and possible re-operation to address said risks. The risks of general anesthetic, if used, includes MI, CVA, sudden death or even reaction to anesthetic medications also discussed.  Typical post-op recovery time of 3-5 days with possible activity restrictions were also discussed.  The patient verbalized understanding and all questions were answered to the patient's satisfaction.  2. Patient has elected to proceed with surgical treatment. Procedure will be scheduled. Prone position, likely elliptical incision to remove excess skin.  Written consent was obtained.

## 2021-05-15 ENCOUNTER — Other Ambulatory Visit: Payer: Self-pay

## 2021-05-15 ENCOUNTER — Other Ambulatory Visit
Admission: RE | Admit: 2021-05-15 | Discharge: 2021-05-15 | Disposition: A | Discharge status: Home / Self Care | Payer: BC Managed Care – PPO | Source: Ambulatory Visit | Attending: Surgery | Admitting: Surgery

## 2021-05-15 VITALS — Ht 78.0 in | Wt 225.0 lb

## 2021-05-15 DIAGNOSIS — G459 Transient cerebral ischemic attack, unspecified: Secondary | ICD-10-CM

## 2021-05-15 DIAGNOSIS — I499 Cardiac arrhythmia, unspecified: Secondary | ICD-10-CM

## 2021-05-15 DIAGNOSIS — Z720 Tobacco use: Secondary | ICD-10-CM | POA: Insufficient documentation

## 2021-05-15 DIAGNOSIS — E785 Hyperlipidemia, unspecified: Secondary | ICD-10-CM | POA: Insufficient documentation

## 2021-05-15 DIAGNOSIS — I493 Ventricular premature depolarization: Secondary | ICD-10-CM | POA: Diagnosis not present

## 2021-05-15 DIAGNOSIS — Z01810 Encounter for preprocedural cardiovascular examination: Secondary | ICD-10-CM | POA: Diagnosis not present

## 2021-05-15 HISTORY — DX: Other complications of anesthesia, initial encounter: T88.59XA

## 2021-05-15 HISTORY — DX: Depression, unspecified: F32.A

## 2021-05-15 HISTORY — DX: Personal history of other venous thrombosis and embolism: Z86.718

## 2021-05-15 LAB — CBC
HCT: 42.7 % (ref 39.0–52.0)
Hemoglobin: 14.8 g/dL (ref 13.0–17.0)
MCH: 33 pg (ref 26.0–34.0)
MCHC: 34.7 g/dL (ref 30.0–36.0)
MCV: 95.3 fL (ref 80.0–100.0)
Platelets: 251 10*3/uL (ref 150–400)
RBC: 4.48 MIL/uL (ref 4.22–5.81)
RDW: 14.7 % (ref 11.5–15.5)
WBC: 6.7 10*3/uL (ref 4.0–10.5)
nRBC: 0 % (ref 0.0–0.2)

## 2021-05-15 NOTE — Patient Instructions (Signed)
Your procedure is scheduled on: 05/24/21 Report to [place]. To find out your arrival time please call [phone] between 1PM - 3PM on 05/23/21.  Remember: Instructions that are not followed completely may result in serious medical risk, up to and including death, or upon the discretion of your surgeon and anesthesiologist your surgery may need to be rescheduled.     _X__ 1. Do not eat food after midnight the night before your procedure.                 No gum chewing or hard candies. You may drink clear liquids up to 2 hours                 before you are scheduled to arrive for your surgery- DO not drink clear                 liquids within 2 hours of the start of your surgery.                 Clear Liquids include:  water, apple juice without pulp, clear carbohydrate                 drink such as Clearfast or Gatorade, Black Coffee or Tea (Do not add                 anything to coffee or tea). Diabetics water only  __X__2.  On the morning of surgery brush your teeth with toothpaste and water, you                 may rinse your mouth with mouthwash if you wish.  Do not swallow any              toothpaste of mouthwash.     _X__ 3.  No Alcohol for 24 hours before or after surgery.   _X__ 4.  Do Not Smoke or use e-cigarettes For 24 Hours Prior to Your Surgery.                 Do not use any chewable tobacco products for at least 6 hours prior to                 surgery.  ____  5.  Bring all medications with you on the day of surgery if instructed.   __X__  6.  Notify your doctor if there is any change in your medical condition      (cold, fever, infections).     Do not wear jewelry, make-up, hairpins, clips or nail polish. Do not wear lotions, powders, or perfumes.  Do not shave body hair 48 hours prior to surgery. Men may shave face and neck. Do not bring valuables to the hospital.    [facility] is not responsible for any  belongings or valuables.  Contacts, dentures/partials or body piercings may not be worn into surgery. Bring a case for your contacts, glasses or hearing aids, a denture cup will be supplied. Leave your suitcase in the car. After surgery it may be brought to your room. For patients admitted to the hospital, discharge time is determined by your treatment team.   Patients discharged the day of surgery will not be allowed to drive home.   Please read over the following fact sheets that you were given:   CHG SOAP  __X__ Take these medicines the morning of surgery with A SIP OF WATER:  1. escitalopram (LEXAPRO) 20 MG tablet  2.   3.   4.  5.  6.  ____ Fleet Enema (as directed)   __X__ Use CHG Soap/SAGE wipes as directed  ____ Use inhalers on the day of surgery  ____ Stop metformin/Janumet/Farxiga 2 days prior to surgery    ____ Take 1/2 of usual insulin dose the night before surgery. No insulin the morning          of surgery.   ____ Stop Blood Thinners Coumadin/Plavix/Xarelto/Pleta/Pradaxa/Eliquis/Effient/Aspirin  on   Or contact your Surgeon, Cardiologist or Medical Doctor regarding  ability to stop your blood thinners  __X__ Stop Anti-inflammatories 7 days before surgery such as Advil, Ibuprofen, Motrin,  BC or Goodies Powder, Naprosyn, Naproxen, Aleve, Aspirin    __X__ Stop all herbal supplements, fish oil or vitamin E until after surgery.    ____ Bring C-Pap to the hospital.

## 2021-05-24 ENCOUNTER — Ambulatory Visit
Admission: RE | Admit: 2021-05-24 | Discharge: 2021-05-24 | Disposition: A | Discharge status: Home / Self Care | Payer: BC Managed Care – PPO | Attending: Surgery | Admitting: Surgery

## 2021-05-24 ENCOUNTER — Ambulatory Visit: Payer: BC Managed Care – PPO | Admitting: Anesthesiology

## 2021-05-24 ENCOUNTER — Other Ambulatory Visit: Payer: Self-pay

## 2021-05-24 ENCOUNTER — Encounter: Payer: Self-pay | Admitting: Surgery

## 2021-05-24 ENCOUNTER — Encounter
Admission: RE | Disposition: A | Discharge status: Home / Self Care | Payer: Self-pay | Source: Home / Self Care | Attending: Surgery

## 2021-05-24 DIAGNOSIS — R222 Localized swelling, mass and lump, trunk: Secondary | ICD-10-CM | POA: Diagnosis present

## 2021-05-24 DIAGNOSIS — G4730 Sleep apnea, unspecified: Secondary | ICD-10-CM | POA: Diagnosis not present

## 2021-05-24 DIAGNOSIS — D215 Benign neoplasm of connective and other soft tissue of pelvis: Secondary | ICD-10-CM | POA: Insufficient documentation

## 2021-05-24 DIAGNOSIS — I714 Abdominal aortic aneurysm, without rupture, unspecified: Secondary | ICD-10-CM | POA: Diagnosis not present

## 2021-05-24 DIAGNOSIS — L720 Epidermal cyst: Secondary | ICD-10-CM | POA: Insufficient documentation

## 2021-05-24 DIAGNOSIS — J449 Chronic obstructive pulmonary disease, unspecified: Secondary | ICD-10-CM | POA: Insufficient documentation

## 2021-05-24 DIAGNOSIS — Z6826 Body mass index (BMI) 26.0-26.9, adult: Secondary | ICD-10-CM | POA: Diagnosis not present

## 2021-05-24 DIAGNOSIS — E669 Obesity, unspecified: Secondary | ICD-10-CM | POA: Diagnosis not present

## 2021-05-24 DIAGNOSIS — Z87891 Personal history of nicotine dependence: Secondary | ICD-10-CM | POA: Diagnosis not present

## 2021-05-24 HISTORY — PX: MASS EXCISION: SHX2000

## 2021-05-24 SURGERY — EXCISION MASS
Anesthesia: General | Site: Buttocks | Laterality: Left

## 2021-05-24 MED ORDER — PHENYLEPHRINE HCL (PRESSORS) 10 MG/ML IV SOLN
INTRAVENOUS | Status: DC | PRN
  Administered 2021-05-24 (×2): 100 ug via INTRAVENOUS

## 2021-05-24 MED ORDER — ACETAMINOPHEN 325 MG PO TABS: 650.0000 mg | ORAL_TABLET | Freq: Three times a day (TID) | ORAL | 0 refills | Status: AC | PRN

## 2021-05-24 MED ORDER — SUGAMMADEX SODIUM 200 MG/2ML IV SOLN
INTRAVENOUS | Status: DC | PRN
  Administered 2021-05-24: 200 mg via INTRAVENOUS

## 2021-05-24 MED ORDER — ACETAMINOPHEN 10 MG/ML IV SOLN: 1000.0000 mg | Freq: Once | INTRAVENOUS | Status: DC | PRN

## 2021-05-24 MED ORDER — ONDANSETRON HCL 4 MG/2ML IJ SOLN
INTRAMUSCULAR | Status: DC | PRN
  Administered 2021-05-24: 4 mg via INTRAVENOUS

## 2021-05-24 MED ORDER — OXYCODONE HCL 5 MG/5ML PO SOLN: 5.0000 mg | Freq: Once | ORAL | Status: DC | PRN

## 2021-05-24 MED ORDER — FAMOTIDINE 20 MG PO TABS
ORAL_TABLET | ORAL | Status: AC
  Administered 2021-05-24: 20 mg via ORAL
  Filled 2021-05-24: qty 1

## 2021-05-24 MED ORDER — PROPOFOL 10 MG/ML IV BOLUS
INTRAVENOUS | Status: DC | PRN
  Administered 2021-05-24: 200 mg via INTRAVENOUS

## 2021-05-24 MED ORDER — LIDOCAINE HCL 1 % IJ SOLN
INTRAMUSCULAR | Status: DC | PRN
  Administered 2021-05-24: 15 mL

## 2021-05-24 MED ORDER — CHLORHEXIDINE GLUCONATE CLOTH 2 % EX PADS
6.0000 | MEDICATED_PAD | Freq: Once | CUTANEOUS | Status: AC
  Administered 2021-05-24: 6 via TOPICAL

## 2021-05-24 MED ORDER — CHLORHEXIDINE GLUCONATE 0.12 % MT SOLN
OROMUCOSAL | Status: AC
  Administered 2021-05-24: 15 mL via OROMUCOSAL
  Filled 2021-05-24: qty 15

## 2021-05-24 MED ORDER — BUPIVACAINE-EPINEPHRINE (PF) 0.5% -1:200000 IJ SOLN
INTRAMUSCULAR | Status: AC
  Filled 2021-05-24: qty 30

## 2021-05-24 MED ORDER — CHLORHEXIDINE GLUCONATE 0.12 % MT SOLN: 15.0000 mL | Freq: Once | OROMUCOSAL | Status: AC

## 2021-05-24 MED ORDER — FENTANYL CITRATE (PF) 100 MCG/2ML IJ SOLN
INTRAMUSCULAR | Status: AC
  Filled 2021-05-24: qty 2

## 2021-05-24 MED ORDER — ROCURONIUM BROMIDE 100 MG/10ML IV SOLN
INTRAVENOUS | Status: DC | PRN
  Administered 2021-05-24: 40 mg via INTRAVENOUS

## 2021-05-24 MED ORDER — LACTATED RINGERS IV SOLN: INTRAVENOUS | Status: DC

## 2021-05-24 MED ORDER — FENTANYL CITRATE (PF) 100 MCG/2ML IJ SOLN
INTRAMUSCULAR | Status: DC | PRN
  Administered 2021-05-24 (×2): 50 ug via INTRAVENOUS

## 2021-05-24 MED ORDER — LIDOCAINE HCL (PF) 1 % IJ SOLN
INTRAMUSCULAR | Status: AC
  Filled 2021-05-24: qty 30

## 2021-05-24 MED ORDER — CEFAZOLIN SODIUM-DEXTROSE 2-4 GM/100ML-% IV SOLN
2.0000 g | INTRAVENOUS | Status: AC
  Administered 2021-05-24: 2 g via INTRAVENOUS

## 2021-05-24 MED ORDER — ONDANSETRON HCL 4 MG/2ML IJ SOLN: 4.0000 mg | Freq: Once | INTRAMUSCULAR | Status: DC | PRN

## 2021-05-24 MED ORDER — 0.9 % SODIUM CHLORIDE (POUR BTL) OPTIME
TOPICAL | Status: DC | PRN
  Administered 2021-05-24: 500 mL

## 2021-05-24 MED ORDER — DOCUSATE SODIUM 100 MG PO CAPS: 100.0000 mg | ORAL_CAPSULE | Freq: Two times a day (BID) | ORAL | 0 refills | Status: AC | PRN

## 2021-05-24 MED ORDER — OXYCODONE HCL 5 MG PO TABS: 5.0000 mg | ORAL_TABLET | Freq: Once | ORAL | Status: DC | PRN

## 2021-05-24 MED ORDER — LIDOCAINE HCL (CARDIAC) PF 100 MG/5ML IV SOSY
PREFILLED_SYRINGE | INTRAVENOUS | Status: DC | PRN
  Administered 2021-05-24: 80 mg via INTRAVENOUS

## 2021-05-24 MED ORDER — EPHEDRINE SULFATE 50 MG/ML IJ SOLN
INTRAMUSCULAR | Status: DC | PRN
  Administered 2021-05-24 (×2): 10 mg via INTRAVENOUS
  Administered 2021-05-24: 5 mg via INTRAVENOUS

## 2021-05-24 MED ORDER — ORAL CARE MOUTH RINSE: 15.0000 mL | Freq: Once | OROMUCOSAL | Status: AC

## 2021-05-24 MED ORDER — FENTANYL CITRATE (PF) 100 MCG/2ML IJ SOLN
25.0000 ug | INTRAMUSCULAR | Status: DC | PRN
  Administered 2021-05-24: 25 ug via INTRAVENOUS

## 2021-05-24 MED ORDER — DEXAMETHASONE SODIUM PHOSPHATE 10 MG/ML IJ SOLN
INTRAMUSCULAR | Status: DC | PRN
  Administered 2021-05-24: 5 mg via INTRAVENOUS

## 2021-05-24 MED ORDER — HYDROCODONE-ACETAMINOPHEN 5-325 MG PO TABS: 1.0000 | ORAL_TABLET | Freq: Four times a day (QID) | ORAL | 0 refills | Status: DC | PRN

## 2021-05-24 MED ORDER — PROPOFOL 10 MG/ML IV BOLUS
INTRAVENOUS | Status: AC
  Filled 2021-05-24: qty 40

## 2021-05-24 MED ORDER — GLYCOPYRROLATE 0.2 MG/ML IJ SOLN
INTRAMUSCULAR | Status: DC | PRN
  Administered 2021-05-24: .2 mg via INTRAVENOUS

## 2021-05-24 MED ORDER — CEFAZOLIN SODIUM-DEXTROSE 2-4 GM/100ML-% IV SOLN
INTRAVENOUS | Status: AC
  Filled 2021-05-24: qty 100

## 2021-05-24 MED ORDER — FAMOTIDINE 20 MG PO TABS: 20.0000 mg | ORAL_TABLET | Freq: Once | ORAL | Status: AC

## 2021-05-24 SURGICAL SUPPLY — 34 items
ADH SKN CLS APL DERMABOND .7 (GAUZE/BANDAGES/DRESSINGS) ×1
APL PRP STRL LF DISP 70% ISPRP (MISCELLANEOUS) ×1
BLADE SURG 15 STRL LF DISP TIS (BLADE) ×1 IMPLANT
BLADE SURG 15 STRL SS (BLADE) ×2
CHLORAPREP W/TINT 26 (MISCELLANEOUS) ×2 IMPLANT
DERMABOND ADVANCED (GAUZE/BANDAGES/DRESSINGS) ×1
DERMABOND ADVANCED .7 DNX12 (GAUZE/BANDAGES/DRESSINGS) ×1 IMPLANT
DRAPE 3/4 80X56 (DRAPES) ×2 IMPLANT
DRAPE LAPAROTOMY 100X77 ABD (DRAPES) ×2 IMPLANT
ELECT CAUTERY BLADE 6.4 (BLADE) ×2 IMPLANT
ELECT REM PT RETURN 9FT ADLT (ELECTROSURGICAL) ×2
ELECTRODE REM PT RTRN 9FT ADLT (ELECTROSURGICAL) ×1 IMPLANT
GAUZE 4X4 16PLY ~~LOC~~+RFID DBL (SPONGE) ×2 IMPLANT
GLOVE SURG SYN 6.5 ES PF (GLOVE) ×2 IMPLANT
GLOVE SURG UNDER POLY LF SZ7 (GLOVE) ×2 IMPLANT
GOWN STRL REUS W/ TWL LRG LVL3 (GOWN DISPOSABLE) ×2 IMPLANT
GOWN STRL REUS W/TWL LRG LVL3 (GOWN DISPOSABLE) ×4
KIT TURNOVER KIT A (KITS) ×2 IMPLANT
LABEL OR SOLS (LABEL) ×2 IMPLANT
MANIFOLD NEPTUNE II (INSTRUMENTS) ×2 IMPLANT
NEEDLE HYPO 22GX1.5 SAFETY (NEEDLE) ×2 IMPLANT
NS IRRIG 1000ML POUR BTL (IV SOLUTION) ×2 IMPLANT
PACK BASIN MINOR ARMC (MISCELLANEOUS) ×2 IMPLANT
SUT ETHILON 3-0 FS-10 30 BLK (SUTURE)
SUT MNCRL 4-0 (SUTURE) ×2
SUT MNCRL 4-0 27XMFL (SUTURE) ×1
SUT SILK 3-0 (SUTURE) ×2 IMPLANT
SUT VIC AB 3-0 SH 27 (SUTURE) ×2
SUT VIC AB 3-0 SH 27X BRD (SUTURE) ×1 IMPLANT
SUTURE EHLN 3-0 FS-10 30 BLK (SUTURE) IMPLANT
SUTURE MNCRL 4-0 27XMF (SUTURE) ×1 IMPLANT
SYR 30ML LL (SYRINGE) ×2 IMPLANT
TOWEL OR 17X26 4PK STRL BLUE (TOWEL DISPOSABLE) ×2 IMPLANT
WATER STERILE IRR 500ML POUR (IV SOLUTION) ×2 IMPLANT

## 2021-05-24 NOTE — Interval H&P Note (Signed)
History and Physical Interval Note:  05/24/2021 8:36 AM  Jared [name]  has presented today for surgery, with the diagnosis of R22.2 localized selling, mass, lump  of trunk.  The various methods of treatment have been discussed with the patient and family. After consideration of risks, benefits and other options for treatment, the patient has consented to  Procedure(s) with comments: EXCISION MASS (N/A) - Prone postition as a surgical intervention.  The patient's history has been reviewed, patient examined, no change in status, stable for surgery.  I have reviewed the patient's chart and labs.  Questions were answered to the patient's satisfaction.     [name] Lysle Pearl

## 2021-05-24 NOTE — Op Note (Signed)
Pre-Op Dx: gluteal mass, left Post-Op Dx: same Anesthesia: GETA EBL: 83JR Complications:  none apparent Specimen: left gluteal mass Procedure: excisional biopsy of left gluteal mass Surgeon: Lysle Pearl  Indications for procedure: See H&P  Description of Procedure:  Consent obtained, time out performed.  Patient placed in prone position.  Area sterilized and draped in usual position.  Local infused to area previously marked.  6.5cm elliptical incision made through dermis around the palpable mass with 15blade and likely lipoma noted in subcutaneous layer.  The  6.3cm x 5cm x 6.6cm mass, including overlying skin,  removed from surrounding tissue completely using electrocautery, passed off field, marked with long lateral and short superior suture, and sent to pathology. Wound hemostasis noted, then closed in two layer fashion with 4-0 vicryl in interrupted fashion for deep dermal layer, then running 4-0 monocryl in subcuticular fashion for epidermal layer.  Wound then dressed with dermabond.  Pt tolerated procedure well, and transferred to PACU in stable condition. Sponge and instrument count correct at end of procedure.

## 2021-05-24 NOTE — Anesthesia Procedure Notes (Signed)
Procedure Name: Intubation Date/Time: 05/24/2021 8:54 AM Performed by: Chanetta Marshall, CRNA Pre-anesthesia Checklist: Patient identified, Emergency Drugs available, Suction available and Patient being monitored Patient Re-evaluated:Patient Re-evaluated prior to induction Oxygen Delivery Method: Circle system utilized Preoxygenation: Pre-oxygenation with 100% oxygen Induction Type: IV induction Ventilation: Mask ventilation without difficulty Laryngoscope Size: McGraph and 3 Grade View: Grade I Tube type: Oral Tube size: 7.0 mm Number of attempts: 1 Airway Equipment and Method: Video-laryngoscopy Placement Confirmation: ETT inserted through vocal cords under direct vision, positive ETCO2, breath sounds checked- equal and bilateral and CO2 detector Secured at: 24 cm Tube secured with: Tape Dental Injury: Teeth and Oropharynx as per pre-operative assessment

## 2021-05-24 NOTE — Anesthesia Preprocedure Evaluation (Addendum)
Anesthesia Evaluation  Patient identified by MRN, date of birth, ID band Patient awake    Reviewed: Allergy & Precautions, NPO status , Patient's Chart, lab work & pertinent test results  History of Anesthesia Complications Negative for: history of anesthetic complications  Airway Mallampati: III   Neck ROM: Full    Dental  (+) Poor Dentition   Pulmonary sleep apnea and Continuous Positive Airway Pressure Ventilation , COPD, former smoker (quit 01/2019),    Pulmonary exam normal breath sounds clear to auscultation       Cardiovascular Exercise Tolerance: Good Normal cardiovascular exam Rhythm:Regular Rate:Normal  PVCs; AAA less than 4 cm in 12/2020  ECG 05/15/21:  Sinus bradycardia Left axis deviation Incomplete right bundle branch block  Holter 01/11/20: NSR at average rate of 62. There were frequent pvcs (13.91%) with no prolonged ventricular runs. Occasional pacs with longest run of svt was 9 beats. No pauses. Pt events with nsr with pvcs.    Neuro/Psych PSYCHIATRIC DISORDERS Depression    GI/Hepatic negative GI ROS,   Endo/Other  Obesity   Renal/GU Renal disease (nephrolithiasis)     Musculoskeletal   Abdominal   Peds  Hematology Neuroendocrine tumor   Anesthesia Other Findings Reviewed 12/27/19 cardiology note  Reproductive/Obstetrics                            Anesthesia Physical Anesthesia Plan  ASA: 3  Anesthesia Plan: General   Post-op Pain Management:    Induction: Intravenous  PONV Risk Score and Plan: 2 and Ondansetron, Dexamethasone and Treatment may vary due to age or medical condition  Airway Management Planned: Oral ETT  Additional Equipment:   Intra-op Plan:   Post-operative Plan: Extubation in OR  Informed Consent: I have reviewed the patients History and Physical, chart, labs and discussed the procedure including the risks, benefits and alternatives for the  proposed anesthesia with the patient or authorized representative who has indicated his/her understanding and acceptance.     Dental advisory given  Plan Discussed with: CRNA  Anesthesia Plan Comments: (Patient consented for risks of anesthesia including but not limited to:  - adverse reactions to medications - damage to eyes, teeth, lips or other oral mucosa - nerve damage due to positioning  - sore throat or hoarseness - damage to heart, brain, nerves, lungs, other parts of body or loss of life  Informed patient about role of CRNA in peri- and intra-operative care.  Patient voiced understanding.)        Anesthesia Quick Evaluation

## 2021-05-24 NOTE — Discharge Instructions (Addendum)
Removal, Care After This sheet gives you information about how to care for yourself after your procedure. Your health care provider may also give you more specific instructions. If you have problems or questions, contact your health care provider. What can I expect after the procedure? After the procedure, it is common to have: Soreness. Bruising. Itching. Follow these instructions at home: site care Follow instructions from your health care provider about how to take care of your site. Make sure you: Wash your hands with soap and water before and after you change your bandage (dressing). If soap and water are not available, use hand sanitizer. Leave stitches (sutures), skin glue, or adhesive strips in place. These skin closures may need to stay in place for 2 weeks or longer. If adhesive strip edges start to loosen and curl up, you may trim the loose edges. Do not remove adhesive strips completely unless your health care provider tells you to do that. If the area bleeds or bruises, apply gentle pressure for 10 minutes. OK TO SHOWER IN 24HRS  Check your site every day for signs of infection. Check for: Redness, swelling, or pain. Fluid or blood. Warmth. Pus or a bad smell.  General instructions Rest and then return to your normal activities as told by your health care provider. RESUME ASPIRIN IN 48HRS  tylenol and advil as needed for discomfort.  Please alternate between the two every four hours as needed for pain.    Use narcotics, if prescribed, only when tylenol and motrin is not enough to control pain.  325-650mg every 8hrs to max of 3000mg/24hrs (including the 325mg in every norco dose) for the tylenol.    Advil up to 800mg per dose every 8hrs as needed for pain.   Keep all follow-up visits as told by your health care provider. This is important. Contact a health care provider if: You have redness, swelling, or pain around your site. You have fluid or blood coming from your  site. Your site feels warm to the touch. You have pus or a bad smell coming from your site. You have a fever. Your sutures, skin glue, or adhesive strips loosen or come off sooner than expected. Get help right away if: You have bleeding that does not stop with pressure or a dressing. Summary After the procedure, it is common to have some soreness, bruising, and itching at the site. Follow instructions from your health care provider about how to take care of your site. Check your site every day for signs of infection. Contact a health care provider if you have redness, swelling, or pain around your site, or your site feels warm to the touch. Keep all follow-up visits as told by your health care provider. This is important. This information is not intended to replace advice given to you by your health care provider. Make sure you discuss any questions you have with your health care provider. Document Released: 08/03/2015 Document Revised: 01/04/2018 Document Reviewed: 01/04/2018 Elsevier Interactive Patient Education  2019 [employer]  AMBULATORY SURGERY  DISCHARGE INSTRUCTIONS   The drugs that you were given will stay in your system until tomorrow so for the next 24 hours you should not:  Drive an automobile Make any legal decisions Drink any alcoholic beverage   You may resume regular meals tomorrow.  Today it is better to start with liquids and gradually work up to solid foods.  You may eat anything you prefer, but it is better to start with liquids, then soup   and crackers, and gradually work up to solid foods.   Please notify your doctor immediately if you have any unusual bleeding, trouble breathing, redness and pain at the surgery site, drainage, fever, or pain not relieved by medication.    Additional Instructions:        Please contact your physician with any problems or Same Day Surgery at [phone], Monday through Friday 6 am to 4 pm, or [place] at  [place] Main number at [phone].  

## 2021-05-24 NOTE — Transfer of Care (Signed)
Immediate Anesthesia Transfer of Care Note  Patient: Jared [name]  Procedure(s) Performed: EXCISION MASS (Left: Buttocks)  Patient Location: PACU  Anesthesia Type:General  Level of Consciousness: awake, alert  and oriented  Airway & Oxygen Therapy: Patient Spontanous Breathing and Patient connected to nasal cannula oxygen  Post-op Assessment: Report given to RN and Post -op Vital signs reviewed and stable  Post vital signs: Reviewed and stable  Last Vitals:  Vitals Value Taken Time  BP    Temp    Pulse    Resp    SpO2      Last Pain:  Vitals:   05/24/21 0826  TempSrc: Temporal  PainSc: 0-No pain         Complications: No notable events documented.

## 2021-05-24 NOTE — Anesthesia Postprocedure Evaluation (Signed)
Anesthesia Post Note  Patient: Jared [name]  Procedure(s) Performed: EXCISION MASS (Left: Buttocks)  Patient location during evaluation: PACU Anesthesia Type: General Level of consciousness: awake and alert, oriented and patient cooperative Pain management: pain level controlled Vital Signs Assessment: post-procedure vital signs reviewed and stable Respiratory status: spontaneous breathing, nonlabored ventilation and respiratory function stable Cardiovascular status: blood pressure returned to baseline and stable Postop Assessment: adequate PO intake Anesthetic complications: no   No notable events documented.   Last Vitals:  Vitals:   05/24/21 1015 05/24/21 1020  BP: 130/77   Pulse: 67 68  Resp: 14 16  Temp:  (!) 36.3 C  SpO2: 95% 97%    Last Pain:  Vitals:   05/24/21 1020  TempSrc:   PainSc: 2                  Darrin Nipper

## 2021-05-25 ENCOUNTER — Encounter: Payer: Self-pay | Admitting: Surgery

## 2021-05-29 LAB — SURGICAL PATHOLOGY

## 2021-07-12 ENCOUNTER — Ambulatory Visit: Payer: BC Managed Care – PPO | Admitting: Physician Assistant

## 2021-07-25 ENCOUNTER — Other Ambulatory Visit: Payer: Self-pay

## 2021-07-25 ENCOUNTER — Encounter: Payer: BC Managed Care – PPO | Attending: Physician Assistant | Admitting: Physician Assistant

## 2021-07-25 DIAGNOSIS — T8131XA Disruption of external operation (surgical) wound, not elsewhere classified, initial encounter: Secondary | ICD-10-CM | POA: Insufficient documentation

## 2021-07-25 DIAGNOSIS — X58XXXA Exposure to other specified factors, initial encounter: Secondary | ICD-10-CM | POA: Insufficient documentation

## 2021-07-25 DIAGNOSIS — L0231 Cutaneous abscess of buttock: Secondary | ICD-10-CM | POA: Insufficient documentation

## 2021-07-25 DIAGNOSIS — L98412 Non-pressure chronic ulcer of buttock with fat layer exposed: Secondary | ICD-10-CM | POA: Insufficient documentation

## 2021-07-26 NOTE — Progress Notes (Signed)
Jared [name], Jared [name] ([id number]) Visit Report for 07/25/2021 Abuse/Suicide Risk Screen Details Patient Name: Jared [name], Jared [name]. Date of Service: 07/25/2021 8:45 AM Medical Record Number: [id number] Patient Account Number: [id number] Date of Birth/Sex: 07-30-1955 (66 y.o. M) Treating RN: Carlene Coria Primary Care Ragan Duhon: [name] Other Clinician: Referring Kaisey Huseby: Benjamine Sprague Treating Daphanie Oquendo/Extender: Skipper Cliche in Treatment: 0 Abuse/Suicide Risk Screen Items Answer ABUSE RISK SCREEN: Has anyone close to you tried to hurt or harm you recentlyo No Do you feel uncomfortable with anyone in your familyo No Has anyone forced you do things that you didnot want to doo No Electronic Signature(s) Signed: 07/26/2021 4:33:01 PM By: Carlene Coria RN Entered By: Carlene Coria on 07/25/2021 09:05:51 Jared [name] ([id number]) -------------------------------------------------------------------------------- Activities of Daily Living Details Patient Name: Jared [name], Jared [name]. Date of Service: 07/25/2021 8:45 AM Medical Record Number: [id number] Patient Account Number: [id number] Date of Birth/Sex: [birth year] (66 y.o. M) Treating RN: Carlene Coria Primary Care Niomie Englert: [name] Other Clinician: Referring Erma Raiche: Benjamine Sprague Treating Dallis Czaja/Extender: Skipper Cliche in Treatment: 0 Activities of Daily Living Items Answer Activities of Daily Living (Please select one for each item) Drive Automobile Completely Able Take Medications Completely Able Use Telephone Completely Able Care for Appearance Completely Able Use Toilet Completely Able Bath / Shower Completely Able Dress Self Completely Able Feed Self Completely Able Walk Completely Able Get In / Out Bed Completely Able Housework Completely Able Prepare Meals Completely Able Handle Money Completely Able Shop for Self Completely Able Electronic Signature(s) Signed: 07/26/2021 4:33:01 PM By: Carlene Coria RN Entered  By: Carlene Coria on 07/25/2021 09:06:55 Jared [name] ([id number]) -------------------------------------------------------------------------------- Education Screening Details Patient Name: Jared [name]. Date of Service: 07/25/2021 8:45 AM Medical Record Number: [id number] Patient Account Number: [id number] Date of Birth/Sex: [birth year] (66 y.o. M) Treating RN: Carlene Coria Primary Care Jaman Aro: [name] Other Clinician: Referring Madoc Holquin: Benjamine Sprague Treating Tatijana Bierly/Extender: Skipper Cliche in Treatment: 0 Primary Learner Assessed: Patient Learning Preferences/Education Level/Primary Language Learning Preference: Explanation Highest Education Level: College or Above Preferred Language: English Cognitive Barrier Language Barrier: No Translator Needed: No Memory Deficit: No Emotional Barrier: No Cultural/Religious Beliefs Affecting Medical Care: No Physical Barrier Impaired Vision: Yes Glasses Impaired Hearing: No Decreased Hand dexterity: No Knowledge/Comprehension Knowledge Level: Medium Comprehension Level: High Ability to understand written instructions: High Ability to understand verbal instructions: High Motivation Anxiety Level: Anxious Cooperation: Cooperative Education Importance: Acknowledges Need Interest in Health Problems: Asks Questions Perception: Coherent Willingness to Engage in Self-Management High Activities: Readiness to Engage in Self-Management High Activities: Electronic Signature(s) Signed: 07/26/2021 4:33:01 PM By: Carlene Coria RN Entered By: Carlene Coria on 07/25/2021 09:07:31 Jared [name] ([id number]) -------------------------------------------------------------------------------- Fall Risk Assessment Details Patient Name: Jared [name]. Date of Service: 07/25/2021 8:45 AM Medical Record Number: [id number] Patient Account Number: [id number] Date of Birth/Sex: [birth year] (66 y.o. M) Treating RN: Carlene Coria Primary Care Rhiann Boucher: [name] Other Clinician: Referring Cannie Muckle: Benjamine Sprague Treating Rithwik Schmieg/Extender: Skipper Cliche in Treatment: 0 Fall Risk Assessment Items Have you had 2 or more falls in the last 12 monthso 0 No Have you had any fall that resulted in injury in the last 12 monthso 0 No FALLS RISK SCREEN History of falling - immediate or within 3 months 0 No Secondary diagnosis (Do you have 2 or more medical diagnoseso) 0 No Ambulatory aid None/bed rest/wheelchair/nurse 0 No Crutches/cane/walker 0 No Furniture 0 No Intravenous therapy Access/Saline/Heparin Lock 0 No Gait/Transferring Normal/ bed rest/ wheelchair 0 No Weak (short steps  with or without shuffle, stooped but able to lift head while walking, may 0 No seek support from furniture) Impaired (short steps with shuffle, may have difficulty arising from chair, head down, impaired 0 No balance) Mental Status Oriented to own ability 0 No Electronic Signature(s) Signed: 07/26/2021 4:33:01 PM By: Carlene Coria RN Entered By: Carlene Coria on 07/25/2021 09:07:44 Jared [name] ([id number]) -------------------------------------------------------------------------------- Foot Assessment Details Patient Name: Jared [name]. Date of Service: 07/25/2021 8:45 AM Medical Record Number: [id number] Patient Account Number: [id number] Date of Birth/Sex: 07-22-1955 (66 y.o. M) Treating RN: Carlene Coria Primary Care Macyn Shropshire: [name] Other Clinician: Referring Cavin Longman: Benjamine Sprague Treating Stoney Karczewski/Extender: Skipper Cliche in Treatment: 0 Foot Assessment Items Site Locations + = Sensation present, - = Sensation absent, C = Callus, U = Ulcer R = Redness, W = Warmth, M = Maceration, PU = Pre-ulcerative lesion F = Fissure, S = Swelling, D = Dryness Assessment Right: Left: Other Deformity: No No Prior Foot Ulcer: No No Prior Amputation: No No Charcot Joint: No No Ambulatory Status:  Ambulatory Without Help Gait: Steady Electronic Signature(s) Signed: 07/26/2021 4:33:01 PM By: Carlene Coria RN Entered By: Carlene Coria on 07/25/2021 09:08:12 Jared [name] ([id number]) -------------------------------------------------------------------------------- Nutrition Risk Screening Details Patient Name: Jared [name]. Date of Service: 07/25/2021 8:45 AM Medical Record Number: [id number] Patient Account Number: [id number] Date of Birth/Sex: 08-Mar-1956 (66 y.o. M) Treating RN: Carlene Coria Primary Care Nakayla Rorabaugh: [name] Other Clinician: Referring Natacia Chaisson: Benjamine Sprague Treating Tinsley Lomas/Extender: Skipper Cliche in Treatment: 0 Height (in): 78 Weight (lbs): 227 Body Mass Index (BMI): 26.2 Nutrition Risk Screening Items Score Screening NUTRITION RISK SCREEN: I have an illness or condition that made me change the kind and/or amount of food I eat 0 No I eat fewer than two meals per day 0 No I eat few fruits and vegetables, or milk products 0 No I have three or more drinks of beer, liquor or wine almost every day 0 No I have tooth or mouth problems that make it hard for me to eat 0 No I don't always have enough money to buy the food I need 0 No I eat alone most of the time 0 No I take three or more different prescribed or over-the-counter drugs a day 1 Yes Without wanting to, I have lost or gained 10 pounds in the last six months 0 No I am not always physically able to shop, cook and/or feed myself 0 No Nutrition Protocols Good Risk Protocol Moderate Risk Protocol High Risk Proctocol Risk Level: Good Risk Score: 1 Electronic Signature(s) Signed: 07/26/2021 4:33:01 PM By: Carlene Coria RN Entered By: Carlene Coria on 07/25/2021 09:07:54

## 2021-07-26 NOTE — Progress Notes (Signed)
Jared [name] ([id number]) Visit Report for 07/25/2021 Chief Complaint Document Details Patient Name: Jared [name], Jared [name]. Date of Service: 07/25/2021 8:45 AM Medical Record Number: [id number] Patient Account Number: [id number] Date of Birth/Sex: September 04, 1955 (66 y.o. M) Treating RN: Jared [name] Primary Care Provider: Juluis [name] Other Clinician: Referring Provider: Benjamine [name] Treating Provider/Extender: Jared [name] in Treatment: 0 Information Obtained from: Patient Chief Complaint Left gluteal abscess s/p IandD 05/24/21 Electronic Signature(s) Signed: 07/25/2021 9:30:59 AM By: Worthy Keeler PA-C Entered By: Worthy [name] on 07/25/2021 09:30:59 Jared [name] ([id number]) -------------------------------------------------------------------------------- Debridement Details Patient Name: Jared [name]. Date of Service: 07/25/2021 8:45 AM Medical Record Number: [id number] Patient Account Number: [id number] Date of Birth/Sex: 1956-06-28 (66 y.o. M) Treating RN: Jared [name] Primary Care Provider: Juluis [name] Other Clinician: Referring Provider: Benjamine [name] Treating Provider/Extender: Jared [name] in Treatment: 0 Debridement Performed for Wound #1 Left Gluteus Assessment: Performed By: Physician Jared Sams., PA-C Debridement Type: Chemical/Enzymatic/Mechanical Agent Used: Saline and gauze Level of Consciousness (Pre- Awake and Alert procedure): Pre-procedure Verification/Time Out Yes - 09:40 Taken: Start Time: 09:40 Instrument: Other : saline and gauze Bleeding: Minimum Hemostasis Achieved: Pressure End Time: 09:42 Procedural Pain: 0 Post Procedural Pain: 0 Response to Treatment: Procedure was tolerated well Level of Consciousness (Post- Awake and Alert procedure): Post Debridement Measurements of Total Wound Length: (cm) 0.3 Width: (cm) 0.5 Depth: (cm) 1 Volume: (cm) 0.118 Character of Wound/Ulcer Post Debridement: Improved Post  Procedure Diagnosis Same as Pre-procedure Electronic Signature(s) Signed: 07/26/2021 4:33:01 PM By: Jared Coria RN Signed: 07/26/2021 5:21:10 PM By: Worthy Keeler PA-C Entered By: Jared [name] on 07/25/2021 09:46:30 Jared [name] ([id number]) -------------------------------------------------------------------------------- HPI Details Patient Name: Jared [name]. Date of Service: 07/25/2021 8:45 AM Medical Record Number: [id number] Patient Account Number: [id number] Date of Birth/Sex: [birth year] (66 y.o. M) Treating RN: Jared [name] Primary Care Provider: Juluis [name] Other Clinician: Referring Provider: Benjamine [name] Treating Provider/Extender: Jared [name] in Treatment: 0 History of Present Illness HPI Description: 07/25/2021 patient presents today for initial evaluation here in the clinic concerning issues that he has been having with a abscess initially which was on the left gluteal region more lateral. This was actually back at the beginning of November 2022. Subsequently he had an IandD on 05/24/2021. This did initially clean everything out but he has been having issues with it since continuing to drain quite significantly at times. Fortunately he does not have any significant pain he tells me at this point which is great news there is some bleeding he does seem to be very friable and side probably due to the fact that is a lot of fluid and hypergranulation even internally. 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 Not even certain what exactly that occurred. Currently he has been using gauze to pack into the area I think he probably would do better with something like a Hydrofera Blue rope. Electronic Signature(s) Signed: 07/25/2021 9:43:37 AM By: Worthy Keeler PA-C Entered By: Worthy [name] on 07/25/2021 09:43:37 [name], Wamble  Grace [name] ([id number]) -------------------------------------------------------------------------------- Physical Exam Details Patient Name: Jared [name]. Date of Service: 07/25/2021 8:45 AM Medical Record Number: [id number] Patient Account Number: [id number] Date of Birth/Sex: Apr 12, 1956 (66 y.o. M) Treating RN: Jared [name] Primary Care Provider: Juluis [name] Other Clinician: Referring Provider: Benjamine [name] Treating Provider/Extender: Jared [name] in Treatment: 0 Constitutional sitting or standing blood pressure is within target range for patient.. pulse regular and within target range for patient.Marland Kitchen respirations regular, non- labored and within target range for patient.Marland Kitchen temperature within target range for patient.. Well-nourished and well-hydrated in no acute distress. Eyes conjunctiva clear no eyelid edema noted. pupils equal round and reactive to light and accommodation. Ears, Nose, Mouth, and Throat no gross abnormality of ear auricles or external auditory canals. normal hearing noted during conversation. mucus membranes moist. Respiratory normal breathing without difficulty. Musculoskeletal normal gait and posture. no significant deformity or arthritic changes, no loss or range of motion, no clubbing. Psychiatric this patient is able to make decisions and demonstrates good insight into disease process. Alert and Oriented x 3. pleasant and cooperative. Notes Upon inspection patient's wound bed actually appears to be fairly clean I do not see any purulent drainage and when I am actually probing the area he has a region that goes into 11:00 which is the biggest and there is another less deep area which goes into the 8-9 o'clock region. Nonetheless the deepest area at 11:00 is about 3 cm at this point. Electronic Signature(s) Signed: 07/25/2021 9:44:19 AM By: Worthy Keeler PA-C Entered By: Worthy [name] on 07/25/2021 09:44:18 Jared [name]  ([id number]) -------------------------------------------------------------------------------- Physician Orders Details Patient Name: Jared [name]. Date of Service: 07/25/2021 8:45 AM Medical Record Number: [id number] Patient Account Number: [id number] Date of Birth/Sex: 25-Mar-1956 (66 y.o. M) Treating RN: Jared [name] Primary Care Provider: Juluis [name] Other Clinician: Referring Provider: Benjamine [name] Treating Provider/Extender: Jared [name] in Treatment: 0 Verbal / Phone Orders: No Diagnosis Coding ICD-10 Coding Code Description L02.31 Cutaneous abscess of buttock T81.31XA Disruption of external operation (surgical) wound, not elsewhere classified, initial encounter L98.412 Non-pressure chronic ulcer of buttock with fat layer exposed Follow-up Appointments o Return Appointment in 1 week. Bathing/ Shower/ Hygiene o May shower; gently cleanse wound with antibacterial soap, rinse and pat dry prior to dressing wounds Wound Treatment Wound #1 - Gluteus Wound Laterality: Left Cleanser: Soap and Water 1 x Per Day/30 Days Discharge Instructions: Gently cleanse wound with antibacterial soap, rinse and pat dry prior to dressing wounds Primary Dressing: hydrofera blue rope 1 x Per Day/30 Days Discharge Instructions: pack into wound Secondary Dressing: Zetuvit Plus Silicone Border Dressing 4x4 (in/in) (DME) (Generic) 1 x Per Day/30 Days Discharge Instructions: cover wound Electronic Signature(s) Signed: 07/26/2021 4:33:01 PM By: Jared Coria RN Signed: 07/26/2021 5:21:10 PM By: Worthy Keeler PA-C Entered By: Jared [name] on 07/25/2021 09:49:55 Jared [name] ([id number]) -------------------------------------------------------------------------------- Problem List Details Patient Name: Jared [name]. Date of Service: 07/25/2021 8:45 AM Medical Record Number: [id number] Patient Account Number: [id number] Date of Birth/Sex: 1956-05-07 (66 y.o. M) Treating RN: Jared [name] Primary Care Provider: Juluis [name] Other Clinician: Referring Provider: Benjamine [name] Treating Provider/Extender: Jared [name] in Treatment: 0 Active Problems ICD-10 Encounter Code Description Active Date MDM Diagnosis L02.31 Cutaneous abscess of buttock 07/25/2021 No Yes T81.31XA Disruption of external operation (surgical) wound, not elsewhere 07/25/2021 No Yes classified, initial encounter L98.412 Non-pressure chronic ulcer of buttock with fat layer exposed 07/25/2021 No Yes Inactive Problems Resolved Problems Electronic Signature(s) Signed: 07/25/2021 9:29:00 AM By: Worthy Keeler PA-C Entered By: Worthy [name] on 07/25/2021 09:28:59 Jared [name] ([id number]) -------------------------------------------------------------------------------- Progress Note Details Patient Name: Jared [name]. Date of Service: 07/25/2021 8:45 AM Medical Record Number: [id number] Patient Account Number: [id number] Date of Birth/Sex: 05/26/1956 (66 y.o. M) Treating RN:  Jared [name] Primary Care Provider: Juluis [name] Other Clinician: Referring Provider: Benjamine [name] Treating Provider/Extender: Jared [name] in Treatment: 0 Subjective Chief Complaint Information obtained from Patient Left gluteal abscess s/p IandD 05/24/21 History of Present Illness (HPI) 07/25/2021 patient presents today for initial evaluation here in the clinic concerning issues that he has been having with a abscess initially which was on the left gluteal region more lateral. This was actually back at the beginning of November 2022. Subsequently he had an IandD on 05/24/2021. This did initially clean everything out but he has been having issues with it since continuing to drain quite significantly at times. Fortunately he does not have any significant pain he tells me at this point which is great news there is some bleeding he does seem to be very friable and side probably due to the fact that is a lot of  fluid and hypergranulation even internally. 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 Not even certain what exactly that occurred. Currently he has been using gauze to pack into the area I think he probably would do better with something like a Hydrofera Blue rope. Patient History Information obtained from Patient. Allergies No Known Allergies Social History Former smoker, Marital Status - Married, Alcohol Use - Rarely, Drug Use - No History, Caffeine Use - Daily. Review of Systems (ROS) Integumentary (Skin) Complains or has symptoms of Wounds. Oncologic pancratic tumor Objective Constitutional sitting or standing blood pressure is within target range for patient.. pulse regular and within target range for patient.Marland Kitchen respirations regular, non- labored and within target range for patient.Marland Kitchen temperature within target range for patient.. Well-nourished and well-hydrated in no acute distress. Vitals Time Taken: 9:02 AM, Height: 78 in, Source: Stated, Weight: 227 lbs, Source: Stated, BMI: 26.2, Temperature: 98.4 F, Pulse: 78 bpm, Respiratory Rate: 18 breaths/min, Blood Pressure: 138/74 mmHg. Eyes conjunctiva clear no eyelid edema noted. pupils equal round and reactive to light and accommodation. Ears, Nose, Mouth, and Throat no gross abnormality of ear auricles or external auditory canals. normal hearing noted during conversation. mucus membranes moist. Respiratory normal breathing without difficulty. Musculoskeletal normal gait and posture. no significant deformity or arthritic changes, no loss or range of motion, no clubbing. Psychiatric this patient is able to make decisions and demonstrates good insight into disease process. Alert and Oriented x 3. pleasant and cooperative. ROALD, LUKACS ([id number]) General Notes: Upon inspection  patient's wound bed actually appears to be fairly clean I do not see any purulent drainage and when I am actually probing the area he has a region that goes into 11:00 which is the biggest and there is another less deep area which goes into the 8-9 o'clock region. Nonetheless the deepest area at 11:00 is about 3 cm at this point. Integumentary (Hair, Skin) Wound #1 status is Open. Original cause of wound was Surgical Injury. The date acquired was: 05/24/2021. The wound is located on the Left Gluteus. The wound measures 0.3cm length x 0.5cm width x 0.1cm depth; 0.118cm^2 area and 0.012cm^3 volume. There is Fat Layer (Subcutaneous Tissue) exposed. There is no tunneling or undermining noted. There is a medium amount of sanguinous drainage noted. There is large (67-100%) red granulation within the wound bed. There is no necrotic tissue within the wound bed. Assessment Active Problems ICD-10 Cutaneous abscess of buttock Disruption of external operation (  surgical) wound, not elsewhere classified, initial encounter Non-pressure chronic ulcer of buttock with fat layer exposed Plan 1. Would recommend at this point that we actually have the patient go ahead and initiate treatment with Hydrofera Blue rope packing. His wife is a Marine scientist and she can do this for him which is awesome. 2. I am also can recommend that we cover this with a border foam dressing to help with catching the excess drainage. I think it Zetuvit would do well here. 3. I am also going to suggest the patient continue to change this daily for now I think that skin to be the ideal thing and that I will allow for her to keep it nice and clean as well. We will see patient back for reevaluation in 1 week here in the clinic. If anything worsens or changes patient will contact our office for additional recommendations. Electronic Signature(s) Signed: 07/25/2021 9:44:51 AM By: Worthy Keeler PA-C Entered By: Worthy [name] on 07/25/2021  09:44:51 Rylie, Knierim Grace [name] ([id number]) -------------------------------------------------------------------------------- ROS/PFSH Details Patient Name: DARCEY, DEMMA. Date of Service: 07/25/2021 8:45 AM Medical Record Number: [id number] Patient Account Number: [id number] Date of Birth/Sex: May 01, 1956 (66 y.o. M) Treating RN: Jared [name] Primary Care Provider: Juluis [name] Other Clinician: Referring Provider: Benjamine [name] Treating Provider/Extender: Jared [name] in Treatment: 0 Information Obtained From Patient Integumentary (Skin) Complaints and Symptoms: Positive for: Wounds Oncologic Complaints and Symptoms: Review of System Notes: pancratic tumor Immunizations Pneumococcal Vaccine: Received Pneumococcal Vaccination: Yes Received Pneumococcal Vaccination On or After 60th Birthday: Yes Implantable Devices None Family and Social History Former smoker; Marital Status - Married; Alcohol Use: Rarely; Drug Use: No History; Caffeine Use: Daily; Financial Concerns: No; Food, Clothing or Shelter Needs: No; Support System Lacking: No; Transportation Concerns: No Electronic Signature(s) Signed: 07/26/2021 4:33:01 PM By: Jared Coria RN Signed: 07/26/2021 5:21:10 PM By: Worthy Keeler PA-C Entered By: Jared [name] on 07/25/2021 09:05:43 Jared [name] ([id number]) -------------------------------------------------------------------------------- SuperBill Details Patient Name: Jared [name]. Date of Service: 07/25/2021 Medical Record Number: [id number] Patient Account Number: [id number] Date of Birth/Sex: December 20, 1955 (66 y.o. M) Treating RN: Jared [name] Primary Care Provider: Juluis [name] Other Clinician: Referring Provider: Benjamine [name] Treating Provider/Extender: Jared [name] in Treatment: 0 Diagnosis Coding ICD-10 Codes Code Description L02.31 Cutaneous abscess of buttock T81.31XA Disruption of external operation (surgical) wound, not elsewhere classified,  initial encounter L98.412 Non-pressure chronic ulcer of buttock with fat layer exposed Facility Procedures CPT4 Code: 62952841 Description: 99214 - WOUND CARE VISIT-LEV 4 EST PT Modifier: Quantity: 1 Physician Procedures CPT4 Code: 3244010 Description: 27253 - WC PHYS LEVEL 4 - NEW PT Modifier: Quantity: 1 CPT4 Code: Description: ICD-10 Diagnosis Description L02.31 Cutaneous abscess of buttock T81.31XA Disruption of external operation (surgical) wound, not elsewhere classifi L98.412 Non-pressure chronic ulcer of buttock with fat layer exposed Modifier: ed, initial encounter Quantity: Electronic Signature(s) Signed: 07/26/2021 4:33:01 PM By: Jared Coria RN Signed: 07/26/2021 5:21:10 PM By: Worthy Keeler PA-C Previous Signature: 07/25/2021 9:45:43 AM Version By: Worthy Keeler PA-C Entered By: Jared [name] on 07/25/2021 09:50:47

## 2021-07-26 NOTE — Progress Notes (Signed)
[name], [name] ([id number]) Visit Report for 07/25/2021 Allergy List Details Patient Name: Jared [name], Jared [name]. Date of Service: 07/25/2021 8:45 AM Medical Record Number: [id number] Patient Account Number: [id number] Date of Birth/Sex: [birth year] (66 y.o. M) Treating RN: Carlene Coria Primary Care Fredrich Cory: [name] Other Clinician: Referring Antoninette Lerner: Benjamine Sprague Treating Brentlee Sciara/Extender: Jeri Cos Weeks in Treatment: 0 Allergies Active Allergies No Known Allergies Allergy Notes Electronic Signature(s) Signed: 07/26/2021 4:33:01 PM By: Carlene Coria RN Entered By: Carlene Coria on 07/25/2021 09:03:25 Jared [name] ([id number]) -------------------------------------------------------------------------------- Arrival Information Details Patient Name: Jared [name]. Date of Service: 07/25/2021 8:45 AM Medical Record Number: [id number] Patient Account Number: [id number] Date of Birth/Sex: June 14, 1956 (66 y.o. M) Treating RN: Carlene Coria Primary Care Roald Lukacs: [name] Other Clinician: Referring Jalyn Dutta: Benjamine Sprague Treating Tayen Narang/Extender: Skipper Cliche in Treatment: 0 Visit Information Patient Arrived: Ambulatory Arrival Time: 08:55 Accompanied By: self Transfer Assistance: None Patient Identification Verified: Yes Secondary Verification Process Completed: Yes Patient Requires Transmission-Based Precautions: No Patient Has Alerts: No Electronic Signature(s) Signed: 07/26/2021 4:33:01 PM By: Carlene Coria RN Entered By: Carlene Coria on 07/25/2021 09:00:30 Jared [name] ([id number]) -------------------------------------------------------------------------------- Clinic Level of Care Assessment Details Patient Name: Jared [name]. Date of Service: 07/25/2021 8:45 AM Medical Record Number: [id number] Patient Account Number: [id number] Date of Birth/Sex: [birth year] (66 y.o. M) Treating RN: Carlene Coria Primary Care Rondy Krupinski: [name] Other  Clinician: Referring Alroy Portela: Benjamine Sprague Treating Emree Locicero/Extender: Skipper Cliche in Treatment: 0 Clinic Level of Care Assessment Items TOOL 2 Quantity Score X - Use when only an EandM is performed on the INITIAL visit 1 0 ASSESSMENTS - Nursing Assessment / Reassessment X - General Physical Exam (combine w/ comprehensive assessment (listed just below) when performed on new 1 20 pt. evals) X- 1 25 Comprehensive Assessment (HX, ROS, Risk Assessments, Wounds Hx, etc.) ASSESSMENTS - Wound and Skin Assessment / Reassessment X - Simple Wound Assessment / Reassessment - one wound 1 5 []  - 0 Complex Wound Assessment / Reassessment - multiple wounds []  - 0 Dermatologic / Skin Assessment (not related to wound area) ASSESSMENTS - Ostomy and/or Continence Assessment and Care []  - Incontinence Assessment and Management 0 []  - 0 Ostomy Care Assessment and Management (repouching, etc.) PROCESS - Coordination of Care X - Simple Patient / Family Education for ongoing care 1 15 []  - 0 Complex (extensive) Patient / Family Education for ongoing care X- 1 10 Staff obtains Programmer, systems, Records, Test Results / Process Orders []  - 0 Staff telephones HHA, Nursing Homes / Clarify orders / etc []  - 0 Routine Transfer to another Facility (non-emergent condition) []  - 0 Routine Hospital Admission (non-emergent condition) X- 1 15 New Admissions / Biomedical engineer / Ordering NPWT, Apligraf, etc. []  - 0 Emergency Hospital Admission (emergent condition) X- 1 10 Simple Discharge Coordination []  - 0 Complex (extensive) Discharge Coordination PROCESS - Special Needs []  - Pediatric / Minor Patient Management 0 []  - 0 Isolation Patient Management []  - 0 Hearing / Language / Visual special needs []  - 0 Assessment of Community assistance (transportation, D/C planning, etc.) []  - 0 Additional assistance / Altered mentation []  - 0 Support Surface(s) Assessment (bed, cushion, seat,  etc.) INTERVENTIONS - Wound Cleansing / Measurement X - Wound Imaging (photographs - any number of wounds) 1 5 []  - 0 Wound Tracing (instead of photographs) X- 1 5 Simple Wound Measurement - one wound []  - 0 Complex Wound Measurement - multiple wounds NARADA, UZZLE ([id number]) X- 1 5 Simple Wound  Cleansing - one wound []  - 0 Complex Wound Cleansing - multiple wounds INTERVENTIONS - Wound Dressings []  - Small Wound Dressing one or multiple wounds 0 X- 1 15 Medium Wound Dressing one or multiple wounds []  - 0 Large Wound Dressing one or multiple wounds []  - 0 Application of Medications - injection INTERVENTIONS - Miscellaneous []  - External ear exam 0 []  - 0 Specimen Collection (cultures, biopsies, blood, body fluids, etc.) []  - 0 Specimen(s) / Culture(s) sent or taken to Lab for analysis []  - 0 Patient Transfer (multiple staff / Civil Service fast streamer / Similar devices) []  - 0 Simple Staple / Suture removal (25 or less) []  - 0 Complex Staple / Suture removal (26 or more) []  - 0 Hypo / Hyperglycemic Management (close monitor of Blood Glucose) []  - 0 Ankle / Brachial Index (ABI) - do not check if billed separately Has the patient been seen at the hospital within the last three years: Yes Total Score: 130 Level Of Care: New/Established - Level 4 Electronic Signature(s) Signed: 07/26/2021 4:33:01 PM By: Carlene Coria RN Entered By: Carlene Coria on 07/25/2021 09:50:26 Jared [name] ([id number]) -------------------------------------------------------------------------------- Encounter Discharge Information Details Patient Name: Jared [name]. Date of Service: 07/25/2021 8:45 AM Medical Record Number: [id number] Patient Account Number: [id number] Date of Birth/Sex: 01/31/1956 (66 y.o. M) Treating RN: Carlene Coria Primary Care Jameel Quant: [name] Other Clinician: Referring Aigner Horseman: Benjamine Sprague Treating Piya Mesch/Extender: Skipper Cliche in Treatment: 0 Encounter  Discharge Information Items Post Procedure Vitals Discharge Condition: Stable Temperature (F): 98.4 Ambulatory Status: Ambulatory Pulse (bpm): 78 Discharge Destination: Home Respiratory Rate (breaths/min): 18 Transportation: Private Auto Blood Pressure (mmHg): 138/74 Accompanied By: self Schedule Follow-up Appointment: Yes Clinical Summary of Care: Electronic Signature(s) Signed: 07/26/2021 4:33:01 PM By: Carlene Coria RN Entered By: Carlene Coria on 07/25/2021 09:52:51 Jared [name] ([id number]) -------------------------------------------------------------------------------- Lower Extremity Assessment Details Patient Name: Jared [name]. Date of Service: 07/25/2021 8:45 AM Medical Record Number: [id number] Patient Account Number: [id number] Date of Birth/Sex: 04-12-1956 (66 y.o. M) Treating RN: Carlene Coria Primary Care Finnley Larusso: [name] Other Clinician: Referring Airelle Everding: Benjamine Sprague Treating Larra Crunkleton/Extender: Jeri Cos Weeks in Treatment: 0 Electronic Signature(s) Signed: 07/26/2021 4:33:01 PM By: Carlene Coria RN Entered By: Carlene Coria on 07/25/2021 09:19:54 Jared [name] ([id number]) -------------------------------------------------------------------------------- Multi Wound Chart Details Patient Name: Jared [name]. Date of Service: 07/25/2021 8:45 AM Medical Record Number: [id number] Patient Account Number: [id number] Date of Birth/Sex: 1955-11-08 (66 y.o. M) Treating RN: Carlene Coria Primary Care Haaris Metallo: [name] Other Clinician: Referring Alistair Senft: Benjamine Sprague Treating Macalister Arnaud/Extender: Skipper Cliche in Treatment: 0 Vital Signs Height(in): 78 Pulse(bpm): 68 Weight(lbs): 227 Blood Pressure(mmHg): 138/74 Body Mass Index(BMI): 26 Temperature(F): 98.4 Respiratory Rate(breaths/min): 18 Photos: [N/A:N/A] Wound Location: Left Gluteus N/A N/A Wounding Event: Surgical Injury N/A N/A Primary Etiology: Open Surgical Wound N/A  N/A Date Acquired: 05/24/2021 N/A N/A Weeks of Treatment: 0 N/A N/A Wound Status: Open N/A N/A Measurements L x W x D (cm) 0.3x0.5x0.1 N/A N/A Area (cm) : 0.118 N/A N/A Volume (cm) : 0.012 N/A N/A Classification: Full Thickness Without Exposed N/A N/A Support Structures Exudate Amount: Medium N/A N/A Exudate Type: Sanguinous N/A N/A Exudate Color: red N/A N/A Granulation Amount: Large (67-100%) N/A N/A Granulation Quality: Red N/A N/A Necrotic Amount: None Present (0%) N/A N/A Exposed Structures: Fat Layer (Subcutaneous Tissue): N/A N/A Yes Fascia: No Tendon: No Muscle: No Joint: No Bone: No Epithelialization: None N/A N/A Treatment Notes Electronic Signature(s) Signed: 07/26/2021 4:33:01 PM By: Carlene Coria  RN Entered By: Carlene Coria on 07/25/2021 09:34:34 Jared [name] ([id number]) -------------------------------------------------------------------------------- [place] Details Patient Name: BEVERLY, SURIANO. Date of Service: 07/25/2021 8:45 AM Medical Record Number: [id number] Patient Account Number: [id number] Date of Birth/Sex: [birth year] (66 y.o. M) Treating RN: Carlene Coria Primary Care Osborn Pullin: [name] Other Clinician: Referring Shain Pauwels: Benjamine Sprague Treating Bingham Millette/Extender: Skipper Cliche in Treatment: 0 Active Inactive Wound/Skin Impairment Nursing Diagnoses: Knowledge deficit related to ulceration/compromised skin integrity Goals: Patient/caregiver will verbalize understanding of skin care regimen Date Initiated: 07/25/2021 Target Resolution Date: 08/25/2021 Goal Status: Active Ulcer/skin breakdown will have a volume reduction of 30% by week 4 Date Initiated: 07/25/2021 Target Resolution Date: 09/22/2021 Goal Status: Active Ulcer/skin breakdown will have a volume reduction of 50% by week 8 Date Initiated: 07/25/2021 Target Resolution Date: 10/23/2021 Goal Status: Active Ulcer/skin breakdown will have a volume reduction of  80% by week 12 Date Initiated: 07/25/2021 Target Resolution Date: 11/22/2021 Goal Status: Active Ulcer/skin breakdown will heal within 14 weeks Date Initiated: 07/25/2021 Target Resolution Date: 12/23/2021 Goal Status: Active Interventions: Assess patient/caregiver ability to obtain necessary supplies Assess patient/caregiver ability to perform ulcer/skin care regimen upon admission and as needed Assess ulceration(s) every visit Notes: Electronic Signature(s) Signed: 07/26/2021 4:33:01 PM By: Carlene Coria RN Entered By: Carlene Coria on 07/25/2021 09:33:42 Jared [name] ([id number]) -------------------------------------------------------------------------------- Pain Assessment Details Patient Name: Jared [name]. Date of Service: 07/25/2021 8:45 AM Medical Record Number: [id number] Patient Account Number: [id number] Date of Birth/Sex: [birth year] (66 y.o. M) Treating RN: Carlene Coria Primary Care Kenichi Cassada: [name] Other Clinician: Referring Phylis Javed: Benjamine Sprague Treating Itha Kroeker/Extender: Skipper Cliche in Treatment: 0 Active Problems Location of Pain Severity and Description of Pain Patient Has Paino No Site Locations Pain Management and Medication Current Pain Management: Electronic Signature(s) Signed: 07/26/2021 4:33:01 PM By: Carlene Coria RN Entered By: Carlene Coria on 07/25/2021 09:02:09 Jared [name] ([id number]) -------------------------------------------------------------------------------- Patient/Caregiver Education Details Patient Name: Jared [name]. Date of Service: 07/25/2021 8:45 AM Medical Record Number: [id number] Patient Account Number: [id number] Date of Birth/Gender: 11-29-1955 (66 y.o. M) Treating RN: Carlene Coria Primary Care Physician: [name] Other Clinician: Referring Physician: Benjamine Sprague Treating Physician/Extender: Skipper Cliche in Treatment: 0 Education Assessment Education Provided To: Patient Education Topics  Provided Wound/Skin Impairment: Methods: Explain/Verbal Responses: State content correctly Electronic Signature(s) Signed: 07/26/2021 4:33:01 PM By: Carlene Coria RN Entered By: Carlene Coria on 07/25/2021 09:50:59 Jared [name] ([id number]) -------------------------------------------------------------------------------- Wound Assessment Details Patient Name: Jared [name]. Date of Service: 07/25/2021 8:45 AM Medical Record Number: [id number] Patient Account Number: [id number] Date of Birth/Sex: February 26, 1956 (66 y.o. M) Treating RN: Carlene Coria Primary Care Uriyah Raska: [name] Other Clinician: Referring Amberlie Gaillard: Benjamine Sprague Treating Yomar Mejorado/Extender: Skipper Cliche in Treatment: 0 Wound Status Wound Number: 1 Primary Etiology: Open Surgical Wound Wound Location: Left Gluteus Wound Status: Open Wounding Event: Surgical Injury Date Acquired: 05/24/2021 Weeks Of Treatment: 0 Clustered Wound: No Photos Wound Measurements Length: (cm) 0.3 Width: (cm) 0.5 Depth: (cm) 1 Area: (cm) 0.118 Volume: (cm) 0.118 % Reduction in Area: 0% % Reduction in Volume: -883.3% Epithelialization: None Tunneling: No Undermining: No Wound Description Classification: Full Thickness Without Exposed Support Structu Exudate Amount: Medium Exudate Type: Sanguinous Exudate Color: red res Foul Odor After Cleansing: No Slough/Fibrino No Wound Bed Granulation Amount: Large (67-100%) Exposed Structure Granulation Quality: Red Fascia Exposed: No Necrotic Amount: None Present (0%) Fat Layer (Subcutaneous Tissue) Exposed: Yes Tendon Exposed: No Muscle Exposed: No Joint Exposed: No Bone Exposed: No Treatment  Notes Wound #1 (Gluteus) Wound Laterality: Left Cleanser Soap and Water Discharge Instruction: Gently cleanse wound with antibacterial soap, rinse and pat dry prior to dressing wounds Peri-Wound Care DONALD, MEMOLI ([id number]) Topical Primary Dressing hydrofera blue  rope Discharge Instruction: pack into wound Secondary Dressing Zetuvit Plus Silicone Border Dressing 4x4 (in/in) Discharge Instruction: cover wound Secured With Compression Wrap Compression Stockings Add-Ons Electronic Signature(s) Signed: 07/26/2021 4:33:01 PM By: Carlene Coria RN Entered By: Carlene Coria on 07/25/2021 09:46:48 Jared [name] ([id number]) -------------------------------------------------------------------------------- [place] Details Patient Name: Jared [name]. Date of Service: 07/25/2021 8:45 AM Medical Record Number: [id number] Patient Account Number: [id number] Date of Birth/Sex: [birth year] (66 y.o. M) Treating RN: Carlene Coria Primary Care Ambriana Selway: [name] Other Clinician: Referring Bernese Doffing: Benjamine Sprague Treating Sharea Guinther/Extender: Skipper Cliche in Treatment: 0 Vital Signs Time Taken: 09:02 Temperature (F): 98.4 Height (in): 78 Pulse (bpm): 78 Source: Stated Respiratory Rate (breaths/min): 18 Weight (lbs): 227 Blood Pressure (mmHg): 138/74 Source: Stated Reference Range: 80 - 120 mg / dl Body Mass Index (BMI): 26.2 Electronic Signature(s) Signed: 07/26/2021 4:33:01 PM By: Carlene Coria RN Entered By: Carlene Coria on 07/25/2021 09:03:11

## 2021-08-01 ENCOUNTER — Other Ambulatory Visit: Payer: Self-pay

## 2021-08-01 ENCOUNTER — Encounter: Payer: BC Managed Care – PPO | Admitting: Physician Assistant

## 2021-08-01 DIAGNOSIS — L0231 Cutaneous abscess of buttock: Secondary | ICD-10-CM | POA: Diagnosis not present

## 2021-08-01 NOTE — Progress Notes (Addendum)
Jared [name] Jared [name] [name], Jared [name] Jared [name] [name] ([id number]) Visit Report for 08/01/2021 Chief Complaint Document Details Patient Name: Jared [name], Jared [name] [name]. Date of Service: 08/01/2021 11:00 AM Medical Record Number: [id number] Patient Account Number: [id number] Date of Birth/Sex: 08-03-1955 (66 y.o. M) Treating RN: Carlene Coria Primary Care Provider: [name] Other Clinician: Referring Provider: [name] Treating Provider/Extender: Skipper Cliche in Treatment: 1 Information Obtained from: Patient Chief Complaint Left gluteal abscess s/p IandD 05/24/21 Electronic Signature(s) Signed: 08/01/2021 11:30:10 AM By: Worthy Keeler PA-C Entered By: Worthy Keeler on 08/01/2021 11:30:10 Jared [name] Jared [name] [name] ([id number]) -------------------------------------------------------------------------------- HPI Details Patient Name: Jared [name] Jared [name] [name] Date of Service: 08/01/2021 11:00 AM Medical Record Number: [id number] Patient Account Number: [id number] Date of Birth/Sex: 05/19/1956 (66 y.o. M) Treating RN: Carlene Coria Primary Care Provider: [name] Other Clinician: Referring Provider: [name] Treating Provider/Extender: Skipper Cliche in Treatment: 1 History of Present Illness HPI Description: 07/25/2021 patient presents today for initial evaluation here in the clinic concerning issues that he has been having with a abscess initially which was on the left gluteal region more lateral. This was actually back at the beginning of November 2022. Subsequently he had an IandD on 05/24/2021. This did initially clean everything out but he has been having issues with it since continuing to drain quite significantly at times. Fortunately he does not have any significant pain he tells me at this point which is great news there is some bleeding he does seem to be very friable and side probably due to the fact that is a lot of fluid and hypergranulation even internally. 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 Not even certain what exactly that occurred. Currently he has been using gauze to pack into the area I think he probably would do better with something like a Hydrofera Blue rope. 08/01/2020 upon evaluation today patient appears to be doing well with regard to his wound in the gluteal region. This is actually showing signs of excellent improvement which is great news and overall I am extremely pleased with where we stand at this time. In general I think that he is making great progress. Electronic Signature(s) Signed: 08/01/2021 1:13:58 PM By: Worthy Keeler PA-C Entered By: Worthy Keeler on 08/01/2021 13:13:57 Jared [name] Jared [name] [name] Jared [name] [name] ([id number]) -------------------------------------------------------------------------------- Physical Exam Details Patient Name: Jared [name], Jared [name] [name]. Date of Service: 08/01/2021 11:00 AM Medical Record Number: [id number] Patient Account Number: [id number] Date of Birth/Sex: 1955/10/11 (66 y.o. M) Treating RN: Carlene Coria Primary Care Provider: [name] Other Clinician: Referring Provider: [name] Treating Provider/Extender: Skipper Cliche in Treatment: 1 Constitutional Well-nourished and well-hydrated in no acute distress. Respiratory normal breathing without difficulty. Psychiatric this patient is able to make decisions and demonstrates good insight into disease process. Alert and Oriented x 3. pleasant and cooperative. Notes Patient's wound bed actually showed signs of good granulation epithelization at this point the depth of the tunnel is actually much less than what it was the deepest I got was around 1.5 cm. Overall I think this is doing great. Electronic Signature(s) Signed: 08/01/2021 1:15:07 PM By: Worthy Keeler PA-C Entered By: Worthy Keeler on 08/01/2021  13:15:07 Jared [name] Jared [name] [name] ([id number]) -------------------------------------------------------------------------------- Physician Orders Details Patient Name: Jared [name], Jared [name] [name]. Date of Service: 08/01/2021 11:00 AM Medical Record Number: [id number] Patient Account Number: [id number] Date of Birth/Sex: 03-20-1956 (66 y.o. M)  Treating RN: Carlene Coria Primary Care Provider: [name] Other Clinician: Referring Provider: [name] Treating Provider/Extender: Skipper Cliche in Treatment: 1 Verbal / Phone Orders: No Diagnosis Coding ICD-10 Coding Code Description L02.31 Cutaneous abscess of buttock T81.31XA Disruption of external operation (surgical) wound, not elsewhere classified, initial encounter L98.412 Non-pressure chronic ulcer of buttock with fat layer exposed Follow-up Appointments o Return Appointment in 1 week. Bathing/ Shower/ Hygiene o May shower; gently cleanse wound with antibacterial soap, rinse and pat dry prior to dressing wounds Wound Treatment Wound #1 - Gluteus Wound Laterality: Left Cleanser: Soap and Water 1 x Per Day/30 Days Discharge Instructions: Gently cleanse wound with antibacterial soap, rinse and pat dry prior to dressing wounds Primary Dressing: hydrofera blue rope 1 x Per Day/30 Days Discharge Instructions: pack into wound Secondary Dressing: Zetuvit Plus Silicone Border Dressing 4x4 (in/in) (Generic) 1 x Per Day/30 Days Discharge Instructions: cover wound Electronic Signature(s) Signed: 08/01/2021 11:32:02 AM By: Carlene Coria RN Signed: 08/02/2021 9:05:45 AM By: Worthy Keeler PA-C Entered By: Carlene Coria on 08/01/2021 11:32:02 Jared [name] Jared [name] [name] ([id number]) -------------------------------------------------------------------------------- Problem List Details Patient Name: Jared [name] Jared [name] [name]. Date of Service: 08/01/2021 11:00 AM Medical Record Number: [id number] Patient Account Number: [id number] Date of Birth/Sex: 12/08/1955 (66 y.o.  M) Treating RN: Carlene Coria Primary Care Provider: [name] Other Clinician: Referring Provider: [name] Treating Provider/Extender: Skipper Cliche in Treatment: 1 Active Problems ICD-10 Encounter Code Description Active Date MDM Diagnosis L02.31 Cutaneous abscess of buttock 07/25/2021 No Yes T81.31XA Disruption of external operation (surgical) wound, not elsewhere 07/25/2021 No Yes classified, initial encounter L98.412 Non-pressure chronic ulcer of buttock with fat layer exposed 07/25/2021 No Yes Inactive Problems Resolved Problems Electronic Signature(s) Signed: 08/01/2021 11:30:04 AM By: Worthy Keeler PA-C Entered By: Worthy Keeler on 08/01/2021 11:30:04 Jared [name] Jared [name] [name] ([id number]) -------------------------------------------------------------------------------- Progress Note Details Patient Name: Jared [name] Jared [name] [name]. Date of Service: 08/01/2021 11:00 AM Medical Record Number: [id number] Patient Account Number: [id number] Date of Birth/Sex: [birth year] (66 y.o. M) Treating RN: Carlene Coria Primary Care Provider: [name] Other Clinician: Referring Provider: [name] Treating Provider/Extender: Skipper Cliche in Treatment: 1 Subjective Chief Complaint Information obtained from Patient Left gluteal abscess s/p IandD 05/24/21 History of Present Illness (HPI) 07/25/2021 patient presents today for initial evaluation here in the clinic concerning issues that he has been having with a abscess initially which was on the left gluteal region more lateral. This was actually back at the beginning of November 2022. Subsequently he had an IandD on 05/24/2021. This did initially clean everything out but he has been having issues with it since continuing to drain quite significantly at times. Fortunately he does not have any significant pain he tells me at this point which is great news there is some bleeding he does seem to be very friable and side probably  due to the fact that is a lot of fluid and hypergranulation even internally. 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 Not even certain what exactly that occurred. Currently he has been using gauze to pack into the area I think he probably would do better with something like a Hydrofera Blue rope. 08/01/2020 upon evaluation today patient appears to be doing well with regard to his wound in the gluteal region. This is actually showing signs of excellent improvement which is great news  and overall I am extremely pleased with where we stand at this time. In general I think that he is making great progress. Objective Constitutional Well-nourished and well-hydrated in no acute distress. Vitals Time Taken: 11:16 AM, Height: 78 in, Weight: 227 lbs, BMI: 26.2, Temperature: 98.2 F, Pulse: 53 bpm, Respiratory Rate: 18 breaths/min, Blood Pressure: 136/66 mmHg. Respiratory normal breathing without difficulty. Psychiatric this patient is able to make decisions and demonstrates good insight into disease process. Alert and Oriented x 3. pleasant and cooperative. General Notes: Patient's wound bed actually showed signs of good granulation epithelization at this point the depth of the tunnel is actually much less than what it was the deepest I got was around 1.5 cm. Overall I think this is doing great. Integumentary (Hair, Skin) Wound #1 status is Open. Original cause of wound was Surgical Injury. The date acquired was: 05/24/2021. The wound has been in treatment 1 weeks. The wound is located on the Left Gluteus. The wound measures 0.3cm length x 0.5cm width x 0.1cm depth; 0.118cm^2 area and 0.012cm^3 volume. There is Fat Layer (Subcutaneous Tissue) exposed. There is no tunneling or undermining noted. There is a medium amount of serosanguineous drainage noted.  There is large (67-100%) red granulation within the wound bed. There is no necrotic tissue within the wound bed. Assessment Active Problems ICD-10 Cutaneous abscess of buttock Disruption of external operation (surgical) wound, not elsewhere classified, initial encounter Jared [name] Jared [name] [name], Jared [name] Jared [name] [name] ([id number]) Non-pressure chronic ulcer of buttock with fat layer exposed Plan Follow-up Appointments: Return Appointment in 1 week. Bathing/ Shower/ Hygiene: May shower; gently cleanse wound with antibacterial soap, rinse and pat dry prior to dressing wounds WOUND #1: - Gluteus Wound Laterality: Left Cleanser: Soap and Water 1 x Per Day/30 Days Discharge Instructions: Gently cleanse wound with antibacterial soap, rinse and pat dry prior to dressing wounds Primary Dressing: hydrofera blue rope 1 x Per Day/30 Days Discharge Instructions: pack into wound Secondary Dressing: Zetuvit Plus Silicone Border Dressing 4x4 (in/in) (Generic) 1 x Per Day/30 Days Discharge Instructions: cover wound 1. Would recommend currently that we going to continue with the wound care measures as before and the patient is in agreement with plan. This includes the use of the Hydrofera Blue rope which I think is doing an awesome job. Were to also get a go ahead and continue with the Zetuvit border foam dressing to cover which I think is also doing awesome for him. We will see patient back for reevaluation in 1 week here in the clinic. If anything worsens or changes patient will contact our office for additional recommendations. Electronic Signature(s) Signed: 08/01/2021 1:15:27 PM By: Worthy Keeler PA-C Entered By: Worthy Keeler on 08/01/2021 13:15:27 Jared [name] Jared [name] [name], Jared [name] Jared [name] [name] ([id number]) -------------------------------------------------------------------------------- SuperBill Details Patient Name: Jared [name] Jared [name] [name]. Date of Service: 08/01/2021 Medical Record Number: [id number] Patient Account Number: [id number] Date of  Birth/Sex: 01-18-1956 (66 y.o. M) Treating RN: Carlene Coria Primary Care Provider: [name] Other Clinician: Referring Provider: [name] Treating Provider/Extender: Skipper Cliche in Treatment: 1 Diagnosis Coding ICD-10 Codes Code Description L02.31 Cutaneous abscess of buttock T81.31XA Disruption of external operation (surgical) wound, not elsewhere classified, initial encounter L98.412 Non-pressure chronic ulcer of buttock with fat layer exposed Facility Procedures CPT4 Code: 53299242 Description: [id number] - WOUND CARE VISIT-LEV 2 EST PT Modifier: Quantity: 1 Physician Procedures CPT4 Code: 9622297 Description: 98921 - WC PHYS LEVEL 3 - EST PT Modifier: Quantity: 1 CPT4 Code: Description: ICD-10 Diagnosis Description L02.31 Cutaneous abscess of buttock T81.31XA Disruption of  external operation (surgical) wound, not elsewhere classifi L98.412 Non-pressure chronic ulcer of buttock with fat layer exposed Modifier: ed, initial encounter Quantity: Electronic Signature(s) Signed: 08/01/2021 1:15:42 PM By: Worthy Keeler PA-C Previous Signature: 08/01/2021 11:33:14 AM Version By: Carlene Coria RN Entered By: Worthy Keeler on 08/01/2021 13:15:42

## 2021-08-02 NOTE — Progress Notes (Signed)
Jared [name], Jared [name] ([id number]) Visit Report for 08/01/2021 Arrival Information Details Patient Name: Jared [name], Jared [name]. Date of Service: 08/01/2021 11:00 AM Medical Record Number: [id number] Patient Account Number: [id number] Date of Birth/Sex: 04/26/1956 (66 y.o. M) Treating RN: Carlene Coria Primary Care Shannelle Alguire: [name] Other Clinician: Referring Richele Strand: [name] Treating Mystie Ormand/Extender: Skipper Cliche in Treatment: 1 Visit Information History Since Last Visit All ordered tests and consults were completed: No Patient Arrived: Ambulatory Added or deleted any medications: No Arrival Time: 11:14 Any new allergies or adverse reactions: No Accompanied By: self Had a fall or experienced change in No Transfer Assistance: None activities of daily living that may affect Patient Identification Verified: Yes risk of falls: Secondary Verification Process Completed: Yes Signs or symptoms of abuse/neglect since last visito No Patient Requires Transmission-Based Precautions: No Hospitalized since last visit: No Patient Has Alerts: No Implantable device outside of the clinic excluding No cellular tissue based products placed in the center since last visit: Has Dressing in Place as Prescribed: Yes Pain Present Now: No Electronic Signature(s) Signed: 08/02/2021 4:26:12 PM By: Carlene Coria RN Entered By: Carlene Coria on 08/01/2021 11:16:44 Jared [name] ([id number]) -------------------------------------------------------------------------------- Clinic Level of Care Assessment Details Patient Name: Jared [name]. Date of Service: 08/01/2021 11:00 AM Medical Record Number: [id number] Patient Account Number: [id number] Date of Birth/Sex: [birth year] (66 y.o. M) Treating RN: Carlene Coria Primary Care Kaysea Raya: [name] Other Clinician: Referring Keigen Caddell: [name] Treating Ismahan Lippman/Extender: Skipper Cliche in Treatment: 1 Clinic Level of Care  Assessment Items TOOL 4 Quantity Score X - Use when only an EandM is performed on FOLLOW-UP visit 1 0 ASSESSMENTS - Nursing Assessment / Reassessment X - Reassessment of Co-morbidities (includes updates in patient status) 1 10 X- 1 5 Reassessment of Adherence to Treatment Plan ASSESSMENTS - Wound and Skin Assessment / Reassessment X - Simple Wound Assessment / Reassessment - one wound 1 5 []  - 0 Complex Wound Assessment / Reassessment - multiple wounds []  - 0 Dermatologic / Skin Assessment (not related to wound area) ASSESSMENTS - Focused Assessment []  - Circumferential Edema Measurements - multi extremities 0 []  - 0 Nutritional Assessment / Counseling / Intervention []  - 0 Lower Extremity Assessment (monofilament, tuning fork, pulses) []  - 0 Peripheral Arterial Disease Assessment (using hand held doppler) ASSESSMENTS - Ostomy and/or Continence Assessment and Care []  - Incontinence Assessment and Management 0 []  - 0 Ostomy Care Assessment and Management (repouching, etc.) PROCESS - Coordination of Care X - Simple Patient / Family Education for ongoing care 1 15 []  - 0 Complex (extensive) Patient / Family Education for ongoing care []  - 0 Staff obtains Programmer, systems, Records, Test Results / Process Orders []  - 0 Staff telephones HHA, Nursing Homes / Clarify orders / etc []  - 0 Routine Transfer to another Facility (non-emergent condition) []  - 0 Routine Hospital Admission (non-emergent condition) []  - 0 New Admissions / Biomedical engineer / Ordering NPWT, Apligraf, etc. []  - 0 Emergency Hospital Admission (emergent condition) X- 1 10 Simple Discharge Coordination []  - 0 Complex (extensive) Discharge Coordination PROCESS - Special Needs []  - Pediatric / Minor Patient Management 0 []  - 0 Isolation Patient Management []  - 0 Hearing / Language / Visual special needs []  - 0 Assessment of Community assistance (transportation, D/C planning, etc.) []  - 0 Additional  assistance / Altered mentation []  - 0 Support Surface(s) Assessment (bed, cushion, seat, etc.) INTERVENTIONS - Wound Cleansing / Measurement Jared [name], Jared [name] ([id number]) X- 1 5 Simple Wound Cleansing -  one wound []  - 0 Complex Wound Cleansing - multiple wounds X- 1 5 Wound Imaging (photographs - any number of wounds) []  - 0 Wound Tracing (instead of photographs) X- 1 5 Simple Wound Measurement - one wound []  - 0 Complex Wound Measurement - multiple wounds INTERVENTIONS - Wound Dressings X - Small Wound Dressing one or multiple wounds 1 10 []  - 0 Medium Wound Dressing one or multiple wounds []  - 0 Large Wound Dressing one or multiple wounds []  - 0 Application of Medications - topical []  - 0 Application of Medications - injection INTERVENTIONS - Miscellaneous []  - External ear exam 0 []  - 0 Specimen Collection (cultures, biopsies, blood, body fluids, etc.) []  - 0 Specimen(s) / Culture(s) sent or taken to Lab for analysis []  - 0 Patient Transfer (multiple staff / Civil Service fast streamer / Similar devices) []  - 0 Simple Staple / Suture removal (25 or less) []  - 0 Complex Staple / Suture removal (26 or more) []  - 0 Hypo / Hyperglycemic Management (close monitor of Blood Glucose) []  - 0 Ankle / Brachial Index (ABI) - do not check if billed separately X- 1 5 Vital Signs Has the patient been seen at the hospital within the last three years: Yes Total Score: 75 Level Of Care: New/Established - Level 2 Electronic Signature(s) Signed: 08/02/2021 4:26:12 PM By: Carlene Coria RN Entered By: Carlene Coria on 08/01/2021 11:33:07 Jared [name] ([id number]) -------------------------------------------------------------------------------- Encounter Discharge Information Details Patient Name: Jared [name]. Date of Service: 08/01/2021 11:00 AM Medical Record Number: [id number] Patient Account Number: [id number] Date of Birth/Sex: 01-31-1956 (66 y.o. M) Treating RN: Carlene Coria Primary  Care Amantha Sklar: [name] Other Clinician: Referring Henry Demeritt: [name] Treating Tayten Heber/Extender: Skipper Cliche in Treatment: 1 Encounter Discharge Information Items Discharge Condition: Stable Ambulatory Status: Ambulatory Discharge Destination: Home Transportation: Private Auto Accompanied By: self Schedule Follow-up Appointment: Yes Clinical Summary of Care: Patient Declined Electronic Signature(s) Signed: 08/02/2021 4:26:12 PM By: Carlene Coria RN Entered By: Carlene Coria on 08/01/2021 11:44:24 Jared [name] ([id number]) -------------------------------------------------------------------------------- Lower Extremity Assessment Details Patient Name: Jared [name]. Date of Service: 08/01/2021 11:00 AM Medical Record Number: [id number] Patient Account Number: [id number] Date of Birth/Sex: 1956/02/28 (66 y.o. M) Treating RN: Carlene Coria Primary Care Maeva Dant: [name] Other Clinician: Referring Makel Mcmann: [name] Treating Ahmon Tosi/Extender: Skipper Cliche in Treatment: 1 Electronic Signature(s) Signed: 08/01/2021 11:31:13 AM By: Carlene Coria RN Entered By: Carlene Coria on 08/01/2021 11:31:13 Jared [name] ([id number]) -------------------------------------------------------------------------------- Multi Wound Chart Details Patient Name: Jared [name], Jared [name]. Date of Service: 08/01/2021 11:00 AM Medical Record Number: [id number] Patient Account Number: [id number] Date of Birth/Sex: October 19, 1955 (66 y.o. M) Treating RN: Carlene Coria Primary Care Franklin Clapsaddle: [name] Other Clinician: Referring Nichalos Brenton: [name] Treating Ethne Jeon/Extender: Skipper Cliche in Treatment: 1 Vital Signs Height(in): 78 Pulse(bpm): 62 Weight(lbs): 227 Blood Pressure(mmHg): 136/66 Body Mass Index(BMI): 26 Temperature(F): 98.2 Respiratory Rate(breaths/min): 18 Photos: [1:No Photos] [N/A:N/A] Wound Location: [1:Left Gluteus]  [N/A:N/A] Wounding Event: [1:Surgical Injury] [N/A:N/A] Primary Etiology: [1:Open Surgical Wound] [N/A:N/A] Date Acquired: [1:05/24/2021] [N/A:N/A] Weeks of Treatment: [1:1] [N/A:N/A] Wound Status: [1:Open] [N/A:N/A] Measurements L x W x D (cm) [1:0.3x0.5x0.1] [N/A:N/A] Area (cm) : [1:0.118] [N/A:N/A] Volume (cm) : [1:0.012] [N/A:N/A] % Reduction in Area: [1:0.00%] [N/A:N/A] % Reduction in Volume: [1:89.80%] [N/A:N/A] Classification: [1:Full Thickness Without Exposed Support Structures] [N/A:N/A] Exudate Amount: [1:Medium] [N/A:N/A] Exudate Type: [1:Sanguinous red] [N/A:N/A N/A] Treatment Notes Electronic Signature(s) Signed: 08/01/2021 11:31:36 AM By: Carlene Coria RN Entered By: Carlene Coria on 08/01/2021 11:31:36  Jared [name], Jared [name] ([id number]) -------------------------------------------------------------------------------- [place] Details Patient Name: Jared [name], Jared [name]. Date of Service: 08/01/2021 11:00 AM Medical Record Number: [id number] Patient Account Number: [id number] Date of Birth/Sex: 07-Jul-1956 (66 y.o. M) Treating RN: Carlene Coria Primary Care Joeph Szatkowski: [name] Other Clinician: Referring Wenceslao Loper: [name] Treating Joley Utecht/Extender: Skipper Cliche in Treatment: 1 Active Inactive Wound/Skin Impairment Nursing Diagnoses: Knowledge deficit related to ulceration/compromised skin integrity Goals: Patient/caregiver will verbalize understanding of skin care regimen Date Initiated: 07/25/2021 Target Resolution Date: 08/25/2021 Goal Status: Active Ulcer/skin breakdown will have a volume reduction of 30% by week 4 Date Initiated: 07/25/2021 Target Resolution Date: 09/22/2021 Goal Status: Active Ulcer/skin breakdown will have a volume reduction of 50% by week 8 Date Initiated: 07/25/2021 Target Resolution Date: 10/23/2021 Goal Status: Active Ulcer/skin breakdown will have a volume reduction of 80% by week 12 Date Initiated: 07/25/2021 Target  Resolution Date: 11/22/2021 Goal Status: Active Ulcer/skin breakdown will heal within 14 weeks Date Initiated: 07/25/2021 Target Resolution Date: 12/23/2021 Goal Status: Active Interventions: Assess patient/caregiver ability to obtain necessary supplies Assess patient/caregiver ability to perform ulcer/skin care regimen upon admission and as needed Assess ulceration(s) every visit Notes: Electronic Signature(s) Signed: 08/01/2021 11:31:21 AM By: Carlene Coria RN Entered By: Carlene Coria on 08/01/2021 11:31:21 Jared [name] ([id number]) -------------------------------------------------------------------------------- Pain Assessment Details Patient Name: Jared [name]. Date of Service: 08/01/2021 11:00 AM Medical Record Number: [id number] Patient Account Number: [id number] Date of Birth/Sex: Sep 10, 1955 (66 y.o. M) Treating RN: Carlene Coria Primary Care Sally Reimers: [name] Other Clinician: Referring Shauntell Iglesia: [name] Treating Sruthi Maurer/Extender: Skipper Cliche in Treatment: 1 Active Problems Location of Pain Severity and Description of Pain Patient Has Paino No Site Locations Pain Management and Medication Current Pain Management: Electronic Signature(s) Signed: 08/02/2021 4:26:12 PM By: Carlene Coria RN Entered By: Carlene Coria on 08/01/2021 11:17:54 Jared [name] ([id number]) -------------------------------------------------------------------------------- Patient/Caregiver Education Details Patient Name: Jared [name]. Date of Service: 08/01/2021 11:00 AM Medical Record Number: [id number] Patient Account Number: [id number] Date of Birth/Gender: 03-03-1956 (66 y.o. M) Treating RN: Carlene Coria Primary Care Physician: [name] Other Clinician: Referring Physician: [name] Treating Physician/Extender: Skipper Cliche in Treatment: 1 Education Assessment Education Provided To: Patient Education Topics Provided Wound/Skin  Impairment: Methods: Explain/Verbal Responses: State content correctly Electronic Signature(s) Signed: 08/02/2021 4:26:12 PM By: Carlene Coria RN Entered By: Carlene Coria on 08/01/2021 11:33:43 Jared [name] ([id number]) -------------------------------------------------------------------------------- Wound Assessment Details Patient Name: Jared [name]. Date of Service: 08/01/2021 11:00 AM Medical Record Number: [id number] Patient Account Number: [id number] Date of Birth/Sex: September 13, 1955 (66 y.o. M) Treating RN: Carlene Coria Primary Care Precious Gilchrest: [name] Other Clinician: Referring Jadarion Halbig: [name] Treating Magdala Brahmbhatt/Extender: Skipper Cliche in Treatment: 1 Wound Status Wound Number: 1 Primary Etiology: Open Surgical Wound Wound Location: Left Gluteus Wound Status: Open Wounding Event: Surgical Injury Date Acquired: 05/24/2021 Weeks Of Treatment: 1 Clustered Wound: No Photos Wound Measurements Length: (cm) 0.3 Width: (cm) 0.5 Depth: (cm) 0.1 Area: (cm) 0.118 Volume: (cm) 0.012 % Reduction in Area: 0% % Reduction in Volume: 89.8% Epithelialization: None Tunneling: No Undermining: No Wound Description Classification: Full Thickness Without Exposed Support Structu Exudate Amount: Medium Exudate Type: Serosanguineous Exudate Color: red, brown res Foul Odor After Cleansing: No Slough/Fibrino No Wound Bed Granulation Amount: Large (67-100%) Exposed Structure Granulation Quality: Red Fascia Exposed: No Necrotic Amount: None Present (0%) Fat Layer (Subcutaneous Tissue) Exposed: Yes Tendon Exposed: No Muscle Exposed: No Joint Exposed: No Bone Exposed: No Treatment Notes Wound #1 (Gluteus) Wound Laterality: Left  Cleanser Soap and Water Discharge Instruction: Gently cleanse wound with antibacterial soap, rinse and pat dry prior to dressing wounds Peri-Wound Care Jared [name], Jared [name] ([id number]) Topical Primary Dressing hydrofera blue  rope Discharge Instruction: pack into wound Secondary Dressing Zetuvit Plus Silicone Border Dressing 4x4 (in/in) Discharge Instruction: cover wound Secured With Compression Wrap Compression Stockings Add-Ons Electronic Signature(s) Signed: 08/02/2021 4:26:12 PM By: Carlene Coria RN Entered By: Carlene Coria on 08/01/2021 11:42:33 Jared [name] ([id number]) -------------------------------------------------------------------------------- [place] Details Patient Name: Jared [name]. Date of Service: 08/01/2021 11:00 AM Medical Record Number: [id number] Patient Account Number: [id number] Date of Birth/Sex: 1956/07/21 (66 y.o. M) Treating RN: Carlene Coria Primary Care Laquida Cotrell: [name] Other Clinician: Referring Kato Wieczorek: [name] Treating Keontre Defino/Extender: Skipper Cliche in Treatment: 1 Vital Signs Time Taken: 11:16 Temperature (F): 98.2 Height (in): 78 Pulse (bpm): 53 Weight (lbs): 227 Respiratory Rate (breaths/min): 18 Body Mass Index (BMI): 26.2 Blood Pressure (mmHg): 136/66 Reference Range: 80 - 120 mg / dl Electronic Signature(s) Signed: 08/02/2021 4:26:12 PM By: Carlene Coria RN Entered By: Carlene Coria on 08/01/2021 11:17:43

## 2021-08-08 ENCOUNTER — Ambulatory Visit: Payer: BC Managed Care – PPO
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
## 2021-08-15 ENCOUNTER — Encounter: Payer: BC Managed Care – PPO | Admitting: Physician Assistant

## 2021-08-15 ENCOUNTER — Other Ambulatory Visit: Payer: Self-pay

## 2021-08-15 DIAGNOSIS — L0231 Cutaneous abscess of buttock: Secondary | ICD-10-CM | POA: Diagnosis not present

## 2021-08-15 NOTE — Progress Notes (Addendum)
Jared, [name] ([id number]) Visit Report for 08/15/2021 Chief Complaint Document Details Patient Name: Jared [name], Jared [name]. Date of Service: 08/15/2021 1:15 PM Medical Record Number: [id number] Patient Account Number: [id number] Date of Birth/Sex: 1956/04/25 (66 y.o. M) Treating RN: Carlene Coria Primary Care Provider: [name] Other Clinician: Referring Provider: [name] Treating Provider/Extender: Skipper Cliche in Treatment: 3 Information Obtained from: Patient Chief Complaint Left gluteal abscess s/p IandD 05/24/21 Electronic Signature(s) Signed: 08/15/2021 2:03:06 PM By: Worthy Keeler PA-C Entered By: Worthy Keeler on 08/15/2021 14:03:06 Jared [name] ([id number]) -------------------------------------------------------------------------------- HPI Details Patient Name: Jared [name] Date of Service: 08/15/2021 1:15 PM Medical Record Number: [id number] Patient Account Number: [id number] Date of Birth/Sex: Jan 20, 1956 (66 y.o. M) Treating RN: Carlene Coria Primary Care Provider: [name] Other Clinician: Referring Provider: [name] Treating Provider/Extender: Skipper Cliche in Treatment: 3 History of Present Illness HPI Description: 07/25/2021 patient presents today for initial evaluation here in the clinic concerning issues that he has been having with a abscess initially which was on the left gluteal region more lateral. This was actually back at the beginning of November 2022. Subsequently he had an IandD on 05/24/2021. This did initially clean everything out but he has been having issues with it since continuing to drain quite significantly at times. Fortunately he does not have any significant pain he tells me at this point which is great news there is some bleeding he does seem to be very friable and side probably due to the fact that is a lot of fluid and hypergranulation even internally. Nonetheless he otherwise is a fairly healthy  individual without any major medical problems other than having had a TIA due to PVCs in the past but nothing else has occurred as a result of that. Not even certain what exactly that occurred. Currently he has been using gauze to pack into the area I think he probably would do better with something like a Hydrofera Blue rope. 08/01/2020 upon evaluation today patient appears to be doing well with regard to his wound in the gluteal region. This is actually showing signs of excellent improvement which is great news and overall I am extremely pleased with where we stand at this time. In general I think that he is making great progress. 08/15/2020 upon evaluation today patient appears to be doing extremely well in regard to the wound in the gluteal region. In fact he appears to be completely healed today which is also news. Electronic Signature(s) Signed: 08/15/2021 6:02:32 PM By: Worthy Keeler PA-C Entered By: Worthy Keeler on 08/15/2021 18:02:32 [name], [name] ([id number]) -------------------------------------------------------------------------------- Physical Exam Details Patient Name: Jared, [name]. Date of Service: 08/15/2021 1:15 PM Medical Record Number: [id number] Patient Account Number: [id number] Date of Birth/Sex: 1955-12-19 (66 y.o. M) Treating RN: Carlene Coria Primary Care Provider: [name] Other Clinician: Referring Provider: [name] Treating Provider/Extender: Skipper Cliche in Treatment: 3 Constitutional Well-nourished and well-hydrated in no acute distress. Respiratory normal breathing without difficulty. Psychiatric this patient is able to make decisions and demonstrates good insight into disease process. Alert and Oriented x 3. pleasant and cooperative. Notes Upon inspection patient's wound bed actually showed signs of good granulation and epithelization at this point. Fortunately there does not appear to be any evidence of active infection  locally nor systemically which is excellent news as well. Electronic Signature(s) Signed: 08/15/2021 6:02:45 PM By: Worthy Keeler PA-C Entered By: Worthy Keeler on 08/15/2021 18:02:45 Jared [name] ([id number]) --------------------------------------------------------------------------------  Physician Orders Details Patient Name: Jared, [name]. Date of Service: 08/15/2021 1:15 PM Medical Record Number: [id number] Patient Account Number: [id number] Date of Birth/Sex: July 08, 1956 (66 y.o. M) Treating RN: Carlene Coria Primary Care Provider: [name] Other Clinician: Referring Provider: [name] Treating Provider/Extender: Skipper Cliche in Treatment: 3 Verbal / Phone Orders: No Diagnosis Coding ICD-10 Coding Code Description L02.31 Cutaneous abscess of buttock T81.31XA Disruption of external operation (surgical) wound, not elsewhere classified, initial encounter L98.412 Non-pressure chronic ulcer of buttock with fat layer exposed Discharge From [facility] Services o Discharge from [place] Treatment Complete Electronic Signature(s) Signed: 08/15/2021 6:09:14 PM By: Worthy Keeler PA-C Signed: 08/16/2021 2:31:10 PM By: Carlene Coria RN Entered By: Carlene Coria on 08/15/2021 14:23:38 Jared [name] ([id number]) -------------------------------------------------------------------------------- Problem List Details Patient Name: Jared [name]. Date of Service: 08/15/2021 1:15 PM Medical Record Number: [id number] Patient Account Number: [id number] Date of Birth/Sex: 04/23/1956 (66 y.o. M) Treating RN: Carlene Coria Primary Care Provider: [name] Other Clinician: Referring Provider: [name] Treating Provider/Extender: Skipper Cliche in Treatment: 3 Active Problems ICD-10 Encounter Code Description Active Date MDM Diagnosis L02.31 Cutaneous abscess of buttock 07/25/2021 No Yes T81.31XA Disruption of external operation (surgical) wound,  not elsewhere 07/25/2021 No Yes classified, initial encounter L98.412 Non-pressure chronic ulcer of buttock with fat layer exposed 07/25/2021 No Yes Inactive Problems Resolved Problems Electronic Signature(s) Signed: 08/15/2021 2:03:01 PM By: Worthy Keeler PA-C Entered By: Worthy Keeler on 08/15/2021 14:03:00 Jared [name] ([id number]) -------------------------------------------------------------------------------- Progress Note Details Patient Name: Jared [name]. Date of Service: 08/15/2021 1:15 PM Medical Record Number: [id number] Patient Account Number: [id number] Date of Birth/Sex: [birth year] (66 y.o. M) Treating RN: Carlene Coria Primary Care Provider: [name] Other Clinician: Referring Provider: [name] Treating Provider/Extender: Skipper Cliche in Treatment: 3 Subjective Chief Complaint Information obtained from Patient Left gluteal abscess s/p IandD 05/24/21 History of Present Illness (HPI) 07/25/2021 patient presents today for initial evaluation here in the clinic concerning issues that he has been having with a abscess initially which was on the left gluteal region more lateral. This was actually back at the beginning of November 2022. Subsequently he had an IandD on 05/24/2021. This did initially clean everything out but he has been having issues with it since continuing to drain quite significantly at times. Fortunately he does not have any significant pain he tells me at this point which is great news there is some bleeding he does seem to be very friable and side probably due to the fact that is a lot of fluid and hypergranulation even internally. 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 Not even certain what exactly that occurred. Currently he has been using gauze to pack into the area I think he  probably would do better with something like a Hydrofera Blue rope. 08/01/2020 upon evaluation today patient appears to be doing well with regard to his wound in the gluteal region. This is actually showing signs of excellent improvement which is great news and overall I am extremely pleased with where we stand at this time. In general I think that he is making great progress. 08/15/2020 upon evaluation today patient appears to be doing extremely well in regard to the wound in the gluteal region. In fact he appears to be completely healed today which is also  news. Objective Constitutional Well-nourished and well-hydrated in no acute distress. Vitals Time Taken: 1:27 PM, Height: 78 in, Weight: 227 lbs, BMI: 26.2, Temperature: 98.3 F, Pulse: 61 bpm, Respiratory Rate: 20 breaths/min, Blood Pressure: 136/74 mmHg. Respiratory normal breathing without difficulty. Psychiatric this patient is able to make decisions and demonstrates good insight into disease process. Alert and Oriented x 3. pleasant and cooperative. General Notes: Upon inspection patient's wound bed actually showed signs of good granulation and epithelization at this point. Fortunately there does not appear to be any evidence of active infection locally nor systemically which is excellent news as well. Integumentary (Hair, Skin) Wound #1 status is Open. Original cause of wound was Surgical Injury. The date acquired was: 05/24/2021. The wound has been in treatment 3 weeks. The wound is located on the Left Gluteus. The wound measures 0cm length x 0cm width x 0cm depth; 0cm^2 area and 0cm^3 volume. There is Fat Layer (Subcutaneous Tissue) exposed. There is no tunneling or undermining noted. There is a medium amount of serosanguineous drainage noted. There is large (67-100%) red granulation within the wound bed. There is no necrotic tissue within the wound bed. Assessment Active Problems ICD-10 WRIGHT, GRAVELY ([id number]) Cutaneous  abscess of buttock Disruption of external operation (surgical) wound, not elsewhere classified, initial encounter Non-pressure chronic ulcer of buttock with fat layer exposed Plan Discharge From [facility] Services: Discharge from [place] Treatment Complete 1. I Would recommend currently that we discontinue wound care services as the patient is completely healed and I am very pleased in that regard. 2. I am also can recommend that we go ahead and have the patient continue to signs of worsening if anything changes he should let me know soon as possible. We will see patient back for reevaluation in 1 week here in the clinic. If anything worsens or changes patient will contact our office for additional recommendations. Electronic Signature(s) Signed: 08/15/2021 6:03:52 PM By: Worthy Keeler PA-C Entered By: Worthy Keeler on 08/15/2021 18:03:52 Sir, Mallis Grace Bushy ([id number]) -------------------------------------------------------------------------------- SuperBill Details Patient Name: Jared [name]. Date of Service: 08/15/2021 Medical Record Number: [id number] Patient Account Number: [id number] Date of Birth/Sex: [birth year] (66 y.o. M) Treating RN: Carlene Coria Primary Care Provider: [name] Other Clinician: Referring Provider: [name] Treating Provider/Extender: Skipper Cliche in Treatment: 3 Diagnosis Coding ICD-10 Codes Code Description L02.31 Cutaneous abscess of buttock T81.31XA Disruption of external operation (surgical) wound, not elsewhere classified, initial encounter L98.412 Non-pressure chronic ulcer of buttock with fat layer exposed Facility Procedures CPT4 Code: [id number] Description: [id number] - WOUND CARE VISIT-LEV 2 EST PT Modifier: Quantity: 1 Physician Procedures CPT4 Code: [id number] Description: [id number] - WC PHYS LEVEL 3 - EST PT Modifier: Quantity: 1 CPT4 Code: Description: ICD-10 Diagnosis Description L02.31 Cutaneous abscess of buttock  T81.31XA Disruption of external operation (surgical) wound, not elsewhere classifi L98.412 Non-pressure chronic ulcer of buttock with fat layer exposed Modifier: ed, initial encounter Quantity: Electronic Signature(s) Signed: 08/15/2021 6:04:17 PM By: Worthy Keeler PA-C Entered By: Worthy Keeler on 08/15/2021 18:04:17

## 2021-08-16 NOTE — Progress Notes (Signed)
VIPUL, CAFARELLI ([id number]) Visit Report for 08/15/2021 Arrival Information Details Patient Name: Jared [name], Jared [name]. Date of Service: 08/15/2021 1:15 PM Medical Record Number: [id number] Patient Account Number: [id number] Date of Birth/Sex: [birth year] (66 y.o. M) Treating RN: Carlene Coria Primary Care Elier Zellars: [name] Other Clinician: Referring Daly Whipkey: [name] Treating Janda Cargo/Extender: Skipper Cliche in Treatment: 3 Visit Information History Since Last Visit All ordered tests and consults were completed: No Patient Arrived: Ambulatory Added or deleted any medications: No Arrival Time: 13:24 Any new allergies or adverse reactions: No Accompanied By: self Had a fall or experienced change in No Transfer Assistance: None activities of daily living that may affect Patient Identification Verified: Yes risk of falls: Secondary Verification Process Completed: Yes Signs or symptoms of abuse/neglect since last visito No Patient Requires Transmission-Based Precautions: No Hospitalized since last visit: No Patient Has Alerts: No Implantable device outside of the clinic excluding No cellular tissue based products placed in the center since last visit: Has Dressing in Place as Prescribed: Yes Pain Present Now: No Electronic Signature(s) Signed: 08/16/2021 2:31:10 PM By: Carlene Coria RN Entered By: Carlene Coria on 08/15/2021 13:27:20 Jared [name] ([id number]) -------------------------------------------------------------------------------- Clinic Level of Care Assessment Details Patient Name: Jared [name]. Date of Service: 08/15/2021 1:15 PM Medical Record Number: [id number] Patient Account Number: [id number] Date of Birth/Sex: 01/04/1956 (66 y.o. M) Treating RN: Carlene Coria Primary Care Eloyse Causey: [name] Other Clinician: Referring Joson Sapp: [name] Treating Joliana Claflin/Extender: Skipper Cliche in Treatment: 3 Clinic Level of Care  Assessment Items TOOL 4 Quantity Score X - Use when only an EandM is performed on FOLLOW-UP visit 1 0 ASSESSMENTS - Nursing Assessment / Reassessment X - Reassessment of Co-morbidities (includes updates in patient status) 1 10 X- 1 5 Reassessment of Adherence to Treatment Plan ASSESSMENTS - Wound and Skin Assessment / Reassessment X - Simple Wound Assessment / Reassessment - one wound 1 5 []  - 0 Complex Wound Assessment / Reassessment - multiple wounds []  - 0 Dermatologic / Skin Assessment (not related to wound area) ASSESSMENTS - Focused Assessment []  - Circumferential Edema Measurements - multi extremities 0 []  - 0 Nutritional Assessment / Counseling / Intervention []  - 0 Lower Extremity Assessment (monofilament, tuning fork, pulses) []  - 0 Peripheral Arterial Disease Assessment (using hand held doppler) ASSESSMENTS - Ostomy and/or Continence Assessment and Care []  - Incontinence Assessment and Management 0 []  - 0 Ostomy Care Assessment and Management (repouching, etc.) PROCESS - Coordination of Care X - Simple Patient / Family Education for ongoing care 1 15 []  - 0 Complex (extensive) Patient / Family Education for ongoing care []  - 0 Staff obtains Programmer, systems, Records, Test Results / Process Orders []  - 0 Staff telephones HHA, Nursing Homes / Clarify orders / etc []  - 0 Routine Transfer to another Facility (non-emergent condition) []  - 0 Routine Hospital Admission (non-emergent condition) []  - 0 New Admissions / Biomedical engineer / Ordering NPWT, Apligraf, etc. []  - 0 Emergency Hospital Admission (emergent condition) X- 1 10 Simple Discharge Coordination []  - 0 Complex (extensive) Discharge Coordination PROCESS - Special Needs []  - Pediatric / Minor Patient Management 0 []  - 0 Isolation Patient Management []  - 0 Hearing / Language / Visual special needs []  - 0 Assessment of Community assistance (transportation, D/C planning, etc.) []  - 0 Additional  assistance / Altered mentation []  - 0 Support Surface(s) Assessment (bed, cushion, seat, etc.) INTERVENTIONS - Wound Cleansing / Measurement JAMIEON, LANNEN ([id number]) X- 1 5 Simple Wound Cleansing -  one wound []  - 0 Complex Wound Cleansing - multiple wounds X- 1 5 Wound Imaging (photographs - any number of wounds) []  - 0 Wound Tracing (instead of photographs) X- 1 5 Simple Wound Measurement - one wound []  - 0 Complex Wound Measurement - multiple wounds INTERVENTIONS - Wound Dressings X - Small Wound Dressing one or multiple wounds 1 10 []  - 0 Medium Wound Dressing one or multiple wounds []  - 0 Large Wound Dressing one or multiple wounds []  - 0 Application of Medications - topical []  - 0 Application of Medications - injection INTERVENTIONS - Miscellaneous []  - External ear exam 0 []  - 0 Specimen Collection (cultures, biopsies, blood, body fluids, etc.) []  - 0 Specimen(s) / Culture(s) sent or taken to Lab for analysis []  - 0 Patient Transfer (multiple staff / Civil Service fast streamer / Similar devices) []  - 0 Simple Staple / Suture removal (25 or less) []  - 0 Complex Staple / Suture removal (26 or more) []  - 0 Hypo / Hyperglycemic Management (close monitor of Blood Glucose) []  - 0 Ankle / Brachial Index (ABI) - do not check if billed separately X- 1 5 Vital Signs Has the patient been seen at the hospital within the last three years: Yes Total Score: 75 Level Of Care: New/Established - Level 2 Electronic Signature(s) Signed: 08/16/2021 2:31:10 PM By: Carlene Coria RN Entered By: Carlene Coria on 08/15/2021 14:24:02 Jared [name] ([id number]) -------------------------------------------------------------------------------- Encounter Discharge Information Details Patient Name: Jared [name]. Date of Service: 08/15/2021 1:15 PM Medical Record Number: [id number] Patient Account Number: [id number] Date of Birth/Sex: 09/04/1955 (66 y.o. M) Treating RN: Carlene Coria Primary  Care Leanard Dimaio: [name] Other Clinician: Referring Sairah Knobloch: [name] Treating Debhora Titus/Extender: Skipper Cliche in Treatment: 3 Encounter Discharge Information Items Discharge Condition: Stable Ambulatory Status: Ambulatory Discharge Destination: Home Transportation: Private Auto Accompanied By: self Schedule Follow-up Appointment: Yes Clinical Summary of Care: Patient Declined Electronic Signature(s) Signed: 08/16/2021 2:31:10 PM By: Carlene Coria RN Entered By: Carlene Coria on 08/15/2021 14:25:16 Jared [name] ([id number]) -------------------------------------------------------------------------------- Lower Extremity Assessment Details Patient Name: Jared [name]. Date of Service: 08/15/2021 1:15 PM Medical Record Number: [id number] Patient Account Number: [id number] Date of Birth/Sex: 11-19-1955 (66 y.o. M) Treating RN: Carlene Coria Primary Care Devondre Guzzetta: [name] Other Clinician: Referring Makaya Juneau: [name] Treating Sarayu Prevost/Extender: Skipper Cliche in Treatment: 3 Electronic Signature(s) Signed: 08/16/2021 2:31:10 PM By: Carlene Coria RN Entered By: Carlene Coria on 08/15/2021 13:31:08 Jared [name] ([id number]) -------------------------------------------------------------------------------- Multi Wound Chart Details Patient Name: DIDIER, BRANDENBURG. Date of Service: 08/15/2021 1:15 PM Medical Record Number: [id number] Patient Account Number: [id number] Date of Birth/Sex: 1956-02-11 (66 y.o. M) Treating RN: Carlene Coria Primary Care Hser Belanger: [name] Other Clinician: Referring Keion Neels: [name] Treating Ritu Gagliardo/Extender: Skipper Cliche in Treatment: 3 Vital Signs Height(in): 78 Pulse(bpm): 61 Weight(lbs): 227 Blood Pressure(mmHg): 136/74 Body Mass Index(BMI): 26.2 Temperature(F): 98.3 Respiratory Rate(breaths/min): 20 Photos: [N/A:N/A] Wound Location: Left Gluteus N/A N/A Wounding Event: Surgical  Injury N/A N/A Primary Etiology: Open Surgical Wound N/A N/A Date Acquired: 05/24/2021 N/A N/A Weeks of Treatment: 3 N/A N/A Wound Status: Open N/A N/A Wound Recurrence: No N/A N/A Measurements L x W x D (cm) 0x0x0 N/A N/A Area (cm) : 0 N/A N/A Volume (cm) : 0 N/A N/A % Reduction in Area: 100.00% N/A N/A % Reduction in Volume: 100.00% N/A N/A Classification: Full Thickness Without Exposed N/A N/A Support Structures Exudate Amount: Medium N/A N/A Exudate Type: Serosanguineous N/A N/A Exudate Color: red, brown  N/A N/A Granulation Amount: Large (67-100%) N/A N/A Granulation Quality: Red N/A N/A Necrotic Amount: None Present (0%) N/A N/A Exposed Structures: Fat Layer (Subcutaneous Tissue): N/A N/A Yes Fascia: No Tendon: No Muscle: No Joint: No Bone: No Epithelialization: None N/A N/A Treatment Notes Electronic Signature(s) Signed: 08/16/2021 2:31:10 PM By: Carlene Coria RN Entered By: Carlene Coria on 08/15/2021 14:22:49 Jared [name] ([id number]) -------------------------------------------------------------------------------- Tira Details Patient Name: Jared [name]. Date of Service: 08/15/2021 1:15 PM Medical Record Number: [id number] Patient Account Number: [id number] Date of Birth/Sex: 1955/08/03 (66 y.o. M) Treating RN: Carlene Coria Primary Care Carliss Porcaro: [name] Other Clinician: Referring Harlynn Kimbell: [name] Treating Christoph Copelan/Extender: Skipper Cliche in Treatment: 3 Active Inactive Electronic Signature(s) Signed: 08/16/2021 2:31:10 PM By: Carlene Coria RN Entered By: Carlene Coria on 08/15/2021 14:22:33 Jared [name] ([id number]) -------------------------------------------------------------------------------- Pain Assessment Details Patient Name: Jared [name]. Date of Service: 08/15/2021 1:15 PM Medical Record Number: [id number] Patient Account Number: [id number] Date of Birth/Sex: [birth year] (66 y.o. M) Treating  RN: Carlene Coria Primary Care Alleya Demeter: [name] Other Clinician: Referring Frank Pilger: [name] Treating Leler Brion/Extender: Skipper Cliche in Treatment: 3 Active Problems Location of Pain Severity and Description of Pain Patient Has Paino No Site Locations Pain Management and Medication Current Pain Management: Electronic Signature(s) Signed: 08/16/2021 2:31:10 PM By: Carlene Coria RN Entered By: Carlene Coria on 08/15/2021 13:27:48 Jared [name] ([id number]) -------------------------------------------------------------------------------- Patient/Caregiver Education Details Patient Name: Jared [name]. Date of Service: 08/15/2021 1:15 PM Medical Record Number: [id number] Patient Account Number: [id number] Date of Birth/Gender: 1956-03-18 (66 y.o. M) Treating RN: Carlene Coria Primary Care Physician: [name] Other Clinician: Referring Physician: [name] Treating Physician/Extender: Skipper Cliche in Treatment: 3 Education Assessment Education Provided To: Patient Education Topics Provided Wound/Skin Impairment: Methods: Explain/Verbal Responses: State content correctly Electronic Signature(s) Signed: 08/16/2021 2:31:10 PM By: Carlene Coria RN Entered By: Carlene Coria on 08/15/2021 14:24:18 Jared [name] ([id number]) -------------------------------------------------------------------------------- Wound Assessment Details Patient Name: Jared [name]. Date of Service: 08/15/2021 1:15 PM Medical Record Number: [id number] Patient Account Number: [id number] Date of Birth/Sex: [birth year] (66 y.o. M) Treating RN: Carlene Coria Primary Care Blayton Huttner: [name] Other Clinician: Referring Lujean Ebright: [name] Treating Juandaniel Manfredo/Extender: Skipper Cliche in Treatment: 3 Wound Status Wound Number: 1 Primary Etiology: Open Surgical Wound Wound Location: Left Gluteus Wound Status: Open Wounding Event: Surgical Injury Date  Acquired: 05/24/2021 Weeks Of Treatment: 3 Clustered Wound: No Photos Wound Measurements Length: (cm) 0 Width: (cm) 0 Depth: (cm) 0 Area: (cm) Volume: (cm) % Reduction in Area: 100% % Reduction in Volume: 100% Epithelialization: None 0 Tunneling: No 0 Undermining: No Wound Description Classification: Full Thickness Without Exposed Support Structu Exudate Amount: Medium Exudate Type: Serosanguineous Exudate Color: red, brown res Foul Odor After Cleansing: No Slough/Fibrino No Wound Bed Granulation Amount: Large (67-100%) Exposed Structure Granulation Quality: Red Fascia Exposed: No Necrotic Amount: None Present (0%) Fat Layer (Subcutaneous Tissue) Exposed: Yes Tendon Exposed: No Muscle Exposed: No Joint Exposed: No Bone Exposed: No Electronic Signature(s) Signed: 08/16/2021 2:31:10 PM By: Carlene Coria RN Entered By: Carlene Coria on 08/15/2021 13:30:47 Jared [name] ([id number]) -------------------------------------------------------------------------------- Vitals Details Patient Name: Jared [name]. Date of Service: 08/15/2021 1:15 PM Medical Record Number: [id number] Patient Account Number: [id number] Date of Birth/Sex: 1956-06-24 (66 y.o. M) Treating RN: Carlene Coria Primary Care Tamara Monteith: [name] Other Clinician: Referring Westyn Keatley: [name] Treating Gianah Batt/Extender: Skipper Cliche in Treatment: 3 Vital Signs Time Taken: 13:27 Temperature (F): 98.3 Height (in): 78 Pulse (  bpm): 61 Weight (lbs): 227 Respiratory Rate (breaths/min): 20 Body Mass Index (BMI): 26.2 Blood Pressure (mmHg): 136/74 Reference Range: 80 - 120 mg / dl Electronic Signature(s) Signed: 08/16/2021 2:31:10 PM By: Carlene Coria RN Entered By: Carlene Coria on 08/15/2021 13:27:40

## 2021-08-19 ENCOUNTER — Encounter: Payer: Self-pay | Admitting: Psychiatry

## 2021-08-19 ENCOUNTER — Other Ambulatory Visit: Payer: Self-pay

## 2021-08-19 ENCOUNTER — Ambulatory Visit (INDEPENDENT_AMBULATORY_CARE_PROVIDER_SITE_OTHER): Payer: BC Managed Care – PPO | Admitting: Psychiatry

## 2021-08-19 VITALS — BP 153/82 | HR 62 | Temp 97.8°F | Wt 235.0 lb

## 2021-08-19 DIAGNOSIS — F321 Major depressive disorder, single episode, moderate: Secondary | ICD-10-CM

## 2021-08-19 MED ORDER — BUPROPION HCL ER (XL) 150 MG PO TB24: 150.0000 mg | ORAL_TABLET | Freq: Every day | ORAL | 0 refills | Status: DC

## 2021-09-13 NOTE — Progress Notes (Signed)
Virtual Visit via Telephone Note  I connected with Jared [name] on 09/17/21 at 10:30 AM EST by telephone and verified that I am speaking with the correct person using two identifiers.  Location: Patient: home Provider: office Persons participated in the visit- patient, provider    I discussed the limitations, risks, security and privacy concerns of performing an evaluation and management service by telephone and the availability of in person appointments. I also discussed with the patient that there may be a patient responsible charge related to this service. The patient expressed understanding and agreed to proceed.     I discussed the assessment and treatment plan with the patient. The patient was provided an opportunity to ask questions and all were answered. The patient agreed with the plan and demonstrated an understanding of the instructions.   The patient was advised to call back or seek an in-person evaluation if the symptoms worsen or if the condition fails to improve as anticipated.  I provided 15 minutes of non-face-to-face time during this encounter.   Norman Clay, MD    [facility] MD/PA/NP OP Progress Note  09/17/2021 11:08 AM Jared [name]  MRN:  [id number]  Chief Complaint:  Chief Complaint  Patient presents with   Follow-up   Depression   HPI:  This is a follow-up appointment for depression.  He states that he has been doing well.  Although he initially felt odd when he was started on bupropion, it subsided after taking it for 2 weeks.  He notices that blue time has been diminished.  He also has been able to do things around the house.  He believes "not care" has been improved.  He thinks anything is better.  What he misses is energy level.  He continues to feel fatigued.  His wife noticed that he makes noises when he is asleep.  He denies change in appetite.  He denies anxiety.  He denies SI.  He feels comfortable to stay on the medication as it is.    Daily  routine: work Support: Household: wife, mother in [employer] Marital status: married for 29 years Number of children: 2 (33 son in [place], 88 daughter in [place]) Employment: system admin Education:   Last PCP / ongoing medical evaluation:   Visit Diagnosis:    ICD-10-CM   1. MDD (major depressive disorder), recurrent episode, mild (HCC)  F33.0 TSH    2. Fatigue, unspecified type  R53.83       Past Psychiatric History: Please see initial evaluation for full details. I have reviewed the history. No updates at this time.     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   CHOLECYSTECTOMY     added umbilical mesh   COLONOSCOPY WITH PROPOFOL N/A 03/27/2017   Procedure: COLONOSCOPY WITH PROPOFOL;  Surgeon: Manya Silvas, MD;  Location[facility] ENDOSCOPY;  Service: Endoscopy;  Laterality: N/A;   ESOPHAGOGASTRODUODENOSCOPY (EGD) WITH PROPOFOL N/A  03/27/2017   Procedure: ESOPHAGOGASTRODUODENOSCOPY (EGD) WITH PROPOFOL;  Surgeon: Manya Silvas, MD;  [facility] ENDOSCOPY;  Service: Endoscopy;  Laterality: N/A;   hip fracture with rod placement Right    MASS EXCISION Left 05/24/2021   Procedure: EXCISION MASS;  Surgeon: Benjamine Sprague, DO;  Location: [facility] ORS;  Service: General;  Laterality: Left;  Prone postition    Family Psychiatric History: Please see initial evaluation for full details. I have reviewed the history. No updates at this time.     Family History:  Family History  Problem Relation Age of Onset   Depression Mother    Depression  Father    Depression Sister    Depression Sister    Depression Brother     Social History:  Social History   Socioeconomic History   Marital status: Married    Spouse name: Not on file   Number of children: 2   Years of education: Not on file   Highest education level: Associate degree: occupational, Hotel manager, or vocational program  Occupational History   Not on file  Tobacco Use   Smoking status: Former    Packs/day: 1.00    Years: 45.00    Pack years: 45.00    Types: Cigarettes    Quit date: 01/30/2019    Years since quitting: 2.6   Smokeless tobacco: Never  Vaping Use   Vaping Use: Former  Substance and Sexual Activity   Alcohol use: No   Drug use: No   Sexual activity: Yes    Birth control/protection: None  Other Topics Concern   Not on file  Social History Narrative   Not on file   Social Determinants of Health   Financial Resource Strain: Not on file  Food Insecurity: Not on file  Transportation Needs: Not on file  Physical Activity: Not on file  Stress: Not on file  Social Connections: Not on file    Allergies: No Known Allergies  Metabolic Disorder Labs: No results found for: HGBA1C, MPG No results found for: PROLACTIN No results found for: CHOL, TRIG, HDL, CHOLHDL, VLDL, LDLCALC No results found for: TSH  Therapeutic Level Labs: No results found for: LITHIUM No results found for: VALPROATE No components found for:  CBMZ  Current Medications: Current Outpatient Medications  Medication Sig Dispense Refill   [START ON 09/19/2021] buPROPion (WELLBUTRIN XL) 150 MG 24 hr tablet Take 1 tablet (150 mg total) by mouth daily. 30 tablet 0   escitalopram (LEXAPRO) 20 MG tablet Take 20 mg by mouth daily.     ibuprofen (ADVIL,MOTRIN) 200 MG tablet Take 400 mg by mouth every 6 (six) hours as needed for mild pain.     melatonin 5 MG TABS Take 5 mg by mouth.     methocarbamol (ROBAXIN) 500 MG tablet Take 250 mg by mouth at bedtime as needed for muscle spasms.      Multiple Vitamins-Minerals (CENTRUM SILVER 50+MEN PO) Take 1 tablet by mouth daily.     tadalafil (CIALIS) 5 MG tablet Take 1 tablet (5 mg total) by mouth daily as needed for erectile dysfunction. 30 tablet 6   No current facility-administered medications for this visit.     Musculoskeletal: Strength & Muscle Tone:  N/A Gait & Station:  N/A Patient leans: N/A  Psychiatric Specialty Exam: Review of Systems  Psychiatric/Behavioral:  Positive for dysphoric mood and sleep disturbance. Negative for agitation, behavioral problems, confusion, decreased concentration, hallucinations, self-injury and suicidal ideas. The patient is not nervous/anxious and is not hyperactive.  All other systems reviewed and are negative.  There were no vitals taken for this visit.There is no height or weight on file to calculate BMI.  General Appearance: NA  Eye Contact:  NA  Speech:  Clear and Coherent  Volume:  Normal  Mood:   pretty good  Affect:  NA  Thought Process:  Coherent  Orientation:  Full (Time, Place, and Person)  Thought Content: Logical   Suicidal Thoughts:  No  Homicidal Thoughts:  No  Memory:  Immediate;   Good  Judgement:  Good  Insight:  Good  Psychomotor Activity:  Normal  Concentration:  Concentration: Good and Attention Span: Good  Recall:  Good  Fund of Knowledge: Good  Language: Good  Akathisia:  No  Handed:  Right  AIMS (if indicated): not done  Assets:  Communication Skills Desire for Improvement  ADL's:  Intact  Cognition: WNL  Sleep:  Fair   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 year old male with a history of depression, arthritis, GERD, pancreatic cancer s/p excision, who presents for follow up appointment for below.    1. MDD (major depressive disorder), recurrent episode, mild (HCC) There has been overall improvement in depressive symptoms since starting bupropion.  Psychosocial stressors includes occasional marital conflict. He has been working at the current place for more than 10 years, and has been handling things relatively well despite his mood symptoms.  Will continue current dose of bupropion at this time to see if it exerts more benefit over the next few weeks.  He has no known history of seizure.  Will continue escitalopram to target depression.  Discussed potential risk of QTC prolongation; will continue current dose at this time given he has good benefit from this medication, and the recent EKG without QTC prolongation despite bradycardia.  He has a core belief of not loving himself, which she attributes to prior relationships.  He will greatly benefit from CBT; will make referral.   2. Fatigue, unspecified type He continues to report significant fatigue.  He also has known snoring/making sounds while he is asleep.  Will make referral for evaluation of sleep apnea.  Will also check lab to rule out any medical issues contributing to this.   1. Current moderate episode of major depressive disorder without prior episode ([place]) He reports worsening in depressive symptoms over the past 2 years with prominent apathy/anhedonia.  Psychosocial stressors includes occasional marital conflict.   Will add bupropion for depression.  He has no known history of seizure.  Discussed potential risk of headache, palpitation.  Will continue escitalopram to target depression although he had a bradycardia, there is no QTC prolongation on EKG a few months ago.  We will continue to monitor especially given his age.  Noted  that he reports core belief of not loving himself, which he attributes to prior relationships. He will greatly benefit from CBT; he will consider this option at this time.    Plan Continue bupropion 150 mg daily  Continue escitalopram 20 mg daily (QTc 412 msec in 04/2021, HR 52 sinus bradycardia) Next appointment: 3/27 at 4:30 for 30 mins, video [email or web address] Obtain TSH (he would like it to be done by his PCP) Referral for evaluation of sleep   The patient demonstrates the following risk factors for suicide: Chronic risk factors for suicide include: psychiatric disorder of depression . Acute risk factors for suicide include: family or marital conflict. Protective factors for this patient include: hope for the future and religious beliefs against suicide. Considering these factors, the overall suicide risk at this point appears to be low. Patient is appropriate for outpatient follow up.   Collaboration of Care: Collaboration of Care: Other referral for evaluation of sleep apnea  Patient/Guardian was advised Release of Information must be obtained prior to any record release in order to collaborate their care with an outside provider. Patient/Guardian was advised if they have not already done so to contact the registration department to sign all necessary forms in order for [place] to release information regarding their care.   Consent: Patient/Guardian gives verbal consent for treatment and assignment of benefits for services provided during this visit. Patient/Guardian expressed understanding and agreed to proceed.    Norman Clay, MD 09/17/2021, 11:08 AM

## 2021-09-16 ENCOUNTER — Other Ambulatory Visit: Payer: Self-pay | Admitting: Psychiatry

## 2021-09-17 ENCOUNTER — Other Ambulatory Visit: Payer: Self-pay

## 2021-09-17 ENCOUNTER — Encounter: Payer: Self-pay | Admitting: Psychiatry

## 2021-09-17 ENCOUNTER — Telehealth (INDEPENDENT_AMBULATORY_CARE_PROVIDER_SITE_OTHER): Payer: BC Managed Care – PPO | Admitting: Psychiatry

## 2021-09-17 DIAGNOSIS — R5383 Other fatigue: Secondary | ICD-10-CM

## 2021-09-17 DIAGNOSIS — F330 Major depressive disorder, recurrent, mild: Secondary | ICD-10-CM | POA: Diagnosis not present

## 2021-09-17 MED ORDER — BUPROPION HCL ER (XL) 150 MG PO TB24: 150.0000 mg | ORAL_TABLET | Freq: Every day | ORAL | 0 refills | Status: DC

## 2021-09-17 NOTE — Patient Instructions (Signed)
Continue bupropion 150 mg daily  Continue escitalopram 20 mg daily  Next appointment: 3/27 at 4:30

## 2021-09-17 NOTE — Addendum Note (Signed)
Addended by: Norman Clay on: 09/17/2021 01:20 PM   Modules accepted: Orders

## 2021-09-18 ENCOUNTER — Other Ambulatory Visit: Payer: Self-pay

## 2021-09-18 ENCOUNTER — Ambulatory Visit (INDEPENDENT_AMBULATORY_CARE_PROVIDER_SITE_OTHER): Payer: BC Managed Care – PPO | Admitting: Licensed Clinical Social Worker

## 2021-09-18 DIAGNOSIS — F330 Major depressive disorder, recurrent, mild: Secondary | ICD-10-CM

## 2021-09-18 NOTE — Progress Notes (Signed)
Virtual Visit via Video Note  I connected with Jared [name] on 09/18/21 at  2:00 PM EST by a video enabled telemedicine application and verified that I am speaking with the correct person using two identifiers.  Location: Patient: home Provider: [facility]   I discussed the limitations of evaluation and management by telemedicine and the availability of in person appointments. The patient expressed understanding and agreed to proceed.  I discussed the assessment and treatment plan with the patient. The patient was provided an opportunity to ask questions and all were answered. The patient agreed with the plan and demonstrated an understanding of the instructions.   The patient was advised to call back or seek an in-person evaluation if the symptoms worsen or if the condition fails to improve as anticipated.  I provided 60 minutes of non-face-to-face time during this encounter.   Granville Lewis, LCSW   Comprehensive Clinical Assessment (CCA) Note  09/19/2021 Jared [name] [id number]  Chief Complaint:  Chief Complaint  Patient presents with   Establish Care   Depression   Visit Diagnosis:  MDD, recurrent, mild    Jared [name] is a 66yo male reporting to [facility] virtually for establishment of individual psychotherapy services. Pt reports that he has been married two times--most previous wife for 30+ years. Pt has two children--daughter and son (both adults). Pt reports that he has a good relationship with both children.  Pt reports that he has struggled with depression for most of his adult life. Pt reports that he is not suicidal but "would be okay if he died--just not doing it himself". Pt reports that he felt troubled in his early years and still has a lot of guilt associated with that. Pt reports that he used to drink and use substances in the past.  Pt reports that he did quit smoking and the last time he has smoked THC was one month ago.  Pt is currently under psychiatric care of  Dr. Modesta Messing and is currently taking Lexapro and wellbutrin. 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 Pt denies SI, HI, AVH. Pt states that he used THC one month ago.    CCA Screening, Triage and Referral (STR)  Patient Reported Information How did you hear about [place]? Self  Referral name: No data recorded Referral phone number: No data recorded  Whom do you see for routine medical problems? No data recorded Practice/Facility Name: No data recorded Practice/Facility Phone Number: No data recorded Name of Contact: No data recorded Contact Number: No data recorded Contact Fax Number: No data recorded Prescriber Name: No data recorded Prescriber Address (if known): No data recorded  What Is the Reason for Your Visit/Call Today? No data recorded How Long Has This Been Causing You Problems? > than 6 months  What Do You Feel Would Help You the Most Today? Treatment for Depression or other mood problem   Have You Recently Been in Any Inpatient Treatment (Hospital/Detox/Crisis Center/28-Day Program)? No  Name/Location of Program/Hospital:No data recorded How Long Were You There? No data recorded When Were You Discharged? No data recorded  Have You Ever Received Services From [facility] Before? Yes  Who Do You See at [facility]? No data recorded  Have You Recently Had Any Thoughts About Hurting Yourself? No data recorded Are You Planning to Commit Suicide/Harm Yourself At This time? No data recorded  Have you Recently Had Thoughts About [place]? No data recorded Explanation: No data recorded  Have You Used Any Alcohol or Drugs in the Past 24 Hours? No data recorded How Long Ago Did You Use Drugs or Alcohol? No data recorded What Did You Use and How Much? No data recorded  Do You Currently Have a Therapist/Psychiatrist? No data recorded Name of Therapist/Psychiatrist: No data recorded  Have You Been Recently Discharged From Any  Office Practice or Programs? No data recorded Explanation of Discharge From Practice/Program: No data recorded    CCA Screening Triage Referral Assessment Type of Contact: Tele-Assessment  Is this Initial or Reassessment? Initial Assessment  Date Telepsych consult ordered in [facility]:  No data recorded Time Telepsych consult ordered in [facility]:  No data recorded  Patient Reported Information Reviewed? No data recorded Patient Left Without Being Seen? No data recorded Reason for Not Completing Assessment: No data recorded  Collateral Involvement: No data recorded  Does Patient Have a L[place]ed? No data recorded Name and Contact of Legal Guardian: No data recorded If Minor and Not Living with Parent(s), Who has Custody? No data recorded Is CPS involved or ever been involved? Never  Is APS involved or ever been involved? Never   Patient Determined To Be At Risk for Harm To Self or Others Based on Review of Patient Reported Information or Presenting Complaint? No  Method: No data recorded Availability of Means: No data recorded Intent: No data recorded Notification Required: No data recorded Additional Information for Danger to Others Potential: No data recorded Additional Comments for Danger to Others Potential: No data recorded Are There Guns or Other Weapons in Your Home? No data recorded Types of Guns/Weapons: No data recorded Are These Weapons Safely Secured?                            No data recorded Who Could Verify You Are Able To Have These Secured: No data recorded Do You Have any Outstanding Charges, Pending Court Dates, Parole/Probation? No data recorded Contacted To Inform of Risk of Harm To Self or Others: No data recorded  Location of Assessment: No data recorded  Does Patient Present under Involuntary Commitment? No data recorded IVC Papers Initial File Date: No data recorded  [place] of Residence: [place]   Patient Currently Receiving the  Following Services: Medication Management   Determination of Need: Routine (7 days)   Options For Referral: Outpatient Therapy; Medication Management     CCA Biopsychosocial Intake/Chief Complaint:  Jared [name] is a 66yo male reporting to [facility] virtually for establishment of individual psychotherapy services. Pt reports that he has been married two times--most previous wife for 30+ years. Pt has two children--daughter and son (both adults). Pt reports that he has a good relationship with both children.  Pt reports that he has struggled with depression for most of his adult life. Pt reports that he is not suicidal but "would be okay if he died--just not doing it himself". Pt reports that he felt troubled in his early years and still has a lot of guilt associated with that. Pt reports that he used to drink and use substances in the past.  Pt reports that he did quit smoking and the last time he has smoked THC was one month ago.  Pt is currently under psychiatric care of Dr. Modesta Messing and is currently taking Lexapro and wellbutrin. 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 Pt denies SI, HI, AVH. Pt states that he used [facility]  one month ago.  Current Symptoms/Problems: depression   Patient Reported Schizophrenia/Schizoaffective Diagnosis in Past: No   Strengths: pt has good family supports; good motivation to change; good physical health  Preferences: outpatient psychiatric supports  Abilities: No data recorded  Type of Services Patient Feels are Needed: medication management; individual psychotherapy   Initial Clinical Notes/Concerns: No data recorded  Mental Health Symptoms Depression:  Change in energy/activity; Difficulty Concentrating; Fatigue; Hopelessness; Worthlessness   Duration of Depressive symptoms: Greater than two weeks   Mania:  None   Anxiety:   Worrying; Restlessness; Irritability; Fatigue; Difficulty concentrating   Psychosis:  None    Duration of Psychotic symptoms: No data recorded  Trauma:  None   Obsessions:  None   Compulsions:  No data recorded  Inattention:  None   Hyperactivity/Impulsivity:  None   Oppositional/Defiant Behaviors:  None   Emotional Irregularity:  None   Other Mood/Personality Symptoms:  No data recorded   Mental Status Exam Appearance and self-care  Stature:   Average   Weight:   Average weight   Clothing:   Neat/clean   Grooming:   Normal   Cosmetic use:   None   Posture/gait:   Normal   Motor activity:   Not Remarkable   Sensorium  Attention:   Normal   Concentration:   Normal   Orientation:   X5   Recall/memory:   Normal   Affect and Mood  Affect:   Anxious   Mood:   Anxious   Relating  Eye contact:   Normal   Facial expression:   Anxious   Attitude toward examiner:   Cooperative   Thought and Language  Speech flow:  Clear and Coherent   Thought content:   Appropriate to Mood and Circumstances   Preoccupation:   None   Hallucinations:   None   Organization:  No data recorded  [employer] of Knowledge:   Good   Intelligence:   Average   Abstraction:   Normal   Judgement:   Good   Reality Testing:   Realistic   Insight:   Good   Decision Making:   Normal   Social Functioning  Social Maturity:   Responsible   Social Judgement:   Normal   Stress  Stressors:  No data recorded  Coping Ability:   Normal   Skill Deficits:   None   Supports:   Family     Religion: Religion/Spirituality Are You A Religious Person?: Yes  Leisure/Recreation: Leisure / Recreation Do You Have Hobbies?: Yes Leisure and Hobbies: yardwork; hanging out by pool  Exercise/Diet: Exercise/Diet Do You Exercise?: No Have You Gained or Lost A Significant Amount of Weight in the Past Six Months?: No Do You Follow a Special Diet?: No Do You Have Any Trouble Sleeping?: No   CCA  Employment/Education Employment/Work Situation: Employment / Work Nurse, 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 Where is Patient Currently Employed?: 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 Has Patient ever Been in Passenger transport manager?: No  Education: Education Did Teacher, adult education From [employer]?: Yes Did Physicist, medical?: No Did Heritage manager?: No Did You Have An Individualized Education Program (IIEP): No Did You Have Any Difficulty At [employer]?: No Patient's Education Has Been Impacted by Current Illness: No   CCA Family/Childhood History Family and Relationship History: Family history Marital status: Married  Childhood History:  Childhood History By whom was/is the patient raised?: Both parents  Additional childhood history information: pt lived with parents--stable environment Description of patient's relationship with caregiver when they were a child: stable Patient's description of current relationship with people who raised him/her: stable Does patient have siblings?: Yes Number of Siblings: 3  Child/Adolescent Assessment:  none   CCA Substance Use Alcohol/Drug Use: Alcohol / Drug Use Pain Medications: SEE MAR Prescriptions: SEE MAR Over the Counter: SEE MAR History of alcohol / drug use?: Yes Substance #1 Name of Substance 1: ETOH 1 - Frequency: social 1 - Method of Aquiring: legal 1- Route of Use: oral/drink Substance #2 Name of Substance 2: THC 2 - Frequency: rarely 2 - Last Use / Amount: one month ago 2 - Method of Aquiring: street 2 - Route of Substance Use: oral/smoke Substance #3 Name of Substance 3: COCAINE 3 - Frequency: in past 3 - Last Use / Amount: years ago 3 - Method of Aquiring: street 3 - Route of Substance Use: inhale/snort Substance #4 Name of Substance 4: LSD/HYPTNOTICS 4 - Frequency: in past 4 - Last Use / Amount: years 4 - Method of Aquiring: street 4 - Route of Substance Use:  edible Substance #5 Name of Substance 5: PILLS--VARIOUS 5 - Frequency: in past 5 - Last Use / Amount: years ago 5 - Method of Aquiring: street 5 - Route of Substance Use: oral/edible      ASAM's:  Six Dimensions of Multidimensional Assessment  Dimension 1:  Acute Intoxication and/or Withdrawal Potential:   Dimension 1:  Description of individual's past and current experiences of substance use and withdrawal: none  Dimension 2:  Biomedical Conditions and Complications:      Dimension 3:  Emotional, Behavioral, or Cognitive Conditions and Complications:     Dimension 4:  Readiness to Change:     Dimension 5:  Relapse, Continued use, or Continued Problem Potential:     Dimension 6:  Recovery/Living Environment:     ASAM Severity Score: ASAM's Severity Rating Score: 0  ASAM Recommended Level of Treatment: ASAM Recommended Level of Treatment: Level I Outpatient Treatment   Substance use Disorder (SUD) Substance Use Disorder (SUD)  Checklist Symptoms of Substance Use:  (none)  Recommendations for Services/Supports/Treatments: Recommendations for Services/Supports/Treatments Recommendations For Services/Supports/Treatments: Individual Therapy, Medication Management  DSM5 Diagnoses: Patient Active Problem List   Diagnosis Date Noted   RLS (restless legs syndrome) 09/19/2020   Varicose veins of both lower extremities 09/19/2020   TIA (transient ischemic attack) 01/01/2020   History of TIA (transient ischemic attack) 12/27/2019   PVC's (premature ventricular contractions) 12/27/2019   Pill dysphagia 03/17/2017   Hx of colonic polyps 12/05/2016   Irregular heart rate 12/05/2016   Hyperlipidemia 09/07/2015   Tobacco abuse 09/07/2015   Calculus, kidney 04/02/2015   Neuroendocrine tumor 06/22/2008    Patient Centered Plan: Patient is on the following Treatment Plan(s):  Depression   Referrals to Alternative Service(s): Referred to Alternative Service(s):   Place:   Date:   Time:     Referred to Alternative Service(s):   Place:   Date:   Time:    Referred to Alternative Service(s):   Place:   Date:   Time:    Referred to Alternative Service(s):   Place:   Date:   Time:      Collaboration of Care: Other Pt encouraged to continue scheduled follow ups with psychiatrist of record, Dr. Modesta Messing  Patient/Guardian was advised Release of Information must be obtained prior to any record release in order to collaborate their care with an  outside provider. Patient/Guardian was advised if they have not already done so to contact the registration department to sign all necessary forms in order for [place] to release information regarding their care.   Consent: Patient/Guardian gives verbal consent for treatment and assignment of benefits for services provided during this visit. Patient/Guardian expressed understanding and agreed to proceed.   Jared [name] R Tosha Belgarde, LCSW

## 2021-09-18 NOTE — Plan of Care (Signed)
Developed tx plan with pt input  

## 2021-10-09 ENCOUNTER — Ambulatory Visit (INDEPENDENT_AMBULATORY_CARE_PROVIDER_SITE_OTHER): Payer: BC Managed Care – PPO | Admitting: Urology

## 2021-10-09 ENCOUNTER — Other Ambulatory Visit: Payer: Self-pay

## 2021-10-09 ENCOUNTER — Encounter: Payer: Self-pay | Admitting: Urology

## 2021-10-09 VITALS — BP 128/74 | HR 76 | Ht 78.0 in | Wt 234.0 lb

## 2021-10-09 DIAGNOSIS — N529 Male erectile dysfunction, unspecified: Secondary | ICD-10-CM | POA: Diagnosis not present

## 2021-10-09 DIAGNOSIS — Z125 Encounter for screening for malignant neoplasm of prostate: Secondary | ICD-10-CM | POA: Diagnosis not present

## 2021-10-09 DIAGNOSIS — R310 Gross hematuria: Secondary | ICD-10-CM | POA: Diagnosis not present

## 2021-10-09 MED ORDER — TADALAFIL 10 MG PO TABS: 5.0000 mg | ORAL_TABLET | Freq: Every day | ORAL | 11 refills | Status: DC | PRN

## 2021-10-09 NOTE — Progress Notes (Signed)
? ?  10/09/2021 ?3:13 PM  ? ?Jared [name] ?11/14/1955 ?[id number] ? ?Reason for visit: ED, urinary symptoms, PSA screening, history of gross hematuria ? ?HPI: ?67 year old male with history of an interstitial cystitis type picture back in 2020 who symptoms have improved significantly since that time.  Cystoscopy was negative at that time, and he has been using Uribel as needed for flares of bladder pain/dysuria with great results.  He denies any significant urinary complaints today. ? ?He started Xarelto for a superficial thrombophlebitis back in March 2022 and had some gross hematuria.  At that time he underwent a CT urogram which was benign, and cystoscopy which showed some friable prostate vessels but was otherwise normal, his hematuria has since resolved since that time and he is no longer on anticoagulation. ? ?He has been on Cialis 5 mg as needed for ED with good results, but he would like to increase this dose to 10 mg to see if it works a little bit better. ? ?PSA normal at 0.64 and stable from 0.61 in 2021.  We reviewed the AUA guidelines regarding screening every 1 to 2 years, and reassurance provided regarding normal PSA. ? ?-Cialis dose increased to 10 mg as needed ?-Continue Uribel as needed for rare episodes of dysuria, we discussed possible foods that could cause flares of his IC type symptoms ?-RTC 1 year symptom check ? ?Billey Co, MD ? ?[facility] ?[address], [address] ?[place], [place] [address] ?([phone] ? ?

## 2021-10-09 NOTE — Progress Notes (Signed)
Virtual Visit via Video Note ? ?I connected with Jared [name] on 10/14/21 at  4:30 PM EDT by a video enabled telemedicine application and verified that I am speaking with the correct person using two identifiers. ? ?Location: ?Patient: work ?Provider: office ?Persons participated in the visit- patient, provider  ?  ?I discussed the limitations of evaluation and management by telemedicine and the availability of in person appointments. The patient expressed understanding and agreed to proceed. ? ?  ?I discussed the assessment and treatment plan with the patient. The patient was provided an opportunity to ask questions and all were answered. The patient agreed with the plan and demonstrated an understanding of the instructions. ?  ?The patient was advised to call back or seek an in-person evaluation if the symptoms worsen or if the condition fails to improve as anticipated. ? ?I provided 17 minutes of non-face-to-face time during this encounter. ? ? ?Norman Clay, MD ? ? ?BH MD/PA/NP OP Progress Note ? ?10/14/2021 5:08 PM ?Jared [name]  ?MRN:  [id number] ? ?Chief Complaint:  ?Chief Complaint  ?Patient presents with  ? Depression  ? Follow-up  ? ?HPI:  ?This is a follow-up appointment for depression.  ?He states that he has been feeling better.  However, he notices that he occasionally feels back to feeling depressed without any triggers.  He thinks it could be better.  He states that he quit smoking 3 years ago.  He feels lethargic, miss that "stimulant." He tends to procrastinate things.  However , he reports better relationship with his wife since being on medication as his mood has been better.  He sleeps well. Although he has been able to keep up his work, he does not feel satisfied as he could do better.  He denies SI.  He denies anxiety.  He has occasional issues with memory loss, such as being unable to recall the name of people.  He is willing to try higher dose bupropion at this time.  ? ?IADL- manages  finances, able to fill tax form ? ? ?Wt Readings from Last 3 Encounters:  ?10/09/21 234 lb (106.1 kg)  ?08/19/21 235 lb (106.6 kg)  ?05/24/21 227 lb (103 kg)  ?  ? ?Daily routine: work ?Support: ?Household: wife, mother in law ?Marital status: married for 29 years ?Number of children: 2 (64 son in [place], 53 daughter in [place]) ?Employment: system admin ?Education:   ?Last PCP / ongoing medical evaluation:  ? ? ?Visit Diagnosis:  ?  ICD-10-CM   ?1. MDD (major depressive disorder), recurrent episode, mild ([place])  F33.0   ?  ?2. Fatigue, unspecified type  R53.83   ?  ? ? ?Past Psychiatric History: Please see initial evaluation for full details. I have reviewed the history. No updates at this time.  ? ? ?Past Medical History:  ?Past Medical History:  ?Diagnosis Date  ? Cancer [facility])   ? Complication of anesthesia   ? tends to come out of anesthesia during procedure  ? Depression   ? Dysphagia   ? Elevated lipids   ? H/O blood clots   ? superficial blood clot in left leg below knee tx w/ eliquis x 3 mo  ? History of kidney stones   ? Neuroendocrine tumor   ? Tobacco abuse   ?  ?Past Surgical History:  ?Procedure Laterality Date  ? ABDOMINAL SURGERY    ? 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 CHOLECYSTECTOMY    ? added umbilical mesh  ?  COLONOSCOPY WITH PROPOFOL N/A 03/27/2017  ? Procedure: COLONOSCOPY WITH PROPOFOL;  Surgeon: Manya Silvas, MD;  Location: [facility] ENDOSCOPY;  Service: Endoscopy;  Laterality: N/A;  ? ESOPHAGOGASTRODUODENOSCOPY (EGD) WITH PROPOFOL N/A 03/27/2017  ? Procedure: ESOPHAGOGASTRODUODENOSCOPY (EGD) WITH PROPOFOL;  Surgeon: Manya Silvas, MD;  Location: [facility] ENDOSCOPY;  Service: Endoscopy;  Laterality: N/A;  ? hip fracture with rod placement Right   ? MASS EXCISION Left 05/24/2021  ? Procedure: EXCISION MASS;  Surgeon: Benjamine Sprague, DO;  Location: [facility] ORS;  Service: General;  Laterality: Left;  Prone postition  ? ? ?Family Psychiatric History: Please see initial evaluation for full  details. I have reviewed the history. No updates at this time.  ?  ? ?Family History:  ?Family History  ?Problem Relation Age of Onset  ? Depression Mother   ? Depression Father   ? Depression Sister   ? Depression Sister   ? Depression Brother   ? ? ?Social History:  ?Social History  ? ?Socioeconomic History  ? Marital status: Married  ?  Spouse name: Not on file  ? Number of children: 2  ? Years of education: Not on file  ? Highest education level: Associate degree: occupational, Hotel manager, or vocational program  ?Occupational History  ? Not on file  ?Tobacco Use  ? Smoking status: Former  ?  Packs/day: 1.00  ?  Years: 45.00  ?  Pack years: 45.00  ?  Types: Cigarettes  ?  Quit date: 01/30/2019  ?  Years since quitting: 2.7  ? Smokeless tobacco: Never  ?Vaping Use  ? Vaping Use: Former  ?Substance and Sexual Activity  ? Alcohol use: No  ? Drug use: No  ? Sexual activity: Yes  ?  Birth control/protection: None  ?Other Topics Concern  ? Not on file  ?Social History Narrative  ? Not on file  ? ?Social Determinants of Health  ? ?Financial Resource Strain: Not on file  ?Food Insecurity: Not on file  ?Transportation Needs: Not on file  ?Physical Activity: Not on file  ?Stress: Not on file  ?Social Connections: Not on file  ? ? ?Allergies: No Known Allergies ? ?Metabolic Disorder Labs: ?No results found for: HGBA1C, MPG ?No results found for: PROLACTIN ?No results found for: CHOL, TRIG, HDL, CHOLHDL, VLDL, LDLCALC ?No results found for: TSH ? ?Component 10/07/21 12/12/19  ?Thyroid Stimulating Hormone (TSH) 0.545 0.519  ? ? ?Therapeutic Level Labs: ?No results found for: LITHIUM ?No results found for: VALPROATE ?No components found for:  CBMZ ? ?Current Medications: ?Current Outpatient Medications  ?Medication Sig Dispense Refill  ? buPROPion (WELLBUTRIN XL) 300 MG 24 hr tablet Take 1 tablet (300 mg total) by mouth daily. 30 tablet 1  ? escitalopram (LEXAPRO) 20 MG tablet Take 20 mg by mouth daily.    ? ibuprofen  (ADVIL,MOTRIN) 200 MG tablet Take 400 mg by mouth every 6 (six) hours as needed for mild pain.    ? melatonin 5 MG TABS Take 5 mg by mouth.    ? methocarbamol (ROBAXIN) 500 MG tablet Take 250 mg by mouth at bedtime as needed for muscle spasms.    ? Multiple Vitamins-Minerals (CENTRUM SILVER 50+MEN PO) Take 1 tablet by mouth daily.    ? tadalafil (CIALIS) 10 MG tablet Take 0.5 tablets (5 mg total) by mouth daily as needed for erectile dysfunction. 30 tablet 11  ? ?No current facility-administered medications for this visit.  ? ? ? ?Musculoskeletal: ?Strength & Muscle Tone:  N/A ?Gait & Station:  N/A ?Patient leans: N/A ? ?Psychiatric Specialty Exam: ?Review of Systems  ?Psychiatric/Behavioral:  Positive for dysphoric mood. Negative for agitation, behavioral problems, confusion, decreased concentration, hallucinations, self-injury, sleep disturbance and suicidal ideas. The patient is not nervous/anxious and is not hyperactive.   ?All other systems reviewed and are negative.  ?There were no vitals taken for this visit.There is no height or weight on file to calculate BMI.  ?General Appearance: Fairly Groomed  ?Eye Contact:  Good  ?Speech:  Clear and Coherent  ?Volume:  Normal  ?Mood:   better  ?Affect:  Appropriate, Congruent, and calm  ?Thought Process:  Coherent  ?Orientation:  Full (Time, Place, and Person)  ?Thought Content: Logical   ?Suicidal Thoughts:  No  ?Homicidal Thoughts:  No  ?Memory:  Immediate;   Good  ?Judgement:  Good  ?Insight:  Good  ?Psychomotor Activity:  Normal  ?Concentration:  Concentration: Good and Attention Span: Good  ?Recall:  Good  ?Fund of Knowledge: Good  ?Language: Good  ?Akathisia:  No  ?Handed:  Right  ?AIMS (if indicated): not done  ?Assets:  Communication Skills ?Desire for Improvement  ?ADL's:  Intact  ?Cognition: WNL  ?Sleep:  Good  ? ?Screenings: ?PHQ2-9   ? ?Flowsheet Row Counselor from 09/18/2021 in [facility] Office Visit from 08/19/2021 in [facility]  ?PHQ-2 Total Score 4 4  ?PHQ-9 Total Score 12 14  ? ?  ? ?Flowsheet Row Counselor from 09/18/2021 in [facility] Office Visit from 08/19/2021 in [facility]

## 2021-10-14 ENCOUNTER — Other Ambulatory Visit: Payer: Self-pay

## 2021-10-14 ENCOUNTER — Telehealth (INDEPENDENT_AMBULATORY_CARE_PROVIDER_SITE_OTHER): Payer: BC Managed Care – PPO | Admitting: Psychiatry

## 2021-10-14 ENCOUNTER — Encounter: Payer: Self-pay | Admitting: Psychiatry

## 2021-10-14 DIAGNOSIS — R5383 Other fatigue: Secondary | ICD-10-CM

## 2021-10-14 DIAGNOSIS — F330 Major depressive disorder, recurrent, mild: Secondary | ICD-10-CM | POA: Diagnosis not present

## 2021-10-14 MED ORDER — BUPROPION HCL ER (XL) 300 MG PO TB24: 300.0000 mg | ORAL_TABLET | Freq: Every day | ORAL | 1 refills | Status: DC

## 2021-10-14 NOTE — Patient Instructions (Signed)
Increase bupropion 300 mg daily  ?Continue escitalopram 20 mg daily ?Next appointment: 5/15 at 4 PM, video ?

## 2021-10-25 ENCOUNTER — Other Ambulatory Visit: Payer: Self-pay | Admitting: Psychiatry

## 2021-10-31 ENCOUNTER — Ambulatory Visit: Payer: BC Managed Care – PPO | Admitting: Licensed Clinical Social Worker

## 2021-11-08 ENCOUNTER — Telehealth: Payer: Self-pay

## 2021-11-08 NOTE — Telephone Encounter (Signed)
Lm for patient to ask if he has had previous sleep study.  

## 2021-11-11 ENCOUNTER — Telehealth: Payer: Self-pay | Admitting: Primary Care

## 2021-11-11 NOTE — Telephone Encounter (Signed)
Noted.  ?This will be addressed at appt.  ?

## 2021-11-11 NOTE — Telephone Encounter (Signed)
Lm for patient to ask if he currently wears cpap.  ?If so, can he bring SD card to appt? ?

## 2021-11-11 NOTE — Telephone Encounter (Signed)
Lm x2 for patient.  Will close encounter per office protocol.   

## 2021-11-12 ENCOUNTER — Institutional Professional Consult (permissible substitution): Payer: BC Managed Care – PPO | Admitting: Adult Health

## 2021-11-12 ENCOUNTER — Ambulatory Visit (INDEPENDENT_AMBULATORY_CARE_PROVIDER_SITE_OTHER): Payer: BC Managed Care – PPO | Admitting: Licensed Clinical Social Worker

## 2021-11-12 ENCOUNTER — Telehealth: Payer: Self-pay | Admitting: Primary Care

## 2021-11-12 DIAGNOSIS — F330 Major depressive disorder, recurrent, mild: Secondary | ICD-10-CM

## 2021-11-12 NOTE — Telephone Encounter (Signed)
Spoke to patient. He stated that he has not had previous sleep study.  ?

## 2021-11-12 NOTE — Plan of Care (Signed)
?  Problem: Depression CCP Problem  1 Decrease depressive symptoms and improve levels of effective functioning-pt reports a decrease in overall depression symptoms 3 out of 5 sessions documented.  ?Goal: LTG: Reduce frequency, intensity, and duration of depression symptoms as evidenced by: pt self report 3 out of 5 sessions documented ?Outcome: Not Progressing ?Goal: STG: Jared [name] "Jared [name]" WILL PARTICIPATE IN AT LEAST 80% OF SCHEDULED INDIVIDUAL PSYCHOTHERAPY SESSIONS ?Outcome: Progressing ?  ?

## 2021-11-12 NOTE — Telephone Encounter (Signed)
Lm x2 for patient.  Will close encounter per office protocol.   

## 2021-11-12 NOTE — Progress Notes (Signed)
Virtual Visit via Video Note ? ?I connected with Jared [name] on 11/12/21 at  1:00 PM EDT by a video enabled telemedicine application and verified that I am speaking with the correct person using two identifiers. ? ?Location: ?Patient: home ?Provider: remote office [place], [place]) ?  ?I discussed the limitations of evaluation and management by telemedicine and the availability of in person appointments. The patient expressed understanding and agreed to proceed. ?  ?I discussed the assessment and treatment plan with the patient. The patient was provided an opportunity to ask questions and all were answered. The patient agreed with the plan and demonstrated an understanding of the instructions. ?  ?The patient was advised to call back or seek an in-person evaluation if the symptoms worsen or if the condition fails to improve as anticipated. ? ?I provided 40 minutes of non-face-to-face time during this encounter. ? ? ?Terris Bodin R Charlise Giovanetti, LCSW ? ? ?THERAPIST PROGRESS NOTE ? ?Session Time: 1-140p ? ?Participation Level: Active ? ?Behavioral Response: Neat and Well GroomedAlertDepressed ? ?Type of Therapy: Individual Therapy ? ?Treatment Goals addressed: Problem: Depression CCP Problem  1 Decrease depressive symptoms and improve levels of effective functioning-pt reports a decrease in overall depression symptoms 3 out of 5 sessions documented.  ?Goal: LTG: Reduce frequency, intensity, and duration of depression symptoms as evidenced by: pt self report 3 out of 5 sessions documented ?Outcome: Not Progressing ?Goal: STG: Jared Saxon "[name]" WILL PARTICIPATE IN AT LEAST 80% OF SCHEDULED INDIVIDUAL PSYCHOTHERAPY SESSIONS ?Outcome: Progressing ?  ? ?ProgressTowards Goals: Not Progressing ? ?Interventions: CBT ? ?Intervention:Assist with coping skills and behavior ? ?Intervention: Encourage self-care activities ? ?Intervention: REVIEW PLEASE SKILLS (TREAT PHYSICAL ILLNESS, BALANCE EATING, AVOID MOOD-ALTERING  SUBSTANCES, BALANCE SLEEP AND GET EXERCISE) WITH Jared Saxon "Daquarius Dubeau" ? ?Summary: Jared [name] is a 66 y.o. male who presents with continuing symptoms related to depression.  ? ?Allowed pt to explore and express thoughts and feelings associated with recent life situations and external stressors. Patient reports that he's having some up and down days, but feels that he's using his coping skills to help manage down days and stress days. Patient reports that he is still working, and enjoys doing yard work in his spare time period patient reports good quality and quantity of sleep. Patient reports that he is walking more and trying to stay busy. Patient reports that health wise he's doing ok. Patient reports that he is planning a trip in may to [place] and is looking forward to it. Patient reports that he experienced some depression his last trip. Reviewed coping mechanisms to help manage stress and anxiety, and recommended frequent breaks throughout the day. Reviewed and instructed patient on how to have mindfulness breaks throughout the day. Discussed increasing physical activity. ? ?Continued recommendations are as follows: self care behaviors, positive social engagements, focusing on overall work/home/life balance, and focusing on positive physical and emotional wellness.  ? ?Suicidal/Homicidal: No ? ?Therapist Response: Pt is continuing to apply interventions learned in session into daily life situations. Pt is currently on track to meet goals utilizing interventions mentioned above. Personal growth and progress noted. Progress intermittent/fluctuating at time of session. Treatment to continue as indicated.  ? ?Pt recommended to try mindfulness breaks throughout workday, physical activity to best of pts physical ability (chair yoga), grounding exercises to help pt feel more "present", and drink more water.  ? ?Plan: Return again in 4 weeks. ? ?Diagnosis: MDD (major depressive disorder), recurrent  episode, mild ([place]) ? ?Collaboration of Care:  Other pt encouraged to continue care with psychiatrist of record, Dr. Ursula Alert ? ?Patient/Guardian was advised Release of Information must be obtained prior to any record release in order to collaborate their care with an outside provider. Patient/Guardian was advised if they have not already done so to contact the registration department to sign all necessary forms in order for [place] to release information regarding their care.  ? ?Consent: Patient/Guardian gives verbal consent for treatment and assignment of benefits for services provided during this visit. Patient/Guardian expressed understanding and agreed to proceed.  ? ?Elexus Barman R Monya Kozakiewicz, LCSW ?11/12/2021 ? ?

## 2021-11-18 ENCOUNTER — Telehealth: Payer: Self-pay

## 2021-11-18 NOTE — Telephone Encounter (Signed)
Lm for patient for clarification on scheduled sleep study. Who ordered study? ?

## 2021-11-18 NOTE — Telephone Encounter (Signed)
Spoke to patient.  ?He stated that has sleep study scheduled 11/20/2021. PCP ordered this study.  ?He is unsure if PCP will manage CPAP. He would like to have sleep study then follow up with our office after. ?11/20/2021 with Eustaquio Maize has been canceled. He will call back to reschedule after sleep study.  ?Nothing further needed at this time.  ? ?

## 2021-11-20 ENCOUNTER — Institutional Professional Consult (permissible substitution): Payer: BC Managed Care – PPO | Admitting: Primary Care

## 2021-11-30 NOTE — Progress Notes (Deleted)
BH MD/PA/NP OP Progress Note  11/30/2021 5:24 PM COLBE VIVIANO  MRN:  [id number]  Chief Complaint: No chief complaint on file.  HPI: *** Visit Diagnosis: No diagnosis found.  Past Psychiatric History: Please see initial evaluation for full details. I have reviewed the history. No updates at this time.     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   CHOLECYSTECTOMY     added umbilical mesh   COLONOSCOPY WITH PROPOFOL N/A 03/27/2017   Procedure: COLONOSCOPY WITH PROPOFOL;  Surgeon: Manya Silvas, MD;  Location[facility] ENDOSCOPY;  Service: Endoscopy;  Laterality: N/A;   ESOPHAGOGASTRODUODENOSCOPY (EGD) WITH PROPOFOL N/A 03/27/2017   Procedure: ESOPHAGOGASTRODUODENOSCOPY (EGD) WITH PROPOFOL;  Surgeon: Manya Silvas, MD;  Location: [facility] ENDOSCOPY;  Service: Endoscopy;  Laterality: N/A;   hip fracture with rod placement Right    MASS EXCISION Left 05/24/2021   Procedure: EXCISION MASS;  Surgeon: Benjamine Sprague, DO;  Location: [facility] ORS;  Service: General;  Laterality: Left;  Prone postition    Family Psychiatric History: Please see initial evaluation for full details. I have reviewed the history. No updates at this time.     Family History:  Family History  Problem Relation Age of Onset   Depression Mother    Depression Father    Depression Sister    Depression Sister    Depression Brother     Social History:  Social History   Socioeconomic History   Marital status: Married    Spouse name: Not on file   Number of children: 2    Years of education: Not on file   Highest education level: Associate degree: occupational, Hotel manager, or vocational program  Occupational History   Not on file  Tobacco Use   Smoking status: Former    Packs/day: 1.00    Years: 45.00    Pack years: 45.00    Types: Cigarettes    Quit date: 01/30/2019    Years since quitting: 2.8   Smokeless tobacco: Never  Vaping Use   Vaping Use: Former  Substance and Sexual Activity   Alcohol use: No   Drug use: No   Sexual activity: Yes    Birth control/protection: None  Other Topics Concern   Not on file  Social History Narrative   Not on file   Social Determinants of Health   Financial Resource Strain: Not on file  Food Insecurity: Not on file  Transportation Needs: Not on file  Physical Activity: Not on file  Stress: Not on file  Social Connections: Not on file    Allergies: No Known Allergies  Metabolic Disorder Labs: No results found for: HGBA1C, MPG No results found for: PROLACTIN No results found for: CHOL, TRIG, HDL, CHOLHDL, VLDL, LDLCALC No results found for: TSH  Therapeutic Level Labs: No results found for: LITHIUM No results found for: VALPROATE No components found for:  CBMZ  Current Medications: Current Outpatient Medications  Medication Sig Dispense Refill   buPROPion (WELLBUTRIN XL) 300 MG 24 hr tablet Take 1 tablet (300 mg total) by mouth daily. 30 tablet 1   escitalopram (LEXAPRO) 20 MG tablet Take 20 mg by mouth daily.     ibuprofen (ADVIL,MOTRIN) 200 MG tablet Take 400 mg by mouth every 6 (six) hours as needed for mild pain.     melatonin 5 MG TABS Take 5 mg by mouth.     methocarbamol (ROBAXIN) 500 MG tablet Take 250 mg by mouth at bedtime as needed for muscle spasms.     Multiple Vitamins-Minerals (CENTRUM SILVER 50+MEN PO) Take 1 tablet by mouth daily.     tadalafil (CIALIS) 10 MG tablet Take 0.5 tablets (5 mg total) by mouth daily as needed for erectile dysfunction. 30 tablet 11   No current  facility-administered medications for this visit.     Musculoskeletal: Strength & Muscle Tone:  N/A Gait & Station:  N?A Patient leans: N/A  Psychiatric Specialty Exam: Review of Systems  There were no vitals taken for this visit.There is no height or weight on file to calculate BMI.  General Appearance: {Appearance:22683}  Eye Contact:  {BHH EYE CONTACT:22684}  Speech:  Clear and Coherent  Volume:  Normal  Mood:  {BHH MOOD:22306}  Affect:  {Affect (PAA):22687}  Thought Process:  Coherent  Orientation:  Full (Time, Place, and Person)  Thought Content: Logical   Suicidal Thoughts:  {ST/HT (PAA):22692}  Homicidal Thoughts:  {ST/HT (PAA):22692}  Memory:  Immediate;   Good  Judgement:  {Judgement (PAA):22694}  Insight:  {Insight (PAA):22695}  Psychomotor Activity:  Normal  Concentration:  Concentration: Good and Attention Span: Good  Recall:  Good  Fund of Knowledge: Good  Language: Good  Akathisia:  No  Handed:  Right  AIMS (if indicated): not done  Assets:  Communication Skills Desire for Improvement  ADL's:  Intact  Cognition: WNL  Sleep:  {BHH GOOD/FAIR/POOR:22877}   Screenings: Web designer from 09/18/2021 in [facility] Visit from 08/19/2021 in [place]  PHQ-2 Total Score 4 4  PHQ-9 Total Score 12 14      Flowsheet Row Counselor from 09/18/2021 in [facility] Office Visit from 08/19/2021 in [facility] Testing 45 from 05/15/2021 in [facility] No Risk Error: Q3, 4, or 5 should not be populated when Q2 is No No Risk        Assessment and Plan:  CORION SHERROD is a 66 y.o. year old male with a history of depression, arthritis, GERD, pancreatic neuroendocrine tumor s/p excision, who presents for follow up appointment for below.    1. MDD  (major depressive disorder), recurrent episode, mild ([facility]) There has been some improvement in depressive symptoms since starting bupropion.  He reports better relationship with his wife, although he used to struggle with mild alcoholic night.  Will uptitrate bupropion to optimize treatment for depression.  He has no known history of seizure.  Discussed potential risk of headache and palpitation.  Will continue escitalopram to target depression.  Discussed potential risk of QTc prolongation.  He will benefit from the current dose given he has not had QTc prolongation despite bradycardia.  He will continue to see a therapist.    2. Fatigue, unspecified type He has an upcoming appointment for sleep evaluation due to snoring, and significant fatigue.    # cognitive impairment  He reports occasional difficulty in remembering names of people.  IADL independent.  Noted that he was evaluated by his neurologist symptoms in 2021 with brain MRI, and labs, which are unremarkable to explain his symptoms.  Will continue to monitor this.    Plan Increase bupropion 300 mg daily  Continue escitalopram 20 mg daily (QTc 412 msec in 04/2021, HR 52 sinus bradycardia) Next appointment: 5/15 at 4 PM for 30 mins, video [email or web address] Referred for evaluation of sleep   The patient demonstrates the following risk factors for suicide: Chronic risk factors for suicide include: psychiatric disorder of depression . Acute risk factors for suicide include: family or marital conflict. Protective factors for this patient include: hope for the future and religious beliefs against suicide. Considering these factors, the overall suicide risk at this point appears to be low. Patient is appropriate for outpatient follow up.          Collaboration of Care: Collaboration of Care: {BH OP Collaboration of Care:21014065}  Patient/Guardian was advised Release of Information must be obtained prior to any record release in order to  collaborate their care with an outside provider. Patient/Guardian was advised if they have not already done so to contact the registration department to sign all necessary forms in order for [place] to release information regarding their care.   Consent: Patient/Guardian gives verbal consent for treatment and assignment of benefits for services provided during this visit. Patient/Guardian expressed understanding and agreed to proceed.    Norman Clay, MD 11/30/2021, 5:24 PM

## 2021-12-02 ENCOUNTER — Telehealth: Payer: Self-pay | Admitting: Psychiatry

## 2021-12-02 ENCOUNTER — Telehealth: Payer: Medicare Other | Admitting: Psychiatry

## 2021-12-02 NOTE — Telephone Encounter (Signed)
Sent link for video visit through Epic. Patient did not sign in. Called the patient for appointment scheduled today. The patient did not answer the phone. Left voice message to contact the office ([phone]).   ?

## 2021-12-04 NOTE — Progress Notes (Signed)
Virtual Visit via Video Note  I connected with Jared [name] on 12/06/21 at  8:00 AM EDT by a video enabled telemedicine application and verified that I am speaking with the correct person using two identifiers.  Location: Patient: work Provider: office Persons participated in the visit- patient, provider    I discussed the limitations of evaluation and management by telemedicine and the availability of in person appointments. The patient expressed understanding and agreed to proceed.    I discussed the assessment and treatment plan with the patient. The patient was provided an opportunity to ask questions and all were answered. The patient agreed with the plan and demonstrated an understanding of the instructions.   The patient was advised to call back or seek an in-person evaluation if the symptoms worsen or if the condition fails to improve as anticipated.  I provided 16 minutes of non-face-to-face time during this encounter.   Norman Clay, MD     [facility] MD/PA/NP OP Progress Note  12/06/2021 8:29 AM Jared [name]  MRN:  [id number]  Chief Complaint:  Chief Complaint  Patient presents with   Follow-up   Depression   HPI:  This is a follow-up appointment for depression.  He states that he has started to see a therapist, which has been going well.  There are some days he does not feel so well.  He notices that he starts to feel depressed when he is at work, although it is more manageable.  He feels blues, and feels tired of depression.  He thinks he has most drive to do things, although he used to be able to do things naturally.  He also finds it difficult for him to troubleshoot, which is required to do at work.  However, he does not feel trying anymore to do yard grass.  Although he did gain 30 pounds since last summer, he hopes to lose his weight around summer again with more activities.  He has occasional insomnia.  He will have an upcoming sleep evaluation.  He denies  change in appetite.  He denies SI.  He denies anxiety.  He denies alcohol use or drug use.  He is willing to try uptitration of bupropion at this time.    Wt Readings from Last 3 Encounters:  10/09/21 234 lb (106.1 kg)  08/19/21 235 lb (106.6 kg)  05/24/21 227 lb (103 kg)     Daily routine: work Support: Household: wife, mother in [employer] Marital status: married for 29 years Number of children: 2 (54 son in [place], 72 daughter in [place]) Employment: system admin Education:   Last PCP / ongoing medical evaluation  Visit Diagnosis:    ICD-10-CM   1. MDD (major depressive disorder), recurrent episode, mild (Jared [name])  F33.0     2. Fatigue, unspecified type  R53.83       Past Psychiatric History: Please see initial evaluation for full details. I have reviewed the history. No updates at this time.     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   CHOLECYSTECTOMY     added umbilical mesh   COLONOSCOPY WITH PROPOFOL N/A 03/27/2017   Procedure: COLONOSCOPY WITH PROPOFOL;  Surgeon: Manya Silvas, MD;  [facility] ENDOSCOPY;  Service: Endoscopy;  Laterality: N/A;   ESOPHAGOGASTRODUODENOSCOPY (EGD) WITH PROPOFOL N/A 03/27/2017   Procedure: ESOPHAGOGASTRODUODENOSCOPY (EGD) WITH PROPOFOL;  Surgeon: Manya Silvas, MD;  Location: [facility] ENDOSCOPY;  Service: Endoscopy;  Laterality: N/A;   hip fracture with rod placement Right    MASS EXCISION Left 05/24/2021   Procedure: EXCISION MASS;  Surgeon: Benjamine Sprague, DO;  Location: [facility] ORS;  Service:  General;  Laterality: Left;  Prone postition    Family Psychiatric History: Please see initial evaluation for full details. I have reviewed the history. No updates at this time.     Family History:  Family History  Problem Relation Age of Onset   Depression Mother    Depression Father    Depression Sister    Depression Sister    Depression Brother     Social History:  Social History   Socioeconomic History   Marital status: Married    Spouse name: Not on file   Number of children: 2   Years of education: Not on file   Highest education level: Associate degree: occupational, Hotel manager, or vocational program  Occupational History   Not on file  Tobacco Use   Smoking status: Former    Packs/day: 1.00    Years: 45.00    Pack years: 45.00    Types: Cigarettes    Quit date: 01/30/2019    Years since quitting: 2.8   Smokeless tobacco: Never  Vaping Use   Vaping Use: Former  Substance and Sexual Activity   Alcohol use: No   Drug use: No   Sexual activity: Yes    Birth control/protection: None  Other Topics Concern   Not on file  Social History Narrative   Not on file   Social Determinants of Health   Financial Resource Strain: Not on file  Food Insecurity: Not on file  Transportation Needs: Not on file  Physical Activity: Not on file  Stress: Not on file  Social Connections: Not on file    Allergies: No Known Allergies  Metabolic Disorder Labs: No results found for: HGBA1C, MPG No results found for: PROLACTIN No results found for: CHOL, TRIG, HDL, CHOLHDL, VLDL, LDLCALC No results found for: TSH  Therapeutic Level Labs: No results found for: LITHIUM No results found for: VALPROATE No components found for:  CBMZ  Current Medications: Current Outpatient Medications  Medication Sig Dispense Refill   buPROPion (WELLBUTRIN XL) 300 MG 24 hr tablet Take 1 tablet (300 mg total) by mouth daily. 30 tablet 1   escitalopram (LEXAPRO) 20 MG tablet Take 20 mg by  mouth daily.     ibuprofen (ADVIL,MOTRIN) 200 MG tablet Take 400 mg by mouth every 6 (six) hours as needed for mild pain.     melatonin 5 MG TABS Take 5 mg by mouth.     methocarbamol (ROBAXIN) 500 MG tablet Take 250 mg by mouth at bedtime as needed for muscle spasms.     Multiple Vitamins-Minerals (CENTRUM SILVER 50+MEN PO) Take 1 tablet by mouth daily.     tadalafil (CIALIS) 10 MG tablet Take 0.5 tablets (5 mg total) by mouth daily as needed for erectile dysfunction. 30 tablet 11   No current facility-administered medications  for this visit.     Musculoskeletal: Strength & Muscle Tone:  N/A Gait & Station:  N/A Patient leans: N/A  Psychiatric Specialty Exam: Review of Systems  Psychiatric/Behavioral:  Positive for decreased concentration, dysphoric mood and sleep disturbance. Negative for agitation, behavioral problems, confusion, hallucinations, self-injury and suicidal ideas. The patient is not nervous/anxious and is not hyperactive.   All other systems reviewed and are negative.  There were no vitals taken for this visit.There is no height or weight on file to calculate BMI.  General Appearance: Fairly Groomed  Eye Contact:  Good  Speech:  Clear and Coherent  Volume:  Normal  Mood:   better  Affect:  Appropriate, Congruent, and euthymic  Thought Process:  Coherent  Orientation:  Full (Time, Place, and Person)  Thought Content: Logical   Suicidal Thoughts:  No  Homicidal Thoughts:  No  Memory:  Immediate;   Good  Judgement:  Good  Insight:  Good  Psychomotor Activity:  Normal  Concentration:  Concentration: Good and Attention Span: Good  Recall:  Good  Fund of Knowledge: Good  Language: Good  Akathisia:  No  Handed:  Right  AIMS (if indicated): not done  Assets:  Communication Skills Desire for Improvement  ADL's:  Intact  Cognition: WNL  Sleep:  Fair   Screenings: Web designer from 09/18/2021 in [place]  Office Visit from 08/19/2021 in [place]  PHQ-2 Total Score 4 4  PHQ-9 Total Score 12 14      Flowsheet Row Counselor from 09/18/2021 in [place] Office Visit from 08/19/2021 in [place] [facility] from 05/15/2021 in [place] No Risk Error: Q3, 4, or 5 should not be populated when Q2 is No No Risk        Assessment and Plan:  Jared [name] is a 66 y.o. year old male with a history of  depression, arthritis, GERD, pancreatic neuroendocrine tumor s/p excision, who presents for follow up appointment for below.   1. MDD (major depressive disorder), recurrent episode, mild ([place]) Although there has been overall improvement in depressive symptoms since uptitration bupropion, he continues to struggle with anhedonia/fatigue.  Will do further uptitration of bupropion to optimize treatment for depression. He has no known history of seizure.  Will continue Lexapro to target depression.   2. Fatigue, unspecified type Unchanged.  He will have home sleep test for evaluation of sleep apnea.    # cognitive impairment Unchanged. He reports occasional difficulty in remembering names of people.  IADL independent.  Noted that he was evaluated by his neurologist symptoms in 2021 with brain MRI, and labs, which are unremarkable to explain his symptoms.  Will continue to monitor this.    Plan Increase bupropion 450 mg daily  Continue escitalopram 20 mg daily (QTc 412 msec in 04/2021, HR 52 sinus bradycardia)- declined refills Next appointment: 6/28 at 11:30 for 30 mins, video [email or web address] Referred for evaluation of sleep   The patient demonstrates the following risk factors for suicide: Chronic risk factors for suicide include: psychiatric disorder of depression . Acute risk factors for suicide include: family or marital  conflict. Protective factors for this patient include: hope for the future and religious beliefs against suicide. Considering these factors, the overall suicide risk at this point appears to be low. Patient is appropriate for outpatient follow up.  Collaboration of Care: Collaboration of Care: Other reviewing PCP note  Patient/Guardian was advised Release of Information must be obtained prior to any record release in order to collaborate their care with an outside provider. Patient/Guardian was advised if they have not already done so to contact the registration department to sign all necessary forms in order for [place] to release information regarding their care.   Consent: Patient/Guardian gives verbal consent for treatment and assignment of benefits for services provided during this visit. Patient/Guardian expressed understanding and agreed to proceed.    Norman Clay, MD 12/06/2021, 8:29 AM

## 2021-12-06 ENCOUNTER — Telehealth (INDEPENDENT_AMBULATORY_CARE_PROVIDER_SITE_OTHER): Payer: BC Managed Care – PPO | Admitting: Psychiatry

## 2021-12-06 ENCOUNTER — Encounter: Payer: Self-pay | Admitting: Psychiatry

## 2021-12-06 DIAGNOSIS — R5383 Other fatigue: Secondary | ICD-10-CM | POA: Diagnosis not present

## 2021-12-06 DIAGNOSIS — F330 Major depressive disorder, recurrent, mild: Secondary | ICD-10-CM | POA: Diagnosis not present

## 2021-12-06 NOTE — Patient Instructions (Signed)
Increase bupropion 450 mg daily  Continue escitalopram 20 mg daily  Next appointment: 6/28 at 11:30, video

## 2021-12-11 ENCOUNTER — Other Ambulatory Visit: Payer: Self-pay | Admitting: Psychiatry

## 2021-12-12 ENCOUNTER — Other Ambulatory Visit: Payer: Self-pay | Admitting: Psychiatry

## 2021-12-12 MED ORDER — BUPROPION HCL ER (XL) 150 MG PO TB24: 450.0000 mg | ORAL_TABLET | Freq: Every day | ORAL | 1 refills | Status: DC

## 2021-12-13 ENCOUNTER — Telehealth: Payer: Self-pay | Admitting: *Deleted

## 2021-12-13 NOTE — Telephone Encounter (Signed)
LMTC to schedule Yearly Lung CA CT Scan. 

## 2022-01-01 ENCOUNTER — Other Ambulatory Visit (INDEPENDENT_AMBULATORY_CARE_PROVIDER_SITE_OTHER): Payer: Self-pay | Admitting: Vascular Surgery

## 2022-01-01 ENCOUNTER — Ambulatory Visit (INDEPENDENT_AMBULATORY_CARE_PROVIDER_SITE_OTHER): Payer: BC Managed Care – PPO | Admitting: Licensed Clinical Social Worker

## 2022-01-01 DIAGNOSIS — F330 Major depressive disorder, recurrent, mild: Secondary | ICD-10-CM | POA: Diagnosis not present

## 2022-01-01 DIAGNOSIS — I714 Abdominal aortic aneurysm, without rupture, unspecified: Secondary | ICD-10-CM

## 2022-01-01 NOTE — Progress Notes (Signed)
Virtual Visit via Video Note  I connected with Jared [name] on 01/01/22 at  2:00 PM EDT by a video enabled telemedicine application and verified that I am speaking with the correct person using two identifiers.  Location: Patient: home Provider: [facility]   I discussed the limitations of evaluation and management by telemedicine and the availability of in person appointments. The patient expressed understanding and agreed to proceed.   I discussed the assessment and treatment plan with the patient. The patient was provided an opportunity to ask questions and all were answered. The patient agreed with the plan and demonstrated an understanding of the instructions.   The patient was advised to call back or seek an in-person evaluation if the symptoms worsen or if the condition fails to improve as anticipated.  I provided 35 minutes of non-face-to-face time during this encounter.   Jared Benham R Percell Lamboy, LCSW   THERAPIST PROGRESS NOTE  Session Time: 1-135p  Participation Level: Active  Behavioral Response: Neat and Well GroomedAlertDepressed  Type of Therapy: Individual Therapy  Treatment Goals addressed: Problem: Depression CCP Problem  1 Decrease depressive symptoms and improve levels of effective functioning-pt reports a decrease in overall depression symptoms 3 out of 5 sessions documented.  Goal: LTG: Reduce frequency, intensity, and duration of depression symptoms as evidenced by: pt self report 3 out of 5 sessions documented Outcome: Progressing Goal: STG: Jared [name] "Jared [name]" WILL PARTICIPATE IN AT LEAST 80% OF SCHEDULED INDIVIDUAL PSYCHOTHERAPY SESSIONS Outcome: Progressing Intervention: REVIEW PLEASE SKILLS (TREAT PHYSICAL ILLNESS, BALANCE EATING, AVOID MOOD-ALTERING SUBSTANCES, BALANCE SLEEP AND GET EXERCISE) WITH Jared [name] "Jared [name]" Note: Reviewed  Intervention: Encourage self-care activities Note: Reviewed  Intervention: Encourage verbalization of  feelings/concerns/expectations Note: Allowed/explored  ProgressTowards Goals: Not Progressing  Interventions: CBT  Summary: Jared [name] is a 66 y.o. male who presents with continuing symptoms related to depression. Pt reports a recent increase in buproprion, which pt feels has given him a bit more energy. Pt reports good quality and quantity of sleep.   Allowed pt to explore and express thoughts and feelings associated with recent life situations and external stressors. Patient reports continuing stress relating to ongoing family conflict. Patient reports that his son has decided that he wants a time of estrangement, and patient is allowing time and space for his son to figure things out. Patient reports that he feels stress associated with his wife's reaction to this situation involving his son. Patient reports that he does not personally have a lot of stress associated with it, but he recognizes that his wife does. Patient reports that makes him feel helpless.  Patient reports that he recently purchased a home, and should be closing and moving in the next two weeks. Patient reports that he feels excited about this. Patient reports that his mother-in-law is currently living with them, and patient states that this is a good situation for everyone.  Patient reports that overall work environment is still OK and that he's having no concerns related to work in the working environment.  Reviewed coping skills for managing depression and anxiety symptoms. Patient reports that he is trying hard to incorporate these coping skills into his daily routine and is frequently taking breaks at work. Praised patient's cooperation and encourage patient to continue using the coping skills.  Continued recommendations are as follows: self care behaviors, positive social engagements, focusing on overall work/home/life balance, and focusing on positive physical and emotional wellness.   Suicidal/Homicidal:  No  Therapist Response: Pt is continuing to  apply interventions learned in session into daily life situations. Pt is currently on track to meet goals utilizing interventions mentioned above. Personal growth and progress noted. Progress intermittent/fluctuating at time of session. Treatment to continue as indicated.   Pt recommended to try mindfulness breaks throughout workday, physical activity to best of pts physical ability (chair yoga), grounding exercises to help pt feel more "present", and drink more water.   Plan: Return again in 4 weeks.  Diagnosis: MDD (major depressive disorder), recurrent episode, mild ([place])  Collaboration of Care: Other pt encouraged to continue care with psychiatrist of record, Dr. Ursula Alert  Patient/Guardian was advised Release of Information must be obtained prior to any record release in order to collaborate their care with an outside provider. Patient/Guardian was advised if they have not already done so to contact the registration department to sign all necessary forms in order for [place] to release information regarding their care.   Consent: Patient/Guardian gives verbal consent for treatment and assignment of benefits for services provided during this visit. Patient/Guardian expressed understanding and agreed to proceed.   [place], LCSW 01/01/2022

## 2022-01-02 ENCOUNTER — Ambulatory Visit (INDEPENDENT_AMBULATORY_CARE_PROVIDER_SITE_OTHER): Payer: BC Managed Care – PPO | Admitting: Vascular Surgery

## 2022-01-02 ENCOUNTER — Encounter (INDEPENDENT_AMBULATORY_CARE_PROVIDER_SITE_OTHER): Payer: Self-pay | Admitting: Vascular Surgery

## 2022-01-02 ENCOUNTER — Ambulatory Visit (INDEPENDENT_AMBULATORY_CARE_PROVIDER_SITE_OTHER): Payer: BC Managed Care – PPO

## 2022-01-02 VITALS — BP 145/84 | HR 60 | Resp 16 | Wt 235.0 lb

## 2022-01-02 DIAGNOSIS — I7143 Infrarenal abdominal aortic aneurysm, without rupture: Secondary | ICD-10-CM | POA: Diagnosis not present

## 2022-01-02 DIAGNOSIS — I8393 Asymptomatic varicose veins of bilateral lower extremities: Secondary | ICD-10-CM

## 2022-01-02 DIAGNOSIS — I714 Abdominal aortic aneurysm, without rupture, unspecified: Secondary | ICD-10-CM

## 2022-01-02 DIAGNOSIS — E785 Hyperlipidemia, unspecified: Secondary | ICD-10-CM

## 2022-01-02 NOTE — Plan of Care (Signed)
  Problem: Depression CCP Problem  1 Decrease depressive symptoms and improve levels of effective functioning-pt reports a decrease in overall depression symptoms 3 out of 5 sessions documented.  Goal: LTG: Reduce frequency, intensity, and duration of depression symptoms as evidenced by: pt self report 3 out of 5 sessions documented Outcome: Progressing Goal: STG: Jared [name] "[name]" WILL PARTICIPATE IN AT LEAST 80% OF SCHEDULED INDIVIDUAL PSYCHOTHERAPY SESSIONS Outcome: Progressing Intervention: REVIEW PLEASE SKILLS (TREAT PHYSICAL ILLNESS, BALANCE EATING, AVOID MOOD-ALTERING SUBSTANCES, BALANCE SLEEP AND GET EXERCISE) WITH Jared [name] "[name]" Note: Reviewed  Intervention: Encourage self-care activities Note: Reviewed  Intervention: Encourage verbalization of feelings/concerns/expectations Note: Allowed/explored

## 2022-01-02 NOTE — Progress Notes (Signed)
MRN : [id number]  Jared [name] is a 66 y.o. (02/05/1956) male who presents with chief complaint of check circulation.  History of Present Illness:   The patient returns to the office for surveillance of a known abdominal aortic aneurysm. Patient denies abdominal pain or back pain, no other abdominal complaints. No changes suggesting embolic episodes.   There have been no interval changes in the patient's overall health care since his last visit.  Patient denies amaurosis fugax or TIA symptoms. There is no history of claudication or rest pain symptoms of the lower extremities. The patient denies angina or shortness of breath.   Duplex US of the aorta and iliac arteries shows an AAA measured 3.67 cm.   Current Meds  Medication Sig   buPROPion (WELLBUTRIN XL) 150 MG 24 hr tablet Take 3 tablets (450 mg total) by mouth daily.   escitalopram (LEXAPRO) 20 MG tablet Take 20 mg by mouth daily.   ibuprofen (ADVIL,MOTRIN) 200 MG tablet Take 400 mg by mouth every 6 (six) hours as needed for mild pain.   melatonin 5 MG TABS Take 5 mg by mouth.   methocarbamol (ROBAXIN) 500 MG tablet Take 250 mg by mouth at bedtime as needed for muscle spasms.   Multiple Vitamins-Minerals (CENTRUM SILVER 50+MEN PO) Take 1 tablet by mouth daily.   tadalafil (CIALIS) 10 MG tablet Take 0.5 tablets (5 mg total) by mouth daily as needed for erectile dysfunction.    Past Medical History:  Diagnosis Date   Cancer ([place])    Complication of anesthesia    tends to come out of anesthesia during procedure   Depression    Dysphagia    Elevated lipids    H/O blood clots    superficial blood clot in left leg below knee tx w/ eliquis x 3 mo   History of kidney stones    Neuroendocrine tumor    Tobacco abuse     Past Surgical History:  Procedure Laterality Date   ABDOMINAL SURGERY     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   CHOLECYSTECTOMY     added umbilical mesh   COLONOSCOPY WITH PROPOFOL N/A  03/27/2017   Procedure: COLONOSCOPY WITH PROPOFOL;  Surgeon: Manya Silvas, MD;  Location: [facility] ENDOSCOPY;  Service: Endoscopy;  Laterality: N/A;   ESOPHAGOGASTRODUODENOSCOPY (EGD) WITH PROPOFOL N/A 03/27/2017   Procedure: ESOPHAGOGASTRODUODENOSCOPY (EGD) WITH PROPOFOL;  Surgeon: Manya Silvas, MD;  Location: [facility] ENDOSCOPY;  Service: Endoscopy;  Laterality: N/A;   hip fracture with rod placement Right    MASS EXCISION Left 05/24/2021   Procedure: EXCISION MASS;  Surgeon: Benjamine Sprague, DO;  Location: [facility] ORS;  Service: General;  Laterality: Left;  Prone postition    Social History Social History   Tobacco Use   Smoking status: Former    Packs/day: 1.00    Years: 45.00    Total pack years: 45.00    Types: Cigarettes    Quit date: 01/30/2019    Years since quitting: 2.9   Smokeless tobacco: Never  Vaping Use   Vaping Use: Former  Substance Use Topics   Alcohol use: No   Drug use: No    Family History Family History  Problem Relation Age of Onset   Depression Mother    Depression Father    Depression Sister    Depression Sister    Depression Brother     No Known Allergies   REVIEW OF SYSTEMS (Negative unless checked)  Constitutional: _0 Weight loss  _1   Fever  _0 Chills Cardiac: _1 Chest pain   _2 Chest pressure   _3 Palpitations   _4 Shortness of breath when laying flat   _5 Shortness of breath with exertion. Vascular:  _6 Pain in legs with walking   _7 Pain in legs at rest  _8 History of DVT   _9 Phlebitis   _10 Swelling in legs   _11 Varicose veins   _12 Non-healing ulcers Pulmonary:   _13 Uses home oxygen   _14 Productive cough   _15 Hemoptysis   _16 Wheeze  _17 COPD   _18 Asthma Neurologic:  _19 Dizziness   _20 Seizures   _21 History of stroke   _22 History of TIA  _23 Aphasia   _24 Vissual changes   _25 Weakness or numbness in arm   _26 Weakness or numbness in leg Musculoskeletal:   _27 Joint swelling   _28 Joint pain   _29 Low back pain Hematologic:  _30 Easy bruising  _31 Easy bleeding   _32 Hypercoagulable state    _33 Anemic Gastrointestinal:  _34 Diarrhea   _35 Vomiting  _36 Gastroesophageal reflux/heartburn   _37 Difficulty swallowing. Genitourinary:  _38 Chronic kidney disease   _39 Difficult urination  _40 Frequent urination   _41 Blood in urine Skin:  _42 Rashes   _43 Ulcers  Psychological:  _44 History of anxiety   _45  History of major depression.  Physical Examination  Vitals:   01/02/22 0953  BP: (!) 145/84  Pulse: 60  Resp: 16  Weight: 235 lb (106.6 kg)   Body mass index is 27.16 kg/m. Gen: WD/WN, NAD Head: [place]/AT, No temporalis wasting.  Ear/Nose/Throat: Hearing grossly intact, nares w/o erythema or drainage Eyes: PER, EOMI, sclera nonicteric.  Neck: Supple, no masses.  No bruit or JVD.  Pulmonary:  Good air movement, no audible wheezing, no use of accessory muscles.  Cardiac: RRR, normal S1, S2, no Murmurs. Vascular:  mild trophic changes, no open wounds Vessel Right Left  Radial Palpable Palpable  PT  Palpable  Palpable  DP  Palpable  Palpable  Gastrointestinal: soft, non-distended. No guarding/no peritoneal signs.  Musculoskeletal: M/S 5/5 throughout.  No visible deformity.  Neurologic: CN 2-12 intact. Pain and light touch intact in extremities.  Symmetrical.  Speech is fluent. Motor exam as listed above. Psychiatric: Judgment intact, Mood & affect appropriate for pt's clinical situation. Dermatologic: No rashes or ulcers noted.  No changes consistent with cellulitis.   CBC Lab Results  Component Value Date   WBC 6.7 05/15/2021   HGB 14.8 05/15/2021   HCT 42.7 05/15/2021   MCV 95.3 05/15/2021   PLT 251 05/15/2021    BMET    Component Value Date/Time   NA 138 08/22/2008 0525   K 4.2 08/22/2008 0525   CL 105 08/22/2008 0525   CO2 28 08/22/2008 0525   GLUCOSE 114 (H) 08/22/2008 0525   BUN 9 08/22/2008 0525   CREATININE 0.90 10/05/2020 1359   CALCIUM 8.5 08/22/2008 0525   GFRNONAA >60 08/22/2008 0525   GFRAA  08/22/2008 0525    >60        The eGFR has been calculated using the  MDRD equation. This calculation has not been validated in all clinical situations. eGFR's persistently <60 mL/min signify possible Chronic Kidney Disease.   CrCl cannot be calculated (Patient's most recent lab result is older than the maximum 21 days allowed.).  COAG No results found for: "INR", "PROTIME"  Radiology No results found.   Assessment/Plan 1. Infrarenal abdominal aortic aneurysm (AAA) without rupture (HCC) Recommend: No surgery or intervention is indicated at this time.  The patient has an asymptomatic abdominal aortic aneurysm that is less than 4 cm in maximal diameter.    I have reviewed the natural  history of abdominal aortic aneurysm and the small risk of rupture for aneurysm less than 5 cm in size.  However, as these small aneurysms tend to enlarge over time, continued surveillance with ultrasound or CT scan is mandatory.   I have also discussed optimizing medical management with hypertension and lipid control and the importance of abstinence from tobacco.  The patient is also encouraged to exercise a minimum of 30 minutes 4 times a week.   Should the patient develop new onset abdominal or back pain or signs of peripheral embolization they are instructed to seek medical attention immediately and to alert the physician providing care that they have an aneurysm.   The patient voices his understanding.  He discussed that he is frequently getting an MRI to follow his past neuroendocrine tumor.  This provides more than enough information to monitor his aneurysm.  He is quite satisfied with my explanation and will contact the office to follow up at his convenience.  I have stressed the fact that the aneurysm needs to be followed at this point on an annual basis any agrees.  The patient will return at his convenience with an aortic duplex.   2. Hyperlipidemia, unspecified hyperlipidemia type Continue statin as ordered and reviewed, no changes at this time   3.  Varicose veins of both lower extremities, unspecified whether complicated Recommend:  The patient is complaining of varicose veins.    I have had a long discussion with the patient regarding  varicose veins and why they cause symptoms.  Patient will begin wearing graduated compression stockings on a daily basis, beginning first thing in the morning and removing them in the evening. The patient is instructed specifically not to sleep in the stockings.    The patient  will also begin using over-the-counter analgesics such as Motrin 600 mg po TID to help control the symptoms as needed.    In addition, behavioral modification including elevation during the day will be initiated, utilizing a recliner was recommended.  The patient is also instructed to continue exercising such as walking 4-5 times per week.  At this time the patient wishes to continue conservative therapy and is not interested in more invasive treatments such as laser ablation and sclerotherapy.     Hortencia Pilar, MD  01/02/2022 11:52 AM
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
## 2022-01-15 ENCOUNTER — Telehealth (INDEPENDENT_AMBULATORY_CARE_PROVIDER_SITE_OTHER): Payer: BC Managed Care – PPO | Admitting: Psychiatry

## 2022-01-15 ENCOUNTER — Encounter: Payer: Self-pay | Admitting: Psychiatry

## 2022-01-15 DIAGNOSIS — F330 Major depressive disorder, recurrent, mild: Secondary | ICD-10-CM | POA: Diagnosis not present

## 2022-01-15 DIAGNOSIS — R5383 Other fatigue: Secondary | ICD-10-CM

## 2022-01-15 MED ORDER — BUPROPION HCL ER (XL) 150 MG PO TB24: 450.0000 mg | ORAL_TABLET | Freq: Every day | ORAL | 0 refills | Status: DC

## 2022-01-15 NOTE — Patient Instructions (Signed)
Continue bupropion 450 mg daily  Continue escitalopram 20 mg daily  Next appointment:0 8/23 at 4 PM

## 2022-02-08 ENCOUNTER — Other Ambulatory Visit: Payer: Self-pay | Admitting: Psychiatry

## 2022-02-19 ENCOUNTER — Ambulatory Visit (INDEPENDENT_AMBULATORY_CARE_PROVIDER_SITE_OTHER): Payer: BC Managed Care – PPO | Admitting: Licensed Clinical Social Worker

## 2022-02-19 DIAGNOSIS — F330 Major depressive disorder, recurrent, mild: Secondary | ICD-10-CM

## 2022-02-19 NOTE — Progress Notes (Unsigned)
Virtual Visit via Video Note  I connected with Jared [name] on 02/19/22 at  3:00 PM EDT by a video enabled telemedicine application and verified that I am speaking with the correct person using two identifiers.  Location: Patient: home Provider: [place] Office   I discussed the limitations of evaluation and management by telemedicine and the availability of in person appointments. The patient expressed understanding and agreed to proceed.   I discussed the assessment and treatment plan with the patient. The patient was provided an opportunity to ask questions and all were answered. The patient agreed with the plan and demonstrated an understanding of the instructions.   The patient was advised to call back or seek an in-person evaluation if the symptoms worsen or if the condition fails to improve as anticipated.  I provided 30 minutes of non-face-to-face time during this encounter.   Jared [name] R Jared Folks, LCSW   THERAPIST PROGRESS NOTE  Session Time: 3-330p  Participation Level: Active  Behavioral Response: Neat and Well GroomedAlertDepressed  Type of Therapy: Individual Therapy  Treatment Goals addressed: Problem: Depression CCP Problem  1 Decrease depressive symptoms and improve levels of effective functioning-pt reports a decrease in overall depression symptoms 3 out of 5 sessions documented.  Goal: LTG: Reduce frequency, intensity, and duration of depression symptoms as evidenced by: pt self report 3 out of 5 sessions documented Outcome: Progressing Goal: STG: Jared [name] "Jared [name]" WILL PARTICIPATE IN AT LEAST 80% OF SCHEDULED INDIVIDUAL PSYCHOTHERAPY SESSIONS Outcome: Progressing Intervention: REVIEW PLEASE SKILLS (TREAT PHYSICAL ILLNESS, BALANCE EATING, AVOID MOOD-ALTERING SUBSTANCES, BALANCE SLEEP AND GET EXERCISE) WITH Jared [name] "Jared [name]" Note: Reviewed  Intervention: Encourage self-care activities Note: Reviewed  Intervention:  Encourage verbalization of feelings/concerns/expectations Note: Allowed/explored  ProgressTowards Goals: Not Progressing  Interventions: CBT  Summary: Jared [name] is a 66 y.o. male who presents with continuing symptoms related to depression. Pt reports a recent increase in buproprion, which pt feels has given him a bit more energy. Pt reports good quality and quantity of sleep.   Allowed pt to explore and express thoughts and feelings associated with recent life situations and external stressors.Patient reports recent stress associated with moving into a new home. Patient reports that the move itself was stressful, and his old house has not sold yet so he is also faced with the financial burden of having to pay 2 mortgage payments. Patient reports that mood wise he feels that he has plateaued--when asked what that means, patient reports that he is not any better and he is not any worse. Patient reports that there was a recent increase in his bupropion, and patient does not feel that it has made any difference at all.  Patient reports that currently his brother-in-law is staying with him for several weeks--patient states that brother-in-law typically lives with his daughter and her children, but will come and stay with them whenever he needs a break. Patient reports that he is fine with this and it has never been any issue or has caused any additional stress.  Patient reports no change in appetite, and that he is sleeping fairly well most nights. 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 Praised patients commitment to physical activity.  Reviewed importance of continued self-care activities to help balance out all the stress of recent move and financial obligations. Reviewed stimulating activities to help manage depression symptoms and relaxation strategies to help manage anxiety symptoms. Patient reflects  understanding  and willingness to cooperate.  Continued recommendations are as follows: self care behaviors, positive social engagements, focusing on overall work/home/life balance, and focusing on positive physical and emotional wellness.   Suicidal/Homicidal: No  Therapist Response: Pt is continuing to apply interventions learned in session into daily life situations. Pt is currently on track to meet goals utilizing interventions mentioned above. Personal growth and progress noted. Progress intermittent/fluctuating at time of session. Treatment to continue as indicated.   Plan: Return again in 4 weeks.  Diagnosis:  Encounter Diagnosis  Name Primary?   MDD (major depressive disorder), recurrent episode, mild ([place]) Yes    Collaboration of Care: Other pt encouraged to continue care with psychiatrist of record, Dr. Ursula [name]  Patient/Guardian was advised Release of Information must be obtained prior to any record release in order to collaborate their care with an outside provider. Patient/Guardian was advised if they have not already done so to contact the registration department to sign all necessary forms in order for [place] to release information regarding their care.   Consent: Patient/Guardian gives verbal consent for treatment and assignment of benefits for services provided during this visit. Patient/Guardian expressed understanding and agreed to proceed.   [place], LCSW 02/19/2022

## 2022-03-11 NOTE — Progress Notes (Unsigned)
Virtual Visit via Video Note  I connected with Jared [name] on 03/12/22 at  4:00 PM EDT by a video enabled telemedicine application and verified that I am speaking with the correct person using two identifiers.  Location: Patient: home Provider: office Persons participated in the visit- patient, provider    I discussed the limitations of evaluation and management by telemedicine and the availability of in person appointments. The patient expressed understanding and agreed to proceed.    I discussed the assessment and treatment plan with the patient. The patient was provided an opportunity to ask questions and all were answered. The patient agreed with the plan and demonstrated an understanding of the instructions.   The patient was advised to call back or seek an in-person evaluation if the symptoms worsen or if the condition fails to improve as anticipated.  I provided 17 minutes of non-face-to-face time during this encounter.   Norman Clay, MD    [facility] MD/PA/NP OP Progress Note  03/12/2022 4:35 PM Jared [name]  MRN:  [id number]  Chief Complaint:  Chief Complaint  Patient presents with   Follow-up   Depression   HPI:  This is a follow-up appointment for depression.  He states that he moved into the new house in July.  Although it was stressful as the old house was not sold for a few weeks, it would be sold next month.  He thinks his mood has plateaued.  He feels just there.  Although he does not know how to describe it, he is laid back.  He is back to cutting grass.  His son, who has not contacted him for a while visited him. He thinks that the relationship has been better. He continues to feel fatigue. He has lost ten pounds despite he eats well.  He has diaphoresis, and it usually occurs during the day rather than at night. He feels nauseated in the morning.  He wonders if it might be related to medication, although he feels comfortable to stay on the medication at this  time.  Although he reports passive SI, stating that he fed up with his life for more than 40 years, he now has reason to live.  He denies any SI intent or plan.  He feels comfortable to stay on the medication as it is for now.   Daily routine: work Support: Household: wife, mother in [employer] [place] Marital status: married for 29 years Number of children: 2 (54 son in [place], 43 daughter in [place]) Employment: system admin Education:   Last PCP / ongoing medical evaluation  Visit Diagnosis:    ICD-10-CM   1. MDD (major depressive disorder), recurrent episode, mild ([place])  F33.0     2. Fatigue, unspecified type  R53.83       Past Psychiatric History: Please see initial evaluation for full details. I have reviewed the history. No updates at this time.     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   CHOLECYSTECTOMY     added umbilical mesh   COLONOSCOPY WITH PROPOFOL N/A 03/27/2017   Procedure: COLONOSCOPY WITH PROPOFOL;  Surgeon: Manya Silvas, MD;  Location: [facility] ENDOSCOPY;  Service: Endoscopy;  Laterality: N/A;   ESOPHAGOGASTRODUODENOSCOPY (EGD) WITH PROPOFOL N/A 03/27/2017   Procedure: ESOPHAGOGASTRODUODENOSCOPY (EGD) WITH PROPOFOL;  Surgeon: Manya Silvas, MD;  Location: [facility] ENDOSCOPY;  Service: Endoscopy;  Laterality: N/A;   hip fracture with rod placement Right    MASS EXCISION Left 05/24/2021   Procedure: EXCISION MASS;  Surgeon: Benjamine Sprague, DO;  Location: [facility] ORS;  Service: General;  Laterality: Left;  Prone postition    Family  Psychiatric History: Please see initial evaluation for full details. I have reviewed the history. No updates at this time.     Family History:  Family History  Problem Relation Age of Onset   Depression Mother    Depression Father    Depression Sister    Depression Sister    Depression Brother     Social History:  Social History   Socioeconomic History   Marital status: Married    Spouse name: Not on file   Number of children: 2   Years of education: Not on file   Highest education level: Associate degree: occupational, Hotel manager, or vocational program  Occupational History   Not on file  Tobacco Use   Smoking status: Former    Packs/day: 1.00    Years: 45.00    Total pack years: 45.00    Types: Cigarettes    Quit date: 01/30/2019    Years since quitting: 3.1   Smokeless tobacco: Never  Vaping Use   Vaping Use: Former  Substance and Sexual Activity   Alcohol use: No   Drug use: No   Sexual activity: Yes    Birth control/protection: None  Other Topics Concern   Not on file  Social History Narrative   Not on file   Social Determinants of Health   Financial Resource Strain: Not on file  Food Insecurity: Not on file  Transportation Needs: Not on file  Physical Activity: Not on file  Stress: Not on file  Social Connections: Not on file    Allergies: No Known Allergies  Metabolic Disorder Labs: No results found for: "HGBA1C", "MPG" No results found for: "PROLACTIN" No results found for: "CHOL", "TRIG", "HDL", "CHOLHDL", "VLDL", "LDLCALC" No results found for: "TSH"  Therapeutic Level Labs: No results found for: "LITHIUM" No results found for: "VALPROATE" No results found for: "CBMZ"  Current Medications: Current Outpatient Medications  Medication Sig Dispense Refill   buPROPion (WELLBUTRIN XL) 150 MG 24 hr tablet Take 3 tablets (450 mg total) by mouth daily. 90 tablet 2   escitalopram (LEXAPRO) 20 MG tablet Take 20 mg by mouth daily.     ibuprofen  (ADVIL,MOTRIN) 200 MG tablet Take 400 mg by mouth every 6 (six) hours as needed for mild pain.     melatonin 5 MG TABS Take 5 mg by mouth.     methocarbamol (ROBAXIN) 500 MG tablet Take 250 mg by mouth at bedtime as needed for muscle spasms.     Multiple Vitamins-Minerals (CENTRUM SILVER 50+MEN PO) Take 1 tablet by mouth daily.     tadalafil (CIALIS) 10 MG tablet Take 0.5 tablets (5 mg total) by mouth daily as needed for erectile dysfunction. 30 tablet 11   No current facility-administered medications for this visit.     Musculoskeletal: Strength & Muscle Tone:  N/A Gait & Station:  N/A Patient leans: N/A  Psychiatric Specialty Exam: Review of Systems  Psychiatric/Behavioral:  Positive for dysphoric mood. Negative for agitation, behavioral problems, confusion, decreased concentration, hallucinations, self-injury, sleep disturbance and suicidal ideas. The patient is not nervous/anxious and is not hyperactive.   All other systems reviewed and are negative.   There were no vitals taken for this visit.There is no height or weight on file to calculate BMI.  General Appearance: Fairly Groomed  Eye Contact:  Good  Speech:  Clear and Coherent  Volume:  Normal  Mood:   better  Affect:  Appropriate, Congruent, and Full Range  Thought Process:  Coherent  Orientation:  Full (Time, Place, and Person)  Thought Content: Logical   Suicidal Thoughts:  No  Homicidal Thoughts:  No  Memory:  Immediate;   Good  Judgement:  Good  Insight:  Good  Psychomotor Activity:  Normal  Concentration:  Concentration: Good and Attention Span: Good  Recall:  Good  Fund of Knowledge: Good  Language: Good  Akathisia:  No  Handed:  Right  AIMS (if indicated): not done  Assets:  Communication Skills Desire for Improvement  ADL's:  Intact  Cognition: WNL  Sleep:  Poor   Screenings: Web designer from 09/18/2021 in [place] Office Visit from 08/19/2021 in  [place]  PHQ-2 Total Score 4 4  PHQ-9 Total Score 12 14      Flowsheet Row Counselor from 01/01/2022 in [place] from 09/18/2021 in [place] Office Visit from 08/19/2021 in [place] No Risk No Risk Error: Q3, 4, or 5 should not be populated when Q2 is No        Assessment and Plan:  Jared [name] is a 66 y.o. year old male with a history of depression, arthritis, GERD, pancreatic neuroendocrine tumor s/p excision, who presents for follow up appointment for below.    1. MDD (major depressive disorder), recurrent episode, mild ([place]) He reports overall improvement in his mood symptoms since the last visit, although he continues to report fatigue and mild anhedonia.  Will continue current dose of bupropion and Lexapro to target depression.   2. Fatigue, unspecified type Unchanged.  He is planning to discuss with his PCP regarding evaluation of sleep apnea.   # Weight loss He reports 10 pounds of weight loss, fatigue, diaphoresis and occasional nausea.  He was advised to discuss with his PCP for further evaluation.    # cognitive impairment Unchanged. He reports occasional difficulty in remembering names of people.  IADL independent.  Noted that he was evaluated by his neurologist symptoms in 2021 with brain MRI, and labs, which are unremarkable to explain his symptoms.  Will continue to monitor this.    Plan Continue bupropion 450 mg daily  Continue escitalopram 20 mg daily (QTc 412 msec in 04/2021, HR 52 sinus bradycardia)- declined refills Next appointment:  11/15 at 11; 30  for 30 mins, video [email or web address] Referred for evaluation of sleep   The patient demonstrates the following risk factors for suicide: Chronic risk factors for suicide include: psychiatric disorder of depression . Acute risk factors for suicide  include: family or marital conflict. Protective factors for this patient include: hope for the future and religious beliefs against suicide. Considering these factors, the overall suicide risk at this point appears to be low. Patient is appropriate for outpatient follow  up.           Collaboration of Care: Collaboration of Care: Other N/A  Patient/Guardian was advised Release of Information must be obtained prior to any record release in order to collaborate their care with an outside provider. Patient/Guardian was advised if they have not already done so to contact the registration department to sign all necessary forms in order for [place] to release information regarding their care.   Consent: Patient/Guardian gives verbal consent for treatment and assignment of benefits for services provided during this visit. Patient/Guardian expressed understanding and agreed to proceed.    Norman Clay, MD 03/12/2022, 4:35 PM

## 2022-03-12 ENCOUNTER — Telehealth (INDEPENDENT_AMBULATORY_CARE_PROVIDER_SITE_OTHER): Payer: BC Managed Care – PPO | Admitting: Psychiatry

## 2022-03-12 ENCOUNTER — Encounter: Payer: Self-pay | Admitting: Psychiatry

## 2022-03-12 DIAGNOSIS — R5383 Other fatigue: Secondary | ICD-10-CM | POA: Diagnosis not present

## 2022-03-12 DIAGNOSIS — F330 Major depressive disorder, recurrent, mild: Secondary | ICD-10-CM

## 2022-03-12 MED ORDER — BUPROPION HCL ER (XL) 150 MG PO TB24: 450.0000 mg | ORAL_TABLET | Freq: Every day | ORAL | 2 refills | Status: DC

## 2022-04-01 ENCOUNTER — Other Ambulatory Visit: Payer: Self-pay | Admitting: Family Medicine

## 2022-04-01 DIAGNOSIS — F17211 Nicotine dependence, cigarettes, in remission: Secondary | ICD-10-CM

## 2022-04-01 DIAGNOSIS — Z720 Tobacco use: Secondary | ICD-10-CM

## 2022-04-16 ENCOUNTER — Ambulatory Visit (HOSPITAL_COMMUNITY): Payer: BC Managed Care – PPO | Admitting: Licensed Clinical Social Worker

## 2022-04-16 ENCOUNTER — Ambulatory Visit: Admission: RE | Admit: 2022-04-16 | Payer: BC Managed Care – PPO | Source: Ambulatory Visit

## 2022-04-23 ENCOUNTER — Ambulatory Visit: Admission: RE | Admit: 2022-04-23 | Payer: BC Managed Care – PPO | Source: Ambulatory Visit

## 2022-04-30 ENCOUNTER — Ambulatory Visit
Admission: RE | Admit: 2022-04-30 | Discharge: 2022-04-30 | Disposition: A | Discharge status: Home / Self Care | Payer: BC Managed Care – PPO | Source: Ambulatory Visit | Attending: Family Medicine | Admitting: Family Medicine

## 2022-04-30 DIAGNOSIS — F17211 Nicotine dependence, cigarettes, in remission: Secondary | ICD-10-CM | POA: Diagnosis not present

## 2022-04-30 DIAGNOSIS — Z720 Tobacco use: Secondary | ICD-10-CM | POA: Insufficient documentation

## 2022-05-15 ENCOUNTER — Encounter: Payer: Self-pay | Admitting: *Deleted

## 2022-05-19 ENCOUNTER — Telehealth: Payer: Self-pay | Admitting: Diagnostic Neuroimaging

## 2022-05-19 ENCOUNTER — Encounter (INDEPENDENT_AMBULATORY_CARE_PROVIDER_SITE_OTHER): Payer: Self-pay

## 2022-05-19 ENCOUNTER — Encounter: Payer: Self-pay | Admitting: Diagnostic Neuroimaging

## 2022-05-19 ENCOUNTER — Ambulatory Visit (INDEPENDENT_AMBULATORY_CARE_PROVIDER_SITE_OTHER): Payer: BC Managed Care – PPO | Admitting: Diagnostic Neuroimaging

## 2022-05-19 VITALS — BP 117/73 | HR 57 | Ht 78.0 in | Wt 223.5 lb

## 2022-05-19 DIAGNOSIS — R413 Other amnesia: Secondary | ICD-10-CM | POA: Diagnosis not present

## 2022-05-19 DIAGNOSIS — R269 Unspecified abnormalities of gait and mobility: Secondary | ICD-10-CM | POA: Diagnosis not present

## 2022-05-19 NOTE — Patient Instructions (Signed)
GAIT DIFFICULTY (bilateral lower ext weakness and numbness; neuropathy vs lumbar radiculopathies) - check MRI lumbar spine - check neuropathy, myopathy labs - consider EMG/NCS - PT evaluation - use cane / walker  MEMORY LOSS (depression, sleep apnea) - MRI brain - labs

## 2022-05-19 NOTE — Telephone Encounter (Signed)
MR brain BCBS auth: [id number] exp. 05/19/22-06/17/22 medicare NPR sent to GI

## 2022-05-19 NOTE — Progress Notes (Signed)
[facility]  PATIENT: Jared [name] DOB: 1956/01/17  REFERRING CLINICIAN: [name], MD HISTORY FROM: patient  REASON FOR VISIT: new consult    HISTORICAL  CHIEF COMPLAINT:  Chief Complaint  Patient presents with   New Patient (Initial Visit)    Pt reports feeling okay. He states his last fall recently was 2 weeks ago. He did not hurt himself or go to ED. He states when he turns left or right he loses his balance. Room 7 alone    HISTORY OF PRESENT ILLNESS:   66 year old male here for evaluation of gait and balance difficulty and memory loss.  Symptoms have been present since 2021.  He has developed numbness in his feet, gait and balance difficulty.  Has difficulty in the shower when he closes eyes.  He feels like he is drunk when he walks.  He tends to lose his balance.  He is fallen 10 times last few months.  Also has some issues with neck pain and low back pain.  Has had some issues with memory loss issue.  Has had 40-year history of depression, currently on medications.   REVIEW OF SYSTEMS: Full 14 system review of systems performed and negative with exception of: as per HPI.  ALLERGIES: No Known Allergies  HOME MEDICATIONS: Outpatient Medications Prior to Visit  Medication Sig Dispense Refill   buPROPion (WELLBUTRIN XL) 150 MG 24 hr tablet Take 3 tablets (450 mg total) by mouth daily. 90 tablet 2   escitalopram (LEXAPRO) 20 MG tablet Take 20 mg by mouth daily.     ibuprofen (ADVIL,MOTRIN) 200 MG tablet Take 400 mg by mouth every 6 (six) hours as needed for mild pain.     Meth-Hyo-M Bl-Na Phos-Ph Sal (URIBEL) 118 MG CAPS Take by mouth.     methocarbamol (ROBAXIN) 500 MG tablet Take 250 mg by mouth at bedtime as needed for muscle spasms.     Methylcellulose, Laxative, 500 MG TABS Take by mouth.     Multiple Vitamins-Minerals (CENTRUM SILVER 50+MEN PO) Take 1 tablet by mouth daily.     tadalafil (CIALIS) 10 MG tablet Take 0.5 tablets (5 mg  total) by mouth daily as needed for erectile dysfunction. 30 tablet 11   melatonin 5 MG TABS Take 5 mg by mouth.     No facility-administered medications prior to visit.    PAST MEDICAL HISTORY: Past Medical History:  Diagnosis Date   Arthritis    Cancer ([place])    pancreas   Complication of anesthesia    tends to come out of anesthesia during procedure   Depression    Dysphagia    Elevated lipids    GERD (gastroesophageal reflux disease)    H/O blood clots    superficial blood clot in left leg below knee tx w/ eliquis x 3 mo   History of kidney stones    Kidney disease    Neuroendocrine tumor    Subdural hematoma ([facility])    hx of   Tobacco abuse    Unsteadiness     PAST SURGICAL HISTORY: Past Surgical History:  Procedure Laterality Date   ABDOMINAL SURGERY     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   CHOLECYSTECTOMY     added umbilical mesh   COLONOSCOPY WITH PROPOFOL N/A 03/27/2017   Procedure: COLONOSCOPY WITH PROPOFOL;  Surgeon: Manya Silvas, MD;  Location: [facility] ENDOSCOPY;  Service: Endoscopy;  Laterality: N/A;   ESOPHAGOGASTRODUODENOSCOPY (EGD) WITH PROPOFOL N/A 03/27/2017   Procedure: ESOPHAGOGASTRODUODENOSCOPY (EGD) WITH  PROPOFOL;  Surgeon: Manya Silvas, MD;  Location: [facility] ENDOSCOPY;  Service: Endoscopy;  Laterality: N/A;   hip fracture with rod placement Right    MASS EXCISION Left 05/24/2021   Procedure: EXCISION MASS;  Surgeon: Benjamine Sprague, DO;  Location: [facility] ORS;  Service: General;  Laterality: Left;  Prone postition    FAMILY HISTORY: Family History  Problem Relation Age of Onset   Cancer Mother    Depression Mother    Stroke Father    Depression Father    Aneurysm Father    Depression Sister    Depression Sister    Cancer Brother    Depression Brother     SOCIAL HISTORY: Social History   Socioeconomic History   Marital status: Married    Spouse name: Not on file   Number of children: 2   Years of education: Not on file    Highest education level: Associate degree: occupational, Hotel manager, or vocational program  Occupational History   Not on file  Tobacco Use   Smoking status: Former    Packs/day: 1.00    Years: 45.00    Total pack years: 45.00    Types: Cigarettes    Quit date: 01/30/2019    Years since quitting: 3.3   Smokeless tobacco: Never  Vaping Use   Vaping Use: Former  Substance and Sexual Activity   Alcohol use: No   Drug use: No   Sexual activity: Yes    Birth control/protection: None  Other Topics Concern   Not on file  Social History Narrative   Lives with wife   Social Determinants of Health   Financial Resource Strain: Not on file  Food Insecurity: Not on file  Transportation Needs: Not on file  Physical Activity: Not on file  Stress: Not on file  Social Connections: Not on file  Intimate Partner Violence: Not on file     PHYSICAL EXAM  GENERAL EXAM/CONSTITUTIONAL: Vitals:  Vitals:   05/19/22 0843  BP: 117/73  Pulse: (!) 57  Weight: 223 lb 8 oz (101.4 kg)  Height: _0  (1.981 m)   Body mass index is 25.83 kg/m. Wt Readings from Last 3 Encounters:  05/19/22 223 lb 8 oz (101.4 kg)  01/02/22 235 lb (106.6 kg)  10/09/21 234 lb (106.1 kg)   Patient is in no distress; well developed, nourished and groomed; neck is supple  CARDIOVASCULAR: Examination of carotid arteries is normal; no carotid bruits Regular rate and rhythm, no murmurs Examination of peripheral vascular system by observation and palpation is normal  EYES: Ophthalmoscopic exam of optic discs and posterior segments is normal; no papilledema or hemorrhages No results found.  MUSCULOSKELETAL: Gait, strength, tone, movements noted in Neurologic exam below  NEUROLOGIC: MENTAL STATUS:     05/19/2022    9:57 AM  MMSE - Mini Mental State Exam  Orientation to time 5  Orientation to Place 5  Registration 3  Attention/ Calculation 4  Recall 2  Language- name 2 objects 1  Language- repeat 1   Language- follow 3 step command 3  Language- read & follow direction 1  Write a sentence 1  Copy design 1  Total score 27   awake, alert, oriented to person, place and time recent and remote memory intact normal attention and concentration language fluent, comprehension intact, naming intact fund of knowledge appropriate  CRANIAL NERVE:  2nd - no papilledema on fundoscopic exam 2nd, 3rd, 4th, 6th - pupils equal and reactive to light, visual fields full to  confrontation, extraocular muscles intact, no nystagmus 5th - facial sensation symmetric 7th - facial strength symmetric 8th - hearing intact 9th - palate elevates symmetrically, uvula midline 11th - shoulder shrug symmetric 12th - tongue protrusion midline  MOTOR:  normal bulk and tone, full strength in the BUE BILATERAL HIP FLEXION (R 3, L 4) FOOT DF (R 3+, L 3-)  SENSORY:  normal and symmetric to light touch, temperature, vibration; ABSENT VIB AT TOES AND ANKLES  COORDINATION:  finger-nose-finger, fine finger movements normal  REFLEXES:  deep tendon reflexes TRACE and symmetric; ABSENT AT ANKLES  GAIT/STATION:  WIDE GAIT; STEPPAGE GAIT; BILATERAL FOOT DROP; UNSTEADY     DIAGNOSTIC DATA (LABS, IMAGING, TESTING) - I reviewed patient records, labs, notes, testing and imaging myself where available.  Lab Results  Component Value Date   WBC 6.7 05/15/2021   HGB 14.8 05/15/2021   HCT 42.7 05/15/2021   MCV 95.3 05/15/2021   PLT 251 05/15/2021      Component Value Date/Time   NA 138 08/22/2008 0525   K 4.2 08/22/2008 0525   CL 105 08/22/2008 0525   CO2 28 08/22/2008 0525   GLUCOSE 114 (H) 08/22/2008 0525   BUN 9 08/22/2008 0525   CREATININE 0.90 10/05/2020 1359   CALCIUM 8.5 08/22/2008 0525   GFRNONAA >60 08/22/2008 0525   GFRAA  08/22/2008 0525    >60        The eGFR has been calculated using the MDRD equation. This calculation has not been validated in all clinical situations. eGFR's  persistently <60 mL/min signify possible Chronic Kidney Disease.   No results found for: "CHOL", "HDL", "[place]", "LDLDIRECT", "TRIG", "CHOLHDL" No results found for: "HGBA1C" No results found for: "VITAMINB12" No results found for: "TSH"   03/27/20 MRI BRAIN [I reviewed images myself and agree with interpretation. Dural thickening, not clearly subdural hematoma as per addendum. -VRP]  1. Positive for diffuse bilateral subdural hematoma versus dural thickening, 2-3 mm in thickness throughout both the entire posterior fossa and bilateral supratentorial brain. Follow-up noncontrast Head CT might be able to confirm hyperdense blood products, although small SDH this size will frequently be occult by CT. Follow-up Post-contrast brain MRI would be valuable although a degree of dural thickening is typically seen with SDH. Otherwise CSF analysis might also be valuable. 2. No significant intracranial mass effect or other acute intracranial abnormality. 3. MRAs today are reported separately.  ADDENDUM: Study discussed by telephone with Dr. Maudry Diego [facility] on 03/27/2020 at 1602 hours. We agreed that the signal on MRI and MRA favors diffuse pachymeningeal thickening over widespread trace subdural blood. And we briefly discussed further investigation as well as the broad differential diagnosis of such dural thickening.   03/27/20 MRA head 1. Negative intracranial MRA aside from a degree of generalized arterial tortuosity. 2. See also brain MRI today reported separately.  03/27/20 MRA neck Negative noncontrast Neck MRA.   ASSESSMENT AND PLAN  66 y.o. year old male here with:   Dx:  1. Gait difficulty   2. Memory loss      PLAN:  GAIT DIFFICULTY (bilateral lower ext weakness and numbness; neuropathy vs lumbar radiculopathies) - check MRI lumbar spine - check neuropathy, myopathy labs - consider EMG/NCS - PT evaluation - use cane / walker  MEMORY LOSS (depression, sleep apnea; MMSE  27//30) - check MRI brain - labs  DURAL THICKENING (non-specific; not clearly subdural hematoma) - repeat MRI brain w/wo  Orders Placed This Encounter  Procedures   MR BRAIN  W WO CONTRAST   MR LUMBAR SPINE WO CONTRAST   Vitamin B12   Hemoglobin A1c   TSH   Multiple Myeloma Panel (SPEP&IFE w/QIG)   CK   Aldolase   ANA,IFA RA Diag Pnl w/rflx Tit/Patn   Sjogren's syndrome antibods(ssa + ssb)   Ambulatory referral to Physical Therapy   NCV with EMG(electromyography)   Return for pending if symptoms worsen or fail to improve, pending test results.  I reviewed images, labs, notes, records myself. I summarized findings and reviewed with patient, for this high risk condition (memory loss, gait diff) requiring high complexity decision making.    [name], MD 67/34/1937, 9:02 AM Certified in Neurology, Neurophysiology and Neuroimaging  [facility] [address], [place], [place] [address] [phone]

## 2022-05-21 ENCOUNTER — Ambulatory Visit (HOSPITAL_COMMUNITY): Payer: BC Managed Care – PPO | Admitting: Licensed Clinical Social Worker

## 2022-05-21 NOTE — Telephone Encounter (Signed)
MRI for lumbar spine pending faxed notes

## 2022-05-21 NOTE — Telephone Encounter (Signed)
Jared [name]: [id number] exp. 05/19/22-06/17/22 medicare NPR sent to GI [phone]

## 2022-05-23 LAB — FANA STAINING PATTERNS
Homogeneous Pattern: 1:80 {titer}
Speckled Pattern: 1:80 {titer}

## 2022-05-23 LAB — HEMOGLOBIN A1C
Est. average glucose Bld gHb Est-mCnc: 131 mg/dL
Hgb A1c MFr Bld: 6.2 % — ABNORMAL HIGH (ref 4.8–5.6)

## 2022-05-23 LAB — MULTIPLE MYELOMA PANEL, SERUM
Albumin SerPl Elph-Mcnc: 4 g/dL (ref 2.9–4.4)
Albumin/Glob SerPl: 1.5 (ref 0.7–1.7)
Alpha 1: 0.2 g/dL (ref 0.0–0.4)
Alpha2 Glob SerPl Elph-Mcnc: 0.5 g/dL (ref 0.4–1.0)
B-Globulin SerPl Elph-Mcnc: 0.8 g/dL (ref 0.7–1.3)
Gamma Glob SerPl Elph-Mcnc: 1.2 g/dL (ref 0.4–1.8)
Globulin, Total: 2.8 g/dL (ref 2.2–3.9)
IgA/Immunoglobulin A, Serum: 187 mg/dL (ref 61–437)
IgG (Immunoglobin G), Serum: 1243 mg/dL (ref 603–1613)
IgM (Immunoglobulin M), Srm: 106 mg/dL (ref 20–172)
Total Protein: 6.8 g/dL (ref 6.0–8.5)

## 2022-05-23 LAB — ANA,IFA RA DIAG PNL W/RFLX TIT/PATN
ANA Titer 1: POSITIVE — AB
Cyclic Citrullin Peptide Ab: <1 units (ref 0–19)
Rheumatoid fact SerPl-aCnc: <10 IU/mL (ref <14.0)

## 2022-05-23 LAB — CK: Total CK: 167 U/L (ref 41–331)

## 2022-05-23 LAB — TSH: TSH: 0.85 u[IU]/mL (ref 0.450–4.500)

## 2022-05-23 LAB — VITAMIN B12: Vitamin B-12: 384 pg/mL (ref 232–1245)

## 2022-05-23 LAB — ALDOLASE: Aldolase: 3.6 U/L (ref 3.3–10.3)

## 2022-05-23 LAB — SJOGREN'S SYNDROME ANTIBODS(SSA + SSB)
ENA SSA (RO) Ab: <0.2 AI (ref 0.0–0.9)
ENA SSB (LA) Ab: <0.2 AI (ref 0.0–0.9)

## 2022-05-27 NOTE — Therapy (Signed)
OUTPATIENT PHYSICAL THERAPY NEURO EVALUATION   Patient Name: Jared [name] MRN: [id number] DOB:17-Jan-1956, 66 y.o., male Today's Date: 05/29/2022   PCP:  [name], MD  REFERRING PROVIDER: [name], MD   PT End of Session - 05/29/22 0915     Visit Number 1    Number of Visits 17    Date for PT Re-Evaluation 07/28/22    Authorization Type BCBS    PT Start Time 1445    PT Stop Time 1530    PT Time Calculation (min) 45 min    Equipment Utilized During Treatment Gait belt    Activity Tolerance Patient tolerated treatment well    Behavior During Therapy WFL for tasks assessed/performed             Past Medical History:  Diagnosis Date   Arthritis    Cancer ([place])    pancreas   Complication of anesthesia    tends to come out of anesthesia during procedure   Depression    Dysphagia    Elevated lipids    GERD (gastroesophageal reflux disease)    H/O blood clots    superficial blood clot in left leg below knee tx w/ eliquis x 3 mo   History of kidney stones    Kidney disease    Neuroendocrine tumor    Subdural hematoma ([facility])    hx of   Tobacco abuse    Unsteadiness    Past Surgical History:  Procedure Laterality Date   ABDOMINAL SURGERY     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   CHOLECYSTECTOMY     added umbilical mesh   COLONOSCOPY WITH PROPOFOL N/A 03/27/2017   Procedure: COLONOSCOPY WITH PROPOFOL;  Surgeon: Manya Silvas, MD;  Location: [facility] ENDOSCOPY;  Service: Endoscopy;  Laterality: N/A;   ESOPHAGOGASTRODUODENOSCOPY (EGD) WITH PROPOFOL N/A 03/27/2017   Procedure: ESOPHAGOGASTRODUODENOSCOPY (EGD) WITH PROPOFOL;  Surgeon: Manya Silvas, MD;  Location: [facility] ENDOSCOPY;  Service: Endoscopy;  Laterality: N/A;   hip fracture with rod placement Right    MASS EXCISION Left 05/24/2021   Procedure: EXCISION MASS;  Surgeon: Benjamine Sprague, DO;  Location: [facility] ORS;  Service: General;  Laterality: Left;  Prone postition   Patient Active  Problem List   Diagnosis Date Noted   AAA (abdominal aortic aneurysm) without rupture ([place]) 01/02/2022   RLS (restless legs syndrome) 09/19/2020   Varicose veins of both lower extremities 09/19/2020   TIA (transient ischemic attack) 01/01/2020   History of TIA (transient ischemic attack) 12/27/2019   PVC's (premature ventricular contractions) 12/27/2019   Pill dysphagia 03/17/2017   Hx of colonic polyps 12/05/2016   Irregular heart rate 12/05/2016   Hyperlipidemia 09/07/2015   Tobacco abuse 09/07/2015   Calculus, kidney 04/02/2015   Neuroendocrine tumor 06/22/2008    ONSET DATE: 05/19/2022  REFERRING DIAG: R26.9 (ICD-10-CM) - Gait difficulty R41.3 (ICD-10-CM) - Memory loss  THERAPY DIAG:  Unsteadiness on feet  Repeated falls  Other abnormalities of gait and mobility  Muscle weakness (generalized)  Rationale for Evaluation and Treatment: Rehabilitation  SUBJECTIVE:  SUBJECTIVE STATEMENT: Having issues with falling. Most recent fall was in July. Since that time, has fallen 8-10 times. A couple of falls has been stepping back off of a ladder. Fell in the driveway once, when picking up a printer and then turned and fell backwards. Had another fall while carrying something. Has not gotten hurt with these and nver gone to the ER for these falls. Saw Dr. Leta Baptist and has a brain/lumbar spine MRI scheduled for 06/09/22. Also scheduled for EMG testing. Also has been having some memory loss, started about 2 years ago. Has difficulty getting up from lower surfaces. When walking, notices veering to the R or the L. Reports it is very tough in the shower with his EC, feels very off balance. Has a cane at home, but has not been using it. Sometimes toe will get caught and then will stumble a little bit.  Pt  accompanied by: self  PERTINENT HISTORY: 66 year old male here for evaluation of gait and balance difficulty and memory loss. Symptoms have been present since 2021. He has developed numbness in his feet, gait and balance difficulty. Has difficulty in the shower when he closes eyes. He feels like he is drunk when he walks. He tends to lose his balance. He is fallen 10 times last few months.  PMH: Arthritis, hx of pancreas cancer, depression, hx of subdural hematoma , hx of hip fracture ~13 yrs ago   PAIN:  Are you having pain? No  PRECAUTIONS: Fall  WEIGHT BEARING RESTRICTIONS: No  FALLS: Has patient fallen in last 6 months? Yes. Number of falls >10 falls   LIVING ENVIRONMENT: Lives with: lives with their spouse and and wife's mother Lives in: House/apartment Stairs: No Has following equipment at home: Single point cane and Grab bars - has suction cup grab bars   PLOF: Independent  PATIENT GOALS: Wants to improve balance.   OBJECTIVE:   DIAGNOSTIC FINDINGS:  03/27/20 MRI BRAIN 1. Positive for diffuse bilateral subdural hematoma versus dural thickening, 2-3 mm in thickness throughout both the entire posterior fossa and bilateral supratentorial brain. Follow-up noncontrast Head CT might be able to confirm hyperdense blood products, although small SDH this size will frequently be occult by CT. Follow-up Post-contrast brain MRI would be valuable although a degree of dural thickening is typically seen with SDH. Otherwise CSF analysis might also be valuable. 2. No significant intracranial mass effect or other acute intracranial abnormality. 3. MRAs today are reported separately.  Brain and lumbar spine MRI scheduled for 06/09/22    COGNITION: Overall cognitive status: Within functional limits for tasks assessed, however pt reporting some memory issues.    SENSATION: Light touch: Reports numbness/tingling in feet and in thighs, with light touch pt more impaired distally in RLE   Proprioception: WFL at bilat ankles   COORDINATION: Heel to shin: WFL bilat     LOWER EXTREMITY MMT:    MMT Right Eval Left Eval  Hip flexion 4/5 4-/5  Hip extension    Hip abduction 5/5 5/5  Hip adduction 5/5 5/5  Hip internal rotation    Hip external rotation    Knee flexion 5/5 5/5  Knee extension 4/5 4+/5  Ankle dorsiflexion 3-/5 3-/5  Ankle plantarflexion    Ankle inversion    Ankle eversion    (Blank rows = not tested)  Strength testing performed in sitting.    TRANSFERS: Assistive device utilized: None  Sit to stand: SBA Stand to sit: SBA From standard chair can perform without UE  support, but reports at home has difficulty getting up from lower surfaces like the couch.    GAIT: Gait pattern: decreased ankle dorsiflexion- Right, decreased ankle dorsiflexion- Left, Right steppage, Left steppage, narrow BOS, poor foot clearance- Right, and poor foot clearance- Left Distance walked: Clinic distances  Assistive device utilized: None Level of assistance: SBA and CGA Comments: Pt with bilat foot drop and unsteadiness with gait with some staggering requiring CGA. Pt with tendency to almost scissor at times and have a narrow BOS.   FUNCTIONAL TESTS:  5 times sit to stand: 15.63 seconds with no UE support  Timed up and go (TUG): 9 seconds, cog TUG: 14.85 seconds 10 meter walk test: 11.16 seconds = 2.93 ft/sec with no AD   Feet together EO on level ground: 30 seconds, on air ex: 30 seconds    M-CTSIB (with feet hip width distance)  Condition 1: Firm Surface, EO 30 Sec, Normal Sway  Condition 2: Firm Surface, EC 30 Sec, Mild Sway, pt shifting weight posteriorly on heels  Condition 3: Foam Surface, EO 30 Sec, Mild Sway  Condition 4: Foam Surface, EC 3 Sec, pt losing balance to the R.      TODAY'S TREATMENT:                                                                                                                              DATE: 05/29/22   N/A during  eval.   PATIENT EDUCATION: Education details: Clinical findings, POC. When discussing POC; pt reports that he lives in [place] and would prefer to get PT at [facility] as it is closer to his house. PT discussed with front desk to try to get pt scheduled over there. Educated that due to ankle DF weakness and foot drop, pt may benefit from AFOs/foot up braces to help with foot drop during gait for improved gait mechanics. Will have to trial at future sessions. Discussed no getting on ladders and that pt would benefit from an AD (will trial different ones) to help prevent future falls.  Person educated: Patient Education method: Explanation Education comprehension: verbalized understanding  HOME EXERCISE PROGRAM: Will provide at next session.   GOALS: Goals reviewed with patient? Yes  SHORT TERM GOALS: Target date: 06/26/2022  Pt will be independent with initial HEP in order to build upon functional gains made in therapy. Baseline: Goal status:   2.  Pt will undergo further balance assessment (BERG vs. DGI with STG/LTG written) Baseline:  Goal status: INITIAL  3.  Pt will improve 5x sit<>stand to less than or equal to 14 sec to demonstrate improved functional strength and transfer efficiency.  Baseline: 15.63 seconds with no UE support Goal status: INITIAL  4.  Pt will ambulate at least 300' over level indoor/outdoor paved surfaces with supervision and LRAD in order to demo improved safety with mobility.  Baseline: No AD over level surfaces, supervision/CGA and unsteadiness.  Goal status: INITIAL  LONG TERM GOALS: Target date: 07/24/2022  Pt will be independent with final HEP in order to build upon functional gains made in therapy. Baseline:  Goal status: INITIAL  2.  BERG vs. DGI goal to be written to decr fall risk.  Baseline:  Goal status: INITIAL  3.  Pt will improve condition 4 of mCTSIB to at least 12 seconds in order to demo improved vestibular input for  balance.  Baseline: 3 seconds, pt losing balance to R.  Goal status: INITIAL  4.  Pt will improve gait speed with LRAD or appropriate braces/no AD to at least 3.3 ft/sec in order to demo improved community mobility.   Baseline: 11.16 seconds = 2.93 ft/sec with no AD  Goal status: INITIAL  5.  Pt will ambulate at least 500' outdoors over paved/grass surfaces with LRAD and supervision in order to demo improved community mobility.  Baseline:  Goal status: INITIAL    ASSESSMENT:  CLINICAL IMPRESSION: Patient is a 66 year old male referred to Neuro OPPT for Gait abnormality. Pt has had >10 falls in the past  6 months. Pt saw Dr. Leta Baptist recently and is undergoing further work-up. Has a brain/lumbar spine MRI scheduled on November 20th and NCV testing on Feb 2nd. The following deficits were present during the exam: impaired sensation, decr strength, impaired balance, gait abnormalities, difficulties with cog dual tasking. Based on 5x sit <> stand, cog TUG pt is an incr risk for falls. Pt is currently not using an AD.  Pt with difficulties with condition 2 and 4 on mCTSIB indicating decr vestibular input for balance and pt more reliant on vision. Pt will benefit from further balance assessment at next session. Pt would benefit from skilled PT to address these impairments and functional limitations to maximize functional mobility independence and decr fall risk.    OBJECTIVE IMPAIRMENTS: Abnormal gait, decreased activity tolerance, decreased balance, decreased knowledge of use of DME, difficulty walking, decreased ROM, decreased strength, and impaired sensation.   ACTIVITY LIMITATIONS: bending, stairs, transfers, and locomotion level  PARTICIPATION LIMITATIONS: community activity and yard work  PERSONAL FACTORS: Past/current experiences, Time since onset of injury/illness/exacerbation, 3+ comorbidities: Arthritis, hx of pancreas cancer, depression, hx of subdural hematoma , hx of hip fracture  ~13 yrs ago , and frequent falls (>10 in the past 6 months)  are also affecting patient's functional outcome.   REHAB POTENTIAL: Good  CLINICAL DECISION MAKING: Evolving/moderate complexity  EVALUATION COMPLEXITY: Moderate  PLAN:  PT FREQUENCY: 2x/week  PT DURATION: 8 weeks  PLANNED INTERVENTIONS: Therapeutic exercises, Therapeutic activity, Neuromuscular re-education, Balance training, Gait training, Patient/Family education, Self Care, Stair training, Vestibular training, Orthotic/Fit training, DME instructions, and Re-evaluation  PLAN FOR NEXT SESSION: Further assess balance and write goal - either BERG vs. DGI? Initial HEP for functional strength/balance tasks. Pt would benefit from trial of canes for AD and try a PLS AFO or foot up braces in a future session for foot drop.    Arliss Journey, PT, DPT  05/29/2022, 9:17 AM

## 2022-05-28 ENCOUNTER — Ambulatory Visit: Payer: BC Managed Care – PPO | Attending: Diagnostic Neuroimaging | Admitting: Physical Therapy

## 2022-05-28 DIAGNOSIS — R413 Other amnesia: Secondary | ICD-10-CM | POA: Diagnosis not present

## 2022-05-28 DIAGNOSIS — R2681 Unsteadiness on feet: Secondary | ICD-10-CM

## 2022-05-28 DIAGNOSIS — R2689 Other abnormalities of gait and mobility: Secondary | ICD-10-CM | POA: Diagnosis present

## 2022-05-28 DIAGNOSIS — M6281 Muscle weakness (generalized): Secondary | ICD-10-CM

## 2022-05-28 DIAGNOSIS — R269 Unspecified abnormalities of gait and mobility: Secondary | ICD-10-CM | POA: Insufficient documentation

## 2022-05-28 DIAGNOSIS — R296 Repeated falls: Secondary | ICD-10-CM

## 2022-05-30 NOTE — Progress Notes (Unsigned)
Virtual Visit via Video Note  I connected with Jared [name] on 06/04/22 at 11:30 AM EST by a video enabled telemedicine application and verified that I am speaking with the correct person using two identifiers.  Location: Patient: work Provider: office Persons participated in the visit- patient, provider    I discussed the limitations of evaluation and management by telemedicine and the availability of in person appointments. The patient expressed understanding and agreed to proceed.     I discussed the assessment and treatment plan with the patient. The patient was provided an opportunity to ask questions and all were answered. The patient agreed with the plan and demonstrated an understanding of the instructions.   The patient was advised to call back or seek an in-person evaluation if the symptoms worsen or if the condition fails to improve as anticipated.  I provided 20 minutes of non-face-to-face time during this encounter.   Jared Clay, MD     [facility] MD/PA/NP OP Progress Note  06/04/2022 12:00 PM Jared [name]  MRN:  [id number]  Chief Complaint:  Chief Complaint  Patient presents with   Depression   Follow-up   HPI:  - he was seen by a neurologist for gait difficulty and memory loss This is a follow-up appointment for depression, anxiety and fatigue.  He states that he has been doing the best, great first time for the last several years.  He was seen by neurologist for falls and weakness.  He will have MRI and nerve conduction study.  He had 1 session with the physical therapist.  He has been doing good at work.  Although he occasionally feels anxious when he has issues with computers, he has been able to handle things well.  Although he used to have death wish, he does not have it anymore.  He reports great relationship with his wife.  He sleeps better.  He has snoring, and he is trying to find a place where he can be seen under Medicare. He denies anhedonia, and  feels relaxed. He denies alcohol use or drug use.  Although he initially asks if he can try a lower dose of bupropion, he denies any side effects from it, and reports understanding to stay at the current dose to avoid relapse in his symptoms.   Daily routine: work Support: Household: wife, mother in [employer] Marital status: married for 29 years Number of children: 2 (11 son in [place], 69 daughter in [place]) Employment: system admin Education:   Last PCP / ongoing medical evaluation  220 lbs Wt Readings from Last 3 Encounters:  05/19/22 223 lb 8 oz (101.4 kg)  01/02/22 235 lb (106.6 kg)  10/09/21 234 lb (106.1 kg)      Visit Diagnosis:    ICD-10-CM   1. MDD (major depressive disorder), recurrent episode, mild ([place])  F33.0     2. Anxiety state  F41.1     3. Fatigue, unspecified type  R53.83       Past Psychiatric History: Please see initial evaluation for full details. I have reviewed the history. No updates at this time.     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 ENDOSCOPY;  Service: Endoscopy;  Laterality: N/A;   hip fracture with rod placement Right    MASS EXCISION Left 05/24/2021   Procedure: EXCISION MASS;  Surgeon: Benjamine Sprague, DO;  Location: [facility] ORS;  Service: General;  Laterality: Left;  Prone postition    Family Psychiatric History: Please see initial evaluation for full details. I have reviewed the history. No updates at this time.     Family History:  Family History  Problem Relation Age of Onset   Cancer Mother    Depression Mother    Stroke Father    Depression Father    Aneurysm Father    Depression Sister    Depression Sister    Cancer Brother    Depression Brother     Social History:  Social History   Socioeconomic History   Marital status: Married    Spouse name: Not on file   Number of children: 2   Years of education: Not on file   Highest education level: Associate degree: occupational, Hotel manager, or vocational program  Occupational History   Not on file  Tobacco Use   Smoking status: Former    Packs/day: 1.00    Years: 45.00    Total pack years: 45.00    Types: Cigarettes    Quit date: 01/30/2019    Years since quitting: 3.3   Smokeless tobacco: Never  Vaping Use   Vaping Use: Former  Substance and Sexual Activity   Alcohol use: No   Drug use: No   Sexual activity: Yes    Birth control/protection: None  Other Topics Concern   Not on file  Social History Narrative   Lives with wife   Social Determinants of Health   Financial Resource Strain: Not on file  Food Insecurity: Not on file  Transportation Needs: Not on file  Physical Activity: Not on file  Stress: Not on file  Social Connections: Not on file    Allergies: No Known Allergies  Metabolic Disorder Labs: Lab Results  Component Value Date   HGBA1C 6.2 (H) 05/19/2022   No results found for: "PROLACTIN" No results found for: "CHOL", "TRIG", "HDL", "CHOLHDL", "VLDL",  "LDLCALC" Lab Results  Component Value Date   TSH 0.850 05/19/2022    Therapeutic Level Labs: No results found for: "LITHIUM" No results found for: "VALPROATE" No results found for: "CBMZ"  Current Medications: Current Outpatient Medications  Medication Sig Dispense Refill   [START ON 06/11/2022] buPROPion (WELLBUTRIN XL) 150 MG 24 hr tablet Take 3 tablets (450 mg total) by mouth daily. 90 tablet 2   escitalopram (LEXAPRO) 20 MG tablet Take 1 tablet (20 mg total) by mouth daily. 30 tablet 2   ibuprofen (ADVIL,MOTRIN) 200  MG tablet Take 400 mg by mouth every 6 (six) hours as needed for mild pain.     Meth-Hyo-M Bl-Na Phos-Ph Sal (URIBEL) 118 MG CAPS Take by mouth.     methocarbamol (ROBAXIN) 500 MG tablet Take 250 mg by mouth at bedtime as needed for muscle spasms.     Methylcellulose, Laxative, 500 MG TABS Take by mouth.     Multiple Vitamins-Minerals (CENTRUM SILVER 50+MEN PO) Take 1 tablet by mouth daily.     tadalafil (CIALIS) 10 MG tablet Take 0.5 tablets (5 mg total) by mouth daily as needed for erectile dysfunction. 30 tablet 11   No current facility-administered medications for this visit.     Musculoskeletal: Strength & Muscle Tone:  N/A Gait & Station:  N/A Patient leans: N/A  Psychiatric Specialty Exam: Review of Systems  Psychiatric/Behavioral:  Negative for agitation, behavioral problems, confusion, decreased concentration, dysphoric mood, hallucinations, self-injury, sleep disturbance and suicidal ideas. The patient is nervous/anxious. The patient is not hyperactive.   All other systems reviewed and are negative.   There were no vitals taken for this visit.There is no height or weight on file to calculate BMI.  General Appearance: Fairly Groomed  Eye Contact:  Good  Speech:  Clear and Coherent  Volume:  Normal  Mood:   better  Affect:  Appropriate, Congruent, and calm  Thought Process:  Coherent  Orientation:  Full (Time, Place, and Person)  Thought Content:  Logical   Suicidal Thoughts:  No  Homicidal Thoughts:  No  Memory:  Immediate;   Good  Judgement:  Good  Insight:  Good  Psychomotor Activity:  Normal  Concentration:  Concentration: Good and Attention Span: Good  Recall:  Good  Fund of Knowledge: Good  Language: Good  Akathisia:  No  Handed:  Right  AIMS (if indicated): not done  Assets:  Communication Skills Desire for Improvement  ADL's:  Intact  Cognition: WNL  Sleep:  Fair   Screenings: Mini-Mental    [place] Office Visit from 05/19/2022 in [facility]  Total Score (max 30 points ) 27      PHQ2-9    Flowsheet Row Counselor from 09/18/2021 in [place] Office Visit from 08/19/2021 in [place]  PHQ-2 Total Score 4 4  PHQ-9 Total Score 12 14      Flowsheet Row Counselor from 01/01/2022 in [place] from 09/18/2021 in [place] Office Visit from 08/19/2021 in [place] No Risk No Risk Error: Q3, 4, or 5 should not be populated when Q2 is No        Assessment and Plan:  Jared [name] is a 66 y.o. year old male with a history of depression, arthritis, GERD, pancreatic neuroendocrine tumor s/p excision, who presents for follow up appointment for below.    1. MDD (major depressive disorder), recurrent episode, mild ([place]) 2. Anxiety state There has been overall improvement in depressive symptoms and anxiety, anhedonia since the last visit.  Will continue current dose of bupropion to target depression.  Will continue Lexapro to target depression and anxiety.  Noted that he reports more than 2 episodes of depression in the past; he agrees to stay on the current medication regimen as maintenance treatment.   # Fatigue Overall improving.  He has a history of snoring, and has mild fatigue.  He is in the process of  seeing a sleep specialist through PCP.    #  Weight loss He reports 10 pounds of weight loss, fatigue, diaphoresis and occasional nausea.  He was advised to discuss with his PCP for further evaluation.    # cognitive impairment Unchanged. He reports occasional difficulty in remembering names of people.  IADL independent.  Noted that he was evaluated by his neurologist symptoms in 2021 with brain MRI, and labs, which are unremarkable to explain his symptoms.  Will continue to monitor this.    Plan Continue bupropion 450 mg daily  Continue escitalopram 20 mg daily (QTc 412 msec in 04/2021, HR 52 sinus bradycardia)- declined refills Next appointment: 2/7 at 8:20, video [email or web address]  The patient demonstrates the following risk factors for suicide: Chronic risk factors for suicide include: psychiatric disorder of depression . Acute risk factors for suicide include: family or marital conflict. Protective factors for this patient include: hope for the future and religious beliefs against suicide. Considering these factors, the overall suicide risk at this point appears to be low. Patient is appropriate for outpatient follow up.             Collaboration of Care: Collaboration of Care: Other reviewed notes in Epic  Patient/Guardian was advised Release of Information must be obtained prior to any record release in order to collaborate their care with an outside provider. Patient/Guardian was advised if they have not already done so to contact the registration department to sign all necessary forms in order for [place] to release information regarding their care.   Consent: Patient/Guardian gives verbal consent for treatment and assignment of benefits for services provided during this visit. Patient/Guardian expressed understanding and agreed to proceed.    Jared Clay, MD 06/04/2022, 12:00 PM

## 2022-06-03 ENCOUNTER — Ambulatory Visit: Payer: BC Managed Care – PPO | Attending: Diagnostic Neuroimaging

## 2022-06-03 DIAGNOSIS — M6281 Muscle weakness (generalized): Secondary | ICD-10-CM | POA: Insufficient documentation

## 2022-06-03 DIAGNOSIS — R2681 Unsteadiness on feet: Secondary | ICD-10-CM | POA: Diagnosis not present

## 2022-06-03 DIAGNOSIS — R2689 Other abnormalities of gait and mobility: Secondary | ICD-10-CM | POA: Diagnosis present

## 2022-06-03 DIAGNOSIS — R296 Repeated falls: Secondary | ICD-10-CM | POA: Diagnosis present

## 2022-06-03 DIAGNOSIS — R262 Difficulty in walking, not elsewhere classified: Secondary | ICD-10-CM | POA: Insufficient documentation

## 2022-06-03 NOTE — Therapy (Addendum)
OUTPATIENT PHYSICAL THERAPY NEURO TREATMENT  Patient Name: Jared [name] MRN: [id number] DOB:06/30/1956, 66 y.o., male Today's Date: 06/03/2022  PCP:  [name], MD  REFERRING PROVIDER: [name], MD   PT End of Session - 06/03/22 1404     Visit Number 2    Number of Visits 17    Date for PT Re-Evaluation 07/28/22    Authorization Type BCBS    PT Start Time 9563    PT Stop Time 1430    PT Time Calculation (min) 25 min    Equipment Utilized During Treatment Gait belt    Activity Tolerance Patient tolerated treatment well    Behavior During Therapy WFL for tasks assessed/performed            Past Medical History:  Diagnosis Date   Arthritis    Cancer ([place])    pancreas   Complication of anesthesia    tends to come out of anesthesia during procedure   Depression    Dysphagia    Elevated lipids    GERD (gastroesophageal reflux disease)    H/O blood clots    superficial blood clot in left leg below knee tx w/ eliquis x 3 mo   History of kidney stones    Kidney disease    Neuroendocrine tumor    Subdural hematoma ([facility])    hx of   Tobacco abuse    Unsteadiness    Past Surgical History:  Procedure Laterality Date   ABDOMINAL SURGERY     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   CHOLECYSTECTOMY     added umbilical mesh   COLONOSCOPY WITH PROPOFOL N/A 03/27/2017   Procedure: COLONOSCOPY WITH PROPOFOL;  Surgeon: Manya Silvas, MD;  Location: [facility] ENDOSCOPY;  Service: Endoscopy;  Laterality: N/A;   ESOPHAGOGASTRODUODENOSCOPY (EGD) WITH PROPOFOL N/A 03/27/2017   Procedure: ESOPHAGOGASTRODUODENOSCOPY (EGD) WITH PROPOFOL;  Surgeon: Manya Silvas, MD;  Location: [facility] ENDOSCOPY;  Service: Endoscopy;  Laterality: N/A;   hip fracture with rod placement Right    MASS EXCISION Left 05/24/2021   Procedure: EXCISION MASS;  Surgeon: Benjamine Sprague, DO;  Location: [facility] ORS;  Service: General;  Laterality: Left;  Prone postition   Patient Active Problem  List   Diagnosis Date Noted   AAA (abdominal aortic aneurysm) without rupture ([place]) 01/02/2022   RLS (restless legs syndrome) 09/19/2020   Varicose veins of both lower extremities 09/19/2020   TIA (transient ischemic attack) 01/01/2020   History of TIA (transient ischemic attack) 12/27/2019   PVC's (premature ventricular contractions) 12/27/2019   Pill dysphagia 03/17/2017   Hx of colonic polyps 12/05/2016   Irregular heart rate 12/05/2016   Hyperlipidemia 09/07/2015   Tobacco abuse 09/07/2015   Calculus, kidney 04/02/2015   Neuroendocrine tumor 06/22/2008   ONSET DATE: 05/19/2022  REFERRING DIAG: R26.9 (ICD-10-CM) - Gait difficulty R41.3 (ICD-10-CM) - Memory loss  THERAPY DIAG:  Unsteadiness on feet  Repeated falls  Muscle weakness (generalized)  Other abnormalities of gait and mobility  Rationale for Evaluation and Treatment: Rehabilitation  SUBJECTIVE:  SUBJECTIVE STATEMENT:  The patient presents to skilled therapy for the first time at this clinic in good spirits. The patient had some difficulty finding the clinic and was 15 minutes late. The patient reports they have not had a fall since their initial evaluation but reports he was rushing across a crosswalk this weekend as the light was chancing and states walking fast was extremely difficult for him and he almost lost his balance.   Pt accompanied by: self  PERTINENT HISTORY: 66 year old male here for evaluation of gait and balance difficulty and memory loss. Symptoms have been present since 2021. He has developed numbness in his feet, gait and balance difficulty. Has difficulty in the shower when he closes eyes. He feels like he is drunk when he walks. He tends to lose his balance. He is fallen 10 times last few months.  PMH:  Arthritis, hx of pancreas cancer, depression, hx of subdural hematoma , hx of hip fracture ~13 yrs ago   PAIN:  Are you having pain? No  PRECAUTIONS: Fall  WEIGHT BEARING RESTRICTIONS: No  FALLS: Has patient fallen in last 6 months? Yes. Number of falls >10 falls   LIVING ENVIRONMENT: Lives with: lives with their spouse and and wife's mother Lives in: House/apartment Stairs: No Has following equipment at home: Single point cane and Grab bars - has suction cup grab bars   PLOF: Independent  PATIENT GOALS: Wants to improve balance.   OBJECTIVE:   DIAGNOSTIC FINDINGS:  03/27/20 MRI BRAIN 1. Positive for diffuse bilateral subdural hematoma versus dural thickening, 2-3 mm in thickness throughout both the entire posterior fossa and bilateral supratentorial brain. Follow-up noncontrast Head CT might be able to confirm hyperdense blood products, although small SDH this size will frequently be occult by CT. Follow-up Post-contrast brain MRI would be valuable although a degree of dural thickening is typically seen with SDH. Otherwise CSF analysis might also be valuable. 2. No significant intracranial mass effect or other acute intracranial abnormality. 3. MRAs today are reported separately.  Brain and lumbar spine MRI scheduled for 06/09/22   COGNITION: Overall cognitive status: Within functional limits for tasks assessed, however pt reporting some memory issues.    SENSATION: Light touch: Reports numbness/tingling in feet and in thighs, with light touch pt more impaired distally in RLE  Proprioception: WFL at bilat ankles   COORDINATION: Heel to shin: WFL bilat   LOWER EXTREMITY MMT:    MMT Right Eval Left Eval  Hip flexion 4/5 4-/5  Hip extension    Hip abduction 5/5 5/5  Hip adduction 5/5 5/5  Hip internal rotation    Hip external rotation    Knee flexion 5/5 5/5  Knee extension 4/5 4+/5  Ankle dorsiflexion 3-/5 3-/5  Ankle plantarflexion    Ankle inversion     Ankle eversion    (Blank rows = not tested)  Strength testing performed in sitting.   TRANSFERS: Assistive device utilized: None  Sit to stand: SBA Stand to sit: SBA From standard chair can perform without UE support, but reports at home has difficulty getting up from lower surfaces like the couch.   GAIT: Gait pattern: decreased ankle dorsiflexion- Right, decreased ankle dorsiflexion- Left, Right steppage, Left steppage, narrow BOS, poor foot clearance- Right, and poor foot clearance- Left Distance walked: Clinic distances  Assistive device utilized: None Level of assistance: SBA and CGA Comments: Pt with bilat foot drop and unsteadiness with gait with some staggering requiring CGA. Pt with tendency to almost scissor at  times and have a narrow BOS.   FUNCTIONAL TESTS:  5 times sit to stand: 15.63 seconds with no UE support  Timed up and go (TUG): 9 seconds, cog TUG: 14.85 seconds 10 meter walk test: 11.16 seconds = 2.93 ft/sec with no AD   Feet together EO on level ground: 30 seconds, on air ex: 30 seconds    M-CTSIB (with feet hip width distance)  Condition 1: Firm Surface, EO 30 Sec, Normal Sway  Condition 2: Firm Surface, EC 30 Sec, Mild Sway, pt shifting weight posteriorly on heels  Condition 3: Foam Surface, EO 30 Sec, Mild Sway  Condition 4: Foam Surface, EC 3 Sec, pt losing balance to the R.      TODAY'S TREATMENT:                                                                                                                              DATE: 06/03/22   Peformed DGI: see below for details  Neuro Rehab:  - Narrow stance balance 1x30 seconds  - Tandem stance balance (B) 1x30 seconds  - Single leg balance (B) 1x30 seconds  - Airex Foam Pad Regular Stance balance 1x30 seconds  - Airex Foam Pad Marching 1x30 seconds  - Sit to stand (5x table high, 5x with table at lowest height)   PATIENT EDUCATION: Education details: Clinical findings, POC. When discussing POC;  pt reports that he lives in [place] and would prefer to get PT at [facility] as it is closer to his house. PT discussed with front desk to try to get pt scheduled over there. Educated that due to ankle DF weakness and foot drop, pt may benefit from AFOs/foot up braces to help with foot drop during gait for improved gait mechanics. Will have to trial at future sessions. Discussed no getting on ladders and that pt would benefit from an AD (will trial different ones) to help prevent future falls.  Person educated: Patient Education method: Explanation Education comprehension: verbalized understanding  HOME EXERCISE PROGRAM:  Access Code: [email or web address] URL: [email or web address] Date: 06/03/2022 Prepared by: Janna Arch Exercises - Seated Long Arc Quad  - 1 x daily - 5 x weekly - 3 sets - 10 reps - 5 hold - Seated Toe Raise  - 1 x daily - 5 x weekly - 3 sets - 10 reps - Sit to Stand Without Arm Support  - 1 x daily - 5 x weekly - 2 sets - 10 reps - Romberg Stance with Eyes Closed  - 1 x daily - 5 x weekly - 1 sets - 3 reps - 30 hold - Tandem Stance  - 1 x daily - 5 x weekly - 1 sets - 3 reps - 30 hold - Standing Single Leg Stance with Counter Support  - 1 x daily - 5 x weekly - 1 sets - 3 reps - 30 hold   GOALS: Goals reviewed with patient? Yes  SHORT  TERM GOALS: Target date: 06/26/2022  Pt will be independent with initial HEP in order to build upon functional gains made in therapy. Baseline: Goal status:   2.  Patient will increase Dynamic Gait Index to >19/24 as to demonstrate reduced falls risk and improved dynamic gait balance for better safety with community/home ambulation.  Baseline: 06/03/22: 12/24 Goal status: INITIAL  3.  Pt will improve 5x sit<>stand to less than or equal to 14 sec to demonstrate improved functional strength and transfer efficiency.  Baseline: 15.63 seconds with no UE support Goal status: INITIAL  4.  Pt will ambulate at least 300' over level  indoor/outdoor paved surfaces with supervision and LRAD in order to demo improved safety with mobility.  Baseline: No AD over level surfaces, supervision/CGA and unsteadiness.  Goal status: INITIAL   LONG TERM GOALS: Target date: 07/24/2022  Pt will be independent with final HEP in order to build upon functional gains made in therapy. Baseline:  Goal status: INITIAL  2.  Patient will increase dynamic gait index score to >19/24 as to demonstrate reduced fall risk and improved dynamic gait balance for better safety with community/home ambulation.  Baseline: 06/03/22: 12/24 Goal status: INITIAL  3.  Pt will improve condition 4 of mCTSIB to at least 12 seconds in order to demo improved vestibular input for balance.  Baseline: 3 seconds, pt losing balance to R.  Goal status: INITIAL  4.  Pt will improve gait speed with LRAD or appropriate braces/no AD to at least 3.3 ft/sec in order to demo improved community mobility.   Baseline: 11.16 seconds = 2.93 ft/sec with no AD  Goal status: INITIAL  5.  Pt will ambulate at least 500' outdoors over paved/grass surfaces with LRAD and supervision in order to demo improved community mobility.  Baseline:  Goal status: INITIAL   ASSESSMENT:  CLINICAL IMPRESSION:  The patient was 15 minutes late to skilled therapy because they got lost trying to find the clinic. As a result treatment was limited today and time didn't permit for proper education of the patient's HEP so next visit will include HEP demonstration and prescription. The patient displayed difficultly maintaining their balance with dynamic gait conditions on the DGI and during standing balance progressions. The patient has moderate-severe balance deficits that are alarming for community ambulation the patient reports taking part in. The patient required support during all standing balance exercises and at this time is unable to perform single leg balance and balance with EC without severe sway  and loss of balance. The patient will benefit from skilled PT to address these impairments and functional limitations to maximize functional mobility independence and decrease their fall risk.   OBJECTIVE IMPAIRMENTS: Abnormal gait, decreased activity tolerance, decreased balance, decreased knowledge of use of DME, difficulty walking, decreased ROM, decreased strength, and impaired sensation.   ACTIVITY LIMITATIONS: bending, stairs, transfers, and locomotion level  PARTICIPATION LIMITATIONS: community activity and yard work  PERSONAL FACTORS: Past/current experiences, Time since onset of injury/illness/exacerbation, 3+ comorbidities: Arthritis, hx of pancreas cancer, depression, hx of subdural hematoma , hx of hip fracture ~13 yrs ago , and frequent falls (>10 in the past 6 months)  are also affecting patient's functional outcome.   REHAB POTENTIAL: Good  CLINICAL DECISION MAKING: Evolving/moderate complexity  EVALUATION COMPLEXITY: Moderate  PLAN:  PT FREQUENCY: 2x/week  PT DURATION: 8 weeks  PLANNED INTERVENTIONS: Therapeutic exercises, Therapeutic activity, Neuromuscular re-education, Balance training, Gait training, Patient/Family education, Self Care, Stair training, Vestibular training, Orthotic/Fit training, DME instructions,  and Re-evaluation  PLAN FOR NEXT SESSION: Initial HEP for functional strength/balance tasks. Pt would benefit from trial of canes for AD and try a PLS AFO or foot up braces in a future session for foot drop. Work on balance progressions and dynamic gait.   Sudie Bailey, SPT  This entire session was performed under direct supervision and direction of a licensed therapist/therapist assistant . I have personally read, edited and approve of the note as written.  Janna Arch, PT, DPT  06/03/2022, 4:54 PM

## 2022-06-04 ENCOUNTER — Encounter: Payer: Self-pay | Admitting: Psychiatry

## 2022-06-04 ENCOUNTER — Telehealth (INDEPENDENT_AMBULATORY_CARE_PROVIDER_SITE_OTHER): Payer: BC Managed Care – PPO | Admitting: Psychiatry

## 2022-06-04 DIAGNOSIS — R5383 Other fatigue: Secondary | ICD-10-CM | POA: Diagnosis not present

## 2022-06-04 DIAGNOSIS — F411 Generalized anxiety disorder: Secondary | ICD-10-CM | POA: Diagnosis not present

## 2022-06-04 DIAGNOSIS — F330 Major depressive disorder, recurrent, mild: Secondary | ICD-10-CM

## 2022-06-04 MED ORDER — BUPROPION HCL ER (XL) 150 MG PO TB24: 450.0000 mg | ORAL_TABLET | Freq: Every day | ORAL | 2 refills | Status: DC

## 2022-06-04 MED ORDER — ESCITALOPRAM OXALATE 20 MG PO TABS: 20.0000 mg | ORAL_TABLET | Freq: Every day | ORAL | 2 refills | Status: DC

## 2022-06-04 NOTE — Patient Instructions (Signed)
Continue bupropion 450 mg daily  Continue escitalopram 20 mg daily  Next appointment: 2/7 at 8:2

## 2022-06-05 ENCOUNTER — Ambulatory Visit: Payer: BC Managed Care – PPO

## 2022-06-05 DIAGNOSIS — M6281 Muscle weakness (generalized): Secondary | ICD-10-CM

## 2022-06-05 DIAGNOSIS — R2689 Other abnormalities of gait and mobility: Secondary | ICD-10-CM

## 2022-06-05 DIAGNOSIS — R296 Repeated falls: Secondary | ICD-10-CM

## 2022-06-05 DIAGNOSIS — R2681 Unsteadiness on feet: Secondary | ICD-10-CM

## 2022-06-05 NOTE — Therapy (Signed)
OUTPATIENT PHYSICAL THERAPY NEURO TREATMENT  Patient Name: Jared [name] MRN: [id number] DOB:Nov 07, 1955, 66 y.o., male Today's Date: 06/05/2022  PCP:  [name], MD  REFERRING PROVIDER: [name], MD   PT End of Session - 06/05/22 1603     Visit Number 3    Number of Visits 17    Date for PT Re-Evaluation 07/28/22    Authorization Type BCBS    PT Start Time 1603    PT Stop Time 1645    PT Time Calculation (min) 42 min    Equipment Utilized During Treatment Gait belt    Activity Tolerance Patient tolerated treatment well    Behavior During Therapy WFL for tasks assessed/performed            Past Medical History:  Diagnosis Date   Arthritis    Cancer ([place])    pancreas   Complication of anesthesia    tends to come out of anesthesia during procedure   Depression    Dysphagia    Elevated lipids    GERD (gastroesophageal reflux disease)    H/O blood clots    superficial blood clot in left leg below knee tx w/ eliquis x 3 mo   History of kidney stones    Kidney disease    Neuroendocrine tumor    Subdural hematoma ([facility])    hx of   Tobacco abuse    Unsteadiness    Past Surgical History:  Procedure Laterality Date   ABDOMINAL SURGERY     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   CHOLECYSTECTOMY     added umbilical mesh   COLONOSCOPY WITH PROPOFOL N/A 03/27/2017   Procedure: COLONOSCOPY WITH PROPOFOL;  Surgeon: Manya Silvas, MD;  Location: [facility] ENDOSCOPY;  Service: Endoscopy;  Laterality: N/A;   ESOPHAGOGASTRODUODENOSCOPY (EGD) WITH PROPOFOL N/A 03/27/2017   Procedure: ESOPHAGOGASTRODUODENOSCOPY (EGD) WITH PROPOFOL;  Surgeon: Manya Silvas, MD;  Location: [facility] ENDOSCOPY;  Service: Endoscopy;  Laterality: N/A;   hip fracture with rod placement Right    MASS EXCISION Left 05/24/2021   Procedure: EXCISION MASS;  Surgeon: Benjamine Sprague, DO;  Location: [facility] ORS;  Service: General;  Laterality: Left;  Prone postition   Patient Active Problem  List   Diagnosis Date Noted   AAA (abdominal aortic aneurysm) without rupture ([place]) 01/02/2022   RLS (restless legs syndrome) 09/19/2020   Varicose veins of both lower extremities 09/19/2020   TIA (transient ischemic attack) 01/01/2020   History of TIA (transient ischemic attack) 12/27/2019   PVC's (premature ventricular contractions) 12/27/2019   Pill dysphagia 03/17/2017   Hx of colonic polyps 12/05/2016   Irregular heart rate 12/05/2016   Hyperlipidemia 09/07/2015   Tobacco abuse 09/07/2015   Calculus, kidney 04/02/2015   Neuroendocrine tumor 06/22/2008   ONSET DATE: 05/19/2022  REFERRING DIAG: R26.9 (ICD-10-CM) - Gait difficulty R41.3 (ICD-10-CM) - Memory loss  THERAPY DIAG:  Unsteadiness on feet  Repeated falls  Muscle weakness (generalized)  Other abnormalities of gait and mobility  Rationale for Evaluation and Treatment: Rehabilitation  SUBJECTIVE:  SUBJECTIVE STATEMENT:  Pt is ambulating into the clinic with a Spc that he received after having a hip replacement back in 2010.  Pt notes he had a fall yesterday when he was walking down steep stairs outside the house.  Pt fell down 2-3 total steps and is sore today.  Pt notes he hit his tailbone and his head on concrete.  Pt is noting increased pain in the coccyx region and in the L sided ribs.  Pt accompanied by: self  PERTINENT HISTORY: 66 year old male here for evaluation of gait and balance difficulty and memory loss. Symptoms have been present since 2021. He has developed numbness in his feet, gait and balance difficulty. Has difficulty in the shower when he closes eyes. He feels like he is drunk when he walks. He tends to lose his balance. He is fallen 10 times last few months.  PMH: Arthritis, hx of pancreas cancer, depression, hx  of subdural hematoma , hx of hip fracture ~13 yrs ago   PAIN:  Are you having pain? Yes: NPRS scale: 4/10 Pain location: coccyx and L sided rib Pain description: Soreness where he fell   PRECAUTIONS: Fall  WEIGHT BEARING RESTRICTIONS: No  FALLS: Has patient fallen in last 6 months? Yes. Number of falls >10 falls   LIVING ENVIRONMENT: Lives with: lives with their spouse and and wife's mother Lives in: House/apartment Stairs: No Has following equipment at home: Single point cane and Grab bars - has suction cup grab bars   PLOF: Independent  PATIENT GOALS: Wants to improve balance.   OBJECTIVE:   DIAGNOSTIC FINDINGS:  03/27/20 MRI BRAIN 1. Positive for diffuse bilateral subdural hematoma versus dural thickening, 2-3 mm in thickness throughout both the entire posterior fossa and bilateral supratentorial brain. Follow-up noncontrast Head CT might be able to confirm hyperdense blood products, although small SDH this size will frequently be occult by CT. Follow-up Post-contrast brain MRI would be valuable although a degree of dural thickening is typically seen with SDH. Otherwise CSF analysis might also be valuable. 2. No significant intracranial mass effect or other acute intracranial abnormality. 3. MRAs today are reported separately.  Brain and lumbar spine MRI scheduled for 06/09/22   COGNITION: Overall cognitive status: Within functional limits for tasks assessed, however pt reporting some memory issues.    SENSATION: Light touch: Reports numbness/tingling in feet and in thighs, with light touch pt more impaired distally in RLE  Proprioception: WFL at bilat ankles   COORDINATION: Heel to shin: WFL bilat   LOWER EXTREMITY MMT:    MMT Right Eval Left Eval  Hip flexion 4/5 4-/5  Hip extension    Hip abduction 5/5 5/5  Hip adduction 5/5 5/5  Hip internal rotation    Hip external rotation    Knee flexion 5/5 5/5  Knee extension 4/5 4+/5  Ankle dorsiflexion 3-/5  3-/5  Ankle plantarflexion    Ankle inversion    Ankle eversion    (Blank rows = not tested)  Strength testing performed in sitting.   TRANSFERS: Assistive device utilized: None  Sit to stand: SBA Stand to sit: SBA From standard chair can perform without UE support, but reports at home has difficulty getting up from lower surfaces like the couch.   GAIT: Gait pattern: decreased ankle dorsiflexion- Right, decreased ankle dorsiflexion- Left, Right steppage, Left steppage, narrow BOS, poor foot clearance- Right, and poor foot clearance- Left Distance walked: Clinic distances  Assistive device utilized: None Level of assistance: SBA and CGA Comments:  Pt with bilat foot drop and unsteadiness with gait with some staggering requiring CGA. Pt with tendency to almost scissor at times and have a narrow BOS.   FUNCTIONAL TESTS:  5 times sit to stand: 15.63 seconds with no UE support  Timed up and go (TUG): 9 seconds, cog TUG: 14.85 seconds 10 meter walk test: 11.16 seconds = 2.93 ft/sec with no AD   Feet together EO on level ground: 30 seconds, on air ex: 30 seconds    M-CTSIB (with feet hip width distance)  Condition 1: Firm Surface, EO 30 Sec, Normal Sway  Condition 2: Firm Surface, EC 30 Sec, Mild Sway, pt shifting weight posteriorly on heels  Condition 3: Foam Surface, EO 30 Sec, Mild Sway  Condition 4: Foam Surface, EC 3 Sec, pt losing balance to the R.      TODAY'S TREATMENT:                                                                                                                              DATE: 06/05/22     Neuro:  Static stance on airex pad, 30 sec bouts Static narrow stance on airex pad, 30 sec bouts Static stance with eyes closed on airex pad, 30 sec bouts Static narrow stance with eyes close on airex pad, 30 sec bouts  VOR x1 while standing on firm surface, 30 sec bouts caused pt to become nauseous; recommending vestibular assessment during future  session  Proper use of SPC and adjustment of current cane in order to have proper sequencing for motor control.   PATIENT EDUCATION: Education details: Clinical findings, POC. When discussing POC; pt reports that he lives in [place] and would prefer to get PT at [facility] as it is closer to his house. PT discussed with front desk to try to get pt scheduled over there. Educated that due to ankle DF weakness and foot drop, pt may benefit from AFOs/foot up braces to help with foot drop during gait for improved gait mechanics. Will have to trial at future sessions. Discussed no getting on ladders and that pt would benefit from an AD (will trial different ones) to help prevent future falls.  Person educated: Patient Education method: Explanation Education comprehension: verbalized understanding  HOME EXERCISE PROGRAM:  Access Code: [email or web address] URL: [email or web address] Date: 06/03/2022 Prepared by: Janna Arch Exercises - Seated Long Arc Quad  - 1 x daily - 5 x weekly - 3 sets - 10 reps - 5 hold - Seated Toe Raise  - 1 x daily - 5 x weekly - 3 sets - 10 reps - Sit to Stand Without Arm Support  - 1 x daily - 5 x weekly - 2 sets - 10 reps - Romberg Stance with Eyes Closed  - 1 x daily - 5 x weekly - 1 sets - 3 reps - 30 hold - Tandem Stance  - 1 x daily - 5 x weekly - 1 sets -  3 reps - 30 hold - Standing Single Leg Stance with Counter Support  - 1 x daily - 5 x weekly - 1 sets - 3 reps - 30 hold   GOALS: Goals reviewed with patient? Yes  SHORT TERM GOALS: Target date: 06/26/2022  Pt will be independent with initial HEP in order to build upon functional gains made in therapy. Baseline: Goal status:   2.  Patient will increase Dynamic Gait Index to >19/24 as to demonstrate reduced falls risk and improved dynamic gait balance for better safety with community/home ambulation.  Baseline: 06/03/22: 12/24 Goal status: INITIAL  3.  Pt will improve 5x sit<>stand to less than or  equal to 14 sec to demonstrate improved functional strength and transfer efficiency.  Baseline: 15.63 seconds with no UE support Goal status: INITIAL  4.  Pt will ambulate at least 300' over level indoor/outdoor paved surfaces with supervision and LRAD in order to demo improved safety with mobility.  Baseline: No AD over level surfaces, supervision/CGA and unsteadiness.  Goal status: INITIAL   LONG TERM GOALS: Target date: 07/24/2022  Pt will be independent with final HEP in order to build upon functional gains made in therapy. Baseline:  Goal status: INITIAL  2.  Patient will increase dynamic gait index score to >19/24 as to demonstrate reduced fall risk and improved dynamic gait balance for better safety with community/home ambulation.  Baseline: 06/03/22: 12/24 Goal status: INITIAL  3.  Pt will improve condition 4 of mCTSIB to at least 12 seconds in order to demo improved vestibular input for balance.  Baseline: 3 seconds, pt losing balance to R.  Goal status: INITIAL  4.  Pt will improve gait speed with LRAD or appropriate braces/no AD to at least 3.3 ft/sec in order to demo improved community mobility.   Baseline: 11.16 seconds = 2.93 ft/sec with no AD  Goal status: INITIAL  5.  Pt will ambulate at least 500' outdoors over paved/grass surfaces with LRAD and supervision in order to demo improved community mobility.  Baseline:  Goal status: INITIAL   ASSESSMENT:  CLINICAL IMPRESSION:  Pt put forth good effort today, but had bouts of becoming nauseous with standing from sitting position and performing VOR x1.  Pt educated on potential vestibular symptoms and would like to be screened at future session.  Pt agreeable to screen at an upcoming appointment.  Therapist adjusted cane to be at greater height, but still not where pt needs due to pt's height.  Would advise pt to have taller [facility] and pt agreeable.   Pt will continue to benefit from skilled therapy to address remaining  deficits in order to improve overall QoL and return to PLOF.     OBJECTIVE IMPAIRMENTS: Abnormal gait, decreased activity tolerance, decreased balance, decreased knowledge of use of DME, difficulty walking, decreased ROM, decreased strength, and impaired sensation.   ACTIVITY LIMITATIONS: bending, stairs, transfers, and locomotion level  PARTICIPATION LIMITATIONS: community activity and yard work  PERSONAL FACTORS: Past/current experiences, Time since onset of injury/illness/exacerbation, 3+ comorbidities: Arthritis, hx of pancreas cancer, depression, hx of subdural hematoma , hx of hip fracture ~13 yrs ago , and frequent falls (>10 in the past 6 months)  are also affecting patient's functional outcome.   REHAB POTENTIAL: Good  CLINICAL DECISION MAKING: Evolving/moderate complexity  EVALUATION COMPLEXITY: Moderate  PLAN:  PT FREQUENCY: 2x/week  PT DURATION: 8 weeks  PLANNED INTERVENTIONS: Therapeutic exercises, Therapeutic activity, Neuromuscular re-education, Balance training, Gait training, Patient/Family education, Self Care, Stair training,  Vestibular training, Orthotic/Fit training, DME instructions, and Re-evaluation  PLAN FOR NEXT SESSION: Initial HEP for functional strength/balance tasks. Pt would benefit from trial of canes for AD and try a PLS AFO or foot up braces in a future session for foot drop. Work on balance progressions and dynamic gait.    Gwenlyn Saran, PT, DPT Physical Therapist- [facility]  06/05/22, 4:04 PM

## 2022-06-09 ENCOUNTER — Encounter: Payer: Self-pay | Admitting: Neurology

## 2022-06-09 ENCOUNTER — Ambulatory Visit
Admission: RE | Admit: 2022-06-09 | Discharge: 2022-06-09 | Disposition: A | Discharge status: Home / Self Care | Payer: BC Managed Care – PPO | Source: Ambulatory Visit | Attending: Diagnostic Neuroimaging | Admitting: Diagnostic Neuroimaging

## 2022-06-09 ENCOUNTER — Ambulatory Visit (INDEPENDENT_AMBULATORY_CARE_PROVIDER_SITE_OTHER): Payer: BC Managed Care – PPO | Admitting: Licensed Clinical Social Worker

## 2022-06-09 DIAGNOSIS — R269 Unspecified abnormalities of gait and mobility: Secondary | ICD-10-CM | POA: Diagnosis not present

## 2022-06-09 DIAGNOSIS — F411 Generalized anxiety disorder: Secondary | ICD-10-CM

## 2022-06-09 DIAGNOSIS — F330 Major depressive disorder, recurrent, mild: Secondary | ICD-10-CM | POA: Diagnosis not present

## 2022-06-09 DIAGNOSIS — R413 Other amnesia: Secondary | ICD-10-CM

## 2022-06-09 MED ORDER — GADOPICLENOL 0.5 MMOL/ML IV SOLN
10.0000 mL | Freq: Once | INTRAVENOUS | Status: AC | PRN
  Administered 2022-06-09: 10 mL via INTRAVENOUS

## 2022-06-09 NOTE — Plan of Care (Signed)
  Problem: Depression CCP Problem  1 Decrease depressive symptoms and improve levels of effective functioning-pt reports a decrease in overall depression symptoms 3 out of 5 sessions documented.  Goal: LTG: Reduce frequency, intensity, and duration of depression symptoms as evidenced by: pt self report 3 out of 5 sessions documented Outcome: Progressing Goal: STG: Jared Saxon "[name]" WILL PARTICIPATE IN AT LEAST 80% OF SCHEDULED INDIVIDUAL PSYCHOTHERAPY SESSIONS Outcome: Progressing Intervention: REVIEW PLEASE SKILLS (TREAT PHYSICAL ILLNESS, BALANCE EATING, AVOID MOOD-ALTERING SUBSTANCES, BALANCE SLEEP AND GET EXERCISE) WITH Jared Saxon "[name]" Note: Reviewed  Intervention: Assist with coping skills and behavior Note: Reviewed  Intervention: Encourage verbalization of feelings/concerns/expectations Description: Allowed/encourageed Note: Allowed/encouraged

## 2022-06-09 NOTE — Progress Notes (Signed)
Virtual Visit via Video Note  I connected with [name] on 06/09/22 at  9:00 AM EST by a video enabled telemedicine application and verified that I am speaking with the correct person using two identifiers.  Location: Patient: home Provider: remote office [place], [place])   I discussed the limitations of evaluation and management by telemedicine and the availability of in person appointments. The patient expressed understanding and agreed to proceed.   I discussed the assessment and treatment plan with the patient. The patient was provided an opportunity to ask questions and all were answered. The patient agreed with the plan and demonstrated an understanding of the instructions.   The patient was advised to call back or seek an in-person evaluation if the symptoms worsen or if the condition fails to improve as anticipated.  I provided 22 minutes of non-face-to-face time during this encounter.   [place], LCSW   THERAPIST PROGRESS NOTE  Session Time: 9-922a  Participation Level: Active  Behavioral Response: Neat and Well GroomedAlertDepressed  Type of Therapy: Individual Therapy  Treatment Goals addressed:   Problem: Depression CCP Problem  1 Decrease depressive symptoms and improve levels of effective functioning-pt reports a decrease in overall depression symptoms 3 out of 5 sessions documented.  Goal: LTG: Reduce frequency, intensity, and duration of depression symptoms as evidenced by: pt self report 3 out of 5 sessions documented Outcome: Progressing Goal: STG: Jared Saxon "Hiram [name]" WILL PARTICIPATE IN AT LEAST 80% OF SCHEDULED INDIVIDUAL PSYCHOTHERAPY SESSIONS Outcome: Progressing Intervention: REVIEW PLEASE SKILLS (TREAT PHYSICAL ILLNESS, BALANCE EATING, AVOID MOOD-ALTERING SUBSTANCES, BALANCE SLEEP AND GET EXERCISE) WITH Jared Saxon "Hiram [name]" Note: Reviewed  Intervention: Assist with coping skills and behavior Note: Reviewed  Intervention:  Encourage verbalization of feelings/concerns/expectations Description: Allowed/encourageed Note: Allowed/encouraged   ProgressTowards Goals: Progressing--still triggered by health-related issues still ongoing at time of session  Interventions: CBT  Summary: Jared [name] is a 66 y.o. male who presents with improving symptoms related to depression.Pt reports good quality and quantity of sleep.    Pt reports that he is compliant with his medication but is concerned about his recent increase in falling. Pt reports that he has fallen multiple times (10+ times) in the past 2 months and is concerned. Pt is currently under the care of a neurologist and is having nerve conduction testing and MRI's to assess situation. Pt feels that increase in the dosage of bupropion is something that could potentially be a variable to consider and pt wants to decrease his dose or discontinue the medication. Pt spoke with his psychiatrist Dr. Modesta Messing about this and she discouraged him from d/c meds. Pt states he will also speak with neurologist at his next appt.  .  Patient reports that overall he feels that he is managing mood well, and he feels his overall mood is stable. Patient also reports that he feels that he is managing situational stressors as well. Reiterated importance of overall self-care and stress management.  Continued recommendations are as follows: self care behaviors, positive social engagements, focusing on overall work/home/life balance, and focusing on positive physical and emotional wellness.   Suicidal/Homicidal: No  Therapist Response: Pt is continuing to apply interventions learned in session into daily life situations. Pt is currently on track to meet goals utilizing interventions mentioned above. Personal growth and progress noted. Progress intermittent/fluctuating at time of session. Treatment to continue as indicated.   Plan: Return again in 3 mo  Diagnosis:  Encounter Diagnoses  Name  Primary?  MDD (major depressive disorder), recurrent episode, mild ([place]) Yes   Anxiety state     Collaboration of Care: Other pt encouraged to continue care with psychiatrist of record, Dr. Ursula Alert  Patient/Guardian was advised Release of Information must be obtained prior to any record release in order to collaborate their care with an outside provider. Patient/Guardian was advised if they have not already done so to contact the registration department to sign all necessary forms in order for [place] to release information regarding their care.   Consent: Patient/Guardian gives verbal consent for treatment and assignment of benefits for services provided during this visit. Patient/Guardian expressed understanding and agreed to proceed.   [place], LCSW 06/09/2022

## 2022-06-10 ENCOUNTER — Ambulatory Visit: Payer: BC Managed Care – PPO

## 2022-06-10 DIAGNOSIS — R2681 Unsteadiness on feet: Secondary | ICD-10-CM | POA: Diagnosis not present

## 2022-06-10 DIAGNOSIS — R296 Repeated falls: Secondary | ICD-10-CM

## 2022-06-10 DIAGNOSIS — M6281 Muscle weakness (generalized): Secondary | ICD-10-CM

## 2022-06-10 NOTE — Therapy (Signed)
OUTPATIENT PHYSICAL THERAPY NEURO TREATMENT  Patient Name: Jared [name] MRN: [id number] DOB:11/05/1955, 66 y.o., male Today's Date: 06/10/2022  PCP:  [name], MD  REFERRING PROVIDER: [name], MD   Past Medical History:  Diagnosis Date   Arthritis    Cancer [facility])    pancreas   Complication of anesthesia    tends to come out of anesthesia during procedure   Depression    Dysphagia    Elevated lipids    GERD (gastroesophageal reflux disease)    H/O blood clots    superficial blood clot in left leg below knee tx w/ eliquis x 3 mo   History of kidney stones    Kidney disease    Neuroendocrine tumor    Subdural hematoma (HCC)    hx of   Tobacco abuse    Unsteadiness    Past Surgical History:  Procedure Laterality Date   ABDOMINAL SURGERY     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   CHOLECYSTECTOMY     added umbilical mesh   COLONOSCOPY WITH PROPOFOL N/A 03/27/2017   Procedure: COLONOSCOPY WITH PROPOFOL;  Surgeon: Manya Silvas, MD;  Location: [facility] ENDOSCOPY;  Service: Endoscopy;  Laterality: N/A;   ESOPHAGOGASTRODUODENOSCOPY (EGD) WITH PROPOFOL N/A 03/27/2017   Procedure: ESOPHAGOGASTRODUODENOSCOPY (EGD) WITH PROPOFOL;  Surgeon: Manya Silvas, MD;  Location: [facility] ENDOSCOPY;  Service: Endoscopy;  Laterality: N/A;   hip fracture with rod placement Right    MASS EXCISION Left 05/24/2021   Procedure: EXCISION MASS;  Surgeon: Benjamine Sprague, DO;  Location: [facility] ORS;  Service: General;  Laterality: Left;  Prone postition   Patient Active Problem List   Diagnosis Date Noted   AAA (abdominal aortic aneurysm) without rupture ([place]) 01/02/2022   RLS (restless legs syndrome) 09/19/2020   Varicose veins of both lower extremities 09/19/2020   TIA (transient ischemic attack) 01/01/2020   History of TIA (transient ischemic attack) 12/27/2019   PVC's (premature ventricular contractions) 12/27/2019   Pill dysphagia 03/17/2017   Hx of colonic polyps  12/05/2016   Irregular heart rate 12/05/2016   Hyperlipidemia 09/07/2015   Tobacco abuse 09/07/2015   Calculus, kidney 04/02/2015   Neuroendocrine tumor 06/22/2008   ONSET DATE: 05/19/2022  REFERRING DIAG: R26.9 (ICD-10-CM) - Gait difficulty R41.3 (ICD-10-CM) - Memory loss  THERAPY DIAG:  No diagnosis found.  Rationale for Evaluation and Treatment: Rehabilitation  SUBJECTIVE:                                                                                                                                                                                             SUBJECTIVE STATEMENT:  Patient reports they are experiencing a significant amount of pain in their (R) adductor today and reports they have been progressively feeling worse since their fall last week. Patient reports pain in their (L) adductor currently as a 1-2/10 and at worst in the last week a 6-7/10 on the NPRS. Compression in L4 and L5. The patient also reports increased pain in the coccyx region and in the L sided ribs today. The patient reports they threw up 1x in the parking lot, and 2x the day after last session when they performed some vestibular exercises. Patient received a brain MRI and lumbar MRI last night. (Imaging results updated below).  Pt accompanied by: self  PERTINENT HISTORY: 66 year old male here for evaluation of gait and balance difficulty and memory loss. Symptoms have been present since 2021. He has developed numbness in his feet, gait and balance difficulty. Has difficulty in the shower when he closes eyes. He feels like he is drunk when he walks. He tends to lose his balance. He is fallen 10 times last few months.  PMH: Arthritis, hx of pancreas cancer, depression, hx of subdural hematoma , hx of hip fracture ~13 yrs ago   PAIN:  Are you having pain? Yes: NPRS scale: 6-7/10 Pain location: coccyx and L sided rib Pain description: Soreness where he fell   PRECAUTIONS: Fall  WEIGHT BEARING  RESTRICTIONS: No  FALLS: Has patient fallen in last 6 months? Yes. Number of falls >10 falls   LIVING ENVIRONMENT: Lives with: lives with their spouse and and wife's mother Lives in: House/apartment Stairs: No Has following equipment at home: Single point cane and Grab bars - has suction cup grab bars   PLOF: Independent  PATIENT GOALS: Wants to improve balance.   OBJECTIVE:   DIAGNOSTIC FINDINGS:  03/27/20 MRI BRAIN 1. Positive for diffuse bilateral subdural hematoma versus dural thickening, 2-3 mm in thickness throughout both the entire posterior fossa and bilateral supratentorial brain. Follow-up noncontrast Head CT might be able to confirm hyperdense blood products, although small SDH this size will frequently be occult by CT. Follow-up Post-contrast brain MRI would be valuable although a degree of dural thickening is typically seen with SDH. Otherwise CSF analysis might also be valuable. 2. No significant intracranial mass effect or other acute intracranial abnormality. 3. MRAs today are reported separately.  Brain and lumbar spine MRI scheduled for 06/09/22  IMPRESSION: This MRI of the lumbar spine without contrast shows the following: 1. At L4-L5, there is severe loss of disc height and other degenerative change causing moderately severe foraminal narrowing and moderate lateral recess stenosis.  There is potential for L4 nerve root compression.  There is no spinal stenosis. 2. At L5-S1, there are degenerative changes causing moderately severe foraminal narrowing and moderate lateral recess stenosis.  There is potential for L5 nerve root compression to either side.  There is no spinal stenosis. 3. Milder degenerative changes at the other lumbar levels do not lead to spinal stenosis or nerve root compression.   COGNITION: Overall cognitive status: Within functional limits for tasks assessed, however pt reporting some memory issues.    SENSATION: Light touch: Reports  numbness/tingling in feet and in thighs, with light touch pt more impaired distally in RLE  Proprioception: WFL at bilat ankles   COORDINATION: Heel to shin: WFL bilat   LOWER EXTREMITY MMT:    MMT Right Eval Left Eval  Hip flexion 4/5 4-/5  Hip extension    Hip abduction 5/5 5/5  Hip adduction 5/5  5/5  Hip internal rotation    Hip external rotation    Knee flexion 5/5 5/5  Knee extension 4/5 4+/5  Ankle dorsiflexion 3-/5 3-/5  Ankle plantarflexion    Ankle inversion    Ankle eversion    (Blank rows = not tested)  Strength testing performed in sitting.   TRANSFERS: Assistive device utilized: None  Sit to stand: SBA Stand to sit: SBA From standard chair can perform without UE support, but reports at home has difficulty getting up from lower surfaces like the couch.   GAIT: Gait pattern: decreased ankle dorsiflexion- Right, decreased ankle dorsiflexion- Left, Right steppage, Left steppage, narrow BOS, poor foot clearance- Right, and poor foot clearance- Left Distance walked: Clinic distances  Assistive device utilized: None Level of assistance: SBA and CGA Comments: Pt with bilat foot drop and unsteadiness with gait with some staggering requiring CGA. Pt with tendency to almost scissor at times and have a narrow BOS.   FUNCTIONAL TESTS:  5 times sit to stand: 15.63 seconds with no UE support  Timed up and go (TUG): 9 seconds, cog TUG: 14.85 seconds 10 meter walk test: 11.16 seconds = 2.93 ft/sec with no AD   Feet together EO on level ground: 30 seconds, on air ex: 30 seconds  M-CTSIB (with feet hip width distance)  Condition 1: Firm Surface, EO 30 Sec, Normal Sway  Condition 2: Firm Surface, EC 30 Sec, Mild Sway, pt shifting weight posteriorly on heels  Condition 3: Foam Surface, EO 30 Sec, Mild Sway  Condition 4: Foam Surface, EC 3 Sec, pt losing balance to the R.      TODAY'S TREATMENT:                                                                                                                               DATE: 06/10/22  *Patient was screened prior to therapeutic exercises, sacral and hip tests negative* (Patient response impacted by limited task orientation)   *Thigh thrust test = positive*    Manual Therapy:  - (B) knee to chest 5x10 seconds  - (B) hamstring stretch 2x30 seconds  - (B) hip adductor stretch 2x30 seconds  - Alternating hip IR and ER (B) 10x  - Hook lying knees side to side 10x   There Ex: - 8 minutes on the Nu Step (Manual heat pack on back)  -Ambulate from PT gym to valet station, cue for body mechanics and safety x 3 LOB requiring mod/max to remain upright. Education on safe elevator mobility to reduce fall risk. >900 ft   PATIENT EDUCATION: Education details: Clinical findings, POC. When discussing POC; pt reports that he lives in [place] and would prefer to get PT at [facility] as it is closer to his house. PT discussed with front desk to try to get pt scheduled over there. Educated that due to ankle DF weakness and foot drop, pt may benefit from AFOs/foot up braces to  help with foot drop during gait for improved gait mechanics. Will have to trial at future sessions. Discussed no getting on ladders and that pt would benefit from an AD (will trial different ones) to help prevent future falls.  Person educated: Patient Education method: Explanation Education comprehension: verbalized understanding  HOME EXERCISE PROGRAM:  Access Code: [email or web address] URL: [email or web address] Date: 06/03/2022 Prepared by: Janna Arch Exercises - Seated Long Arc Quad  - 1 x daily - 5 x weekly - 3 sets - 10 reps - 5 hold - Seated Toe Raise  - 1 x daily - 5 x weekly - 3 sets - 10 reps - Sit to Stand Without Arm Support  - 1 x daily - 5 x weekly - 2 sets - 10 reps - Romberg Stance with Eyes Closed  - 1 x daily - 5 x weekly - 1 sets - 3 reps - 30 hold - Tandem Stance  - 1 x daily - 5 x weekly - 1 sets - 3 reps - 30 hold -  Standing Single Leg Stance with Counter Support  - 1 x daily - 5 x weekly - 1 sets - 3 reps - 30 hold   GOALS: Goals reviewed with patient? Yes  SHORT TERM GOALS: Target date: 06/26/2022  Pt will be independent with initial HEP in order to build upon functional gains made in therapy. Baseline: Goal status:   2.  Patient will increase Dynamic Gait Index to >19/24 as to demonstrate reduced falls risk and improved dynamic gait balance for better safety with community/home ambulation.  Baseline: 06/03/22: 12/24 Goal status: INITIAL  3.  Pt will improve 5x sit<>stand to less than or equal to 14 sec to demonstrate improved functional strength and transfer efficiency.  Baseline: 15.63 seconds with no UE support Goal status: INITIAL  4.  Pt will ambulate at least 300' over level indoor/outdoor paved surfaces with supervision and LRAD in order to demo improved safety with mobility.  Baseline: No AD over level surfaces, supervision/CGA and unsteadiness.  Goal status: INITIAL  LONG TERM GOALS: Target date: 07/24/2022  Pt will be independent with final HEP in order to build upon functional gains made in therapy. Baseline:  Goal status: INITIAL  2.  Patient will increase dynamic gait index score to >19/24 as to demonstrate reduced fall risk and improved dynamic gait balance for better safety with community/home ambulation.  Baseline: 06/03/22: 12/24 Goal status: INITIAL  3.  Pt will improve condition 4 of mCTSIB to at least 12 seconds in order to demo improved vestibular input for balance.  Baseline: 3 seconds, pt losing balance to R.  Goal status: INITIAL  4.  Pt will improve gait speed with LRAD or appropriate braces/no AD to at least 3.3 ft/sec in order to demo improved community mobility.   Baseline: 11.16 seconds = 2.93 ft/sec with no AD  Goal status: INITIAL  5.  Pt will ambulate at least 500' outdoors over paved/grass surfaces with LRAD and supervision in order to demo improved  community mobility.  Baseline:  Goal status: INITIAL   ASSESSMENT:  CLINICAL IMPRESSION:  The patient presented to skilled therapy aggravated and in pain throughout their LLE, low back, sacrum, and ribs.The patient was screen for potential red flags, all tests performed came back negative expect the thigh thrust test on the (L) side. Skilled therapy was limited today due to the patients presentation and increased aggravation. Therapy was aimed at pain reduction and limiting further aggravation. The patient will be screened  for vestibular complications next session. Pt will continue to benefit from skilled therapy to address remaining deficits in order to improve overall QoL and return to PLOF.    OBJECTIVE IMPAIRMENTS: Abnormal gait, decreased activity tolerance, decreased balance, decreased knowledge of use of DME, difficulty walking, decreased ROM, decreased strength, and impaired sensation.   ACTIVITY LIMITATIONS: bending, stairs, transfers, and locomotion level  PARTICIPATION LIMITATIONS: community activity and yard work  PERSONAL FACTORS: Past/current experiences, Time since onset of injury/illness/exacerbation, 3+ comorbidities: Arthritis, hx of pancreas cancer, depression, hx of subdural hematoma , hx of hip fracture ~13 yrs ago , and frequent falls (>10 in the past 6 months)  are also affecting patient's functional outcome.   REHAB POTENTIAL: Good  CLINICAL DECISION MAKING: Evolving/moderate complexity  EVALUATION COMPLEXITY: Moderate  PLAN:  PT FREQUENCY: 2x/week  PT DURATION: 8 weeks  PLANNED INTERVENTIONS: Therapeutic exercises, Therapeutic activity, Neuromuscular re-education, Balance training, Gait training, Patient/Family education, Self Care, Stair training, Vestibular training, Orthotic/Fit training, DME instructions, and Re-evaluation  PLAN FOR NEXT SESSION: Vestibular screening   Sudie Bailey SPT   This entire session was performed under direct supervision and  direction of a licensed therapist/therapist assistant . I have personally read, edited and approve of the note as written.  Janna Arch, PT, DPT  06/10/22, 2:03 PM

## 2022-06-11 ENCOUNTER — Encounter: Payer: Self-pay | Admitting: Neurology

## 2022-06-11 NOTE — Progress Notes (Signed)
MRI lumbar spine shows moderate-severe arthritis and disc bulging which may be causing a pinched nerve at L4 and L5. This can cause low back/leg pain and difficulty with walking. I recommend he continue with physical therapy for now to see if this helps improve his gait

## 2022-06-11 NOTE — Progress Notes (Signed)
Brain MRI shows thickening of the meninges (covering of the brain), which looks similar to his previous MRI in 2021. This is likely chronic as it was seen on his MRI 2 years ago. I'll forward to Dr. Leta Baptist to review when he returns

## 2022-06-16 ENCOUNTER — Ambulatory Visit: Payer: BC Managed Care – PPO

## 2022-06-16 DIAGNOSIS — R2681 Unsteadiness on feet: Secondary | ICD-10-CM | POA: Diagnosis not present

## 2022-06-16 DIAGNOSIS — R262 Difficulty in walking, not elsewhere classified: Secondary | ICD-10-CM

## 2022-06-16 DIAGNOSIS — R296 Repeated falls: Secondary | ICD-10-CM

## 2022-06-16 DIAGNOSIS — M6281 Muscle weakness (generalized): Secondary | ICD-10-CM

## 2022-06-16 DIAGNOSIS — R2689 Other abnormalities of gait and mobility: Secondary | ICD-10-CM

## 2022-06-16 NOTE — Therapy (Signed)
OUTPATIENT PHYSICAL THERAPY NEURO TREATMENT  Patient Name: Jared [name] MRN: [id number] DOB:04/20/1956, 66 y.o., male Today's Date: 06/16/2022  PCP:  [name], MD  REFERRING PROVIDER: [name], MD  PT End of Session - 06/16/22 1614     Visit Number 5    Number of Visits 17    Date for PT Re-Evaluation 07/28/22    Authorization Type [employer]    PT Start Time 1606    PT Stop Time 1700    PT Time Calculation (min) 54 min    Equipment Utilized During Treatment Gait belt    Activity Tolerance Patient limited by pain    Behavior During Therapy Impulsive             Past Medical History:  Diagnosis Date   Arthritis    Cancer ([place])    pancreas   Complication of anesthesia    tends to come out of anesthesia during procedure   Depression    Dysphagia    Elevated lipids    GERD (gastroesophageal reflux disease)    H/O blood clots    superficial blood clot in left leg below knee tx w/ eliquis x 3 mo   History of kidney stones    Kidney disease    Neuroendocrine tumor    Subdural hematoma ([facility])    hx of   Tobacco abuse    Unsteadiness    Past Surgical History:  Procedure Laterality Date   ABDOMINAL SURGERY     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   CHOLECYSTECTOMY     added umbilical mesh   COLONOSCOPY WITH PROPOFOL N/A 03/27/2017   Procedure: COLONOSCOPY WITH PROPOFOL;  Surgeon: Manya Silvas, MD;  Location: [facility] ENDOSCOPY;  Service: Endoscopy;  Laterality: N/A;   ESOPHAGOGASTRODUODENOSCOPY (EGD) WITH PROPOFOL N/A 03/27/2017   Procedure: ESOPHAGOGASTRODUODENOSCOPY (EGD) WITH PROPOFOL;  Surgeon: Manya Silvas, MD;  Location: [facility] ENDOSCOPY;  Service: Endoscopy;  Laterality: N/A;   hip fracture with rod placement Right    MASS EXCISION Left 05/24/2021   Procedure: EXCISION MASS;  Surgeon: Benjamine Sprague, DO;  Location: [facility] ORS;  Service: General;  Laterality: Left;  Prone postition   Patient Active Problem List   Diagnosis Date Noted    AAA (abdominal aortic aneurysm) without rupture ([place]) 01/02/2022   RLS (restless legs syndrome) 09/19/2020   Varicose veins of both lower extremities 09/19/2020   TIA (transient ischemic attack) 01/01/2020   History of TIA (transient ischemic attack) 12/27/2019   PVC's (premature ventricular contractions) 12/27/2019   Pill dysphagia 03/17/2017   Hx of colonic polyps 12/05/2016   Irregular heart rate 12/05/2016   Hyperlipidemia 09/07/2015   Tobacco abuse 09/07/2015   Calculus, kidney 04/02/2015   Neuroendocrine tumor 06/22/2008   ONSET DATE: 05/19/2022  REFERRING DIAG: R26.9 (ICD-10-CM) - Gait difficulty R41.3 (ICD-10-CM) - Memory loss  THERAPY DIAG:  Difficulty in walking, not elsewhere classified  Muscle weakness (generalized)  Unsteadiness on feet  Repeated falls  Other abnormalities of gait and mobility  Rationale for Evaluation and Treatment: Rehabilitation   SUBJECTIVE:  SUBJECTIVE STATEMENT:  Pt reports that he is still experiencing a significant pain in their R adductor, noting 6/10 pain in that region.  Pt reports he went to get an X-Ray of the coccyx as recommended by last therapist and there was nothing found.    Pt accompanied by: self  PERTINENT HISTORY:  66 year old male here for evaluation of gait and balance difficulty and memory loss. Symptoms have been present since 2021. He has developed numbness in his feet, gait and balance difficulty. Has difficulty in the shower when he closes eyes. He feels like he is drunk when he walks. He tends to lose his balance. He is fallen 10 times last few months.  PMH: Arthritis, hx of pancreas cancer, depression, hx of subdural hematoma , hx of hip fracture ~13 yrs ago   PAIN:  Are you having pain? Yes: NPRS scale: 6-7/10 Pain location:  coccyx and L sided rib Pain description: Soreness where he fell   PRECAUTIONS: Fall  WEIGHT BEARING RESTRICTIONS: No  FALLS: Has patient fallen in last 6 months? Yes. Number of falls >10 falls   LIVING ENVIRONMENT: Lives with: lives with their spouse and and wife's mother Lives in: House/apartment Stairs: No Has following equipment at home: Single point cane and Grab bars - has suction cup grab bars   PLOF: Independent  PATIENT GOALS: Wants to improve balance.   OBJECTIVE:   DIAGNOSTIC FINDINGS:  03/27/20 MRI BRAIN 1. Positive for diffuse bilateral subdural hematoma versus dural thickening, 2-3 mm in thickness throughout both the entire posterior fossa and bilateral supratentorial brain. Follow-up noncontrast Head CT might be able to confirm hyperdense blood products, although small SDH this size will frequently be occult by CT. Follow-up Post-contrast brain MRI would be valuable although a degree of dural thickening is typically seen with SDH. Otherwise CSF analysis might also be valuable. 2. No significant intracranial mass effect or other acute intracranial abnormality. 3. MRAs today are reported separately.  Brain and lumbar spine MRI scheduled for 06/09/22  IMPRESSION: This MRI of the lumbar spine without contrast shows the following: 1. At L4-L5, there is severe loss of disc height and other degenerative change causing moderately severe foraminal narrowing and moderate lateral recess stenosis.  There is potential for L4 nerve root compression.  There is no spinal stenosis. 2. At L5-S1, there are degenerative changes causing moderately severe foraminal narrowing and moderate lateral recess stenosis.  There is potential for L5 nerve root compression to either side.  There is no spinal stenosis. 3. Milder degenerative changes at the other lumbar levels do not lead to spinal stenosis or nerve root compression.   COGNITION: Overall cognitive status: Within functional limits  for tasks assessed, however pt reporting some memory issues.    SENSATION: Light touch: Reports numbness/tingling in feet and in thighs, with light touch pt more impaired distally in RLE  Proprioception: WFL at bilat ankles   COORDINATION: Heel to shin: WFL bilat   LOWER EXTREMITY MMT:    MMT Right Eval Left Eval  Hip flexion 4/5 4-/5  Hip extension    Hip abduction 5/5 5/5  Hip adduction 5/5 5/5  Hip internal rotation    Hip external rotation    Knee flexion 5/5 5/5  Knee extension 4/5 4+/5  Ankle dorsiflexion 3-/5 3-/5  Ankle plantarflexion    Ankle inversion    Ankle eversion    (Blank rows = not tested)  Strength testing performed in sitting.   TRANSFERS: Assistive device utilized: None  Sit to stand: SBA Stand to sit: SBA From standard chair can perform without UE support, but reports at home has difficulty getting up from lower surfaces like the couch.   GAIT: Gait pattern: decreased ankle dorsiflexion- Right, decreased ankle dorsiflexion- Left, Right steppage, Left steppage, narrow BOS, poor foot clearance- Right, and poor foot clearance- Left Distance walked: Clinic distances  Assistive device utilized: None Level of assistance: SBA and CGA Comments: Pt with bilat foot drop and unsteadiness with gait with some staggering requiring CGA. Pt with tendency to almost scissor at times and have a narrow BOS.   FUNCTIONAL TESTS:  5 times sit to stand: 15.63 seconds with no UE support  Timed up and go (TUG): 9 seconds, cog TUG: 14.85 seconds 10 meter walk test: 11.16 seconds = 2.93 ft/sec with no AD   Feet together EO on level ground: 30 seconds, on air ex: 30 seconds  M-CTSIB (with feet hip width distance)  Condition 1: Firm Surface, EO 30 Sec, Normal Sway  Condition 2: Firm Surface, EC 30 Sec, Mild Sway, pt shifting weight posteriorly on heels  Condition 3: Foam Surface, EO 30 Sec, Mild Sway  Condition 4: Foam Surface, EC 3 Sec, pt losing balance to the R.       TODAY'S TREATMENT: DATE: 06/16/22   There Ex:  Seated marches, 4# AW, x10 each LE Seated LAQ, 4# AW, x10 each LE with pt reporting pain in the R LE when performing  Ambulation around the gym, x2 laps with 4# AW donned for increased resistance Standing hip abduction, 2x10 each LE with 4# AW donned Standing hip extension, 2x10 each LE with 4# AW donned Standing hamstring curls, 2x10 each LE with 4# AW donned Standing marches, 2x10 each LE with 4# AW donned  Pt assisted back to the car via wheelchair as pt unsure if he would be able to make it back to his car walking.   PATIENT EDUCATION:  Education details: Clinical findings, POC. When discussing POC; pt reports that he lives in [place] and would prefer to get PT at [facility] as it is closer to his house. PT discussed with front desk to try to get pt scheduled over there. Educated that due to ankle DF weakness and foot drop, pt may benefit from AFOs/foot up braces to help with foot drop during gait for improved gait mechanics. Will have to trial at future sessions. Discussed no getting on ladders and that pt would benefit from an AD (will trial different ones) to help prevent future falls.   Person educated: Patient Education method: Explanation Education comprehension: verbalized understanding  HOME EXERCISE PROGRAM:  Access Code: [email or web address] URL: [email or web address] Date: 06/03/2022 Prepared by: Janna Arch Exercises - Seated Long Arc Quad  - 1 x daily - 5 x weekly - 3 sets - 10 reps - 5 hold - Seated Toe Raise  - 1 x daily - 5 x weekly - 3 sets - 10 reps - Sit to Stand Without Arm Support  - 1 x daily - 5 x weekly - 2 sets - 10 reps - Romberg Stance with Eyes Closed  - 1 x daily - 5 x weekly - 1 sets - 3 reps - 30 hold - Tandem Stance  - 1 x daily - 5 x weekly - 1 sets - 3 reps - 30 hold - Standing Single Leg Stance with Counter Support  - 1 x daily - 5 x weekly - 1 sets - 3 reps - 30  hold   GOALS: Goals  reviewed with patient? Yes  SHORT TERM GOALS: Target date: 06/26/2022  Pt will be independent with initial HEP in order to build upon functional gains made in therapy. Baseline: Goal status:   2.  Patient will increase Dynamic Gait Index to >19/24 as to demonstrate reduced falls risk and improved dynamic gait balance for better safety with community/home ambulation.  Baseline: 06/03/22: 12/24 Goal status: INITIAL  3.  Pt will improve 5x sit<>stand to less than or equal to 14 sec to demonstrate improved functional strength and transfer efficiency.  Baseline: 15.63 seconds with no UE support Goal status: INITIAL  4.  Pt will ambulate at least 300' over level indoor/outdoor paved surfaces with supervision and LRAD in order to demo improved safety with mobility.  Baseline: No AD over level surfaces, supervision/CGA and unsteadiness.  Goal status: INITIAL  LONG TERM GOALS: Target date: 07/24/2022  Pt will be independent with final HEP in order to build upon functional gains made in therapy. Baseline:  Goal status: INITIAL  2.  Patient will increase dynamic gait index score to >19/24 as to demonstrate reduced fall risk and improved dynamic gait balance for better safety with community/home ambulation.  Baseline: 06/03/22: 12/24 Goal status: INITIAL  3.  Pt will improve condition 4 of mCTSIB to at least 12 seconds in order to demo improved vestibular input for balance.  Baseline: 3 seconds, pt losing balance to R.  Goal status: INITIAL  4.  Pt will improve gait speed with LRAD or appropriate braces/no AD to at least 3.3 ft/sec in order to demo improved community mobility.   Baseline: 11.16 seconds = 2.93 ft/sec with no AD  Goal status: INITIAL  5.  Pt will ambulate at least 500' outdoors over paved/grass surfaces with LRAD and supervision in order to demo improved community mobility.  Baseline:  Goal status: INITIAL   ASSESSMENT:  CLINICAL IMPRESSION:  Pt in a significant amount  of pain and was limited by what he was able to do, due to the pain he was experiencing in his R hip adductors.  Pt still performed seated and standing exercises in order to address global weakness in the LE's that are contributing to his imbalance issues.  He will however, benefit from screening for vestibular pathology that may be contributing to his imbalance as well.  OBJECTIVE IMPAIRMENTS: Abnormal gait, decreased activity tolerance, decreased balance, decreased knowledge of use of DME, difficulty walking, decreased ROM, decreased strength, and impaired sensation.   ACTIVITY LIMITATIONS: bending, stairs, transfers, and locomotion level  PARTICIPATION LIMITATIONS: community activity and yard work  PERSONAL FACTORS: Past/current experiences, Time since onset of injury/illness/exacerbation, 3+ comorbidities: Arthritis, hx of pancreas cancer, depression, hx of subdural hematoma , hx of hip fracture ~13 yrs ago , and frequent falls (>10 in the past 6 months)  are also affecting patient's functional outcome.   REHAB POTENTIAL: Good  CLINICAL DECISION MAKING: Evolving/moderate complexity  EVALUATION COMPLEXITY: Moderate  PLAN:  PT FREQUENCY: 2x/week  PT DURATION: 8 weeks  PLANNED INTERVENTIONS: Therapeutic exercises, Therapeutic activity, Neuromuscular re-education, Balance training, Gait training, Patient/Family education, Self Care, Stair training, Vestibular training, Orthotic/Fit training, DME instructions, and Re-evaluation  PLAN FOR NEXT SESSION: Vestibular screening    Gwenlyn Saran, PT, DPT Physical Therapist- [facility]  06/16/22, 5:11 PM

## 2022-06-18 ENCOUNTER — Ambulatory Visit: Payer: BC Managed Care – PPO

## 2022-06-18 DIAGNOSIS — M6281 Muscle weakness (generalized): Secondary | ICD-10-CM

## 2022-06-18 DIAGNOSIS — R2681 Unsteadiness on feet: Secondary | ICD-10-CM | POA: Diagnosis not present

## 2022-06-18 DIAGNOSIS — R296 Repeated falls: Secondary | ICD-10-CM

## 2022-06-18 DIAGNOSIS — R262 Difficulty in walking, not elsewhere classified: Secondary | ICD-10-CM

## 2022-06-18 NOTE — Therapy (Signed)
OUTPATIENT PHYSICAL THERAPY NEURO TREATMENT  Patient Name: Jared [name] MRN: [id number] DOB:11-Mar-1956, 66 y.o., male Today's Date: 06/18/2022  PCP:  [name], MD  REFERRING PROVIDER: [name], MD  PT End of Session - 06/18/22 1644     Visit Number 6    Number of Visits 17    Date for PT Re-Evaluation 07/28/22    Authorization Type BC[employer]    PT Start Time 1645    PT Stop Time 1730    PT Time Calculation (min) 45 min    Equipment Utilized During Treatment Gait belt    Activity Tolerance Patient limited by pain    Behavior During Therapy Impulsive            Past Medical History:  Diagnosis Date   Arthritis    Cancer ([place])    pancreas   Complication of anesthesia    tends to come out of anesthesia during procedure   Depression    Dysphagia    Elevated lipids    GERD (gastroesophageal reflux disease)    H/O blood clots    superficial blood clot in left leg below knee tx w/ eliquis x 3 mo   History of kidney stones    Kidney disease    Neuroendocrine tumor    Subdural hematoma ([facility])    hx of   Tobacco abuse    Unsteadiness    Past Surgical History:  Procedure Laterality Date   ABDOMINAL SURGERY     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   CHOLECYSTECTOMY     added umbilical mesh   COLONOSCOPY WITH PROPOFOL N/A 03/27/2017   Procedure: COLONOSCOPY WITH PROPOFOL;  Surgeon: Manya Silvas, MD;  Location: [facility] ENDOSCOPY;  Service: Endoscopy;  Laterality: N/A;   ESOPHAGOGASTRODUODENOSCOPY (EGD) WITH PROPOFOL N/A 03/27/2017   Procedure: ESOPHAGOGASTRODUODENOSCOPY (EGD) WITH PROPOFOL;  Surgeon: Manya Silvas, MD;  Location: [facility] ENDOSCOPY;  Service: Endoscopy;  Laterality: N/A;   hip fracture with rod placement Right    MASS EXCISION Left 05/24/2021   Procedure: EXCISION MASS;  Surgeon: Benjamine Sprague, DO;  Location: [facility] ORS;  Service: General;  Laterality: Left;  Prone postition   Patient Active Problem List   Diagnosis Date Noted    AAA (abdominal aortic aneurysm) without rupture ([place]) 01/02/2022   RLS (restless legs syndrome) 09/19/2020   Varicose veins of both lower extremities 09/19/2020   TIA (transient ischemic attack) 01/01/2020   History of TIA (transient ischemic attack) 12/27/2019   PVC's (premature ventricular contractions) 12/27/2019   Pill dysphagia 03/17/2017   Hx of colonic polyps 12/05/2016   Irregular heart rate 12/05/2016   Hyperlipidemia 09/07/2015   Tobacco abuse 09/07/2015   Calculus, kidney 04/02/2015   Neuroendocrine tumor 06/22/2008   ONSET DATE: 05/19/2022  REFERRING DIAG: R26.9 (ICD-10-CM) - Gait difficulty R41.3 (ICD-10-CM) - Memory loss  THERAPY DIAG:  Difficulty in walking, not elsewhere classified  Muscle weakness (generalized)  Unsteadiness on feet  Repeated falls  Rationale for Evaluation and Treatment: Rehabilitation  SUBJECTIVE:  SUBJECTIVE STATEMENT:  Patient reports they are feeling much better today then that have since their last fall. The patient reports pain today while walking at a 5/10 on the NPRS today. Pt reports they have consult with the doctor next week to talk more about the X-Ray of the coccyx they got this week. Patient also reports some burning pain throughout their left thigh and reports they have not been able to sleep on the (L) side at night.   Pt accompanied by: self  PERTINENT HISTORY:  66 year old male here for evaluation of gait and balance difficulty and memory loss. Symptoms have been present since 2021. He has developed numbness in his feet, gait and balance difficulty. Has difficulty in the shower when he closes eyes. He feels like he is drunk when he walks. He tends to lose his balance. He is fallen 10 times last few months.  PMH: Arthritis, hx of pancreas  cancer, depression, hx of subdural hematoma , hx of hip fracture ~13 yrs ago   PAIN:  Are you having pain? Yes: NPRS scale: 6-7/10 Pain location: coccyx and L sided rib Pain description: Soreness where he fell   PRECAUTIONS: Fall  WEIGHT BEARING RESTRICTIONS: No  FALLS: Has patient fallen in last 6 months? Yes. Number of falls >10 falls   LIVING ENVIRONMENT: Lives with: lives with their spouse and and wife's mother Lives in: House/apartment Stairs: No Has following equipment at home: Single point cane and Grab bars - has suction cup grab bars   PLOF: Independent  PATIENT GOALS: Wants to improve balance.   OBJECTIVE:   DIAGNOSTIC FINDINGS:  03/27/20 MRI BRAIN 1. Positive for diffuse bilateral subdural hematoma versus dural thickening, 2-3 mm in thickness throughout both the entire posterior fossa and bilateral supratentorial brain. Follow-up noncontrast Head CT might be able to confirm hyperdense blood products, although small SDH this size will frequently be occult by CT. Follow-up Post-contrast brain MRI would be valuable although a degree of dural thickening is typically seen with SDH. Otherwise CSF analysis might also be valuable. 2. No significant intracranial mass effect or other acute intracranial abnormality. 3. MRAs today are reported separately.  Brain and lumbar spine MRI scheduled for 06/09/22  IMPRESSION: This MRI of the lumbar spine without contrast shows the following: 1. At L4-L5, there is severe loss of disc height and other degenerative change causing moderately severe foraminal narrowing and moderate lateral recess stenosis.  There is potential for L4 nerve root compression.  There is no spinal stenosis. 2. At L5-S1, there are degenerative changes causing moderately severe foraminal narrowing and moderate lateral recess stenosis.  There is potential for L5 nerve root compression to either side.  There is no spinal stenosis. 3. Milder degenerative changes  at the other lumbar levels do not lead to spinal stenosis or nerve root compression.   COGNITION: Overall cognitive status: Within functional limits for tasks assessed, however pt reporting some memory issues.    SENSATION: Light touch: Reports numbness/tingling in feet and in thighs, with light touch pt more impaired distally in RLE  Proprioception: WFL at bilat ankles   COORDINATION: Heel to shin: WFL bilat   LOWER EXTREMITY MMT:    MMT Right Eval Left Eval  Hip flexion 4/5 4-/5  Hip extension    Hip abduction 5/5 5/5  Hip adduction 5/5 5/5  Hip internal rotation    Hip external rotation    Knee flexion 5/5 5/5  Knee extension 4/5 4+/5  Ankle dorsiflexion 3-/5 3-/5  Ankle plantarflexion    Ankle inversion    Ankle eversion    (Blank rows = not tested)  Strength testing performed in sitting.   TRANSFERS: Assistive device utilized: None  Sit to stand: SBA Stand to sit: SBA From standard chair can perform without UE support, but reports at home has difficulty getting up from lower surfaces like the couch.   GAIT: Gait pattern: decreased ankle dorsiflexion- Right, decreased ankle dorsiflexion- Left, Right steppage, Left steppage, narrow BOS, poor foot clearance- Right, and poor foot clearance- Left Distance walked: Clinic distances  Assistive device utilized: None Level of assistance: SBA and CGA Comments: Pt with bilat foot drop and unsteadiness with gait with some staggering requiring CGA. Pt with tendency to almost scissor at times and have a narrow BOS.   FUNCTIONAL TESTS:  5 times sit to stand: 15.63 seconds with no UE support  Timed up and go (TUG): 9 seconds, cog TUG: 14.85 seconds 10 meter walk test: 11.16 seconds = 2.93 ft/sec with no AD   Feet together EO on level ground: 30 seconds, on air ex: 30 seconds  M-CTSIB (with feet hip width distance)  Condition 1: Firm Surface, EO 30 Sec, Normal Sway  Condition 2: Firm Surface, EC 30 Sec, Mild Sway, pt  shifting weight posteriorly on heels  Condition 3: Foam Surface, EO 30 Sec, Mild Sway  Condition 4: Foam Surface, EC 3 Sec, pt losing balance to the R.      TODAY'S TREATMENT: DATE: 06/18/22    Neuro Re-Ed:  Airex Pad:   (No UE support, CGA)  - Regular stance 30 seconds  - Regular stance with EC 30 seconds   (BUE support, CGA)  - 3 color Hedgehog taps (Alternating legs, moving from side to side) 30 sec  - 3 color Hedgehog taps (Alternating legs, touching color PT called out) 30 sec  - 3 color Hedgehog taps (Alternating legs, touching color patterns PT called out) 30 sec (*Patient experienced some minor pain in the Left adductor muscles)  (SUE with R hand, CGA)  - 3-way, Single Leg Hedgehog taps 10x  (*Patient couldn't tolerate standing on the left leg during this exercises)  White board activities on Airex Pad:  - Hang Man (Pt was asked to guess letters, find them, and put them in the correct spot) 1x  Standing Speed Bag Boxing:  - Standing boxing 30 seconds  - Standing on Airex Pad boxing 30 seconds   Seated on dynadisc core progressions: - Alternating arm raises (10x ea side) - Seated lateral weight shifts (10x ea side) - Seated forward/backward weight shifts (10x ea side)  Seated with foot on dynadisc  - Alternating Ankle rolls forward and back 10x ea - Alternating Ankle lateral ankle rolls 10x ea - Ankle circles 10x ea   Seated:  - Alternating ankle DF with YTB around both feet 10x ea - Ankle alphabet with leg elevated off ground 1x (LLE only)  Ambulate upstairs to entrance of hospital with [facility] and CGA x560 ft   PATIENT EDUCATION:  Education details: Clinical findings, POC. When discussing POC; pt reports that he lives in [place] and would prefer to get PT at [facility] as it is closer to his house. PT discussed with front desk to try to get pt scheduled over there. Educated that due to ankle DF weakness and foot drop, pt may benefit from AFOs/foot  up braces to help with foot drop during gait for improved gait mechanics. Will have to trial  at future sessions. Discussed no getting on ladders and that pt would benefit from an AD (will trial different ones) to help prevent future falls.   Person educated: Patient Education method: Explanation Education comprehension: verbalized understanding  HOME EXERCISE PROGRAM:  Access Code: [email or web address] URL: [email or web address] Date: 06/03/2022 Prepared by: Janna Arch Exercises - Seated Long Arc Quad  - 1 x daily - 5 x weekly - 3 sets - 10 reps - 5 hold - Seated Toe Raise  - 1 x daily - 5 x weekly - 3 sets - 10 reps - Sit to Stand Without Arm Support  - 1 x daily - 5 x weekly - 2 sets - 10 reps - Romberg Stance with Eyes Closed  - 1 x daily - 5 x weekly - 1 sets - 3 reps - 30 hold - Tandem Stance  - 1 x daily - 5 x weekly - 1 sets - 3 reps - 30 hold - Standing Single Leg Stance with Counter Support  - 1 x daily - 5 x weekly - 1 sets - 3 reps - 30 hold   GOALS: Goals reviewed with patient? Yes  SHORT TERM GOALS: Target date: 06/26/2022  Pt will be independent with initial HEP in order to build upon functional gains made in therapy. Baseline: Goal status:   2.  Patient will increase Dynamic Gait Index to >19/24 as to demonstrate reduced falls risk and improved dynamic gait balance for better safety with community/home ambulation.  Baseline: 06/03/22: 12/24 Goal status: INITIAL  3.  Pt will improve 5x sit<>stand to less than or equal to 14 sec to demonstrate improved functional strength and transfer efficiency.  Baseline: 15.63 seconds with no UE support Goal status: INITIAL  4.  Pt will ambulate at least 300' over level indoor/outdoor paved surfaces with supervision and LRAD in order to demo improved safety with mobility.  Baseline: No AD over level surfaces, supervision/CGA and unsteadiness.  Goal status: INITIAL  LONG TERM GOALS: Target date: 07/24/2022  Pt will be independent with  final HEP in order to build upon functional gains made in therapy. Baseline:  Goal status: INITIAL  2.  Patient will increase dynamic gait index score to >19/24 as to demonstrate reduced fall risk and improved dynamic gait balance for better safety with community/home ambulation.  Baseline: 06/03/22: 12/24 Goal status: INITIAL  3.  Pt will improve condition 4 of mCTSIB to at least 12 seconds in order to demo improved vestibular input for balance.  Baseline: 3 seconds, pt losing balance to R.  Goal status: INITIAL  4.  Pt will improve gait speed with LRAD or appropriate braces/no AD to at least 3.3 ft/sec in order to demo improved community mobility.   Baseline: 11.16 seconds = 2.93 ft/sec with no AD  Goal status: INITIAL  5.  Pt will ambulate at least 500' outdoors over paved/grass surfaces with LRAD and supervision in order to demo improved community mobility.  Baseline:  Goal status: INITIAL   ASSESSMENT:  CLINICAL IMPRESSION: Patient presented to skilled therapy today in less pain overall then he presented to therapy with last session. The patient performed well with airex pad balance progressions and was only limited by pain in their LLE when performing single leg balance activities on the LLE. The patient couldn't perform balance activities single leg activities on the LLE but was able to perform balance activities standing on the RLE. The focus of today's treatment was to work on balance and coordination without eliciting further  aggravation of the coccyx and right adductor muscles. The patient required constant CGA when from PT when setting up for balance activities and when ambulating around the gym. With seated core progressions with a dynadisc the patient required added stability to both knees and knee blocking PT to keep patient from losing balance in chair. Overall, the patient was able to tolerate increased activity today and perform standing balance exercises for the first time  since starting skilled therapy. The patient will still benefit from screening for vestibular pathology that may be contributing to his imbalance during his next visit.  OBJECTIVE IMPAIRMENTS: Abnormal gait, decreased activity tolerance, decreased balance, decreased knowledge of use of DME, difficulty walking, decreased ROM, decreased strength, and impaired sensation.   ACTIVITY LIMITATIONS: bending, stairs, transfers, and locomotion level  PARTICIPATION LIMITATIONS: community activity and yard work  PERSONAL FACTORS: Past/current experiences, Time since onset of injury/illness/exacerbation, 3+ comorbidities: Arthritis, hx of pancreas cancer, depression, hx of subdural hematoma , hx of hip fracture ~13 yrs ago , and frequent falls (>10 in the past 6 months)  are also affecting patient's functional outcome.   REHAB POTENTIAL: Good  CLINICAL DECISION MAKING: Evolving/moderate complexity  EVALUATION COMPLEXITY: Moderate  PLAN:  PT FREQUENCY: 2x/week  PT DURATION: 8 weeks  PLANNED INTERVENTIONS: Therapeutic exercises, Therapeutic activity, Neuromuscular re-education, Balance training, Gait training, Patient/Family education, Self Care, Stair training, Vestibular training, Orthotic/Fit training, DME instructions, and Re-evaluation  PLAN FOR NEXT SESSION: Vestibular screening   Sudie Bailey SPT   This entire session was performed under direct supervision and direction of a licensed therapist/therapist assistant . I have personally read, edited and approve of the note as written.  Janna Arch, PT, DPT  [facility]  06/18/22, 5:28 PM

## 2022-06-23 ENCOUNTER — Ambulatory Visit: Payer: BC Managed Care – PPO | Attending: Diagnostic Neuroimaging

## 2022-06-23 DIAGNOSIS — R42 Dizziness and giddiness: Secondary | ICD-10-CM | POA: Diagnosis present

## 2022-06-23 DIAGNOSIS — M5459 Other low back pain: Secondary | ICD-10-CM | POA: Diagnosis present

## 2022-06-23 DIAGNOSIS — R262 Difficulty in walking, not elsewhere classified: Secondary | ICD-10-CM | POA: Diagnosis present

## 2022-06-23 DIAGNOSIS — R296 Repeated falls: Secondary | ICD-10-CM | POA: Insufficient documentation

## 2022-06-23 DIAGNOSIS — R2681 Unsteadiness on feet: Secondary | ICD-10-CM | POA: Diagnosis present

## 2022-06-23 DIAGNOSIS — M6281 Muscle weakness (generalized): Secondary | ICD-10-CM | POA: Insufficient documentation

## 2022-06-23 DIAGNOSIS — R2689 Other abnormalities of gait and mobility: Secondary | ICD-10-CM | POA: Diagnosis not present

## 2022-06-23 NOTE — Therapy (Signed)
OUTPATIENT PHYSICAL THERAPY NEURO TREATMENT  Patient Name: Jared [name] MRN: [id number] DOB:[birth year], 66 y.o., male Today's Date: 06/23/2022  PCP:  [name], MD  REFERRING PROVIDER: [name], MD  PT End of Session - 06/23/22 1504     Visit Number 7    Number of Visits 17    Date for PT Re-Evaluation 07/28/22    Authorization Type [employer]    PT Start Time 1104    PT Stop Time 1145    PT Time Calculation (min) 41 min    Equipment Utilized During Treatment Gait belt    Activity Tolerance Patient tolerated treatment well;Other (comment)   pt required prolonged rest break (monitored by SPT and another PT) following session due to increased dizziness symptoms, improved with rest   Behavior During Therapy Impulsive;WFL for tasks assessed/performed            Past Medical History:  Diagnosis Date   Arthritis    Cancer ([place])    pancreas   Complication of anesthesia    tends to come out of anesthesia during procedure   Depression    Dysphagia    Elevated lipids    GERD (gastroesophageal reflux disease)    H/O blood clots    superficial blood clot in left leg below knee tx w/ eliquis x 3 mo   History of kidney stones    Kidney disease    Neuroendocrine tumor    Subdural hematoma ([facility])    hx of   Tobacco abuse    Unsteadiness    Past Surgical History:  Procedure Laterality Date   ABDOMINAL SURGERY     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   CHOLECYSTECTOMY     added umbilical mesh   COLONOSCOPY WITH PROPOFOL N/A 03/27/2017   Procedure: COLONOSCOPY WITH PROPOFOL;  Surgeon: Manya Silvas, MD;  Location: [facility] ENDOSCOPY;  Service: Endoscopy;  Laterality: N/A;   ESOPHAGOGASTRODUODENOSCOPY (EGD) WITH PROPOFOL N/A 03/27/2017   Procedure: ESOPHAGOGASTRODUODENOSCOPY (EGD) WITH PROPOFOL;  Surgeon: Manya Silvas, MD;  Location: [facility] ENDOSCOPY;  Service: Endoscopy;  Laterality: N/A;   hip fracture with rod placement Right    MASS EXCISION Left  05/24/2021   Procedure: EXCISION MASS;  Surgeon: Benjamine Sprague, DO;  Location: [facility] ORS;  Service: General;  Laterality: Left;  Prone postition   Patient Active Problem List   Diagnosis Date Noted   AAA (abdominal aortic aneurysm) without rupture ([place]) 01/02/2022   RLS (restless legs syndrome) 09/19/2020   Varicose veins of both lower extremities 09/19/2020   TIA (transient ischemic attack) 01/01/2020   History of TIA (transient ischemic attack) 12/27/2019   PVC's (premature ventricular contractions) 12/27/2019   Pill dysphagia 03/17/2017   Hx of colonic polyps 12/05/2016   Irregular heart rate 12/05/2016   Hyperlipidemia 09/07/2015   Tobacco abuse 09/07/2015   Calculus, kidney 04/02/2015   Neuroendocrine tumor 06/22/2008   ONSET DATE: 05/19/2022  REFERRING DIAG: R26.9 (ICD-10-CM) - Gait difficulty R41.3 (ICD-10-CM) - Memory loss  THERAPY DIAG:  Dizziness and giddiness  Unsteadiness on feet  Rationale for Evaluation and Treatment: Rehabilitation  SUBJECTIVE:  SUBJECTIVE STATEMENT:  Pt reports he feels improvement with his pain. While standing, it reaches 2-3/10. Reports he has no significant neck pain/problems. Pt reports history of falls, most recent fall he doesn't remember. He reports his dizziness has been ongoing for six months. He reports the following movements bring on his symptoms: bending, rinsing his hair by tilting his head back, and with turning his head. He denies experiencing any spinning sensations, but reports these movements make him feel nauseated. He reports rolling over in his bed does not cause his symptoms. In addition to nausea, he describes feeling as though he could fall backwards. At times he feels lightheaded with standing up, but reports this is a different sensation. He  agrees that he could describe his dizziness as swimmy-headedness.   Pt accompanied by: self  PERTINENT HISTORY:  66 year old male here for evaluation of gait and balance difficulty and memory loss. Symptoms have been present since 2021. He has developed numbness in his feet, gait and balance difficulty. Has difficulty in the shower when he closes eyes. He feels like he is drunk when he walks. He tends to lose his balance. He is fallen 10 times last few months.  PMH: Arthritis, hx of pancreas cancer, depression, hx of subdural hematoma , hx of hip fracture ~13 yrs ago   PAIN:  Are you having pain? Yes: NPRS scale: 6-7/10 Pain location: coccyx and L sided rib Pain description: Soreness where he fell   PRECAUTIONS: Fall  WEIGHT BEARING RESTRICTIONS: No  FALLS: Has patient fallen in last 6 months? Yes. Number of falls >10 falls   LIVING ENVIRONMENT: Lives with: lives with their spouse and and wife's mother Lives in: House/apartment Stairs: No Has following equipment at home: Single point cane and Grab bars - has suction cup grab bars   PLOF: Independent  PATIENT GOALS: Wants to improve balance.   OBJECTIVE:   DIAGNOSTIC FINDINGS:  03/27/20 MRI BRAIN 1. Positive for diffuse bilateral subdural hematoma versus dural thickening, 2-3 mm in thickness throughout both the entire posterior fossa and bilateral supratentorial brain. Follow-up noncontrast Head CT might be able to confirm hyperdense blood products, although small SDH this size will frequently be occult by CT. Follow-up Post-contrast brain MRI would be valuable although a degree of dural thickening is typically seen with SDH. Otherwise CSF analysis might also be valuable. 2. No significant intracranial mass effect or other acute intracranial abnormality. 3. MRAs today are reported separately.  Brain and lumbar spine MRI scheduled for 06/09/22  IMPRESSION: This MRI of the lumbar spine without contrast shows the  following: 1. At L4-L5, there is severe loss of disc height and other degenerative change causing moderately severe foraminal narrowing and moderate lateral recess stenosis.  There is potential for L4 nerve root compression.  There is no spinal stenosis. 2. At L5-S1, there are degenerative changes causing moderately severe foraminal narrowing and moderate lateral recess stenosis.  There is potential for L5 nerve root compression to either side.  There is no spinal stenosis. 3. Milder degenerative changes at the other lumbar levels do not lead to spinal stenosis or nerve root compression.   COGNITION: Overall cognitive status: Within functional limits for tasks assessed, however pt reporting some memory issues.    SENSATION: Light touch: Reports numbness/tingling in feet and in thighs, with light touch pt more impaired distally in RLE  Proprioception: WFL at bilat ankles   COORDINATION: Heel to shin: WFL bilat   LOWER EXTREMITY MMT:    MMT Right  Eval Left Eval  Hip flexion 4/5 4-/5  Hip extension    Hip abduction 5/5 5/5  Hip adduction 5/5 5/5  Hip internal rotation    Hip external rotation    Knee flexion 5/5 5/5  Knee extension 4/5 4+/5  Ankle dorsiflexion 3-/5 3-/5  Ankle plantarflexion    Ankle inversion    Ankle eversion    (Blank rows = not tested)  Strength testing performed in sitting.   TRANSFERS: Assistive device utilized: None  Sit to stand: SBA Stand to sit: SBA From standard chair can perform without UE support, but reports at home has difficulty getting up from lower surfaces like the couch.   GAIT: Gait pattern: decreased ankle dorsiflexion- Right, decreased ankle dorsiflexion- Left, Right steppage, Left steppage, narrow BOS, poor foot clearance- Right, and poor foot clearance- Left Distance walked: Clinic distances  Assistive device utilized: None Level of assistance: SBA and CGA Comments: Pt with bilat foot drop and unsteadiness with gait with some  staggering requiring CGA. Pt with tendency to almost scissor at times and have a narrow BOS.   FUNCTIONAL TESTS:  5 times sit to stand: 15.63 seconds with no UE support  Timed up and go (TUG): 9 seconds, cog TUG: 14.85 seconds 10 meter walk test: 11.16 seconds = 2.93 ft/sec with no AD   Feet together EO on level ground: 30 seconds, on air ex: 30 seconds  M-CTSIB (with feet hip width distance)  Condition 1: Firm Surface, EO 30 Sec, Normal Sway  Condition 2: Firm Surface, EC 30 Sec, Mild Sway, pt shifting weight posteriorly on heels  Condition 3: Foam Surface, EO 30 Sec, Mild Sway  Condition 4: Foam Surface, EC 3 Sec, pt losing balance to the R.      TODAY'S TREATMENT: DATE: 06/23/22  Neuro Re-Ed:   AROM cervical screen: slight impairment observed in all planes, but only tightness reported with no pain, no other symptoms reported, no dizziness. VBI screen: negative   Oculomotor screen- Vergence - WNL Smooth pursuits - saccadic  Saccades - abnormal to R side, pt can feel the "jump" when attempting, normal to L side Positional testing - roll testing and dix-hallpike all negative. No dizziness or nystagmus with testing. Head impulse testing - limited due to neck stiffness/guarding, unable to achieve sufficient speed to test. No dizziness with testing  Instructed pt in seated VORx1 with horizontal head turns 2x30 sec (added to HEP), plain background. Pt required prolonged rest due to increased symptoms.  Provided education regarding symptom modulation with VOR HEP (use of 0-10 scale, not to go beyond 2/10, to include rest intervals and to decrease interval of rep if it makes pt too symptomatic). Pt verbalized understanding.   SPT and another PT sat with pt following session to monitor pt while dizziness/nausea decreased. Pt did report improvement in symptoms with rest.  Another PT ambulated with pt to car following session.    PATIENT EDUCATION:  Education details: HEP,  indications of testing  Person educated: Patient Education method: Explanation Education comprehension: verbalized understanding  HOME EXERCISE PROGRAM:  Access Code: [email or web address] URL: [email or web address] Date: 06/23/2022 Prepared by: Ricard Dillon  Exercises - Seated Gaze Stabilization with Head Rotation  - 1 x daily - 7 x weekly - 3 sets - 2 reps - 30 seconds hold  Access Code: [email or web address] URL: [email or web address] Date: 06/03/2022 Prepared by: Janna Arch Exercises - Seated Long Arc Quad  - 1 x daily - 5 x weekly - 3 sets - 10 reps -  5 hold - Seated Toe Raise  - 1 x daily - 5 x weekly - 3 sets - 10 reps - Sit to Stand Without Arm Support  - 1 x daily - 5 x weekly - 2 sets - 10 reps - Romberg Stance with Eyes Closed  - 1 x daily - 5 x weekly - 1 sets - 3 reps - 30 hold - Tandem Stance  - 1 x daily - 5 x weekly - 1 sets - 3 reps - 30 hold - Standing Single Leg Stance with Counter Support  - 1 x daily - 5 x weekly - 1 sets - 3 reps - 30 hold   GOALS: Goals reviewed with patient? Yes  SHORT TERM GOALS: Target date: 06/26/2022  Pt will be independent with initial HEP in order to build upon functional gains made in therapy. Baseline: Goal status:   2.  Patient will increase Dynamic Gait Index to >19/24 as to demonstrate reduced falls risk and improved dynamic gait balance for better safety with community/home ambulation.  Baseline: 06/03/22: 12/24 Goal status: INITIAL  3.  Pt will improve 5x sit<>stand to less than or equal to 14 sec to demonstrate improved functional strength and transfer efficiency.  Baseline: 15.63 seconds with no UE support Goal status: INITIAL  4.  Pt will ambulate at least 300' over level indoor/outdoor paved surfaces with supervision and LRAD in order to demo improved safety with mobility.  Baseline: No AD over level surfaces, supervision/CGA and unsteadiness.  Goal status: INITIAL  LONG TERM GOALS: Target date: 07/24/2022  Pt will be  independent with final HEP in order to build upon functional gains made in therapy. Baseline:  Goal status: INITIAL  2.  Patient will increase dynamic gait index score to >19/24 as to demonstrate reduced fall risk and improved dynamic gait balance for better safety with community/home ambulation.  Baseline: 06/03/22: 12/24 Goal status: INITIAL  3.  Pt will improve condition 4 of mCTSIB to at least 12 seconds in order to demo improved vestibular input for balance.  Baseline: 3 seconds, pt losing balance to R.  Goal status: INITIAL  4.  Pt will improve gait speed with LRAD or appropriate braces/no AD to at least 3.3 ft/sec in order to demo improved community mobility.   Baseline: 11.16 seconds = 2.93 ft/sec with no AD  Goal status: INITIAL  5.  Pt will ambulate at least 500' outdoors over paved/grass surfaces with LRAD and supervision in order to demo improved community mobility.  Baseline:  Goal status: INITIAL   ASSESSMENT:  CLINICAL IMPRESSION: Vestibular assessment performed. Pt with negative positional testing, but with abnormal oculomotor testing, suggestive that dizziness is central in origin. Added seated VORx1 with horizontal head turns for HEP. However, pt should be reassessed for symptom response next visit to confirm that VOR HEP doesn't need to be modified to reduce symptom response (30 seconds per rep may be too much for pt currently if his symptoms persist >20 minutes following performing HEP). The pt will benefit from further skilled PT to improve dizziness, balance, and mobility to decrease fall risk and increase QOL.  OBJECTIVE IMPAIRMENTS: Abnormal gait, decreased activity tolerance, decreased balance, decreased knowledge of use of DME, difficulty walking, decreased ROM, decreased strength, and impaired sensation.   ACTIVITY LIMITATIONS: bending, stairs, transfers, and locomotion level  PARTICIPATION LIMITATIONS: community activity and yard work  PERSONAL FACTORS:  Past/current experiences, Time since onset of injury/illness/exacerbation, 3+ comorbidities: Arthritis, hx of pancreas cancer, depression, hx of  subdural hematoma , hx of hip fracture ~13 yrs ago , and frequent falls (>10 in the past 6 months)  are also affecting patient's functional outcome.   REHAB POTENTIAL: Good  CLINICAL DECISION MAKING: Evolving/moderate complexity  EVALUATION COMPLEXITY: Moderate  PLAN:  PT FREQUENCY: 2x/week  PT DURATION: 8 weeks  PLANNED INTERVENTIONS: Therapeutic exercises, Therapeutic activity, Neuromuscular re-education, Balance training, Gait training, Patient/Family education, Self Care, Stair training, Vestibular training, Orthotic/Fit training, DME instructions, and Re-evaluation  PLAN FOR NEXT SESSION: VOR, balance, continue plan Ricard Dillon PT, DPT   [facility]  06/23/22, 4:20 PM

## 2022-06-24 NOTE — Therapy (Signed)
OUTPATIENT PHYSICAL THERAPY NEURO TREATMENT  Patient Name: Jared [name] MRN: [id number] DOB:10/30/1955, 66 y.o., male Today's Date: 06/25/2022  PCP:  [name], MD  REFERRING PROVIDER: [name], MD  PT End of Session - 06/25/22 1650     Visit Number 8    Number of Visits 17    Date for PT Re-Evaluation 07/28/22    Authorization Type [employer]    PT Start Time 8657    PT Stop Time 1729    PT Time Calculation (min) 39 min    Equipment Utilized During Treatment Gait belt    Activity Tolerance Patient tolerated treatment well;Other (comment)   pt required prolonged rest break (monitored by SPT and another PT) following session due to increased dizziness symptoms, improved with rest   Behavior During Therapy Impulsive;WFL for tasks assessed/performed             Past Medical History:  Diagnosis Date   Arthritis    Cancer ([place])    pancreas   Complication of anesthesia    tends to come out of anesthesia during procedure   Depression    Dysphagia    Elevated lipids    GERD (gastroesophageal reflux disease)    H/O blood clots    superficial blood clot in left leg below knee tx w/ eliquis x 3 mo   History of kidney stones    Kidney disease    Neuroendocrine tumor    Subdural hematoma ([facility])    hx of   Tobacco abuse    Unsteadiness    Past Surgical History:  Procedure Laterality Date   ABDOMINAL SURGERY     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   CHOLECYSTECTOMY     added umbilical mesh   COLONOSCOPY WITH PROPOFOL N/A 03/27/2017   Procedure: COLONOSCOPY WITH PROPOFOL;  Surgeon: Manya Silvas, MD;  Location[facility] ENDOSCOPY;  Service: Endoscopy;  Laterality: N/A;   ESOPHAGOGASTRODUODENOSCOPY (EGD) WITH PROPOFOL N/A 03/27/2017   Procedure: ESOPHAGOGASTRODUODENOSCOPY (EGD) WITH PROPOFOL;  Surgeon: Manya Silvas, MD;  Location: [facility] ENDOSCOPY;  Service: Endoscopy;  Laterality: N/A;   hip fracture with rod placement Right    MASS EXCISION Left  05/24/2021   Procedure: EXCISION MASS;  Surgeon: Benjamine Sprague, DO;  Location: [facility] ORS;  Service: General;  Laterality: Left;  Prone postition   Patient Active Problem List   Diagnosis Date Noted   AAA (abdominal aortic aneurysm) without rupture ([place]) 01/02/2022   RLS (restless legs syndrome) 09/19/2020   Varicose veins of both lower extremities 09/19/2020   TIA (transient ischemic attack) 01/01/2020   History of TIA (transient ischemic attack) 12/27/2019   PVC's (premature ventricular contractions) 12/27/2019   Pill dysphagia 03/17/2017   Hx of colonic polyps 12/05/2016   Irregular heart rate 12/05/2016   Hyperlipidemia 09/07/2015   Tobacco abuse 09/07/2015   Calculus, kidney 04/02/2015   Neuroendocrine tumor 06/22/2008   ONSET DATE: 05/19/2022  REFERRING DIAG: R26.9 (ICD-10-CM) - Gait difficulty R41.3 (ICD-10-CM) - Memory loss  THERAPY DIAG:  Dizziness and giddiness  Unsteadiness on feet  Difficulty in walking, not elsewhere classified  Muscle weakness (generalized)  Rationale for Evaluation and Treatment: Rehabilitation  SUBJECTIVE:  SUBJECTIVE STATEMENT:  Patient went to ortho today, is late due to valet issues.    Pt accompanied by: self  PERTINENT HISTORY:  66 year old male here for evaluation of gait and balance difficulty and memory loss. Symptoms have been present since 2021. He has developed numbness in his feet, gait and balance difficulty. Has difficulty in the shower when he closes eyes. He feels like he is drunk when he walks. He tends to lose his balance. He is fallen 10 times last few months.  PMH: Arthritis, hx of pancreas cancer, depression, hx of subdural hematoma , hx of hip fracture ~13 yrs ago   PAIN:  Are you having pain? Yes: NPRS scale: 3-4/10 Pain location:  coccyx and L sided rib Pain description: Soreness where he fell   PRECAUTIONS: Fall  WEIGHT BEARING RESTRICTIONS: No  FALLS: Has patient fallen in last 6 months? Yes. Number of falls >10 falls   LIVING ENVIRONMENT: Lives with: lives with their spouse and and wife's mother Lives in: House/apartment Stairs: No Has following equipment at home: Single point cane and Grab bars - has suction cup grab bars   PLOF: Independent  PATIENT GOALS: Wants to improve balance.   OBJECTIVE:   DIAGNOSTIC FINDINGS:  03/27/20 MRI BRAIN 1. Positive for diffuse bilateral subdural hematoma versus dural thickening, 2-3 mm in thickness throughout both the entire posterior fossa and bilateral supratentorial brain. Follow-up noncontrast Head CT might be able to confirm hyperdense blood products, although small SDH this size will frequently be occult by CT. Follow-up Post-contrast brain MRI would be valuable although a degree of dural thickening is typically seen with SDH. Otherwise CSF analysis might also be valuable. 2. No significant intracranial mass effect or other acute intracranial abnormality. 3. MRAs today are reported separately.  Brain and lumbar spine MRI scheduled for 06/09/22  IMPRESSION: This MRI of the lumbar spine without contrast shows the following: 1. At L4-L5, there is severe loss of disc height and other degenerative change causing moderately severe foraminal narrowing and moderate lateral recess stenosis.  There is potential for L4 nerve root compression.  There is no spinal stenosis. 2. At L5-S1, there are degenerative changes causing moderately severe foraminal narrowing and moderate lateral recess stenosis.  There is potential for L5 nerve root compression to either side.  There is no spinal stenosis. 3. Milder degenerative changes at the other lumbar levels do not lead to spinal stenosis or nerve root compression.   COGNITION: Overall cognitive status: Within functional limits  for tasks assessed, however pt reporting some memory issues.    SENSATION: Light touch: Reports numbness/tingling in feet and in thighs, with light touch pt more impaired distally in RLE  Proprioception: WFL at bilat ankles   COORDINATION: Heel to shin: WFL bilat   LOWER EXTREMITY MMT:    MMT Right Eval Left Eval  Hip flexion 4/5 4-/5  Hip extension    Hip abduction 5/5 5/5  Hip adduction 5/5 5/5  Hip internal rotation    Hip external rotation    Knee flexion 5/5 5/5  Knee extension 4/5 4+/5  Ankle dorsiflexion 3-/5 3-/5  Ankle plantarflexion    Ankle inversion    Ankle eversion    (Blank rows = not tested)  Strength testing performed in sitting.   TRANSFERS: Assistive device utilized: None  Sit to stand: SBA Stand to sit: SBA From standard chair can perform without UE support, but reports at home has difficulty getting up from lower surfaces like the couch.  GAIT: Gait pattern: decreased ankle dorsiflexion- Right, decreased ankle dorsiflexion- Left, Right steppage, Left steppage, narrow BOS, poor foot clearance- Right, and poor foot clearance- Left Distance walked: Clinic distances  Assistive device utilized: None Level of assistance: SBA and CGA Comments: Pt with bilat foot drop and unsteadiness with gait with some staggering requiring CGA. Pt with tendency to almost scissor at times and have a narrow BOS.   FUNCTIONAL TESTS:  5 times sit to stand: 15.63 seconds with no UE support  Timed up and go (TUG): 9 seconds, cog TUG: 14.85 seconds 10 meter walk test: 11.16 seconds = 2.93 ft/sec with no AD   Feet together EO on level ground: 30 seconds, on air ex: 30 seconds  M-CTSIB (with feet hip width distance)  Condition 1: Firm Surface, EO 30 Sec, Normal Sway  Condition 2: Firm Surface, EC 30 Sec, Mild Sway, pt shifting weight posteriorly on heels  Condition 3: Foam Surface, EO 30 Sec, Mild Sway  Condition 4: Foam Surface, EC 3 Sec, pt losing balance to the R.       TODAY'S TREATMENT: DATE: 06/25/22  Review HEP: Access Code: [email or web address] URL: [email or web address] Date: 06/24/2022 Prepared by: Janna Arch  Exercises - Seated Ankle Alphabet  - 1 x daily - 7 x weekly - 2 sets - 10 reps - 5 hold - Seated Ankle Eversion with Resistance  - 1 x daily - 7 x weekly - 2 sets - 10 reps - 5 hold - Seated Ankle Dorsiflexion with Resistance  - 1 x daily - 7 x weekly - 2 sets - 10 reps - 5 hold  Neuro Re-ed:  VOR  x10 nods x 3 trials ;  Seated alphabet 1x each LE; terminated halfway through RLE; able to perform full alphabet LLE.  Airex pad; sort alphabet into capital alphabet and lower case alphabet x9 minutes; one seated rest break  TherEx: Seated ankle eversion against resistance RTB 10x ; 2 sets Seated ankle df with resistance RTB  10x 2 sets  Ambulate 550 ft with CGA and cues for safety awareness with [facility]   PATIENT EDUCATION:  Education details: HEP, indications of testing  Person educated: Patient Education method: Explanation Education comprehension: verbalized understanding  HOME EXERCISE PROGRAM: Access Code: [email or web address] URL: [email or web address] Date: 06/24/2022 Prepared by: Janna Arch  Exercises - Seated Ankle Alphabet  - 1 x daily - 7 x weekly - 2 sets - 10 reps - 5 hold - Seated Ankle Eversion with Resistance  - 1 x daily - 7 x weekly - 2 sets - 10 reps - 5 hold - Seated Ankle Dorsiflexion with Resistance  - 1 x daily - 7 x weekly - 2 sets - 10 reps - 5 hold  Access Code: [email or web address] URL: [email or web address] Date: 06/23/2022 Prepared by: Ricard Dillon  Exercises - Seated Gaze Stabilization with Head Rotation  - 1 x daily - 7 x weekly - 3 sets - 2 reps - 30 seconds hold  Access Code: [email or web address] URL: [email or web address] Date: 06/03/2022 Prepared by: Janna Arch Exercises - Seated Long Arc Quad  - 1 x daily - 5 x weekly - 3 sets - 10 reps - 5 hold - Seated Toe Raise  - 1 x  daily - 5 x weekly - 3 sets - 10 reps - Sit to Stand Without Arm Support  - 1 x daily - 5 x weekly - 2 sets - 10 reps - Romberg Stance with Eyes Closed  - 1 x daily - 5 x weekly -  1 sets - 3 reps - 30 hold - Tandem Stance  - 1 x daily - 5 x weekly - 1 sets - 3 reps - 30 hold - Standing Single Leg Stance with Counter Support  - 1 x daily - 5 x weekly - 1 sets - 3 reps - 30 hold   GOALS: Goals reviewed with patient? Yes  SHORT TERM GOALS: Target date: 06/26/2022  Pt will be independent with initial HEP in order to build upon functional gains made in therapy. Baseline: Goal status:   2.  Patient will increase Dynamic Gait Index to >19/24 as to demonstrate reduced falls risk and improved dynamic gait balance for better safety with community/home ambulation.  Baseline: 06/03/22: 12/24 Goal status: INITIAL  3.  Pt will improve 5x sit<>stand to less than or equal to 14 sec to demonstrate improved functional strength and transfer efficiency.  Baseline: 15.63 seconds with no UE support Goal status: INITIAL  4.  Pt will ambulate at least 300' over level indoor/outdoor paved surfaces with supervision and LRAD in order to demo improved safety with mobility.  Baseline: No AD over level surfaces, supervision/CGA and unsteadiness.  Goal status: INITIAL  LONG TERM GOALS: Target date: 07/24/2022  Pt will be independent with final HEP in order to build upon functional gains made in therapy. Baseline:  Goal status: INITIAL  2.  Patient will increase dynamic gait index score to >19/24 as to demonstrate reduced fall risk and improved dynamic gait balance for better safety with community/home ambulation.  Baseline: 06/03/22: 12/24 Goal status: INITIAL  3.  Pt will improve condition 4 of mCTSIB to at least 12 seconds in order to demo improved vestibular input for balance.  Baseline: 3 seconds, pt losing balance to R.  Goal status: INITIAL  4.  Pt will improve gait speed with LRAD or appropriate braces/no  AD to at least 3.3 ft/sec in order to demo improved community mobility.   Baseline: 11.16 seconds = 2.93 ft/sec with no AD  Goal status: INITIAL  5.  Pt will ambulate at least 500' outdoors over paved/grass surfaces with LRAD and supervision in order to demo improved community mobility.  Baseline:  Goal status: INITIAL   ASSESSMENT:  CLINICAL IMPRESSION: Per Dr. Candelaria Stagers; He has a non-displaced ischiopubic bone fracture from his recent fall. The treatment of this is non-surgical. He can be WBAT as long as it is below the threshold of pain. You can do therapy around the hip but limit painful weight bearing, end range hip ROM that causes pain. He may be getting compensatory trochanteric bursa pain and posterior hip muscle pain from walking on his hip and continuing to cause pain.  Reviewed HEP vestibular and neuro with patient demonstrating understanding.The pt will benefit from further skilled PT to improve dizziness, balance, and mobility to decrease fall risk and increase QOL.  OBJECTIVE IMPAIRMENTS: Abnormal gait, decreased activity tolerance, decreased balance, decreased knowledge of use of DME, difficulty walking, decreased ROM, decreased strength, and impaired sensation.   ACTIVITY LIMITATIONS: bending, stairs, transfers, and locomotion level  PARTICIPATION LIMITATIONS: community activity and yard work  PERSONAL FACTORS: Past/current experiences, Time since onset of injury/illness/exacerbation, 3+ comorbidities: Arthritis, hx of pancreas cancer, depression, hx of subdural hematoma , hx of hip fracture ~13 yrs ago , and frequent falls (>10 in the past 6 months)  are also affecting patient's functional outcome.   REHAB POTENTIAL: Good  CLINICAL DECISION MAKING: Evolving/moderate complexity  EVALUATION COMPLEXITY: Moderate  PLAN:  PT FREQUENCY: 2x/week  PT DURATION: 8 weeks  PLANNED INTERVENTIONS: Therapeutic exercises, Therapeutic activity, Neuromuscular re-education, Balance  training, Gait training, Patient/Family education, Self Care, Stair training, Vestibular training, Orthotic/Fit training, DME instructions, and Re-evaluation  PLAN FOR NEXT SESSION: VOR, balance, continue plan   Janna Arch, PT, DPT  [facility]  06/25/22, 5:34 PM

## 2022-06-25 ENCOUNTER — Ambulatory Visit: Payer: BC Managed Care – PPO

## 2022-06-25 DIAGNOSIS — R42 Dizziness and giddiness: Secondary | ICD-10-CM | POA: Diagnosis not present

## 2022-06-25 DIAGNOSIS — R2681 Unsteadiness on feet: Secondary | ICD-10-CM

## 2022-06-25 DIAGNOSIS — R262 Difficulty in walking, not elsewhere classified: Secondary | ICD-10-CM

## 2022-06-25 DIAGNOSIS — M6281 Muscle weakness (generalized): Secondary | ICD-10-CM

## 2022-07-01 NOTE — Therapy (Incomplete)
OUTPATIENT PHYSICAL THERAPY NEURO TREATMENT  Patient Name: Jared [name] MRN: [id number] DOB:[birth year], 66 y.o., male Today's Date: 07/01/2022  PCP:  [name], MD  REFERRING PROVIDER: [name], MD    Past Medical History:  Diagnosis Date   Arthritis    Cancer [facility])    pancreas   Complication of anesthesia    tends to come out of anesthesia during procedure   Depression    Dysphagia    Elevated lipids    GERD (gastroesophageal reflux disease)    H/O blood clots    superficial blood clot in left leg below knee tx w/ eliquis x 3 mo   History of kidney stones    Kidney disease    Neuroendocrine tumor    Subdural hematoma (HCC)    hx of   Tobacco abuse    Unsteadiness    Past Surgical History:  Procedure Laterality Date   ABDOMINAL SURGERY     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   CHOLECYSTECTOMY     added umbilical mesh   COLONOSCOPY WITH PROPOFOL N/A 03/27/2017   Procedure: COLONOSCOPY WITH PROPOFOL;  Surgeon: Manya Silvas, MD;  Location: [facility] ENDOSCOPY;  Service: Endoscopy;  Laterality: N/A;   ESOPHAGOGASTRODUODENOSCOPY (EGD) WITH PROPOFOL N/A 03/27/2017   Procedure: ESOPHAGOGASTRODUODENOSCOPY (EGD) WITH PROPOFOL;  Surgeon: Manya Silvas, MD;  Location: [facility] ENDOSCOPY;  Service: Endoscopy;  Laterality: N/A;   hip fracture with rod placement Right    MASS EXCISION Left 05/24/2021   Procedure: EXCISION MASS;  Surgeon: Benjamine Sprague, DO;  Location: [facility] ORS;  Service: General;  Laterality: Left;  Prone postition   Patient Active Problem List   Diagnosis Date Noted   AAA (abdominal aortic aneurysm) without rupture ([place]) 01/02/2022   RLS (restless legs syndrome) 09/19/2020   Varicose veins of both lower extremities 09/19/2020   TIA (transient ischemic attack) 01/01/2020   History of TIA (transient ischemic attack) 12/27/2019   PVC's (premature ventricular contractions) 12/27/2019   Pill dysphagia 03/17/2017   Hx of colonic polyps  12/05/2016   Irregular heart rate 12/05/2016   Hyperlipidemia 09/07/2015   Tobacco abuse 09/07/2015   Calculus, kidney 04/02/2015   Neuroendocrine tumor 06/22/2008   ONSET DATE: 05/19/2022  REFERRING DIAG: R26.9 (ICD-10-CM) - Gait difficulty R41.3 (ICD-10-CM) - Memory loss  THERAPY DIAG:  No diagnosis found.  Rationale for Evaluation and Treatment: Rehabilitation  SUBJECTIVE:                                                                                                                                                                                             SUBJECTIVE STATEMENT:  ***  Pt accompanied by: self  PERTINENT HISTORY:  66 year old male here for evaluation of gait and balance difficulty and memory loss. Symptoms have been present since 2021. He has developed numbness in his feet, gait and balance difficulty. Has difficulty in the shower when he closes eyes. He feels like he is drunk when he walks. He tends to lose his balance. He is fallen 10 times last few months.  PMH: Arthritis, hx of pancreas cancer, depression, hx of subdural hematoma , hx of hip fracture ~13 yrs ago   Per Dr. Candelaria Stagers; He has a non-displaced ischiopubic bone fracture from his recent fall. The treatment of this is non-surgical. He can be WBAT as long as it is below the threshold of pain. You can do therapy around the hip but limit painful weight bearing, end range hip ROM that causes pain. He may be getting compensatory trochanteric bursa pain and posterior hip muscle pain from walking on his hip and continuing to cause pain.  PAIN:  Are you having pain? Yes: NPRS scale: 3-4/10 Pain location: coccyx and L sided rib Pain description: Soreness where he fell   PRECAUTIONS: Fall  WEIGHT BEARING RESTRICTIONS: No  FALLS: Has patient fallen in last 6 months? Yes. Number of falls >10 falls   LIVING ENVIRONMENT: Lives with: lives with their spouse and and wife's mother Lives in:  House/apartment Stairs: No Has following equipment at home: Single point cane and Grab bars - has suction cup grab bars   PLOF: Independent  PATIENT GOALS: Wants to improve balance.   OBJECTIVE:   DIAGNOSTIC FINDINGS:  03/27/20 MRI BRAIN 1. Positive for diffuse bilateral subdural hematoma versus dural thickening, 2-3 mm in thickness throughout both the entire posterior fossa and bilateral supratentorial brain. Follow-up noncontrast Head CT might be able to confirm hyperdense blood products, although small SDH this size will frequently be occult by CT. Follow-up Post-contrast brain MRI would be valuable although a degree of dural thickening is typically seen with SDH. Otherwise CSF analysis might also be valuable. 2. No significant intracranial mass effect or other acute intracranial abnormality. 3. MRAs today are reported separately.  Brain and lumbar spine MRI scheduled for 06/09/22  IMPRESSION: This MRI of the lumbar spine without contrast shows the following: 1. At L4-L5, there is severe loss of disc height and other degenerative change causing moderately severe foraminal narrowing and moderate lateral recess stenosis.  There is potential for L4 nerve root compression.  There is no spinal stenosis. 2. At L5-S1, there are degenerative changes causing moderately severe foraminal narrowing and moderate lateral recess stenosis.  There is potential for L5 nerve root compression to either side.  There is no spinal stenosis. 3. Milder degenerative changes at the other lumbar levels do not lead to spinal stenosis or nerve root compression.   COGNITION: Overall cognitive status: Within functional limits for tasks assessed, however pt reporting some memory issues.    SENSATION: Light touch: Reports numbness/tingling in feet and in thighs, with light touch pt more impaired distally in RLE  Proprioception: WFL at bilat ankles   COORDINATION: Heel to shin: WFL bilat   LOWER EXTREMITY  MMT:    MMT Right Eval Left Eval  Hip flexion 4/5 4-/5  Hip extension    Hip abduction 5/5 5/5  Hip adduction 5/5 5/5  Hip internal rotation    Hip external rotation    Knee flexion 5/5 5/5  Knee extension 4/5 4+/5  Ankle dorsiflexion 3-/5 3-/5  Ankle plantarflexion  Ankle inversion    Ankle eversion    (Blank rows = not tested)  Strength testing performed in sitting.   TRANSFERS: Assistive device utilized: None  Sit to stand: SBA Stand to sit: SBA From standard chair can perform without UE support, but reports at home has difficulty getting up from lower surfaces like the couch.   GAIT: Gait pattern: decreased ankle dorsiflexion- Right, decreased ankle dorsiflexion- Left, Right steppage, Left steppage, narrow BOS, poor foot clearance- Right, and poor foot clearance- Left Distance walked: Clinic distances  Assistive device utilized: None Level of assistance: SBA and CGA Comments: Pt with bilat foot drop and unsteadiness with gait with some staggering requiring CGA. Pt with tendency to almost scissor at times and have a narrow BOS.   FUNCTIONAL TESTS:  5 times sit to stand: 15.63 seconds with no UE support  Timed up and go (TUG): 9 seconds, cog TUG: 14.85 seconds 10 meter walk test: 11.16 seconds = 2.93 ft/sec with no AD   Feet together EO on level ground: 30 seconds, on air ex: 30 seconds  M-CTSIB (with feet hip width distance)  Condition 1: Firm Surface, EO 30 Sec, Normal Sway  Condition 2: Firm Surface, EC 30 Sec, Mild Sway, pt shifting weight posteriorly on heels  Condition 3: Foam Surface, EO 30 Sec, Mild Sway  Condition 4: Foam Surface, EC 3 Sec, pt losing balance to the R.      TODAY'S TREATMENT: DATE: 07/01/22   Neuro Re-ed:  VOR  x10 nods x 3 trials ;  Seated alphabet 1x each LE; terminated halfway through RLE; able to perform full alphabet LLE.  Airex pad; sort alphabet into capital alphabet and lower case alphabet x9 minutes; one seated rest  break  TherEx: Seated ankle eversion against resistance RTB 10x ; 2 sets Seated ankle df with resistance RTB  10x 2 sets  Ambulate 550 ft with CGA and cues for safety awareness with [facility]   PATIENT EDUCATION:  Education details: HEP, indications of testing  Person educated: Patient Education method: Explanation Education comprehension: verbalized understanding  HOME EXERCISE PROGRAM: Access Code: [email or web address] URL: [email or web address] Date: 06/24/2022 Prepared by: Janna Arch  Exercises - Seated Ankle Alphabet  - 1 x daily - 7 x weekly - 2 sets - 10 reps - 5 hold - Seated Ankle Eversion with Resistance  - 1 x daily - 7 x weekly - 2 sets - 10 reps - 5 hold - Seated Ankle Dorsiflexion with Resistance  - 1 x daily - 7 x weekly - 2 sets - 10 reps - 5 hold  Access Code: [email or web address] URL: [email or web address] Date: 06/23/2022 Prepared by: Ricard Dillon  Exercises - Seated Gaze Stabilization with Head Rotation  - 1 x daily - 7 x weekly - 3 sets - 2 reps - 30 seconds hold  Access Code: [email or web address] URL: [email or web address] Date: 06/03/2022 Prepared by: Janna Arch Exercises - Seated Long Arc Quad  - 1 x daily - 5 x weekly - 3 sets - 10 reps - 5 hold - Seated Toe Raise  - 1 x daily - 5 x weekly - 3 sets - 10 reps - Sit to Stand Without Arm Support  - 1 x daily - 5 x weekly - 2 sets - 10 reps - Romberg Stance with Eyes Closed  - 1 x daily - 5 x weekly - 1 sets - 3 reps - 30 hold - Tandem Stance  - 1 x daily - 5 x weekly - 1 sets -  3 reps - 30 hold - Standing Single Leg Stance with Counter Support  - 1 x daily - 5 x weekly - 1 sets - 3 reps - 30 hold   GOALS: Goals reviewed with patient? Yes  SHORT TERM GOALS: Target date: 06/26/2022  Pt will be independent with initial HEP in order to build upon functional gains made in therapy. Baseline: Goal status:   2.  Patient will increase Dynamic Gait Index to >19/24 as to demonstrate reduced falls risk and  improved dynamic gait balance for better safety with community/home ambulation.  Baseline: 06/03/22: 12/24 Goal status: INITIAL  3.  Pt will improve 5x sit<>stand to less than or equal to 14 sec to demonstrate improved functional strength and transfer efficiency.  Baseline: 15.63 seconds with no UE support Goal status: INITIAL  4.  Pt will ambulate at least 300' over level indoor/outdoor paved surfaces with supervision and LRAD in order to demo improved safety with mobility.  Baseline: No AD over level surfaces, supervision/CGA and unsteadiness.  Goal status: INITIAL  LONG TERM GOALS: Target date: 07/24/2022  Pt will be independent with final HEP in order to build upon functional gains made in therapy. Baseline:  Goal status: INITIAL  2.  Patient will increase dynamic gait index score to >19/24 as to demonstrate reduced fall risk and improved dynamic gait balance for better safety with community/home ambulation.  Baseline: 06/03/22: 12/24 Goal status: INITIAL  3.  Pt will improve condition 4 of mCTSIB to at least 12 seconds in order to demo improved vestibular input for balance.  Baseline: 3 seconds, pt losing balance to R.  Goal status: INITIAL  4.  Pt will improve gait speed with LRAD or appropriate braces/no AD to at least 3.3 ft/sec in order to demo improved community mobility.   Baseline: 11.16 seconds = 2.93 ft/sec with no AD  Goal status: INITIAL  5.  Pt will ambulate at least 500' outdoors over paved/grass surfaces with LRAD and supervision in order to demo improved community mobility.  Baseline:  Goal status: INITIAL   ASSESSMENT:  CLINICAL IMPRESSION: *** The pt will benefit from further skilled PT to improve dizziness, balance, and mobility to decrease fall risk and increase QOL.  OBJECTIVE IMPAIRMENTS: Abnormal gait, decreased activity tolerance, decreased balance, decreased knowledge of use of DME, difficulty walking, decreased ROM, decreased strength, and impaired  sensation.   ACTIVITY LIMITATIONS: bending, stairs, transfers, and locomotion level  PARTICIPATION LIMITATIONS: community activity and yard work  PERSONAL FACTORS: Past/current experiences, Time since onset of injury/illness/exacerbation, 3+ comorbidities: Arthritis, hx of pancreas cancer, depression, hx of subdural hematoma , hx of hip fracture ~13 yrs ago , and frequent falls (>10 in the past 6 months)  are also affecting patient's functional outcome.   REHAB POTENTIAL: Good  CLINICAL DECISION MAKING: Evolving/moderate complexity  EVALUATION COMPLEXITY: Moderate  PLAN:  PT FREQUENCY: 2x/week  PT DURATION: 8 weeks  PLANNED INTERVENTIONS: Therapeutic exercises, Therapeutic activity, Neuromuscular re-education, Balance training, Gait training, Patient/Family education, Self Care, Stair training, Vestibular training, Orthotic/Fit training, DME instructions, and Re-evaluation  PLAN FOR NEXT SESSION: VOR, balance, continue plan   Janna Arch, PT, DPT  [facility]  07/01/22, 3:29 PM

## 2022-07-02 ENCOUNTER — Ambulatory Visit: Payer: BC Managed Care – PPO

## 2022-07-08 ENCOUNTER — Ambulatory Visit: Payer: BC Managed Care – PPO

## 2022-07-08 DIAGNOSIS — R42 Dizziness and giddiness: Secondary | ICD-10-CM

## 2022-07-08 DIAGNOSIS — R296 Repeated falls: Secondary | ICD-10-CM

## 2022-07-08 DIAGNOSIS — R262 Difficulty in walking, not elsewhere classified: Secondary | ICD-10-CM

## 2022-07-08 DIAGNOSIS — R2681 Unsteadiness on feet: Secondary | ICD-10-CM

## 2022-07-08 DIAGNOSIS — M6281 Muscle weakness (generalized): Secondary | ICD-10-CM

## 2022-07-08 NOTE — Therapy (Signed)
OUTPATIENT PHYSICAL THERAPY NEURO TREATMENT  Patient Name: Jared [name] MRN: [id number] DOB:[birth year], 66 y.o., male Today's Date: 07/08/2022  PCP:  [name], MD  REFERRING PROVIDER: [name], MD  PT End of Session - 07/08/22 1522     Visit Number 9    Number of Visits 17    Date for PT Re-Evaluation 07/28/22    Authorization Type [employer]    PT Start Time 1519    PT Stop Time 1600    PT Time Calculation (min) 41 min    Equipment Utilized During Treatment Gait belt    Activity Tolerance Patient tolerated treatment well;Other (comment)   pt required prolonged rest break (monitored by SPT and another PT) following session due to increased dizziness symptoms, improved with rest   Behavior During Therapy Impulsive;WFL for tasks assessed/performed              Past Medical History:  Diagnosis Date   Arthritis    Cancer ([place])    pancreas   Complication of anesthesia    tends to come out of anesthesia during procedure   Depression    Dysphagia    Elevated lipids    GERD (gastroesophageal reflux disease)    H/O blood clots    superficial blood clot in left leg below knee tx w/ eliquis x 3 mo   History of kidney stones    Kidney disease    Neuroendocrine tumor    Subdural hematoma ([facility])    hx of   Tobacco abuse    Unsteadiness    Past Surgical History:  Procedure Laterality Date   ABDOMINAL SURGERY     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   CHOLECYSTECTOMY     added umbilical mesh   COLONOSCOPY WITH PROPOFOL N/A 03/27/2017   Procedure: COLONOSCOPY WITH PROPOFOL;  Surgeon: Manya Silvas, MD;  Location[facility] ENDOSCOPY;  Service: Endoscopy;  Laterality: N/A;   ESOPHAGOGASTRODUODENOSCOPY (EGD) WITH PROPOFOL N/A 03/27/2017   Procedure: ESOPHAGOGASTRODUODENOSCOPY (EGD) WITH PROPOFOL;  Surgeon: Manya Silvas, MD;  Location: [facility] ENDOSCOPY;  Service: Endoscopy;  Laterality: N/A;   hip fracture with rod placement Right    MASS EXCISION Left  05/24/2021   Procedure: EXCISION MASS;  Surgeon: Benjamine Sprague, DO;  Location: [facility] ORS;  Service: General;  Laterality: Left;  Prone postition   Patient Active Problem List   Diagnosis Date Noted   AAA (abdominal aortic aneurysm) without rupture ([place]) 01/02/2022   RLS (restless legs syndrome) 09/19/2020   Varicose veins of both lower extremities 09/19/2020   TIA (transient ischemic attack) 01/01/2020   History of TIA (transient ischemic attack) 12/27/2019   PVC's (premature ventricular contractions) 12/27/2019   Pill dysphagia 03/17/2017   Hx of colonic polyps 12/05/2016   Irregular heart rate 12/05/2016   Hyperlipidemia 09/07/2015   Tobacco abuse 09/07/2015   Calculus, kidney 04/02/2015   Neuroendocrine tumor 06/22/2008   ONSET DATE: 05/19/2022  REFERRING DIAG: R26.9 (ICD-10-CM) - Gait difficulty R41.3 (ICD-10-CM) - Memory loss  THERAPY DIAG:  Dizziness and giddiness  Unsteadiness on feet  Difficulty in walking, not elsewhere classified  Muscle weakness (generalized)  Repeated falls  Rationale for Evaluation and Treatment: Rehabilitation  SUBJECTIVE:  SUBJECTIVE STATEMENT:  Pt reports he gets loosened up when walking, but then goes to sit down and gets stiff again after sitting for a prolonged period.  Pt notes that he was feeling good over the last part of the week and was even forgetting his cane.  Pt states he has been working on his VOR and that has gotten better since the last visit.    Pt accompanied by: self  PERTINENT HISTORY:  66 year old male here for evaluation of gait and balance difficulty and memory loss. Symptoms have been present since 2021. He has developed numbness in his feet, gait and balance difficulty. Has difficulty in the shower when he closes eyes. He feels like  he is drunk when he walks. He tends to lose his balance. He is fallen 10 times last few months.  PMH: Arthritis, hx of pancreas cancer, depression, hx of subdural hematoma , hx of hip fracture ~13 yrs ago   PAIN:  Are you having pain? Yes: NPRS scale: 3-4/10 Pain location: coccyx and L sided rib Pain description: Soreness where he fell   PRECAUTIONS: Fall  WEIGHT BEARING RESTRICTIONS: No  FALLS: Has patient fallen in last 6 months? Yes. Number of falls >10 falls   LIVING ENVIRONMENT: Lives with: lives with their spouse and and wife's mother Lives in: House/apartment Stairs: No Has following equipment at home: Single point cane and Grab bars - has suction cup grab bars   PLOF: Independent  PATIENT GOALS: Wants to improve balance.   OBJECTIVE:   DIAGNOSTIC FINDINGS:  03/27/20 MRI BRAIN 1. Positive for diffuse bilateral subdural hematoma versus dural thickening, 2-3 mm in thickness throughout both the entire posterior fossa and bilateral supratentorial brain. Follow-up noncontrast Head CT might be able to confirm hyperdense blood products, although small SDH this size will frequently be occult by CT. Follow-up Post-contrast brain MRI would be valuable although a degree of dural thickening is typically seen with SDH. Otherwise CSF analysis might also be valuable. 2. No significant intracranial mass effect or other acute intracranial abnormality. 3. MRAs today are reported separately.  Brain and lumbar spine MRI scheduled for 06/09/22  IMPRESSION: This MRI of the lumbar spine without contrast shows the following: 1. At L4-L5, there is severe loss of disc height and other degenerative change causing moderately severe foraminal narrowing and moderate lateral recess stenosis.  There is potential for L4 nerve root compression.  There is no spinal stenosis. 2. At L5-S1, there are degenerative changes causing moderately severe foraminal narrowing and moderate lateral recess stenosis.   There is potential for L5 nerve root compression to either side.  There is no spinal stenosis. 3. Milder degenerative changes at the other lumbar levels do not lead to spinal stenosis or nerve root compression.   COGNITION: Overall cognitive status: Within functional limits for tasks assessed, however pt reporting some memory issues.    SENSATION: Light touch: Reports numbness/tingling in feet and in thighs, with light touch pt more impaired distally in RLE  Proprioception: WFL at bilat ankles   COORDINATION: Heel to shin: WFL bilat   LOWER EXTREMITY MMT:    MMT Right Eval Left Eval  Hip flexion 4/5 4-/5  Hip extension    Hip abduction 5/5 5/5  Hip adduction 5/5 5/5  Hip internal rotation    Hip external rotation    Knee flexion 5/5 5/5  Knee extension 4/5 4+/5  Ankle dorsiflexion 3-/5 3-/5  Ankle plantarflexion    Ankle inversion    Ankle eversion    (  Blank rows = not tested)  Strength testing performed in sitting.   TRANSFERS: Assistive device utilized: None  Sit to stand: SBA Stand to sit: SBA From standard chair can perform without UE support, but reports at home has difficulty getting up from lower surfaces like the couch.   GAIT: Gait pattern: decreased ankle dorsiflexion- Right, decreased ankle dorsiflexion- Left, Right steppage, Left steppage, narrow BOS, poor foot clearance- Right, and poor foot clearance- Left Distance walked: Clinic distances  Assistive device utilized: None Level of assistance: SBA and CGA Comments: Pt with bilat foot drop and unsteadiness with gait with some staggering requiring CGA. Pt with tendency to almost scissor at times and have a narrow BOS.    FUNCTIONAL TESTS:  5 times sit to stand: 15.63 seconds with no UE support  Timed up and go (TUG): 9 seconds, cog TUG: 14.85 seconds 10 meter walk test: 11.16 seconds = 2.93 ft/sec with no AD   Feet together EO on level ground: 30 seconds, on air ex: 30 seconds  M-CTSIB (with feet hip  width distance)  Condition 1: Firm Surface, EO 30 Sec, Normal Sway  Condition 2: Firm Surface, EC 30 Sec, Mild Sway, pt shifting weight posteriorly on heels  Condition 3: Foam Surface, EO 30 Sec, Mild Sway  Condition 4: Foam Surface, EC 3 Sec, pt losing balance to the R.      TODAY'S TREATMENT: DATE: 07/08/22  Neuro Re-ed:   VOR x1 with horizontal head turns, x10 VOR x1 with vertical head turns, x10 Seated alphabet 1x each LE; split alphabet into 3rds and pt was able to perform without significant increase in pain. Airex pad; sort alphabet into capital alphabet and lower case alphabet x9 minutes; one seated rest break   TherEx:  Ambulate 600 ft with CGA and cues for safety awareness without AD Seated hamstring curls with BTB, 2x10 each LE Seated hip adduction into rainbow physioball, 2x10 with 3 sec holds Seated LAQ with hip adduction squeeze into physioball, 2x10 each LE    PATIENT EDUCATION:  Education details: HEP, indications of testing  Person educated: Patient Education method: Explanation Education comprehension: verbalized understanding  HOME EXERCISE PROGRAM: Access Code: [email or web address] URL: [email or web address] Date: 06/24/2022 Prepared by: Janna Arch  Exercises - Seated Ankle Alphabet  - 1 x daily - 7 x weekly - 2 sets - 10 reps - 5 hold - Seated Ankle Eversion with Resistance  - 1 x daily - 7 x weekly - 2 sets - 10 reps - 5 hold - Seated Ankle Dorsiflexion with Resistance  - 1 x daily - 7 x weekly - 2 sets - 10 reps - 5 hold  Access Code: [email or web address] URL: [email or web address] Date: 06/23/2022 Prepared by: Ricard Dillon  Exercises - Seated Gaze Stabilization with Head Rotation  - 1 x daily - 7 x weekly - 3 sets - 2 reps - 30 seconds hold  Access Code: [email or web address] URL: [email or web address] Date: 06/03/2022 Prepared by: Janna Arch Exercises - Seated Long Arc Quad  - 1 x daily - 5 x weekly - 3 sets - 10 reps - 5 hold -  Seated Toe Raise  - 1 x daily - 5 x weekly - 3 sets - 10 reps - Sit to Stand Without Arm Support  - 1 x daily - 5 x weekly - 2 sets - 10 reps - Romberg Stance with Eyes Closed  - 1 x daily - 5 x weekly - 1 sets - 3 reps - 30 hold - Tandem Stance  -  1 x daily - 5 x weekly - 1 sets - 3 reps - 30 hold - Standing Single Leg Stance with Counter Support  - 1 x daily - 5 x weekly - 1 sets - 3 reps - 30 hold   GOALS: Goals reviewed with patient? Yes  SHORT TERM GOALS: Target date: 06/26/2022  Pt will be independent with initial HEP in order to build upon functional gains made in therapy. Baseline: Goal status:   2.  Patient will increase Dynamic Gait Index to >19/24 as to demonstrate reduced falls risk and improved dynamic gait balance for better safety with community/home ambulation.  Baseline: 06/03/22: 12/24 Goal status: INITIAL  3.  Pt will improve 5x sit<>stand to less than or equal to 14 sec to demonstrate improved functional strength and transfer efficiency.  Baseline: 15.63 seconds with no UE support Goal status: INITIAL  4.  Pt will ambulate at least 300' over level indoor/outdoor paved surfaces with supervision and LRAD in order to demo improved safety with mobility.  Baseline: No AD over level surfaces, supervision/CGA and unsteadiness.  Goal status: INITIAL  LONG TERM GOALS: Target date: 07/24/2022  Pt will be independent with final HEP in order to build upon functional gains made in therapy. Baseline:  Goal status: INITIAL  2.  Patient will increase dynamic gait index score to >19/24 as to demonstrate reduced fall risk and improved dynamic gait balance for better safety with community/home ambulation.  Baseline: 06/03/22: 12/24 Goal status: INITIAL  3.  Pt will improve condition 4 of mCTSIB to at least 12 seconds in order to demo improved vestibular input for balance.  Baseline: 3 seconds, pt losing balance to R.  Goal status: INITIAL  4.  Pt will improve gait speed with LRAD  or appropriate braces/no AD to at least 3.3 ft/sec in order to demo improved community mobility.   Baseline: 11.16 seconds = 2.93 ft/sec with no AD  Goal status: INITIAL  5.  Pt will ambulate at least 500' outdoors over paved/grass surfaces with LRAD and supervision in order to demo improved community mobility.  Baseline:  Goal status: INITIAL   ASSESSMENT:  CLINICAL IMPRESSION:  Pt educated on continued use of the HEP in the morning in order to stretch and loosen up his joint before getting up in the morning.  Pt then told to continue with most current HEP as it introduced new exercises to target groin pain likely due to tightness experienced from exercising.  Therapist reiterated pt to utilize pain as measurement for reducing bouts of exercise or the range in which he performs the exercise.  Pt agreed.   Pt will continue to benefit from skilled therapy to address remaining deficits in order to improve overall QoL and return to PLOF.      OBJECTIVE IMPAIRMENTS: Abnormal gait, decreased activity tolerance, decreased balance, decreased knowledge of use of DME, difficulty walking, decreased ROM, decreased strength, and impaired sensation.   ACTIVITY LIMITATIONS: bending, stairs, transfers, and locomotion level  PARTICIPATION LIMITATIONS: community activity and yard work  PERSONAL FACTORS: Past/current experiences, Time since onset of injury/illness/exacerbation, 3+ comorbidities: Arthritis, hx of pancreas cancer, depression, hx of subdural hematoma , hx of hip fracture ~13 yrs ago , and frequent falls (>10 in the past 6 months)  are also affecting patient's functional outcome.   REHAB POTENTIAL: Good  CLINICAL DECISION MAKING: Evolving/moderate complexity  EVALUATION COMPLEXITY: Moderate  PLAN:  PT FREQUENCY: 2x/week  PT DURATION: 8 weeks  PLANNED INTERVENTIONS: Therapeutic exercises, Therapeutic activity,  Neuromuscular re-education, Balance training, Gait training, Patient/Family  education, Self Care, Stair training, Vestibular training, Orthotic/Fit training, DME instructions, and Re-evaluation  PLAN FOR NEXT SESSION:   VOR, balance, continue plan   Gwenlyn Saran, PT, DPT Physical Therapist- [facility]  07/08/22, 4:26 PM

## 2022-07-11 ENCOUNTER — Ambulatory Visit: Payer: BC Managed Care – PPO

## 2022-07-11 NOTE — Therapy (Signed)
  OUTPATIENT PHYSICAL THERAPY NEURO TREATMENT  Patient Name: Jared [name] MRN: [id number] DOB:1956-04-13, 66 y.o., male Today's Date: 07/11/2022  PCP:  [name], MD  REFERRING PROVIDER: [name], MD  SUBJECTIVE:                                                                                                                                                                                             SUBJECTIVE STATEMENT:  Pt reports he experienced a fall again last night and fell on a wicker basket on his L side.  Pt notes that he experiencing increased pain in the L flank and when asked to breathe deeply he has sharp pain in his ribs.  Therapy session concluded and wheeled pt up to [facility] to be evaluated.  Once up there, pt advised there was a 3 hour wait and pt elected to go somewhere else.  Pt then wheeled to the valet area in which the pt stated he would go somewhere else or come back in the morning.  Pt advised by the therapist to get checked out today if at all possible.  Pt agreed and left at [place] station.     Gwenlyn Saran, PT, DPT Physical Therapist- [facility]  07/11/22, 9:39 AM

## 2022-07-12 NOTE — Therapy (Incomplete)
OUTPATIENT PHYSICAL THERAPY NEURO TREATMENT/Physical Therapy Progress Note   Dates of reporting period  05/28/22   to   ***   Patient Name: Jared [name] MRN: [id number] DOB:06/21/1956, 66 y.o., male Today's Date: 07/12/2022  PCP:  [name], MD  REFERRING PROVIDER: [name], MD     Past Medical History:  Diagnosis Date   Arthritis    Cancer 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   CHOLECYSTECTOMY     added umbilical mesh   COLONOSCOPY WITH PROPOFOL N/A 03/27/2017   Procedure: COLONOSCOPY WITH PROPOFOL;  Surgeon: Manya Silvas, MD;  Location[facility] ENDOSCOPY;  Service: Endoscopy;  Laterality: N/A;   ESOPHAGOGASTRODUODENOSCOPY (EGD) WITH PROPOFOL N/A 03/27/2017   Procedure: ESOPHAGOGASTRODUODENOSCOPY (EGD) WITH PROPOFOL;  Surgeon: Manya Silvas, MD;  Location: [facility] ENDOSCOPY;  Service: Endoscopy;  Laterality: N/A;   hip fracture with rod placement Right    MASS EXCISION Left 05/24/2021   Procedure: EXCISION MASS;  Surgeon: Benjamine Sprague, DO;  Location: [facility] ORS;  Service: General;  Laterality: Left;  Prone postition   Patient Active Problem List   Diagnosis Date Noted   AAA (abdominal aortic aneurysm) without rupture ([place]) 01/02/2022   RLS (restless legs syndrome) 09/19/2020   Varicose veins of both lower extremities 09/19/2020   TIA (transient ischemic attack) 01/01/2020   History of TIA (transient ischemic attack) 12/27/2019   PVC's (premature  ventricular contractions) 12/27/2019   Pill dysphagia 03/17/2017   Hx of colonic polyps 12/05/2016   Irregular heart rate 12/05/2016   Hyperlipidemia 09/07/2015   Tobacco abuse 09/07/2015   Calculus, kidney 04/02/2015   Neuroendocrine tumor 06/22/2008   ONSET DATE: 05/19/2022  REFERRING DIAG: R26.9 (ICD-10-CM) - Gait difficulty R41.3 (ICD-10-CM) - Memory loss  THERAPY DIAG:  No diagnosis found.  Rationale for Evaluation and Treatment: Rehabilitation  SUBJECTIVE:  SUBJECTIVE STATEMENT:   ***did patient go to ED s/p fall for imaging?   Pt accompanied by: self  PERTINENT HISTORY:  66 year old male here for evaluation of gait and balance difficulty and memory loss. Symptoms have been present since 2021. He has developed numbness in his feet, gait and balance difficulty. Has difficulty in the shower when he closes eyes. He feels like he is drunk when he walks. He tends to lose his balance. He is fallen 10 times last few months.  PMH: Arthritis, hx of pancreas cancer, depression, hx of subdural hematoma , hx of hip fracture ~13 yrs ago   PAIN:  Are you having pain? Yes: NPRS scale: 3-4/10 Pain location: coccyx and L sided rib Pain description: Soreness where he fell   PRECAUTIONS: Fall  WEIGHT BEARING RESTRICTIONS: No  FALLS: Has patient fallen in last 6 months? Yes. Number of falls >10 falls   LIVING ENVIRONMENT: Lives with: lives with their spouse and and wife's mother Lives in: House/apartment Stairs: No Has following equipment at home: Single point cane and Grab bars - has suction cup grab bars   PLOF: Independent  PATIENT GOALS: Wants to improve balance.   OBJECTIVE:   DIAGNOSTIC FINDINGS:  03/27/20 MRI BRAIN 1. Positive for diffuse bilateral subdural hematoma versus  dural thickening, 2-3 mm in thickness throughout both the entire posterior fossa and bilateral supratentorial brain. Follow-up noncontrast Head CT might be able to confirm hyperdense blood products, although small SDH this size will frequently be occult by CT. Follow-up Post-contrast brain MRI would be valuable although a degree of dural thickening is typically seen with SDH. Otherwise CSF analysis might also be valuable. 2. No significant intracranial mass effect or other acute intracranial abnormality. 3. MRAs today are reported separately.  Brain and lumbar spine MRI scheduled for 06/09/22  IMPRESSION: This MRI of the lumbar spine without contrast shows the following: 1. At L4-L5, there is severe loss of disc height and other degenerative change causing moderately severe foraminal narrowing and moderate lateral recess stenosis.  There is potential for L4 nerve root compression.  There is no spinal stenosis. 2. At L5-S1, there are degenerative changes causing moderately severe foraminal narrowing and moderate lateral recess stenosis.  There is potential for L5 nerve root compression to either side.  There is no spinal stenosis. 3. Milder degenerative changes at the other lumbar levels do not lead to spinal stenosis or nerve root compression.   COGNITION: Overall cognitive status: Within functional limits for tasks assessed, however pt reporting some memory issues.    SENSATION: Light touch: Reports numbness/tingling in feet and in thighs, with light touch pt more impaired distally in RLE  Proprioception: WFL at bilat ankles   COORDINATION: Heel to shin: WFL bilat   LOWER EXTREMITY MMT:    MMT Right Eval Left Eval  Hip flexion 4/5 4-/5  Hip extension    Hip abduction 5/5 5/5  Hip adduction 5/5 5/5  Hip internal rotation    Hip external rotation    Knee flexion 5/5 5/5  Knee extension 4/5 4+/5  Ankle dorsiflexion 3-/5 3-/5  Ankle plantarflexion    Ankle inversion     Ankle eversion    (Blank rows = not tested)  Strength testing performed in sitting.   TRANSFERS: Assistive device utilized: None  Sit to stand: SBA Stand to sit: SBA From standard chair can perform without UE support, but reports at home has difficulty getting up from lower surfaces like the couch.   GAIT:  Gait pattern: decreased ankle dorsiflexion- Right, decreased ankle dorsiflexion- Left, Right steppage, Left steppage, narrow BOS, poor foot clearance- Right, and poor foot clearance- Left Distance walked: Clinic distances  Assistive device utilized: None Level of assistance: SBA and CGA Comments: Pt with bilat foot drop and unsteadiness with gait with some staggering requiring CGA. Pt with tendency to almost scissor at times and have a narrow BOS.    FUNCTIONAL TESTS:  5 times sit to stand: 15.63 seconds with no UE support  Timed up and go (TUG): 9 seconds, cog TUG: 14.85 seconds 10 meter walk test: 11.16 seconds = 2.93 ft/sec with no AD   Feet together EO on level ground: 30 seconds, on air ex: 30 seconds  M-CTSIB (with feet hip width distance)  Condition 1: Firm Surface, EO 30 Sec, Normal Sway  Condition 2: Firm Surface, EC 30 Sec, Mild Sway, pt shifting weight posteriorly on heels  Condition 3: Foam Surface, EO 30 Sec, Mild Sway  Condition 4: Foam Surface, EC 3 Sec, pt losing balance to the R.      TODAY'S TREATMENT: DATE: 07/12/22  Goals:    Neuro Re-ed:   VOR x1 with horizontal head turns, x10 VOR x1 with vertical head turns, x10 Seated alphabet 1x each LE; split alphabet into 3rds and pt was able to perform without significant increase in pain. Airex pad; sort alphabet into capital alphabet and lower case alphabet x9 minutes; one seated rest break   TherEx:  Ambulate 600 ft with CGA and cues for safety awareness without AD Seated hamstring curls with BTB, 2x10 each LE Seated hip adduction into rainbow physioball, 2x10 with 3 sec holds Seated LAQ with hip  adduction squeeze into physioball, 2x10 each LE    PATIENT EDUCATION:  Education details: HEP, indications of testing  Person educated: Patient Education method: Explanation Education comprehension: verbalized understanding  HOME EXERCISE PROGRAM: Access Code: [email or web address] URL: [email or web address] Date: 06/24/2022 Prepared by: Janna Arch  Exercises - Seated Ankle Alphabet  - 1 x daily - 7 x weekly - 2 sets - 10 reps - 5 hold - Seated Ankle Eversion with Resistance  - 1 x daily - 7 x weekly - 2 sets - 10 reps - 5 hold - Seated Ankle Dorsiflexion with Resistance  - 1 x daily - 7 x weekly - 2 sets - 10 reps - 5 hold  Access Code: [email or web address] URL: [email or web address] Date: 06/23/2022 Prepared by: Ricard Dillon  Exercises - Seated Gaze Stabilization with Head Rotation  - 1 x daily - 7 x weekly - 3 sets - 2 reps - 30 seconds hold  Access Code: [email or web address] URL: [email or web address] Date: 06/03/2022 Prepared by: Janna Arch Exercises - Seated Long Arc Quad  - 1 x daily - 5 x weekly - 3 sets - 10 reps - 5 hold - Seated Toe Raise  - 1 x daily - 5 x weekly - 3 sets - 10 reps - Sit to Stand Without Arm Support  - 1 x daily - 5 x weekly - 2 sets - 10 reps - Romberg Stance with Eyes Closed  - 1 x daily - 5 x weekly - 1 sets - 3 reps - 30 hold - Tandem Stance  - 1 x daily - 5 x weekly - 1 sets - 3 reps - 30 hold - Standing Single Leg Stance with Counter Support  - 1 x daily - 5 x weekly - 1 sets - 3 reps - 30 hold   GOALS: Goals reviewed  with patient? Yes  SHORT TERM GOALS: Target date: 06/26/2022  Pt will be independent with initial HEP in order to build upon functional gains made in therapy. Baseline: Goal status:   2.  Patient will increase Dynamic Gait Index to >19/24 as to demonstrate reduced falls risk and improved dynamic gait balance for better safety with community/home ambulation.  Baseline: 06/03/22: 12/24 Goal status: INITIAL  3.  Pt  will improve 5x sit<>stand to less than or equal to 14 sec to demonstrate improved functional strength and transfer efficiency.  Baseline: 15.63 seconds with no UE support Goal status: INITIAL  4.  Pt will ambulate at least 300' over level indoor/outdoor paved surfaces with supervision and LRAD in order to demo improved safety with mobility.  Baseline: No AD over level surfaces, supervision/CGA and unsteadiness.  Goal status: INITIAL  LONG TERM GOALS: Target date: 07/24/2022  Pt will be independent with final HEP in order to build upon functional gains made in therapy. Baseline:  Goal status: INITIAL  2.  Patient will increase dynamic gait index score to >19/24 as to demonstrate reduced fall risk and improved dynamic gait balance for better safety with community/home ambulation.  Baseline: 06/03/22: 12/24 Goal status: INITIAL  3.  Pt will improve condition 4 of mCTSIB to at least 12 seconds in order to demo improved vestibular input for balance.  Baseline: 3 seconds, pt losing balance to R.  Goal status: INITIAL  4.  Pt will improve gait speed with LRAD or appropriate braces/no AD to at least 3.3 ft/sec in order to demo improved community mobility.   Baseline: 11.16 seconds = 2.93 ft/sec with no AD  Goal status: INITIAL  5.  Pt will ambulate at least 500' outdoors over paved/grass surfaces with LRAD and supervision in order to demo improved community mobility.  Baseline:  Goal status: INITIAL   ASSESSMENT:  CLINICAL IMPRESSION:  ***  Pt will continue to benefit from skilled therapy to address remaining deficits in order to improve overall QoL and return to PLOF.      OBJECTIVE IMPAIRMENTS: Abnormal gait, decreased activity tolerance, decreased balance, decreased knowledge of use of DME, difficulty walking, decreased ROM, decreased strength, and impaired sensation.   ACTIVITY LIMITATIONS: bending, stairs, transfers, and locomotion level  PARTICIPATION LIMITATIONS: community  activity and yard work  PERSONAL FACTORS: Past/current experiences, Time since onset of injury/illness/exacerbation, 3+ comorbidities: Arthritis, hx of pancreas cancer, depression, hx of subdural hematoma , hx of hip fracture ~13 yrs ago , and frequent falls (>10 in the past 6 months)  are also affecting patient's functional outcome.   REHAB POTENTIAL: Good  CLINICAL DECISION MAKING: Evolving/moderate complexity  EVALUATION COMPLEXITY: Moderate  PLAN:  PT FREQUENCY: 2x/week  PT DURATION: 8 weeks  PLANNED INTERVENTIONS: Therapeutic exercises, Therapeutic activity, Neuromuscular re-education, Balance training, Gait training, Patient/Family education, Self Care, Stair training, Vestibular training, Orthotic/Fit training, DME instructions, and Re-evaluation  PLAN FOR NEXT SESSION:   VOR, balance, continue plan   [facility]  07/12/22, 2:08 PM

## 2022-07-15 ENCOUNTER — Ambulatory Visit: Payer: BC Managed Care – PPO

## 2022-07-17 ENCOUNTER — Ambulatory Visit: Payer: BC Managed Care – PPO

## 2022-07-17 DIAGNOSIS — R2681 Unsteadiness on feet: Secondary | ICD-10-CM

## 2022-07-17 DIAGNOSIS — M5459 Other low back pain: Secondary | ICD-10-CM

## 2022-07-17 DIAGNOSIS — R42 Dizziness and giddiness: Secondary | ICD-10-CM | POA: Diagnosis not present

## 2022-07-17 DIAGNOSIS — R262 Difficulty in walking, not elsewhere classified: Secondary | ICD-10-CM

## 2022-07-17 DIAGNOSIS — M6281 Muscle weakness (generalized): Secondary | ICD-10-CM

## 2022-07-17 NOTE — Therapy (Signed)
OUTPATIENT PHYSICAL THERAPY NEURO TREATMENT/Physical Therapy Progress Note   Dates of reporting period  05/28/2022   to   07/17/2022   Patient Name: Jared [name] MRN: [id number] DOB:25-Jun-1956, 66 y.o., male Today's Date: 07/17/2022  PCP:  [name], MD  REFERRING PROVIDER: [name], MD  PT End of Session - 07/17/22 1641     Visit Number 10    Number of Visits 17    Date for PT Re-Evaluation 07/28/22    Authorization Type [employer]    PT Start Time 4098    PT Stop Time 1634    PT Time Calculation (min) 41 min    Equipment Utilized During Treatment Gait belt    Activity Tolerance Patient tolerated treatment well;Other (comment)   pt required prolonged rest break (monitored by SPT and another PT) following session due to increased dizziness symptoms, improved with rest   Behavior During Therapy Impulsive;WFL for tasks assessed/performed               Past Medical History:  Diagnosis Date   Arthritis    Cancer ([place])    pancreas   Complication of anesthesia    tends to come out of anesthesia during procedure   Depression    Dysphagia    Elevated lipids    GERD (gastroesophageal reflux disease)    H/O blood clots    superficial blood clot in left leg below knee tx w/ eliquis x 3 mo   History of kidney stones    Kidney disease    Neuroendocrine tumor    Subdural hematoma ([facility])    hx of   Tobacco abuse    Unsteadiness    Past Surgical History:  Procedure Laterality Date   ABDOMINAL SURGERY     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   CHOLECYSTECTOMY     added umbilical mesh   COLONOSCOPY WITH PROPOFOL N/A 03/27/2017   Procedure: COLONOSCOPY WITH PROPOFOL;  Surgeon: Manya Silvas, MD;  Location: [facility] ENDOSCOPY;  Service: Endoscopy;  Laterality: N/A;   ESOPHAGOGASTRODUODENOSCOPY (EGD) WITH PROPOFOL N/A 03/27/2017   Procedure: ESOPHAGOGASTRODUODENOSCOPY (EGD) WITH PROPOFOL;  Surgeon: Manya Silvas, MD;  Location: [facility] ENDOSCOPY;   Service: Endoscopy;  Laterality: N/A;   hip fracture with rod placement Right    MASS EXCISION Left 05/24/2021   Procedure: EXCISION MASS;  Surgeon: Benjamine Sprague, DO;  Location: [facility] ORS;  Service: General;  Laterality: Left;  Prone postition   Patient Active Problem List   Diagnosis Date Noted   AAA (abdominal aortic aneurysm) without rupture ([place]) 01/02/2022   RLS (restless legs syndrome) 09/19/2020   Varicose veins of both lower extremities 09/19/2020   TIA (transient ischemic attack) 01/01/2020   History of TIA (transient ischemic attack) 12/27/2019   PVC's (premature ventricular contractions) 12/27/2019   Pill dysphagia 03/17/2017   Hx of colonic polyps 12/05/2016   Irregular heart rate 12/05/2016   Hyperlipidemia 09/07/2015   Tobacco abuse 09/07/2015   Calculus, kidney 04/02/2015   Neuroendocrine tumor 06/22/2008   ONSET DATE: 05/19/2022  REFERRING DIAG: R26.9 (ICD-10-CM) - Gait difficulty R41.3 (ICD-10-CM) - Memory loss  THERAPY DIAG:  Unsteadiness on feet  Other low back pain  Muscle weakness (generalized)  Difficulty in walking, not elsewhere classified  Rationale for Evaluation and Treatment: Rehabilitation  SUBJECTIVE:  SUBJECTIVE STATEMENT:  Pt reports some soreness still across low back s/p fall from the other week. He reports no other falls and that he has not followed up with his physician regarding recent fall and pain. PT further recommends follow-up with his physician should he not experience improvement in pain soon. Pt did report he had no pain earlier today which "surprised" him, but that it has since returned. He reports his dizziness has improved as well. Pt reports he is wearing a binder and that this is helping a lot with residual pain.  He reports he almost forgot to  use his cane this morning because he was feeling so good. He does report he can have random spasms felt in his L glute. It improves a little bit with walking. He has been using heat to help with his pain.   Pt accompanied by: self  PERTINENT HISTORY:  66 year old male here for evaluation of gait and balance difficulty and memory loss. Symptoms have been present since 2021. He has developed numbness in his feet, gait and balance difficulty. Has difficulty in the shower when he closes eyes. He feels like he is drunk when he walks. He tends to lose his balance. He is fallen 10 times last few months.  PMH: Arthritis, hx of pancreas cancer, depression, hx of subdural hematoma , hx of hip fracture ~13 yrs ago   PAIN:  Are you having pain? Yes: NPRS scale: 3-4/10 Pain location: coccyx and L sided rib Pain description: Soreness where he fell   PRECAUTIONS: Fall  WEIGHT BEARING RESTRICTIONS: No  FALLS: Has patient fallen in last 6 months? Yes. Number of falls >10 falls   LIVING ENVIRONMENT: Lives with: lives with their spouse and and wife's mother Lives in: House/apartment Stairs: No Has following equipment at home: Single point cane and Grab bars - has suction cup grab bars   PLOF: Independent  PATIENT GOALS: Wants to improve balance.   OBJECTIVE:   DIAGNOSTIC FINDINGS:  03/27/20 MRI BRAIN 1. Positive for diffuse bilateral subdural hematoma versus dural thickening, 2-3 mm in thickness throughout both the entire posterior fossa and bilateral supratentorial brain. Follow-up noncontrast Head CT might be able to confirm hyperdense blood products, although small SDH this size will frequently be occult by CT. Follow-up Post-contrast brain MRI would be valuable although a degree of dural thickening is typically seen with SDH. Otherwise CSF analysis might also be valuable. 2. No significant intracranial mass effect or other acute intracranial abnormality. 3. MRAs today are reported  separately.  Brain and lumbar spine MRI scheduled for 06/09/22  IMPRESSION: This MRI of the lumbar spine without contrast shows the following: 1. At L4-L5, there is severe loss of disc height and other degenerative change causing moderately severe foraminal narrowing and moderate lateral recess stenosis.  There is potential for L4 nerve root compression.  There is no spinal stenosis. 2. At L5-S1, there are degenerative changes causing moderately severe foraminal narrowing and moderate lateral recess stenosis.  There is potential for L5 nerve root compression to either side.  There is no spinal stenosis. 3. Milder degenerative changes at the other lumbar levels do not lead to spinal stenosis or nerve root compression.   COGNITION: Overall cognitive status: Within functional limits for tasks assessed, however pt reporting some memory issues.    SENSATION: Light touch: Reports numbness/tingling in feet and in thighs, with light touch pt more impaired distally in RLE  Proprioception: WFL at bilat ankles   COORDINATION: Heel to shin:  WFL bilat   LOWER EXTREMITY MMT:    MMT Right Eval Left Eval  Hip flexion 4/5 4-/5  Hip extension    Hip abduction 5/5 5/5  Hip adduction 5/5 5/5  Hip internal rotation    Hip external rotation    Knee flexion 5/5 5/5  Knee extension 4/5 4+/5  Ankle dorsiflexion 3-/5 3-/5  Ankle plantarflexion    Ankle inversion    Ankle eversion    (Blank rows = not tested)  Strength testing performed in sitting.   TRANSFERS: Assistive device utilized: None  Sit to stand: SBA Stand to sit: SBA From standard chair can perform without UE support, but reports at home has difficulty getting up from lower surfaces like the couch.   GAIT: Gait pattern: decreased ankle dorsiflexion- Right, decreased ankle dorsiflexion- Left, Right steppage, Left steppage, narrow BOS, poor foot clearance- Right, and poor foot clearance- Left Distance walked: Clinic distances   Assistive device utilized: None Level of assistance: SBA and CGA Comments: Pt with bilat foot drop and unsteadiness with gait with some staggering requiring CGA. Pt with tendency to almost scissor at times and have a narrow BOS.    FUNCTIONAL TESTS:  5 times sit to stand: 15.63 seconds with no UE support  Timed up and go (TUG): 9 seconds, cog TUG: 14.85 seconds 10 meter walk test: 11.16 seconds = 2.93 ft/sec with no AD   Feet together EO on level ground: 30 seconds, on air ex: 30 seconds  M-CTSIB (with feet hip width distance)  Condition 1: Firm Surface, EO 30 Sec, Normal Sway  Condition 2: Firm Surface, EC 30 Sec, Mild Sway, pt shifting weight posteriorly on heels  Condition 3: Foam Surface, EO 30 Sec, Mild Sway  Condition 4: Foam Surface, EC 3 Sec, pt losing balance to the R.      TODAY'S TREATMENT: DATE: 07/17/22   TherAct: Goals retested for progress note. Please refer to goal section below.  Heat donned across low back (pt reports nothing on low back to prohibit use of heat), he reports heat feels good. Heat donned between tests for 2-5. min at a time. No adverse reaction to treatment.  M-CTSIB: able to maintain all 4 conditions - increased sway conditions 2 and 4 but no LOB  PT ambulate with pt up to lobby at end of session. While pt continues to exhibit antalgic gait, he is steady with use of cane.   PATIENT EDUCATION:  Education details: testing, indications of test performance, plan, encourages follow-up with physician s/p fall Person educated: Patient Education method: Explanation, Demonstration, and Verbal cues Education comprehension: verbalized understanding, returned demonstration, and needs further education  HOME EXERCISE PROGRAM: Access Code: [email or web address] URL: [email or web address] Date: 06/24/2022 Prepared by: Janna Arch  Exercises - Seated Ankle Alphabet  - 1 x daily - 7 x weekly - 2 sets - 10 reps - 5 hold - Seated Ankle Eversion with  Resistance  - 1 x daily - 7 x weekly - 2 sets - 10 reps - 5 hold - Seated Ankle Dorsiflexion with Resistance  - 1 x daily - 7 x weekly - 2 sets - 10 reps - 5 hold  Access Code: [email or web address] URL: [email or web address] Date: 06/23/2022 Prepared by: Ricard Dillon  Exercises - Seated Gaze Stabilization with Head Rotation  - 1 x daily - 7 x weekly - 3 sets - 2 reps - 30 seconds hold  Access Code: [email or web address] URL: [email or web address] Date: 06/03/2022 Prepared by: [place]  Quad  - 1 x daily - 5 x weekly - 3 sets - 10 reps - 5 hold - Seated Toe Raise  - 1 x daily - 5 x weekly - 3 sets - 10 reps - Sit to Stand Without Arm Support  - 1 x daily - 5 x weekly - 2 sets - 10 reps - Romberg Stance with Eyes Closed  - 1 x daily - 5 x weekly - 1 sets - 3 reps - 30 hold - Tandem Stance  - 1 x daily - 5 x weekly - 1 sets - 3 reps - 30 hold - Standing Single Leg Stance with Counter Support  - 1 x daily - 5 x weekly - 1 sets - 3 reps - 30 hold   GOALS: Goals reviewed with patient? Yes  SHORT TERM GOALS: Target date: 06/26/2022  Pt will be independent with initial HEP in order to build upon functional gains made in therapy. Baseline: 12/28: Pt reports "kind of on my own I have gone back to the beginning" regarding resuming his initial HEP and that he feels confident/indep performing it Goal status: ONGOING  2.  Patient will increase Dynamic Gait Index to >19/24 as to demonstrate reduced falls risk and improved dynamic gait balance for better safety with community/home ambulation.  Baseline: 06/03/22: 12/24; 07/17/22: deferred Goal status: IN PROGRESS  3.  Pt will improve 5x sit<>stand to less than or equal to 14 sec to demonstrate improved functional strength and transfer efficiency.  Baseline: 15.63 seconds with no UE support; 12/28:  17 seconds hands free Goal status: IN PROGRESS  4.  Pt will ambulate at least 300' over level indoor/outdoor paved surfaces  with supervision and LRAD in order to demo improved safety with mobility.  Baseline: No AD over level surfaces, supervision/CGA and unsteadiness.; 12/28: pt ambulates 149 ft with SPC, CGA prior to requiring rest break due to L glute spasm, performed over even surface Goal status: INITIAL  LONG TERM GOALS: Target date: 07/24/2022  Pt will be independent with final HEP in order to build upon functional gains made in therapy. Baseline: 07/17/22: pt reports performing initial HEP at the moment Goal status: IN PROGRESS  2.  Patient will increase dynamic gait index score to >19/24 as to demonstrate reduced fall risk and improved dynamic gait balance for better safety with community/home ambulation.  Baseline: 06/03/22: 12/24; 07/17/22: deferred Goal status: IN PROGRESS  3.  Pt will improve condition 4 of mCTSIB to at least 12 seconds in order to demo improved vestibular input for balance.  Baseline: 3 seconds, pt losing balance to R.; 12/28: pt able to maintain all 4 conditions without LOB, but does exhibit increased sway in condition 1 and 2 Goal status: MET  4.  Pt will improve gait speed with LRAD or appropriate braces/no AD to at least 3.3 ft/sec in order to demo improved community mobility.   Baseline: 11.16 seconds = 2.93 ft/sec with no AD; 12/28: 2.2 ft/sec with SPC (using due to unsteadiness s/p recent falls) Goal status: IN PROGRESS  5.  Pt will ambulate at least 500' outdoors over paved/grass surfaces with LRAD and supervision in order to demo improved community mobility.  Baseline: 12/28: pt reports he does not think he could walk over uneven surfaces due to pain and unsteadiness Goal status: IN PROGRESS   ASSESSMENT:  CLINICAL IMPRESSION: Retesting initiated for progress note. Pt with slight decrease in 5xSTS score today compared to prior assessment, likely limited by pain due  to recent falls. Gait testing indicates pt gait speed still limited over smooth and uneven surfaces due to  impaired balance and pain. Pt did show progress on M-CTSIB, however, by completing all 4 conditions without LOB. DGI testing deferred to future session. The pt will continue to benefit from skilled therapy to address remaining deficits in order to improve overall QoL and return to PLOF.      OBJECTIVE IMPAIRMENTS: Abnormal gait, decreased activity tolerance, decreased balance, decreased knowledge of use of DME, difficulty walking, decreased ROM, decreased strength, and impaired sensation.   ACTIVITY LIMITATIONS: bending, stairs, transfers, and locomotion level  PARTICIPATION LIMITATIONS: community activity and yard work  PERSONAL FACTORS: Past/current experiences, Time since onset of injury/illness/exacerbation, 3+ comorbidities: Arthritis, hx of pancreas cancer, depression, hx of subdural hematoma , hx of hip fracture ~13 yrs ago , and frequent falls (>10 in the past 6 months)  are also affecting patient's functional outcome.   REHAB POTENTIAL: Good  CLINICAL DECISION MAKING: Evolving/moderate complexity  EVALUATION COMPLEXITY: Moderate  PLAN:  PT FREQUENCY: 2x/week  PT DURATION: 8 weeks  PLANNED INTERVENTIONS: Therapeutic exercises, Therapeutic activity, Neuromuscular re-education, Balance training, Gait training, Patient/Family education, Self Care, Stair training, Vestibular training, Orthotic/Fit training, DME instructions, and Re-evaluation  PLAN FOR NEXT SESSION:   VOR, balance, continue plan Ricard Dillon PT, DPT  Physical Therapist- [facility]  07/17/22, 4:54 PM

## 2022-07-22 ENCOUNTER — Encounter: Payer: Self-pay | Admitting: Physical Therapy

## 2022-07-22 ENCOUNTER — Ambulatory Visit: Payer: BC Managed Care – PPO | Attending: Diagnostic Neuroimaging | Admitting: Physical Therapy

## 2022-07-22 DIAGNOSIS — R262 Difficulty in walking, not elsewhere classified: Secondary | ICD-10-CM | POA: Insufficient documentation

## 2022-07-22 DIAGNOSIS — R2689 Other abnormalities of gait and mobility: Secondary | ICD-10-CM | POA: Insufficient documentation

## 2022-07-22 DIAGNOSIS — R296 Repeated falls: Secondary | ICD-10-CM | POA: Diagnosis present

## 2022-07-22 DIAGNOSIS — M5459 Other low back pain: Secondary | ICD-10-CM | POA: Diagnosis present

## 2022-07-22 DIAGNOSIS — M6281 Muscle weakness (generalized): Secondary | ICD-10-CM | POA: Diagnosis present

## 2022-07-22 DIAGNOSIS — R42 Dizziness and giddiness: Secondary | ICD-10-CM | POA: Insufficient documentation

## 2022-07-22 DIAGNOSIS — R2681 Unsteadiness on feet: Secondary | ICD-10-CM | POA: Diagnosis present

## 2022-07-22 NOTE — Therapy (Signed)
OUTPATIENT PHYSICAL THERAPY NEURO TREATMENT   Patient Name: Jared [name] MRN: [id number] DOB:[birth year], 67 y.o., male Today's Date: 07/22/2022  PCP:  [name], MD  REFERRING PROVIDER: [name], MD      Past Medical History:  Diagnosis Date   Arthritis    Cancer [facility])    pancreas   Complication of anesthesia    tends to come out of anesthesia during procedure   Depression    Dysphagia    Elevated lipids    GERD (gastroesophageal reflux disease)    H/O blood clots    superficial blood clot in left leg below knee tx w/ eliquis x 3 mo   History of kidney stones    Kidney disease    Neuroendocrine tumor    Subdural hematoma (HCC)    hx of   Tobacco abuse    Unsteadiness    Past Surgical History:  Procedure Laterality Date   ABDOMINAL SURGERY     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   CHOLECYSTECTOMY     added umbilical mesh   COLONOSCOPY WITH PROPOFOL N/A 03/27/2017   Procedure: COLONOSCOPY WITH PROPOFOL;  Surgeon: Manya Silvas, MD;  Location: [facility] ENDOSCOPY;  Service: Endoscopy;  Laterality: N/A;   ESOPHAGOGASTRODUODENOSCOPY (EGD) WITH PROPOFOL N/A 03/27/2017   Procedure: ESOPHAGOGASTRODUODENOSCOPY (EGD) WITH PROPOFOL;  Surgeon: Manya Silvas, MD;  Location: [facility] ENDOSCOPY;  Service: Endoscopy;  Laterality: N/A;   hip fracture with rod placement Right    MASS EXCISION Left 05/24/2021   Procedure: EXCISION MASS;  Surgeon: Benjamine Sprague, DO;  Location: [facility] ORS;  Service: General;  Laterality: Left;  Prone postition   Patient Active Problem List   Diagnosis Date Noted   AAA (abdominal aortic aneurysm) without rupture ([place]) 01/02/2022   RLS (restless legs syndrome) 09/19/2020   Varicose veins of both lower extremities 09/19/2020   TIA (transient ischemic attack) 01/01/2020   History of TIA (transient ischemic attack) 12/27/2019   PVC's (premature ventricular contractions) 12/27/2019   Pill dysphagia 03/17/2017   Hx of colonic  polyps 12/05/2016   Irregular heart rate 12/05/2016   Hyperlipidemia 09/07/2015   Tobacco abuse 09/07/2015   Calculus, kidney 04/02/2015   Neuroendocrine tumor 06/22/2008   ONSET DATE: 05/19/2022  REFERRING DIAG: R26.9 (ICD-10-CM) - Gait difficulty R41.3 (ICD-10-CM) - Memory loss  THERAPY DIAG:  Other low back pain  Muscle weakness (generalized)  Unsteadiness on feet  Difficulty in walking, not elsewhere classified  Rationale for Evaluation and Treatment: Rehabilitation  SUBJECTIVE:  SUBJECTIVE STATEMENT:  Pt reports his glute muscle is still pulling and causing pain. He reports he would like a exercise routing that he can do in the morning to get his day started. He would also like consolidated Hep for rest of his exercises.   Pt accompanied by: self  PERTINENT HISTORY:  68 year old male here for evaluation of gait and balance difficulty and memory loss. Symptoms have been present since 2021. He has developed numbness in his feet, gait and balance difficulty. Has difficulty in the shower when he closes eyes. He feels like he is drunk when he walks. He tends to lose his balance. He is fallen 10 times last few months.  PMH: Arthritis, hx of pancreas cancer, depression, hx of subdural hematoma , hx of hip fracture ~13 yrs ago   PAIN:  Are you having pain? Yes: NPRS scale: 3-4/10 Pain location: coccyx and L sided rib Pain description: Soreness where he fell   PRECAUTIONS: Fall  WEIGHT BEARING RESTRICTIONS: No  FALLS: Has patient fallen in last 6 months? Yes. Number of falls >10 falls   LIVING ENVIRONMENT: Lives with: lives with their spouse and and wife's mother Lives in: House/apartment Stairs: No Has following equipment at home: Single point cane and Grab bars - has suction cup grab  bars   PLOF: Independent  PATIENT GOALS: Wants to improve balance.   OBJECTIVE:   DIAGNOSTIC FINDINGS:  03/27/20 MRI BRAIN 1. Positive for diffuse bilateral subdural hematoma versus dural thickening, 2-3 mm in thickness throughout both the entire posterior fossa and bilateral supratentorial brain. Follow-up noncontrast Head CT might be able to confirm hyperdense blood products, although small SDH this size will frequently be occult by CT. Follow-up Post-contrast brain MRI would be valuable although a degree of dural thickening is typically seen with SDH. Otherwise CSF analysis might also be valuable. 2. No significant intracranial mass effect or other acute intracranial abnormality. 3. MRAs today are reported separately.  Brain and lumbar spine MRI scheduled for 06/09/22  IMPRESSION: This MRI of the lumbar spine without contrast shows the following: 1. At L4-L5, there is severe loss of disc height and other degenerative change causing moderately severe foraminal narrowing and moderate lateral recess stenosis.  There is potential for L4 nerve root compression.  There is no spinal stenosis. 2. At L5-S1, there are degenerative changes causing moderately severe foraminal narrowing and moderate lateral recess stenosis.  There is potential for L5 nerve root compression to either side.  There is no spinal stenosis. 3. Milder degenerative changes at the other lumbar levels do not lead to spinal stenosis or nerve root compression.   COGNITION: Overall cognitive status: Within functional limits for tasks assessed, however pt reporting some memory issues.    SENSATION: Light touch: Reports numbness/tingling in feet and in thighs, with light touch pt more impaired distally in RLE  Proprioception: WFL at bilat ankles   COORDINATION: Heel to shin: WFL bilat   LOWER EXTREMITY MMT:    MMT Right Eval Left Eval  Hip flexion 4/5 4-/5  Hip extension    Hip abduction 5/5 5/5  Hip adduction  5/5 5/5  Hip internal rotation    Hip external rotation    Knee flexion 5/5 5/5  Knee extension 4/5 4+/5  Ankle dorsiflexion 3-/5 3-/5  Ankle plantarflexion    Ankle inversion    Ankle eversion    (Blank rows = not tested)  Strength testing performed in sitting.   TRANSFERS: Assistive device utilized: None  Sit  to stand: SBA Stand to sit: SBA From standard chair can perform without UE support, but reports at home has difficulty getting up from lower surfaces like the couch.   GAIT: Gait pattern: decreased ankle dorsiflexion- Right, decreased ankle dorsiflexion- Left, Right steppage, Left steppage, narrow BOS, poor foot clearance- Right, and poor foot clearance- Left Distance walked: Clinic distances  Assistive device utilized: None Level of assistance: SBA and CGA Comments: Pt with bilat foot drop and unsteadiness with gait with some staggering requiring CGA. Pt with tendency to almost scissor at times and have a narrow BOS.    FUNCTIONAL TESTS:  5 times sit to stand: 15.63 seconds with no UE support  Timed up and go (TUG): 9 seconds, cog TUG: 14.85 seconds 10 meter walk test: 11.16 seconds = 2.93 ft/sec with no AD   Feet together EO on level ground: 30 seconds, on air ex: 30 seconds  M-CTSIB (with feet hip width distance)  Condition 1: Firm Surface, EO 30 Sec, Normal Sway  Condition 2: Firm Surface, EC 30 Sec, Mild Sway, pt shifting weight posteriorly on heels  Condition 3: Foam Surface, EO 30 Sec, Mild Sway  Condition 4: Foam Surface, EC 3 Sec, pt losing balance to the R.      TODAY'S TREATMENT: DATE: 07/22/22  [facility] PT Assessment - 07/22/22 0001       Standardized Balance Assessment   Standardized Balance Assessment Dynamic Gait Index      Dynamic Gait Index   Level Surface Mild Impairment    Change in Gait Speed Mild Impairment    Gait with Horizontal Head Turns Mild Impairment    Gait with Vertical Head Turns Mild Impairment    Gait and Pivot Turn Mild  Impairment    Step Over Obstacle Moderate Impairment    Step Around Obstacles Mild Impairment    Steps Mild Impairment    Total Score 15              TE:   Exercises - Supine Single Knee to Chest Stretch  - 1 x daily - 7 x weekly - 3 sets - 10 reps - 30 seconds  hold - Supine Lower Trunk Rotation  - 1 x daily - 7 x weekly - 1 sets - 10 reps - 5 sec hold - Supine Bridge with Resistance Band  - 1 x daily - 7 x weekly - 2 sets - 10 reps - Clamshell  - 1 x daily - 7 x weekly - 2 sets - 10 reps    PATIENT EDUCATION:  Education details: testing, indications of test performance, plan, encourages follow-up with physician s/p fall Person educated: Patient Education method: Explanation, Demonstration, and Verbal cues Education comprehension: verbalized understanding, returned demonstration, and needs further education  HOME EXERCISE PROGRAM: Morning/ In Bed HEP Access Code: [email or web address] URL: [email or web address] Date: 07/22/2022 Prepared by: Rivka Barbara  Exercises - Supine Single Knee to Chest Stretch  - 1 x daily - 7 x weekly - 3 sets - 10 reps - 30 seconds  hold - Supine Lower Trunk Rotation  - 1 x daily - 7 x weekly - 1 sets - 10 reps - 5 sec hold - Supine Bridge with Resistance Band  - 1 x daily - 7 x weekly - 2 sets - 10 reps - Clamshell  - 1 x daily - 7 x weekly - 2 sets - 10 reps   HEP rest of the day:   Access Code: [email or web address] URL: [email or web address] Date: 07/22/2022 Prepared  by: Rivka Barbara  Exercises - Seated Long Arc Quad  - 1 x daily - 5 x weekly - 3 sets - 10 reps - 5 hold - Sit to Stand Without Arm Support  - 1 x daily - 5 x weekly - 2 sets - 8 reps - Romberg Stance with Eyes Closed  - 1 x daily - 5 x weekly - 1 sets - 3 reps - 30 hold - Tandem Stance  - 1 x daily - 5 x weekly - 1 sets - 3 reps - 30 hold - Standing Single Leg Stance with Counter Support  - 1 x daily - 5 x weekly - 1 sets - 3 reps - 30 hold - Seated Gaze  Stabilization with Head Nod  - 1 x daily - 7 x weekly - 3 sets - 2 reps - 30 second hold - Seated Ankle Alphabet  - 1 x daily - 7 x weekly - 3 sets - Ankle Eversion with Resistance  - 1 x daily - 7 x weekly - 2 sets - 10 reps - 5 sec  hold - Ankle Dorsiflexion with Resistance  - 1 x daily - 7 x weekly - 2 sets - 10 reps - 5 sec hold  GOALS: Goals reviewed with patient? Yes  SHORT TERM GOALS: Target date: 06/26/2022  Pt will be independent with initial HEP in order to build upon functional gains made in therapy. Baseline: 12/28: Pt reports "kind of on my own I have gone back to the beginning" regarding resuming his initial HEP and that he feels confident/indep performing it Goal status: ONGOING  2.  Patient will increase Dynamic Gait Index to >19/24 as to demonstrate reduced falls risk and improved dynamic gait balance for better safety with community/home ambulation.  Baseline: 06/03/22: 12/24; 07/17/22: deferred Goal status: IN PROGRESS  3.  Pt will improve 5x sit<>stand to less than or equal to 14 sec to demonstrate improved functional strength and transfer efficiency.  Baseline: 15.63 seconds with no UE support; 12/28:  17 seconds hands free Goal status: IN PROGRESS  4.  Pt will ambulate at least 300' over level indoor/outdoor paved surfaces with supervision and LRAD in order to demo improved safety with mobility.  Baseline: No AD over level surfaces, supervision/CGA and unsteadiness.; 12/28: pt ambulates 149 ft with SPC, CGA prior to requiring rest break due to L glute spasm, performed over even surface Goal status: INITIAL  LONG TERM GOALS: Target date: 07/24/2022  Pt will be independent with final HEP in order to build upon functional gains made in therapy. Baseline: 07/17/22: pt reports performing initial HEP at the moment Goal status: IN PROGRESS  2.  Patient will increase dynamic gait index score to >19/24 as to demonstrate reduced fall risk and improved dynamic gait balance for  better safety with community/home ambulation.  Baseline: 06/03/22: 12/24; 07/17/22: deferred 07/22/22: 15/24 Goal status: IN PROGRESS  3.  Pt will improve condition 4 of mCTSIB to at least 12 seconds in order to demo improved vestibular input for balance.  Baseline: 3 seconds, pt losing balance to R.; 12/28: pt able to maintain all 4 conditions without LOB, but does exhibit increased sway in condition 1 and 2 Goal status: MET  4.  Pt will improve gait speed with LRAD or appropriate braces/no AD to at least 3.3 ft/sec in order to demo improved community mobility.   Baseline: 11.16 seconds = 2.93 ft/sec with no AD; 12/28: 2.2 ft/sec with SPC (using due to unsteadiness  s/p recent falls) Goal status: IN PROGRESS  5.  Pt will ambulate at least 500' outdoors over paved/grass surfaces with LRAD and supervision in order to demo improved community mobility.  Baseline: 12/28: pt reports he does not think he could walk over uneven surfaces due to pain and unsteadiness Goal status: IN PROGRESS   ASSESSMENT:  CLINICAL IMPRESSION: Pt presents with excellent motivation for completion of PT interventions. Pt shows progress with DGI test today indicating improvement in balance but still had instability and pain in his L hip with various aspects of testing. Pt was provided with updated HEP this date to consolidate his HEP and to provide him with morning routie for hip and back pain prior to the day. Pt will continue to benefit from skilled physical therapy intervention to address impairments, improve QOL, and attain therapy goals.      OBJECTIVE IMPAIRMENTS: Abnormal gait, decreased activity tolerance, decreased balance, decreased knowledge of use of DME, difficulty walking, decreased ROM, decreased strength, and impaired sensation.   ACTIVITY LIMITATIONS: bending, stairs, transfers, and locomotion level  PARTICIPATION LIMITATIONS: community activity and yard work  PERSONAL FACTORS: Past/current  experiences, Time since onset of injury/illness/exacerbation, 3+ comorbidities: Arthritis, hx of pancreas cancer, depression, hx of subdural hematoma , hx of hip fracture ~13 yrs ago , and frequent falls (>10 in the past 6 months)  are also affecting patient's functional outcome.   REHAB POTENTIAL: Good  CLINICAL DECISION MAKING: Evolving/moderate complexity  EVALUATION COMPLEXITY: Moderate  PLAN:  PT FREQUENCY: 2x/week  PT DURATION: 8 weeks  PLANNED INTERVENTIONS: Therapeutic exercises, Therapeutic activity, Neuromuscular re-education, Balance training, Gait training, Patient/Family education, Self Care, Stair training, Vestibular training, Orthotic/Fit training, DME instructions, and Re-evaluation  PLAN FOR NEXT SESSION:   VOR, balance, continue plan  Particia Lather PT  07/22/22, 3:28 PM

## 2022-07-28 ENCOUNTER — Ambulatory Visit: Payer: BC Managed Care – PPO

## 2022-07-28 DIAGNOSIS — R2681 Unsteadiness on feet: Secondary | ICD-10-CM

## 2022-07-28 DIAGNOSIS — M5459 Other low back pain: Secondary | ICD-10-CM

## 2022-07-28 DIAGNOSIS — R262 Difficulty in walking, not elsewhere classified: Secondary | ICD-10-CM

## 2022-07-28 DIAGNOSIS — M6281 Muscle weakness (generalized): Secondary | ICD-10-CM

## 2022-07-28 NOTE — Therapy (Signed)
OUTPATIENT PHYSICAL THERAPY NEURO TREATMENT/RECERT   Patient Name: Jared [name] MRN: [id number] DOB:04/25/1956, 67 y.o., male Today's Date: 07/28/2022  PCP:  [name], MD  REFERRING PROVIDER: [name], MD  PT End of Session - 07/28/22 1616     Visit Number 12    Number of Visits 20    Date for PT Re-Evaluation 09/22/22    Authorization Type [employer]    Progress Note Due on Visit 20    PT Start Time 1600    PT Stop Time 8144    PT Time Calculation (min) 44 min    Equipment Utilized During Treatment Gait belt    Activity Tolerance Patient tolerated treatment well;Other (comment)   pt required prolonged rest break (monitored by SPT and another PT) following session due to increased dizziness symptoms, improved with rest   Behavior During Therapy Impulsive;WFL for tasks assessed/performed                Past Medical History:  Diagnosis Date   Arthritis    Cancer ([place])    pancreas   Complication of anesthesia    tends to come out of anesthesia during procedure   Depression    Dysphagia    Elevated lipids    GERD (gastroesophageal reflux disease)    H/O blood clots    superficial blood clot in left leg below knee tx w/ eliquis x 3 mo   History of kidney stones    Kidney disease    Neuroendocrine tumor    Subdural hematoma ([facility])    hx of   Tobacco abuse    Unsteadiness    Past Surgical History:  Procedure Laterality Date   ABDOMINAL SURGERY     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   CHOLECYSTECTOMY     added umbilical mesh   COLONOSCOPY WITH PROPOFOL N/A 03/27/2017   Procedure: COLONOSCOPY WITH PROPOFOL;  Surgeon: Manya Silvas, MD;  [facility];  Service: Endoscopy;  Laterality: N/A;   ESOPHAGOGASTRODUODENOSCOPY (EGD) WITH PROPOFOL N/A 03/27/2017   Procedure: ESOPHAGOGASTRODUODENOSCOPY (EGD) WITH PROPOFOL;  Surgeon: Manya Silvas, MD;  Location: [facility] ENDOSCOPY;  Service: Endoscopy;  Laterality: N/A;   hip fracture  with rod placement Right    MASS EXCISION Left 05/24/2021   Procedure: EXCISION MASS;  Surgeon: Benjamine Sprague, DO;  Location: [facility] ORS;  Service: General;  Laterality: Left;  Prone postition   Patient Active Problem List   Diagnosis Date Noted   AAA (abdominal aortic aneurysm) without rupture ([place]) 01/02/2022   RLS (restless legs syndrome) 09/19/2020   Varicose veins of both lower extremities 09/19/2020   TIA (transient ischemic attack) 01/01/2020   History of TIA (transient ischemic attack) 12/27/2019   PVC's (premature ventricular contractions) 12/27/2019   Pill dysphagia 03/17/2017   Hx of colonic polyps 12/05/2016   Irregular heart rate 12/05/2016   Hyperlipidemia 09/07/2015   Tobacco abuse 09/07/2015   Calculus, kidney 04/02/2015   Neuroendocrine tumor 06/22/2008   ONSET DATE: 05/19/2022  REFERRING DIAG: R26.9 (ICD-10-CM) - Gait difficulty R41.3 (ICD-10-CM) - Memory loss  THERAPY DIAG:  Other low back pain  Muscle weakness (generalized)  Unsteadiness on feet  Difficulty in walking, not elsewhere classified  Rationale for Evaluation and Treatment: Rehabilitation  SUBJECTIVE:  SUBJECTIVE STATEMENT:  Reports past two weeks his body has been feeling better with less pain.   Pt accompanied by: self  PERTINENT HISTORY:  67 year old male here for evaluation of gait and balance difficulty and memory loss. Symptoms have been present since 2021. He has developed numbness in his feet, gait and balance difficulty. Has difficulty in the shower when he closes eyes. He feels like he is drunk when he walks. He tends to lose his balance. He is fallen 10 times last few months.  PMH: Arthritis, hx of pancreas cancer, depression, hx of subdural hematoma , hx of hip fracture ~13 yrs ago   PAIN:  Are  you having pain? Yes: NPRS scale: 3-4/10 Pain location: coccyx and L sided rib Pain description: Soreness where he fell   PRECAUTIONS: Fall  WEIGHT BEARING RESTRICTIONS: No  FALLS: Has patient fallen in last 6 months? Yes. Number of falls >10 falls   LIVING ENVIRONMENT: Lives with: lives with their spouse and and wife's mother Lives in: House/apartment Stairs: No Has following equipment at home: Single point cane and Grab bars - has suction cup grab bars   PLOF: Independent  PATIENT GOALS: Wants to improve balance.   OBJECTIVE:   DIAGNOSTIC FINDINGS:  03/27/20 MRI BRAIN 1. Positive for diffuse bilateral subdural hematoma versus dural thickening, 2-3 mm in thickness throughout both the entire posterior fossa and bilateral supratentorial brain. Follow-up noncontrast Head CT might be able to confirm hyperdense blood products, although small SDH this size will frequently be occult by CT. Follow-up Post-contrast brain MRI would be valuable although a degree of dural thickening is typically seen with SDH. Otherwise CSF analysis might also be valuable. 2. No significant intracranial mass effect or other acute intracranial abnormality. 3. MRAs today are reported separately.  Brain and lumbar spine MRI scheduled for 06/09/22  IMPRESSION: This MRI of the lumbar spine without contrast shows the following: 1. At L4-L5, there is severe loss of disc height and other degenerative change causing moderately severe foraminal narrowing and moderate lateral recess stenosis.  There is potential for L4 nerve root compression.  There is no spinal stenosis. 2. At L5-S1, there are degenerative changes causing moderately severe foraminal narrowing and moderate lateral recess stenosis.  There is potential for L5 nerve root compression to either side.  There is no spinal stenosis. 3. Milder degenerative changes at the other lumbar levels do not lead to spinal stenosis or nerve root compression.    COGNITION: Overall cognitive status: Within functional limits for tasks assessed, however pt reporting some memory issues.    SENSATION: Light touch: Reports numbness/tingling in feet and in thighs, with light touch pt more impaired distally in RLE  Proprioception: WFL at bilat ankles   COORDINATION: Heel to shin: WFL bilat   LOWER EXTREMITY MMT:    MMT Right Eval Left Eval  Hip flexion 4/5 4-/5  Hip extension    Hip abduction 5/5 5/5  Hip adduction 5/5 5/5  Hip internal rotation    Hip external rotation    Knee flexion 5/5 5/5  Knee extension 4/5 4+/5  Ankle dorsiflexion 3-/5 3-/5  Ankle plantarflexion    Ankle inversion    Ankle eversion    (Blank rows = not tested)  Strength testing performed in sitting.   TRANSFERS: Assistive device utilized: None  Sit to stand: SBA Stand to sit: SBA From standard chair can perform without UE support, but reports at home has difficulty getting up from lower surfaces like the couch.  GAIT: Gait pattern: decreased ankle dorsiflexion- Right, decreased ankle dorsiflexion- Left, Right steppage, Left steppage, narrow BOS, poor foot clearance- Right, and poor foot clearance- Left Distance walked: Clinic distances  Assistive device utilized: None Level of assistance: SBA and CGA Comments: Pt with bilat foot drop and unsteadiness with gait with some staggering requiring CGA. Pt with tendency to almost scissor at times and have a narrow BOS.    FUNCTIONAL TESTS:  5 times sit to stand: 15.63 seconds with no UE support  Timed up and go (TUG): 9 seconds, cog TUG: 14.85 seconds 10 meter walk test: 11.16 seconds = 2.93 ft/sec with no AD   Feet together EO on level ground: 30 seconds, on air ex: 30 seconds  M-CTSIB (with feet hip width distance)  Condition 1: Firm Surface, EO 30 Sec, Normal Sway  Condition 2: Firm Surface, EC 30 Sec, Mild Sway, pt shifting weight posteriorly on heels  Condition 3: Foam Surface, EO 30 Sec, Mild Sway   Condition 4: Foam Surface, EC 3 Sec, pt losing balance to the R.      TODAY'S TREATMENT: DATE: 07/28/22  Neuro Re-ed:    VOR x1 with horizontal head turns, x10 standing VOR x1 with vertical head turns, x10 standing Airex balance beam:  -lateral stepping 4x length of // bars  -tandem walk 4x length of // bar with UE support -chest press with PVC pipe 15x  Airex pad: throw ball at target 15x   TherEx  Single knee to chest 30 seconds LE rotation 10x with 5 second holds Posterior pelvic tilt with adduction ball squeeze 15x  TrA activation pressing into green swiss ball 10x    PATIENT EDUCATION:  Education details: testing, indications of test performance, plan, encourages follow-up with physician s/p fall Person educated: Patient Education method: Explanation, Demonstration, and Verbal cues Education comprehension: verbalized understanding, returned demonstration, and needs further education  HOME EXERCISE PROGRAM: Morning/ In Bed HEP Access Code: [email or web address] URL: [email or web address] Date: 07/22/2022 Prepared by: Rivka Barbara  Exercises - Supine Single Knee to Chest Stretch  - 1 x daily - 7 x weekly - 3 sets - 10 reps - 30 seconds  hold - Supine Lower Trunk Rotation  - 1 x daily - 7 x weekly - 1 sets - 10 reps - 5 sec hold - Supine Bridge with Resistance Band  - 1 x daily - 7 x weekly - 2 sets - 10 reps - Clamshell  - 1 x daily - 7 x weekly - 2 sets - 10 reps   HEP rest of the day:   Access Code: [email or web address] URL: [email or web address] Date: 07/22/2022 Prepared by: Rivka Barbara  Exercises - Seated Long Arc Quad  - 1 x daily - 5 x weekly - 3 sets - 10 reps - 5 hold - Sit to Stand Without Arm Support  - 1 x daily - 5 x weekly - 2 sets - 8 reps - Romberg Stance with Eyes Closed  - 1 x daily - 5 x weekly - 1 sets - 3 reps - 30 hold - Tandem Stance  - 1 x daily - 5 x weekly - 1 sets - 3 reps - 30 hold - Standing Single Leg Stance with  Counter Support  - 1 x daily - 5 x weekly - 1 sets - 3 reps - 30 hold - Seated Gaze Stabilization with Head Nod  - 1 x daily - 7 x weekly - 3 sets - 2 reps - 30 second hold -  Seated Ankle Alphabet  - 1 x daily - 7 x weekly - 3 sets - Ankle Eversion with Resistance  - 1 x daily - 7 x weekly - 2 sets - 10 reps - 5 sec  hold - Ankle Dorsiflexion with Resistance  - 1 x daily - 7 x weekly - 2 sets - 10 reps - 5 sec hold  GOALS: Goals reviewed with patient? Yes  SHORT TERM GOALS: Target date: 08/25/2022    Pt will be independent with initial HEP in order to build upon functional gains made in therapy. Baseline: 12/28: Pt reports "kind of on my own I have gone back to the beginning" regarding resuming his initial HEP and that he feels confident/indep performing it Goal status: ONGOING  2.  Patient will increase Dynamic Gait Index to >19/24 as to demonstrate reduced falls risk and improved dynamic gait balance for better safety with community/home ambulation.  Baseline: 06/03/22: 12/24; 07/17/22: deferred Goal status: IN PROGRESS  3.  Pt will improve 5x sit<>stand to less than or equal to 14 sec to demonstrate improved functional strength and transfer efficiency.  Baseline: 15.63 seconds with no UE support; 12/28:  17 seconds hands free Goal status: IN PROGRESS  4.  Pt will ambulate at least 300' over level indoor/outdoor paved surfaces with supervision and LRAD in order to demo improved safety with mobility.  Baseline: No AD over level surfaces, supervision/CGA and unsteadiness.; 12/28: pt ambulates 149 ft with SPC, CGA prior to requiring rest break due to L glute spasm, performed over even surface Goal status: INITIAL  LONG TERM GOALS: Target date: 09/22/2022      Pt will be independent with final HEP in order to build upon functional gains made in therapy. Baseline: 07/17/22: pt reports performing initial HEP at the moment Goal status: IN PROGRESS  2.  Patient will increase dynamic gait  index score to >19/24 as to demonstrate reduced fall risk and improved dynamic gait balance for better safety with community/home ambulation.  Baseline: 06/03/22: 12/24; 07/17/22: deferred 07/22/22: 15/24 Goal status: IN PROGRESS  3.  Pt will improve condition 4 of mCTSIB to at least 12 seconds in order to demo improved vestibular input for balance.  Baseline: 3 seconds, pt losing balance to R.; 12/28: pt able to maintain all 4 conditions without LOB, but does exhibit increased sway in condition 1 and 2 Goal status: MET  4.  Pt will improve gait speed with LRAD or appropriate braces/no AD to at least 3.3 ft/sec in order to demo improved community mobility.   Baseline: 11.16 seconds = 2.93 ft/sec with no AD; 12/28: 2.2 ft/sec with SPC (using due to unsteadiness s/p recent falls) Goal status: IN PROGRESS  5.  Pt will ambulate at least 500' outdoors over paved/grass surfaces with LRAD and supervision in order to demo improved community mobility.  Baseline: 12/28: pt reports he does not think he could walk over uneven surfaces due to pain and unsteadiness Goal status: IN PROGRESS   ASSESSMENT:  CLINICAL IMPRESSION: Patient will benefit from one final recert of 1x/week.  Patient's goals performed two sessions prior, please refer to that note (07/17/22)for further details. Patient will benefit from slow return to balance focus in addition to pain reduction to improve quality of life. Patient is highly motivated for independent and safe mobility.  He has limited stability on unstable surfaces indicating area for continued focus. Pt will continue to benefit from skilled physical therapy intervention to address impairments, improve QOL, and attain  therapy goals.      OBJECTIVE IMPAIRMENTS: Abnormal gait, decreased activity tolerance, decreased balance, decreased knowledge of use of DME, difficulty walking, decreased ROM, decreased strength, and impaired sensation.   ACTIVITY LIMITATIONS: bending,  stairs, transfers, and locomotion level  PARTICIPATION LIMITATIONS: community activity and yard work  PERSONAL FACTORS: Past/current experiences, Time since onset of injury/illness/exacerbation, 3+ comorbidities: Arthritis, hx of pancreas cancer, depression, hx of subdural hematoma , hx of hip fracture ~13 yrs ago , and frequent falls (>10 in the past 6 months)  are also affecting patient's functional outcome.   REHAB POTENTIAL: Good  CLINICAL DECISION MAKING: Evolving/moderate complexity  EVALUATION COMPLEXITY: Moderate  PLAN:  PT FREQUENCY: 1x/week  PT DURATION: 8 weeks  PLANNED INTERVENTIONS: Therapeutic exercises, Therapeutic activity, Neuromuscular re-education, Balance training, Gait training, Patient/Family education, Self Care, Stair training, Vestibular training, Orthotic/Fit training, DME instructions, and Re-evaluation  PLAN FOR NEXT SESSION:   VOR, balance, continue plan  Janna Arch PT  07/28/22, 4:50 PM

## 2022-07-31 ENCOUNTER — Ambulatory Visit: Payer: BC Managed Care – PPO

## 2022-08-04 ENCOUNTER — Ambulatory Visit: Payer: BC Managed Care – PPO

## 2022-08-04 DIAGNOSIS — R2689 Other abnormalities of gait and mobility: Secondary | ICD-10-CM

## 2022-08-04 DIAGNOSIS — R42 Dizziness and giddiness: Secondary | ICD-10-CM

## 2022-08-04 DIAGNOSIS — R262 Difficulty in walking, not elsewhere classified: Secondary | ICD-10-CM

## 2022-08-04 DIAGNOSIS — M5459 Other low back pain: Secondary | ICD-10-CM | POA: Diagnosis not present

## 2022-08-04 DIAGNOSIS — M6281 Muscle weakness (generalized): Secondary | ICD-10-CM

## 2022-08-04 DIAGNOSIS — R296 Repeated falls: Secondary | ICD-10-CM

## 2022-08-04 DIAGNOSIS — R2681 Unsteadiness on feet: Secondary | ICD-10-CM

## 2022-08-04 NOTE — Therapy (Signed)
OUTPATIENT PHYSICAL THERAPY NEURO TREATMENT   Patient Name: Jared [name] MRN: [id number] DOB:[birth year], 67 y.o., male Today's Date: 08/04/2022  PCP:  [name], MD  REFERRING PROVIDER: [name], MD  PT End of Session - 08/04/22 1610     Visit Number 13    Number of Visits 20    Date for PT Re-Evaluation 09/22/22    Authorization Type [employer]    Progress Note Due on Visit 20    PT Start Time 8527    PT Stop Time 1652    PT Time Calculation (min) 43 min    Equipment Utilized During Treatment Gait belt    Activity Tolerance Patient tolerated treatment well;Other (comment)   pt required prolonged rest break (monitored by SPT and another PT) following session due to increased dizziness symptoms, improved with rest   Behavior During Therapy Impulsive;WFL for tasks assessed/performed                 Past Medical History:  Diagnosis Date   Arthritis    Cancer ([place])    pancreas   Complication of anesthesia    tends to come out of anesthesia during procedure   Depression    Dysphagia    Elevated lipids    GERD (gastroesophageal reflux disease)    H/O blood clots    superficial blood clot in left leg below knee tx w/ eliquis x 3 mo   History of kidney stones    Kidney disease    Neuroendocrine tumor    Subdural hematoma ([facility])    hx of   Tobacco abuse    Unsteadiness    Past Surgical History:  Procedure Laterality Date   ABDOMINAL SURGERY     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   CHOLECYSTECTOMY     added umbilical mesh   COLONOSCOPY WITH PROPOFOL N/A 03/27/2017   Procedure: COLONOSCOPY WITH PROPOFOL;  Surgeon: Manya Silvas, MD;  Location: [facility] ENDOSCOPY;  Service: Endoscopy;  Laterality: N/A;   ESOPHAGOGASTRODUODENOSCOPY (EGD) WITH PROPOFOL N/A 03/27/2017   Procedure: ESOPHAGOGASTRODUODENOSCOPY (EGD) WITH PROPOFOL;  Surgeon: Manya Silvas, MD;  Location: [facility] ENDOSCOPY;  Service: Endoscopy;  Laterality: N/A;   hip fracture with  rod placement Right    MASS EXCISION Left 05/24/2021   Procedure: EXCISION MASS;  Surgeon: Benjamine Sprague, DO;  Location: [facility] ORS;  Service: General;  Laterality: Left;  Prone postition   Patient Active Problem List   Diagnosis Date Noted   AAA (abdominal aortic aneurysm) without rupture ([place]) 01/02/2022   RLS (restless legs syndrome) 09/19/2020   Varicose veins of both lower extremities 09/19/2020   TIA (transient ischemic attack) 01/01/2020   History of TIA (transient ischemic attack) 12/27/2019   PVC's (premature ventricular contractions) 12/27/2019   Pill dysphagia 03/17/2017   Hx of colonic polyps 12/05/2016   Irregular heart rate 12/05/2016   Hyperlipidemia 09/07/2015   Tobacco abuse 09/07/2015   Calculus, kidney 04/02/2015   Neuroendocrine tumor 06/22/2008   ONSET DATE: 05/19/2022  REFERRING DIAG: R26.9 (ICD-10-CM) - Gait difficulty R41.3 (ICD-10-CM) - Memory loss  THERAPY DIAG:  Other low back pain  Muscle weakness (generalized)  Unsteadiness on feet  Difficulty in walking, not elsewhere classified  Dizziness and giddiness  Repeated falls  Other abnormalities of gait and mobility  Rationale for Evaluation and Treatment: Rehabilitation  SUBJECTIVE:  SUBJECTIVE STATEMENT:  Pt reports that he is doing well and is running a little bit behind 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    Pt accompanied by: self  PERTINENT HISTORY:  67 year old male here for evaluation of gait and balance difficulty and memory loss. Symptoms have been present since 2021. He has developed numbness in his feet, gait and balance difficulty. Has difficulty in the shower when he closes eyes. He feels like he is drunk when he walks. He tends to lose his balance. He is fallen 10  times last few months.  PMH: Arthritis, hx of pancreas cancer, depression, hx of subdural hematoma , hx of hip fracture ~13 yrs ago   PAIN:  Are you having pain? No  PRECAUTIONS: Fall  WEIGHT BEARING RESTRICTIONS: No  FALLS: Has patient fallen in last 6 months? Yes. Number of falls >10 falls   LIVING ENVIRONMENT: Lives with: lives with their spouse and and wife's mother Lives in: House/apartment Stairs: No Has following equipment at home: Single point cane and Grab bars - has suction cup grab bars   PLOF: Independent  PATIENT GOALS: Wants to improve balance.   OBJECTIVE:   DIAGNOSTIC FINDINGS:  03/27/20 MRI BRAIN 1. Positive for diffuse bilateral subdural hematoma versus dural thickening, 2-3 mm in thickness throughout both the entire posterior fossa and bilateral supratentorial brain. Follow-up noncontrast Head CT might be able to confirm hyperdense blood products, although small SDH this size will frequently be occult by CT. Follow-up Post-contrast brain MRI would be valuable although a degree of dural thickening is typically seen with SDH. Otherwise CSF analysis might also be valuable. 2. No significant intracranial mass effect or other acute intracranial abnormality. 3. MRAs today are reported separately.  Brain and lumbar spine MRI scheduled for 06/09/22  IMPRESSION: This MRI of the lumbar spine without contrast shows the following: 1. At L4-L5, there is severe loss of disc height and other degenerative change causing moderately severe foraminal narrowing and moderate lateral recess stenosis.  There is potential for L4 nerve root compression.  There is no spinal stenosis. 2. At L5-S1, there are degenerative changes causing moderately severe foraminal narrowing and moderate lateral recess stenosis.  There is potential for L5 nerve root compression to either side.  There is no spinal stenosis. 3. Milder degenerative changes at the other lumbar levels do not lead to  spinal stenosis or nerve root compression.   COGNITION: Overall cognitive status: Within functional limits for tasks assessed, however pt reporting some memory issues.    SENSATION: Light touch: Reports numbness/tingling in feet and in thighs, with light touch pt more impaired distally in RLE  Proprioception: WFL at bilat ankles   COORDINATION: Heel to shin: WFL bilat   LOWER EXTREMITY MMT:    MMT Right Eval Left Eval  Hip flexion 4/5 4-/5  Hip extension    Hip abduction 5/5 5/5  Hip adduction 5/5 5/5  Hip internal rotation    Hip external rotation    Knee flexion 5/5 5/5  Knee extension 4/5 4+/5  Ankle dorsiflexion 3-/5 3-/5  Ankle plantarflexion    Ankle inversion    Ankle eversion    (Blank rows = not tested)  Strength testing performed in sitting.   TRANSFERS: Assistive device utilized: None  Sit to stand: SBA Stand to sit: SBA From standard chair can perform without UE support, but reports at home has difficulty getting up from lower surfaces like the couch.   GAIT:  Gait pattern: decreased ankle dorsiflexion- Right, decreased ankle dorsiflexion- Left, Right steppage, Left steppage, narrow BOS, poor foot clearance- Right, and poor foot clearance- Left Distance walked: Clinic distances  Assistive device utilized: None Level of assistance: SBA and CGA Comments: Pt with bilat foot drop and unsteadiness with gait with some staggering requiring CGA. Pt with tendency to almost scissor at times and have a narrow BOS.    FUNCTIONAL TESTS:  5 times sit to stand: 15.63 seconds with no UE support  Timed up and go (TUG): 9 seconds, cog TUG: 14.85 seconds 10 meter walk test: 11.16 seconds = 2.93 ft/sec with no AD   Feet together EO on level ground: 30 seconds, on air ex: 30 seconds  M-CTSIB (with feet hip width distance)  Condition 1: Firm Surface, EO 30 Sec, Normal Sway  Condition 2: Firm Surface, EC 30 Sec, Mild Sway, pt shifting weight posteriorly on heels  Condition  3: Foam Surface, EO 30 Sec, Mild Sway  Condition 4: Foam Surface, EC 3 Sec, pt losing balance to the R.      TODAY'S TREATMENT: DATE: 08/04/22  Neuro Re-ed:    VOR x1 with horizontal head turns, x10 standing VOR x1 with vertical head turns, x10 standing Airex balance beam:  -lateral stepping 4x length of // bars  -tandem walk 4x length of // bar with UE support -chest press with PVC pipe 15x  Airex pad: basketball toss x2 baskets full of foam balls  BlazePod Exercise 1: Activity Description: Random tapping of UE's and LE's only Blue light Activity Setting:  Focus Number of Pods:  6 Cycles/Sets:  2 Duration (Time or Hit Count):  30 hits  Patient Stats  Reaction Time:   1,120 1,331  BlazePod Exercise 2: Activity Description: Random tapping of UE's and LE's (Red Light = R UE/R LE; Green Light = L UE/L LE) Activity Setting:  Random Number of Pods:  6 Cycles/Sets:  2 Duration (Time or Hit Count):  30 hits  Patient Stats  Reaction Time:   1,415 (1 strike) 1,122 (1 strike)   PATIENT EDUCATION:  Education details: testing, indications of test performance, plan, encourages follow-up with physician s/p fall Person educated: Patient Education method: Explanation, Demonstration, and Verbal cues Education comprehension: verbalized understanding, returned demonstration, and needs further education  HOME EXERCISE PROGRAM: Morning/ In Bed HEP Access Code: [email or web address] URL: [email or web address] Date: 07/22/2022 Prepared by: Rivka Barbara  Exercises - Supine Single Knee to Chest Stretch  - 1 x daily - 7 x weekly - 3 sets - 10 reps - 30 seconds  hold - Supine Lower Trunk Rotation  - 1 x daily - 7 x weekly - 1 sets - 10 reps - 5 sec hold - Supine Bridge with Resistance Band  - 1 x daily - 7 x weekly - 2 sets - 10 reps - Clamshell  - 1 x daily - 7 x weekly - 2 sets - 10 reps   HEP rest of the day:   Access Code: [email or web address] URL:  [email or web address] Date: 07/22/2022 Prepared by: Rivka Barbara  Exercises - Seated Long Arc Quad  - 1 x daily - 5 x weekly - 3 sets - 10 reps - 5 hold - Sit to Stand Without Arm Support  - 1 x daily - 5 x weekly - 2 sets - 8 reps - Romberg Stance with Eyes Closed  - 1 x daily - 5 x weekly - 1 sets - 3 reps - 30 hold - Tandem Stance  -  1 x daily - 5 x weekly - 1 sets - 3 reps - 30 hold - Standing Single Leg Stance with Counter Support  - 1 x daily - 5 x weekly - 1 sets - 3 reps - 30 hold - Seated Gaze Stabilization with Head Nod  - 1 x daily - 7 x weekly - 3 sets - 2 reps - 30 second hold - Seated Ankle Alphabet  - 1 x daily - 7 x weekly - 3 sets - Ankle Eversion with Resistance  - 1 x daily - 7 x weekly - 2 sets - 10 reps - 5 sec  hold - Ankle Dorsiflexion with Resistance  - 1 x daily - 7 x weekly - 2 sets - 10 reps - 5 sec hold  GOALS: Goals reviewed with patient? Yes  SHORT TERM GOALS: Target date: 08/25/2022  Pt will be independent with initial HEP in order to build upon functional gains made in therapy. Baseline: 12/28: Pt reports "kind of on my own I have gone back to the beginning" regarding resuming his initial HEP and that he feels confident/indep performing it Goal status: ONGOING  2.  Patient will increase Dynamic Gait Index to >19/24 as to demonstrate reduced falls risk and improved dynamic gait balance for better safety with community/home ambulation.  Baseline: 06/03/22: 12/24; 07/17/22: deferred Goal status: IN PROGRESS  3.  Pt will improve 5x sit<>stand to less than or equal to 14 sec to demonstrate improved functional strength and transfer efficiency.  Baseline: 15.63 seconds with no UE support; 12/28:  17 seconds hands free Goal status: IN PROGRESS  4.  Pt will ambulate at least 300' over level indoor/outdoor paved surfaces with supervision and LRAD in order to demo improved safety with mobility.  Baseline: No AD over level surfaces, supervision/CGA  and unsteadiness.; 12/28: pt ambulates 149 ft with SPC, CGA prior to requiring rest break due to L glute spasm, performed over even surface Goal status: INITIAL  LONG TERM GOALS: Target date: 09/22/2022   Pt will be independent with final HEP in order to build upon functional gains made in therapy. Baseline: 07/17/22: pt reports performing initial HEP at the moment Goal status: IN PROGRESS  2.  Patient will increase dynamic gait index score to >19/24 as to demonstrate reduced fall risk and improved dynamic gait balance for better safety with community/home ambulation.  Baseline: 06/03/22: 12/24; 07/17/22: deferred 07/22/22: 15/24 Goal status: IN PROGRESS  3.  Pt will improve condition 4 of mCTSIB to at least 12 seconds in order to demo improved vestibular input for balance.  Baseline: 3 seconds, pt losing balance to R.; 12/28: pt able to maintain all 4 conditions without LOB, but does exhibit increased sway in condition 1 and 2 Goal status: MET  4.  Pt will improve gait speed with LRAD or appropriate braces/no AD to at least 3.3 ft/sec in order to demo improved community mobility.   Baseline: 11.16 seconds = 2.93 ft/sec with no AD; 12/28: 2.2 ft/sec with SPC (using due to unsteadiness s/p recent falls) Goal status: IN PROGRESS  5.  Pt will ambulate at least 500' outdoors over paved/grass surfaces with LRAD and supervision in order to demo improved community mobility.  Baseline: 12/28: pt reports he does not think he could walk over uneven surfaces due to pain and unsteadiness Goal status: IN PROGRESS   ASSESSMENT:  CLINICAL IMPRESSION:  Pt improving in his balance and put forth good effort throughout the session today.  Pt able  to tolerate increasingly more difficult tasks as noted above and was given cognitive tasks with use of the blaze pods as well.  Pt enjoyed the challenge of the basketball toss and the use of the blazepods and will be implement in the future as necessary for  improving cognitive tasks while performing single leg stance as well.   Pt will continue to benefit from skilled therapy to address remaining deficits in order to improve overall QoL and return to PLOF.       OBJECTIVE IMPAIRMENTS: Abnormal gait, decreased activity tolerance, decreased balance, decreased knowledge of use of DME, difficulty walking, decreased ROM, decreased strength, and impaired sensation.   ACTIVITY LIMITATIONS: bending, stairs, transfers, and locomotion level  PARTICIPATION LIMITATIONS: community activity and yard work  PERSONAL FACTORS: Past/current experiences, Time since onset of injury/illness/exacerbation, 3+ comorbidities: Arthritis, hx of pancreas cancer, depression, hx of subdural hematoma , hx of hip fracture ~13 yrs ago , and frequent falls (>10 in the past 6 months)  are also affecting patient's functional outcome.   REHAB POTENTIAL: Good  CLINICAL DECISION MAKING: Evolving/moderate complexity  EVALUATION COMPLEXITY: Moderate  PLAN:  PT FREQUENCY: 1x/week  PT DURATION: 8 weeks  PLANNED INTERVENTIONS: Therapeutic exercises, Therapeutic activity, Neuromuscular re-education, Balance training, Gait training, Patient/Family education, Self Care, Stair training, Vestibular training, Orthotic/Fit training, DME instructions, and Re-evaluation  PLAN FOR NEXT SESSION:   VOR, balance, continue plan  Gwenlyn Saran, PT, DPT Physical Therapist- [facility]  08/04/22, 5:36 PM

## 2022-08-06 ENCOUNTER — Ambulatory Visit: Payer: BC Managed Care – PPO

## 2022-08-11 ENCOUNTER — Ambulatory Visit: Payer: BC Managed Care – PPO

## 2022-08-12 NOTE — Therapy (Signed)
OUTPATIENT PHYSICAL THERAPY NEURO TREATMENT   Patient Name: Jared [name] MRN: [id number] DOB:06/05/1956, 67 y.o., male Today's Date: 08/13/2022  PCP:  [name], MD  REFERRING PROVIDER: [name], MD  PT End of Session - 08/13/22 1656     Visit Number 14    Number of Visits 20    Date for PT Re-Evaluation 09/22/22    Authorization Type [employer]    Progress Note Due on Visit 20    PT Start Time 1656    PT Stop Time 1730    PT Time Calculation (min) 34 min    Equipment Utilized During Treatment Gait belt    Activity Tolerance Patient tolerated treatment well;Other (comment)   pt required prolonged rest break (monitored by SPT and another PT) following session due to increased dizziness symptoms, improved with rest   Behavior During Therapy Impulsive;WFL for tasks assessed/performed                  Past Medical History:  Diagnosis Date   Arthritis    Cancer ([place])    pancreas   Complication of anesthesia    tends to come out of anesthesia during procedure   Depression    Dysphagia    Elevated lipids    GERD (gastroesophageal reflux disease)    H/O blood clots    superficial blood clot in left leg below knee tx w/ eliquis x 3 mo   History of kidney stones    Kidney disease    Neuroendocrine tumor    Subdural hematoma ([facility])    hx of   Tobacco abuse    Unsteadiness    Past Surgical History:  Procedure Laterality Date   ABDOMINAL SURGERY     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   CHOLECYSTECTOMY     added umbilical mesh   COLONOSCOPY WITH PROPOFOL N/A 03/27/2017   Procedure: COLONOSCOPY WITH PROPOFOL;  Surgeon: Manya Silvas, MD;  Location: [facility] ENDOSCOPY;  Service: Endoscopy;  Laterality: N/A;   ESOPHAGOGASTRODUODENOSCOPY (EGD) WITH PROPOFOL N/A 03/27/2017   Procedure: ESOPHAGOGASTRODUODENOSCOPY (EGD) WITH PROPOFOL;  Surgeon: Manya Silvas, MD;  Location: [facility] ENDOSCOPY;  Service: Endoscopy;  Laterality: N/A;   hip fracture  with rod placement Right    MASS EXCISION Left 05/24/2021   Procedure: EXCISION MASS;  Surgeon: Benjamine Sprague, DO;  Location: [facility] ORS;  Service: General;  Laterality: Left;  Prone postition   Patient Active Problem List   Diagnosis Date Noted   AAA (abdominal aortic aneurysm) without rupture ([place]) 01/02/2022   RLS (restless legs syndrome) 09/19/2020   Varicose veins of both lower extremities 09/19/2020   TIA (transient ischemic attack) 01/01/2020   History of TIA (transient ischemic attack) 12/27/2019   PVC's (premature ventricular contractions) 12/27/2019   Pill dysphagia 03/17/2017   Hx of colonic polyps 12/05/2016   Irregular heart rate 12/05/2016   Hyperlipidemia 09/07/2015   Tobacco abuse 09/07/2015   Calculus, kidney 04/02/2015   Neuroendocrine tumor 06/22/2008   ONSET DATE: 05/19/2022  REFERRING DIAG: R26.9 (ICD-10-CM) - Gait difficulty R41.3 (ICD-10-CM) - Memory loss  THERAPY DIAG:  Other low back pain  Muscle weakness (generalized)  Unsteadiness on feet  Difficulty in walking, not elsewhere classified  Rationale for Evaluation and Treatment: Rehabilitation  SUBJECTIVE:  SUBJECTIVE STATEMENT:  Patient reports he got off of work early and took a nap, that's why he is late. No falls since last seen.   Pt accompanied by: self  PERTINENT HISTORY:  67 year old male here for evaluation of gait and balance difficulty and memory loss. Symptoms have been present since 2021. He has developed numbness in his feet, gait and balance difficulty. Has difficulty in the shower when he closes eyes. He feels like he is drunk when he walks. He tends to lose his balance. He is fallen 10 times last few months.  PMH: Arthritis, hx of pancreas cancer, depression, hx of subdural hematoma , hx of hip  fracture ~13 yrs ago   PAIN:  Are you having pain? No  PRECAUTIONS: Fall  WEIGHT BEARING RESTRICTIONS: No  FALLS: Has patient fallen in last 6 months? Yes. Number of falls >10 falls   LIVING ENVIRONMENT: Lives with: lives with their spouse and and wife's mother Lives in: House/apartment Stairs: No Has following equipment at home: Single point cane and Grab bars - has suction cup grab bars   PLOF: Independent  PATIENT GOALS: Wants to improve balance.   OBJECTIVE:   DIAGNOSTIC FINDINGS:  03/27/20 MRI BRAIN 1. Positive for diffuse bilateral subdural hematoma versus dural thickening, 2-3 mm in thickness throughout both the entire posterior fossa and bilateral supratentorial brain. Follow-up noncontrast Head CT might be able to confirm hyperdense blood products, although small SDH this size will frequently be occult by CT. Follow-up Post-contrast brain MRI would be valuable although a degree of dural thickening is typically seen with SDH. Otherwise CSF analysis might also be valuable. 2. No significant intracranial mass effect or other acute intracranial abnormality. 3. MRAs today are reported separately.  Brain and lumbar spine MRI scheduled for 06/09/22  IMPRESSION: This MRI of the lumbar spine without contrast shows the following: 1. At L4-L5, there is severe loss of disc height and other degenerative change causing moderately severe foraminal narrowing and moderate lateral recess stenosis.  There is potential for L4 nerve root compression.  There is no spinal stenosis. 2. At L5-S1, there are degenerative changes causing moderately severe foraminal narrowing and moderate lateral recess stenosis.  There is potential for L5 nerve root compression to either side.  There is no spinal stenosis. 3. Milder degenerative changes at the other lumbar levels do not lead to spinal stenosis or nerve root compression.   COGNITION: Overall cognitive status: Within functional limits for  tasks assessed, however pt reporting some memory issues.    SENSATION: Light touch: Reports numbness/tingling in feet and in thighs, with light touch pt more impaired distally in RLE  Proprioception: WFL at bilat ankles   COORDINATION: Heel to shin: WFL bilat   LOWER EXTREMITY MMT:    MMT Right Eval Left Eval  Hip flexion 4/5 4-/5  Hip extension    Hip abduction 5/5 5/5  Hip adduction 5/5 5/5  Hip internal rotation    Hip external rotation    Knee flexion 5/5 5/5  Knee extension 4/5 4+/5  Ankle dorsiflexion 3-/5 3-/5  Ankle plantarflexion    Ankle inversion    Ankle eversion    (Blank rows = not tested)  Strength testing performed in sitting.   TRANSFERS: Assistive device utilized: None  Sit to stand: SBA Stand to sit: SBA From standard chair can perform without UE support, but reports at home has difficulty getting up from lower surfaces like the couch.   GAIT: Gait pattern: decreased ankle dorsiflexion-  Right, decreased ankle dorsiflexion- Left, Right steppage, Left steppage, narrow BOS, poor foot clearance- Right, and poor foot clearance- Left Distance walked: Clinic distances  Assistive device utilized: None Level of assistance: SBA and CGA Comments: Pt with bilat foot drop and unsteadiness with gait with some staggering requiring CGA. Pt with tendency to almost scissor at times and have a narrow BOS.    FUNCTIONAL TESTS:  5 times sit to stand: 15.63 seconds with no UE support  Timed up and go (TUG): 9 seconds, cog TUG: 14.85 seconds 10 meter walk test: 11.16 seconds = 2.93 ft/sec with no AD   Feet together EO on level ground: 30 seconds, on air ex: 30 seconds  M-CTSIB (with feet hip width distance)  Condition 1: Firm Surface, EO 30 Sec, Normal Sway  Condition 2: Firm Surface, EC 30 Sec, Mild Sway, pt shifting weight posteriorly on heels  Condition 3: Foam Surface, EO 30 Sec, Mild Sway  Condition 4: Foam Surface, EC 3 Sec, pt losing balance to the R.       TODAY'S TREATMENT: DATE: 08/13/22  Neuro Re-ed:    VOR x2 with horizontal head turns, x30 seconds VOR x2 with vertical head turns, x30 seconds Step over hurdle and back 10x each LE  Half foam roller: -static stand 30 seconds -heel toe rock 15x  Airex balance beam:  -lateral stepping 4x length of // bars ; no UE support  -tandem walk 4x length of // bar with UE support -chest press with PVC pipe 15x   Squat to overhead press 10x   Lateral lunge to bosu ball round side up 10x each side   PATIENT EDUCATION:  Education details: testing, indications of test performance, plan, encourages follow-up with physician s/p fall Person educated: Patient Education method: Explanation, Demonstration, and Verbal cues Education comprehension: verbalized understanding, returned demonstration, and needs further education  HOME EXERCISE PROGRAM: Morning/ In Bed HEP Access Code: [email or web address] URL: [email or web address] Date: 07/22/2022 Prepared by: Rivka Barbara  Exercises - Supine Single Knee to Chest Stretch  - 1 x daily - 7 x weekly - 3 sets - 10 reps - 30 seconds  hold - Supine Lower Trunk Rotation  - 1 x daily - 7 x weekly - 1 sets - 10 reps - 5 sec hold - Supine Bridge with Resistance Band  - 1 x daily - 7 x weekly - 2 sets - 10 reps - Clamshell  - 1 x daily - 7 x weekly - 2 sets - 10 reps   HEP rest of the day:   Access Code: [email or web address] URL: [email or web address] Date: 07/22/2022 Prepared by: Rivka Barbara  Exercises - Seated Long Arc Quad  - 1 x daily - 5 x weekly - 3 sets - 10 reps - 5 hold - Sit to Stand Without Arm Support  - 1 x daily - 5 x weekly - 2 sets - 8 reps - Romberg Stance with Eyes Closed  - 1 x daily - 5 x weekly - 1 sets - 3 reps - 30 hold - Tandem Stance  - 1 x daily - 5 x weekly - 1 sets - 3 reps - 30 hold - Standing Single Leg Stance with Counter Support  - 1 x daily - 5 x weekly - 1 sets - 3 reps - 30 hold - Seated Gaze  Stabilization with Head Nod  - 1 x daily - 7 x weekly - 3 sets - 2 reps - 30 second hold - Seated Ankle Alphabet  -  1 x daily - 7 x weekly - 3 sets - Ankle Eversion with Resistance  - 1 x daily - 7 x weekly - 2 sets - 10 reps - 5 sec  hold - Ankle Dorsiflexion with Resistance  - 1 x daily - 7 x weekly - 2 sets - 10 reps - 5 sec hold  GOALS: Goals reviewed with patient? Yes  SHORT TERM GOALS: Target date: 08/25/2022  Pt will be independent with initial HEP in order to build upon functional gains made in therapy. Baseline: 12/28: Pt reports "kind of on my own I have gone back to the beginning" regarding resuming his initial HEP and that he feels confident/indep performing it Goal status: ONGOING  2.  Patient will increase Dynamic Gait Index to >19/24 as to demonstrate reduced falls risk and improved dynamic gait balance for better safety with community/home ambulation.  Baseline: 06/03/22: 12/24; 07/17/22: deferred Goal status: IN PROGRESS  3.  Pt will improve 5x sit<>stand to less than or equal to 14 sec to demonstrate improved functional strength and transfer efficiency.  Baseline: 15.63 seconds with no UE support; 12/28:  17 seconds hands free Goal status: IN PROGRESS  4.  Pt will ambulate at least 300' over level indoor/outdoor paved surfaces with supervision and LRAD in order to demo improved safety with mobility.  Baseline: No AD over level surfaces, supervision/CGA and unsteadiness.; 12/28: pt ambulates 149 ft with SPC, CGA prior to requiring rest break due to L glute spasm, performed over even surface Goal status: INITIAL  LONG TERM GOALS: Target date: 09/22/2022   Pt will be independent with final HEP in order to build upon functional gains made in therapy. Baseline: 07/17/22: pt reports performing initial HEP at the moment Goal status: IN PROGRESS  2.  Patient will increase dynamic gait index score to >19/24 as to demonstrate reduced fall risk and improved dynamic gait balance  for better safety with community/home ambulation.  Baseline: 06/03/22: 12/24; 07/17/22: deferred 07/22/22: 15/24 Goal status: IN PROGRESS  3.  Pt will improve condition 4 of mCTSIB to at least 12 seconds in order to demo improved vestibular input for balance.  Baseline: 3 seconds, pt losing balance to R.; 12/28: pt able to maintain all 4 conditions without LOB, but does exhibit increased sway in condition 1 and 2 Goal status: MET  4.  Pt will improve gait speed with LRAD or appropriate braces/no AD to at least 3.3 ft/sec in order to demo improved community mobility.   Baseline: 11.16 seconds = 2.93 ft/sec with no AD; 12/28: 2.2 ft/sec with SPC (using due to unsteadiness s/p recent falls) Goal status: IN PROGRESS  5.  Pt will ambulate at least 500' outdoors over paved/grass surfaces with LRAD and supervision in order to demo improved community mobility.  Baseline: 12/28: pt reports he does not think he could walk over uneven surfaces due to pain and unsteadiness Goal status: IN PROGRESS   ASSESSMENT:  CLINICAL IMPRESSION:  Patient presents late to PT session limiting session duration. Patient is highly motivated throughout session. Ankle righting reactions are improving with stable and unstable surfaces. Next session will assess goals for discharge.    Pt will continue to benefit from skilled therapy to address remaining deficits in order to improve overall QoL and return to PLOF.       OBJECTIVE IMPAIRMENTS: Abnormal gait, decreased activity tolerance, decreased balance, decreased knowledge of use of DME, difficulty walking, decreased ROM, decreased strength, and impaired sensation.   ACTIVITY LIMITATIONS:  bending, stairs, transfers, and locomotion level  PARTICIPATION LIMITATIONS: community activity and yard work  PERSONAL FACTORS: Past/current experiences, Time since onset of injury/illness/exacerbation, 3+ comorbidities: Arthritis, hx of pancreas cancer, depression, hx of subdural  hematoma , hx of hip fracture ~13 yrs ago , and frequent falls (>10 in the past 6 months)  are also affecting patient's functional outcome.   REHAB POTENTIAL: Good  CLINICAL DECISION MAKING: Evolving/moderate complexity  EVALUATION COMPLEXITY: Moderate  PLAN:  PT FREQUENCY: 1x/week  PT DURATION: 8 weeks  PLANNED INTERVENTIONS: Therapeutic exercises, Therapeutic activity, Neuromuscular re-education, Balance training, Gait training, Patient/Family education, Self Care, Stair training, Vestibular training, Orthotic/Fit training, DME instructions, and Re-evaluation  PLAN FOR NEXT SESSION:  discharge  [facility]  08/13/22, 5:33 PM

## 2022-08-13 ENCOUNTER — Ambulatory Visit: Payer: BC Managed Care – PPO

## 2022-08-13 DIAGNOSIS — R2681 Unsteadiness on feet: Secondary | ICD-10-CM

## 2022-08-13 DIAGNOSIS — M6281 Muscle weakness (generalized): Secondary | ICD-10-CM

## 2022-08-13 DIAGNOSIS — M5459 Other low back pain: Secondary | ICD-10-CM

## 2022-08-13 DIAGNOSIS — R262 Difficulty in walking, not elsewhere classified: Secondary | ICD-10-CM

## 2022-08-14 NOTE — Therapy (Incomplete)
OUTPATIENT PHYSICAL THERAPY NEURO TREATMENT/DISCHARGE   Patient Name: Jared [name] MRN: [id number] DOB:04/15/1956, 67 y.o., male Today's Date: 08/14/2022  PCP:  [name], MD  REFERRING PROVIDER: [name], MD         Past Medical History:  Diagnosis Date   Arthritis    Cancer [facility])    pancreas   Complication of anesthesia    tends to come out of anesthesia during procedure   Depression    Dysphagia    Elevated lipids    GERD (gastroesophageal reflux disease)    H/O blood clots    superficial blood clot in left leg below knee tx w/ eliquis x 3 mo   History of kidney stones    Kidney disease    Neuroendocrine tumor    Subdural hematoma (HCC)    hx of   Tobacco abuse    Unsteadiness    Past Surgical History:  Procedure Laterality Date   ABDOMINAL SURGERY     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   CHOLECYSTECTOMY     added umbilical mesh   COLONOSCOPY WITH PROPOFOL N/A 03/27/2017   Procedure: COLONOSCOPY WITH PROPOFOL;  Surgeon: Manya Silvas, MD;  Location: [facility] ENDOSCOPY;  Service: Endoscopy;  Laterality: N/A;   ESOPHAGOGASTRODUODENOSCOPY (EGD) WITH PROPOFOL N/A 03/27/2017   Procedure: ESOPHAGOGASTRODUODENOSCOPY (EGD) WITH PROPOFOL;  Surgeon: Manya Silvas, MD;  Location: [facility] ENDOSCOPY;  Service: Endoscopy;  Laterality: N/A;   hip fracture with rod placement Right    MASS EXCISION Left 05/24/2021   Procedure: EXCISION MASS;  Surgeon: Benjamine Sprague, DO;  Location: [facility] ORS;  Service: General;  Laterality: Left;  Prone postition   Patient Active Problem List   Diagnosis Date Noted   AAA (abdominal aortic aneurysm) without rupture ([place]) 01/02/2022   RLS (restless legs syndrome) 09/19/2020   Varicose veins of both lower extremities 09/19/2020   TIA (transient ischemic attack) 01/01/2020   History of TIA (transient ischemic attack) 12/27/2019   PVC's (premature ventricular contractions) 12/27/2019   Pill dysphagia 03/17/2017    Hx of colonic polyps 12/05/2016   Irregular heart rate 12/05/2016   Hyperlipidemia 09/07/2015   Tobacco abuse 09/07/2015   Calculus, kidney 04/02/2015   Neuroendocrine tumor 06/22/2008   ONSET DATE: 05/19/2022  REFERRING DIAG: R26.9 (ICD-10-CM) - Gait difficulty R41.3 (ICD-10-CM) - Memory loss  THERAPY DIAG:  No diagnosis found.  Rationale for Evaluation and Treatment: Rehabilitation  SUBJECTIVE:  SUBJECTIVE STATEMENT:  *** Patient aware today is discharge day  Pt accompanied by: self  PERTINENT HISTORY:  67 year old male here for evaluation of gait and balance difficulty and memory loss. Symptoms have been present since 2021. He has developed numbness in his feet, gait and balance difficulty. Has difficulty in the shower when he closes eyes. He feels like he is drunk when he walks. He tends to lose his balance. He is fallen 10 times last few months.  PMH: Arthritis, hx of pancreas cancer, depression, hx of subdural hematoma , hx of hip fracture ~13 yrs ago   PAIN:  Are you having pain? No  PRECAUTIONS: Fall  WEIGHT BEARING RESTRICTIONS: No  FALLS: Has patient fallen in last 6 months? Yes. Number of falls >10 falls   LIVING ENVIRONMENT: Lives with: lives with their spouse and and wife's mother Lives in: House/apartment Stairs: No Has following equipment at home: Single point cane and Grab bars - has suction cup grab bars   PLOF: Independent  PATIENT GOALS: Wants to improve balance.   OBJECTIVE:   DIAGNOSTIC FINDINGS:  03/27/20 MRI BRAIN 1. Positive for diffuse bilateral subdural hematoma versus dural thickening, 2-3 mm in thickness throughout both the entire posterior fossa and bilateral supratentorial brain. Follow-up noncontrast Head CT might be able to confirm hyperdense blood  products, although small SDH this size will frequently be occult by CT. Follow-up Post-contrast brain MRI would be valuable although a degree of dural thickening is typically seen with SDH. Otherwise CSF analysis might also be valuable. 2. No significant intracranial mass effect or other acute intracranial abnormality. 3. MRAs today are reported separately.  Brain and lumbar spine MRI scheduled for 06/09/22  IMPRESSION: This MRI of the lumbar spine without contrast shows the following: 1. At L4-L5, there is severe loss of disc height and other degenerative change causing moderately severe foraminal narrowing and moderate lateral recess stenosis.  There is potential for L4 nerve root compression.  There is no spinal stenosis. 2. At L5-S1, there are degenerative changes causing moderately severe foraminal narrowing and moderate lateral recess stenosis.  There is potential for L5 nerve root compression to either side.  There is no spinal stenosis. 3. Milder degenerative changes at the other lumbar levels do not lead to spinal stenosis or nerve root compression.   COGNITION: Overall cognitive status: Within functional limits for tasks assessed, however pt reporting some memory issues.    SENSATION: Light touch: Reports numbness/tingling in feet and in thighs, with light touch pt more impaired distally in RLE  Proprioception: WFL at bilat ankles   COORDINATION: Heel to shin: WFL bilat   LOWER EXTREMITY MMT:    MMT Right Eval Left Eval  Hip flexion 4/5 4-/5  Hip extension    Hip abduction 5/5 5/5  Hip adduction 5/5 5/5  Hip internal rotation    Hip external rotation    Knee flexion 5/5 5/5  Knee extension 4/5 4+/5  Ankle dorsiflexion 3-/5 3-/5  Ankle plantarflexion    Ankle inversion    Ankle eversion    (Blank rows = not tested)  Strength testing performed in sitting.   TRANSFERS: Assistive device utilized: None  Sit to stand: SBA Stand to sit: SBA From standard chair  can perform without UE support, but reports at home has difficulty getting up from lower surfaces like the couch.   GAIT: Gait pattern: decreased ankle dorsiflexion- Right, decreased ankle dorsiflexion- Left, Right steppage, Left steppage, narrow BOS, poor foot clearance- Right, and  poor foot clearance- Left Distance walked: Clinic distances  Assistive device utilized: None Level of assistance: SBA and CGA Comments: Pt with bilat foot drop and unsteadiness with gait with some staggering requiring CGA. Pt with tendency to almost scissor at times and have a narrow BOS.    FUNCTIONAL TESTS:  5 times sit to stand: 15.63 seconds with no UE support  Timed up and go (TUG): 9 seconds, cog TUG: 14.85 seconds 10 meter walk test: 11.16 seconds = 2.93 ft/sec with no AD   Feet together EO on level ground: 30 seconds, on air ex: 30 seconds  M-CTSIB (with feet hip width distance)  Condition 1: Firm Surface, EO 30 Sec, Normal Sway  Condition 2: Firm Surface, EC 30 Sec, Mild Sway, pt shifting weight posteriorly on heels  Condition 3: Foam Surface, EO 30 Sec, Mild Sway  Condition 4: Foam Surface, EC 3 Sec, pt losing balance to the R.      TODAY'S TREATMENT: DATE: 08/14/22 Goals: see below for details   Neuro Re-ed:    VOR x2 with horizontal head turns, x30 seconds VOR x2 with vertical head turns, x30 seconds Step over hurdle and back 10x each LE  Half foam roller: -static stand 30 seconds -heel toe rock 15x  Airex balance beam:  -lateral stepping 4x length of // bars ; no UE support  -tandem walk 4x length of // bar with UE support -chest press with PVC pipe 15x   Squat to overhead press 10x   Lateral lunge to bosu ball round side up 10x each side   PATIENT EDUCATION:  Education details: testing, indications of test performance, plan, encourages follow-up with physician s/p fall Person educated: Patient Education method: Explanation, Demonstration, and Verbal cues Education  comprehension: verbalized understanding, returned demonstration, and needs further education  HOME EXERCISE PROGRAM: Morning/ In Bed HEP Access Code: [email or web address] URL: [email or web address] Date: 07/22/2022 Prepared by: Rivka Barbara  Exercises - Supine Single Knee to Chest Stretch  - 1 x daily - 7 x weekly - 3 sets - 10 reps - 30 seconds  hold - Supine Lower Trunk Rotation  - 1 x daily - 7 x weekly - 1 sets - 10 reps - 5 sec hold - Supine Bridge with Resistance Band  - 1 x daily - 7 x weekly - 2 sets - 10 reps - Clamshell  - 1 x daily - 7 x weekly - 2 sets - 10 reps   HEP rest of the day:   Access Code: [email or web address] URL: [email or web address] Date: 07/22/2022 Prepared by: Rivka Barbara  Exercises - Seated Long Arc Quad  - 1 x daily - 5 x weekly - 3 sets - 10 reps - 5 hold - Sit to Stand Without Arm Support  - 1 x daily - 5 x weekly - 2 sets - 8 reps - Romberg Stance with Eyes Closed  - 1 x daily - 5 x weekly - 1 sets - 3 reps - 30 hold - Tandem Stance  - 1 x daily - 5 x weekly - 1 sets - 3 reps - 30 hold - Standing Single Leg Stance with Counter Support  - 1 x daily - 5 x weekly - 1 sets - 3 reps - 30 hold - Seated Gaze Stabilization with Head Nod  - 1 x daily - 7 x weekly - 3 sets - 2 reps - 30 second hold - Seated Ankle Alphabet  - 1 x daily - 7 x weekly - 3 sets -  Ankle Eversion with Resistance  - 1 x daily - 7 x weekly - 2 sets - 10 reps - 5 sec  hold - Ankle Dorsiflexion with Resistance  - 1 x daily - 7 x weekly - 2 sets - 10 reps - 5 sec hold  GOALS: Goals reviewed with patient? Yes  SHORT TERM GOALS: Target date: 08/25/2022  Pt will be independent with initial HEP in order to build upon functional gains made in therapy. Baseline: 12/28: Pt reports "kind of on my own I have gone back to the beginning" regarding resuming his initial HEP and that he feels confident/indep performing it Goal status: ONGOING  2.  Patient will increase Dynamic Gait  Index to >19/24 as to demonstrate reduced falls risk and improved dynamic gait balance for better safety with community/home ambulation.  Baseline: 06/03/22: 12/24; 07/17/22: deferred Goal status: IN PROGRESS  3.  Pt will improve 5x sit<>stand to less than or equal to 14 sec to demonstrate improved functional strength and transfer efficiency.  Baseline: 15.63 seconds with no UE support; 12/28:  17 seconds hands free Goal status: IN PROGRESS  4.  Pt will ambulate at least 300' over level indoor/outdoor paved surfaces with supervision and LRAD in order to demo improved safety with mobility.  Baseline: No AD over level surfaces, supervision/CGA and unsteadiness.; 12/28: pt ambulates 149 ft with SPC, CGA prior to requiring rest break due to L glute spasm, performed over even surface Goal status: INITIAL  LONG TERM GOALS: Target date: 09/22/2022   Pt will be independent with final HEP in order to build upon functional gains made in therapy. Baseline: 07/17/22: pt reports performing initial HEP at the moment Goal status: IN PROGRESS  2.  Patient will increase dynamic gait index score to >19/24 as to demonstrate reduced fall risk and improved dynamic gait balance for better safety with community/home ambulation.  Baseline: 06/03/22: 12/24; 07/17/22: deferred 07/22/22: 15/24 Goal status: IN PROGRESS  3.  Pt will improve condition 4 of mCTSIB to at least 12 seconds in order to demo improved vestibular input for balance.  Baseline: 3 seconds, pt losing balance to R.; 12/28: pt able to maintain all 4 conditions without LOB, but does exhibit increased sway in condition 1 and 2 Goal status: MET  4.  Pt will improve gait speed with LRAD or appropriate braces/no AD to at least 3.3 ft/sec in order to demo improved community mobility.   Baseline: 11.16 seconds = 2.93 ft/sec with no AD; 12/28: 2.2 ft/sec with SPC (using due to unsteadiness s/p recent falls) Goal status: IN PROGRESS  5.  Pt will ambulate at  least 500' outdoors over paved/grass surfaces with LRAD and supervision in order to demo improved community mobility.  Baseline: 12/28: pt reports he does not think he could walk over uneven surfaces due to pain and unsteadiness Goal status: IN PROGRESS   ASSESSMENT:  CLINICAL IMPRESSION: ***      OBJECTIVE IMPAIRMENTS: Abnormal gait, decreased activity tolerance, decreased balance, decreased knowledge of use of DME, difficulty walking, decreased ROM, decreased strength, and impaired sensation.   ACTIVITY LIMITATIONS: bending, stairs, transfers, and locomotion level  PARTICIPATION LIMITATIONS: community activity and yard work  PERSONAL FACTORS: Past/current experiences, Time since onset of injury/illness/exacerbation, 3+ comorbidities: Arthritis, hx of pancreas cancer, depression, hx of subdural hematoma , hx of hip fracture ~13 yrs ago , and frequent falls (>10 in the past 6 months)  are also affecting patient's functional outcome.   REHAB POTENTIAL: Good  CLINICAL  DECISION MAKING: Evolving/moderate complexity  EVALUATION COMPLEXITY: Moderate  PLAN:  PT FREQUENCY: 1x/week  PT DURATION: 8 weeks  PLANNED INTERVENTIONS: Therapeutic exercises, Therapeutic activity, Neuromuscular re-education, Balance training, Gait training, Patient/Family education, Self Care, Stair training, Vestibular training, Orthotic/Fit training, DME instructions, and Re-evaluation  PLAN FOR NEXT SESSION:  discharge  [facility]  08/14/22, 5:11 PM

## 2022-08-18 ENCOUNTER — Ambulatory Visit: Payer: BC Managed Care – PPO

## 2022-08-20 ENCOUNTER — Ambulatory Visit: Payer: BC Managed Care – PPO

## 2022-08-21 ENCOUNTER — Emergency Department: Payer: BC Managed Care – PPO

## 2022-08-21 ENCOUNTER — Emergency Department
Admission: EM | Admit: 2022-08-21 | Discharge: 2022-08-21 | Disposition: A | Discharge status: Home / Self Care | Payer: BC Managed Care – PPO | Attending: Emergency Medicine | Admitting: Emergency Medicine

## 2022-08-21 ENCOUNTER — Encounter: Payer: Self-pay | Admitting: Emergency Medicine

## 2022-08-21 ENCOUNTER — Other Ambulatory Visit: Payer: Self-pay

## 2022-08-21 ENCOUNTER — Ambulatory Visit: Payer: BC Managed Care – PPO

## 2022-08-21 DIAGNOSIS — S0101XA Laceration without foreign body of scalp, initial encounter: Secondary | ICD-10-CM | POA: Diagnosis not present

## 2022-08-21 DIAGNOSIS — S161XXA Strain of muscle, fascia and tendon at neck level, initial encounter: Secondary | ICD-10-CM

## 2022-08-21 DIAGNOSIS — W01198A Fall on same level from slipping, tripping and stumbling with subsequent striking against other object, initial encounter: Secondary | ICD-10-CM | POA: Diagnosis not present

## 2022-08-21 DIAGNOSIS — S0990XA Unspecified injury of head, initial encounter: Secondary | ICD-10-CM

## 2022-08-21 NOTE — ED Provider Notes (Signed)
[facility] Provider Note    Event Date/Time   First MD Initiated Contact with Patient 08/21/22 1117     (approximate)   History   Fall   HPI  BLAKE VETRANO is a 67 y.o. male with history of hyperlipidemia, neuroendocrine disorder and as listed in EMR presents to the emergency department for evaluation of head injury, scalp laceration, and neck pain after mechanical and nonsyncopal fall this morning. He was leaning against a door frame and when he turned to go to another room he fell. He tried to catch himself but fell backward. He hit the back of his head and has a small scalp laceration. No loss of consciousness. He also has lateral right side neck soreness.       Physical Exam   Triage Vital Signs: ED Triage Vitals  Enc Vitals Group     BP 08/21/22 1102 (!) 149/86     Pulse Rate 08/21/22 1102 (!) 53     Resp 08/21/22 1102 18     Temp 08/21/22 1102 97.8 F (36.6 C)     Temp Source 08/21/22 1102 Oral     SpO2 08/21/22 1102 100 %     Weight 08/21/22 1103 223 lb 8.7 oz (101.4 kg)     Height 08/21/22 1103 '6\' 6"'$  (1.981 m)     Head Circumference --      Peak Flow --      Pain Score 08/21/22 1103 3     Pain Loc --      Pain Edu? --      Excl. in [place]? --     Most recent vital signs: Vitals:   08/21/22 1102  BP: (!) 149/86  Pulse: (!) 53  Resp: 18  Temp: 97.8 F (36.6 C)  SpO2: 100%    General: Awake, no distress.  CV:  Good peripheral perfusion.  Resp:  Normal effort.  Abd:  No distention.  Other:  2cm superficial scalp laceration   ED Results / Procedures / Treatments   Labs (all labs ordered are listed, but only abnormal results are displayed) Labs Reviewed - No data to display   EKG  Not indicated.   RADIOLOGY  CT had and cervical spine negative for new concerns.  PROCEDURES:  Critical Care performed: No  ..Laceration Repair  Date/Time: 08/21/2022 4:18 PM  Performed by: Victorino Dike, FNP Authorized by:  Victorino Dike, FNP   Consent:    Consent obtained:  Verbal   Consent given by:  Patient   Risks discussed:  Infection and poor cosmetic result Anesthesia:    Anesthesia method:  None Laceration details:    Location:  Scalp   Scalp location:  Crown   Length (cm):  2 Treatment:    Area cleansed with:  Povidone-iodine Skin repair:    Repair method:  Tissue adhesive Approximation:    Approximation:  Close Repair type:    Repair type:  Simple Post-procedure details:    Dressing:  Open (no dressing)   Procedure completion:  Tolerated well, no immediate complications    MEDICATIONS ORDERED IN ED:  Medications - No data to display   IMPRESSION / MDM / [place] / ED COURSE   I have reviewed the triage note.  Differential diagnosis includes, but is not limited to, ICH, scull fracture, scalp laceration, cervical vertebral injury, musculoskeletal pain.  Patient's presentation is most consistent with acute complicated illness / injury requiring diagnostic workup.  67 year old male presenting  to the emergency department for evaluation after mechanical, nonsyncopal fall at home.  See HPI for further details.  Exam is overall reassuring with the exception of a 2 cm superficial scalp laceration with scant amount of active bleeding.  Patient relays that his Tdap is current.  Plan is to get CT of the head and cervical spine and repair laceration.   CT scans are normal. Superficial scalp wound repaired as described above. Home care and ER return precautions discussed.     FINAL CLINICAL IMPRESSION(S) / ED DIAGNOSES   Final diagnoses:  Minor head injury, initial encounter  Acute strain of neck muscle, initial encounter  Superficial laceration of scalp, initial encounter     Rx / DC Orders   ED Discharge Orders     None        Note:  This document was prepared using Dragon voice recognition software and may include unintentional dictation errors.   Victorino Dike, FNP 08/21/22 1623    Vanessa [place], MD 08/21/22 [phone]

## 2022-08-21 NOTE — ED Triage Notes (Signed)
Pt states he is here with a fall today. Pt states he was leaning on a door frame talking to someone and then began to lean over and fall. Pt states he hit his tailbone and the back of his head. Pt has a small laceration to the back of his head. Pt states he is a high fall risk along with his memory loss.

## 2022-08-22 ENCOUNTER — Ambulatory Visit (INDEPENDENT_AMBULATORY_CARE_PROVIDER_SITE_OTHER): Payer: BC Managed Care – PPO | Admitting: Neurology

## 2022-08-22 ENCOUNTER — Ambulatory Visit: Payer: BC Managed Care – PPO | Admitting: Neurology

## 2022-08-22 VITALS — BP 136/83 | HR 58 | Ht 78.0 in | Wt 223.0 lb

## 2022-08-22 DIAGNOSIS — R269 Unspecified abnormalities of gait and mobility: Secondary | ICD-10-CM | POA: Insufficient documentation

## 2022-08-22 DIAGNOSIS — G629 Polyneuropathy, unspecified: Secondary | ICD-10-CM | POA: Diagnosis not present

## 2022-08-22 DIAGNOSIS — R413 Other amnesia: Secondary | ICD-10-CM

## 2022-08-22 NOTE — Procedures (Signed)
Full Name: Jared [name] Gender: Male MRN #: [id number] Date of Birth: March 13, 1956    Visit Date: 08/22/2022 08:30 Age: 67 Years Examining Physician: Dr. Marcial Pacas Referring Physician: Dr. Andrey Spearman Height: 6 feet 6 inch History: 67 year old male, with history of gradual onset bilateral foot coldness, numbness, stiffness, gait abnormality  Summary of the test: Nerve conduction study: Bilateral sural, superficial peroneal sensory responses were absent.  Bilateral tibial, left peroneal to EDB motor responses were absent.  Right median sensory response was absent.  Left median sensory response showed mildly prolonged peak latency, with significantly decreased snap amplitude.  Left ulnar sensory response showed mildly prolonged peak latency with slightly decreased snap amplitude.  Bilateral ulnar, median motor responses were normal.  Bilateral ulnar F-wave latency was within normal limit.  Electromyography: Selected needle examination were performed at bilateral lower extremity muscles, bilateral lumbosacral paraspinal muscles; left upper extremity muscles and left cervical paraspinal muscles.  There is evidence of active neuropathic changes involving bilateral distal leg muscles. There is no spontaneous activity at bilateral lumbosacral paraspinal muscles. There was no significant abnormality at selected needle examination of left upper extremity muscles and left cervical paraspinal muscles.   Conclusion: This is an abnormal study.  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  In addition, there is also evidence of mild bilateral median neuropathy across the wrist, right worse than left.    ------------------------------- Marcial Pacas  M.D. Ph.D.  [facility] [address], [place], [place] [address] Tel: [phone] Fax: [phone]  Verbal informed consent was obtained from the patient, patient was  informed of potential risk of procedure, including bruising, bleeding, hematoma formation, infection, muscle weakness, muscle pain, numbness, among others.        [place]    Nerve / Sites Muscle Latency Ref. Amplitude Ref. Rel Amp Segments Distance Velocity Ref. Area    ms ms mV mV %  cm m/s m/s mVms  L Median - APB     Wrist APB 3.9 ?4.4 4.9 ?4.0 100 Wrist - APB 7   25.5     Upper arm APB 9.1  5.2  106 Upper arm - Wrist 27 51 ?49 24.4  R Median - APB     Wrist APB 3.8 ?4.4 4.6 ?4.0 100 Wrist - APB 7   17.5     Upper arm APB 9.5  3.8  83.1 Upper arm - Wrist 27 48 ?49 16.3  L Ulnar - ADM     Wrist ADM 2.9 ?3.3 11.0 ?6.0 100 Wrist - ADM 7   38.8     B.Elbow ADM 5.8  11.1  101 B.Elbow - Wrist 17 59 ?49 39.2     A.Elbow ADM 9.4  10.1  91.6 A.Elbow - B.Elbow 19 52 ?49 37.9  R Ulnar - ADM     Wrist ADM 2.6 ?3.3 11.8 ?6.0 100 Wrist - ADM 7   33.2     B.Elbow ADM 5.7  11.3  95.5 B.Elbow - Wrist 16 51 ?49 34.5     A.Elbow ADM 9.0  10.3  91.1 A.Elbow - B.Elbow 21 64 ?49 32.1  L Peroneal - EDB     Ankle EDB NR ?6.5 NR ?2.0 NR Ankle - EDB 9   NR         Pop fossa - Ankle      L Tibial - AH     Ankle AH NR ?5.8 NR ?4.0 NR Ankle -  AH 9   NR  R Tibial - AH     Ankle AH NR ?5.8 NR ?4.0 NR Ankle - AH 9   NR                   SNC    Nerve / Sites Rec. Site Peak Lat Ref.  Amp Ref. Segments Distance    ms ms V V  cm  L Sural - Ankle (Calf)     Calf Ankle NR ?4.4 NR ?6 Calf - Ankle 14  R Sural - Ankle (Calf)     Calf Ankle NR ?4.4 NR ?6 Calf - Ankle 14  L Superficial peroneal - Ankle     Lat leg Ankle NR ?4.4 NR ?6 Lat leg - Ankle 14  R Superficial peroneal - Ankle     Lat leg Ankle NR ?4.4 NR ?6 Lat leg - Ankle 14  L Median - Orthodromic (Dig II, Mid palm)     Dig II Wrist 3.9 ?3.4 3 ?10 Dig II - Wrist 13  R Median - Orthodromic (Dig II, Mid palm)     Dig II Wrist NR ?3.4 NR ?10 Dig II - Wrist 13  L Ulnar - Orthodromic, (Dig V, Mid palm)     Dig V Wrist 3.2 ?3.1 4 ?5 Dig V - Wrist 5                      F  Wave    Nerve F Lat Ref.   ms ms  L Ulnar - ADM 34.8 ?32.0  R Ulnar - ADM 31.7 ?32.0         EMG Summary Table    Spontaneous MUAP Recruitment  Muscle IA Fib PSW Fasc Other Amp Dur. Poly Pattern  R. Tibialis anterior Increased 1+ None None _______ Increased Increased 1+ Reduced  R. Tibialis posterior Increased 1+ None None _______ Increased Increased 1+ Reduced  R. Peroneus longus Increased 1+ None None _______ Increased Increased 1+ Reduced  R. Gastrocnemius (Medial head) Increased 1+ None None _______ Increased Increased 1+ Reduced  R. Vastus lateralis Normal None None None _______ Normal Normal Normal Reduced  L. Tibialis anterior Increased 1+ None None _______ Increased Increased 1+ Reduced  L. Tibialis posterior Increased 1+ None None _______ Increased Increased 1+ Reduced  L. Peroneus longus Increased 1+ None None _______ Increased Increased 1+ Reduced  L. Gastrocnemius (Medial head) Increased 1+ None None _______ Increased Increased 1+ Reduced  L. Vastus lateralis Normal None None None _______ Normal Normal Normal Reduced  L. Biceps femoris (short head) Normal None None None _______ Normal Normal Normal Reduced  L. Biceps femoris (long head) Normal None None None _______ Normal Normal Normal Normal  R. Lumbar paraspinals (low) Normal None None None _______ Normal Normal Normal Normal  R. Lumbar paraspinals (mid) Normal None None None _______ Normal Normal Normal Normal  L. Lumbar paraspinals (low) Normal None None None _______ Normal Normal Normal Normal  L. Lumbar paraspinals (mid) Normal None None None _______ Normal Normal Normal Normal  L. First dorsal interosseous Normal None None None _______ Normal Normal Normal Normal  L. Extensor digitorum communis Normal None None None _______ Normal Normal Normal Normal  L. Biceps brachii Normal None None None _______ Normal Normal Normal Normal  L. Deltoid Normal None None None _______ Normal Normal Normal Normal  L.  Triceps brachii Normal None None None _______ Normal Normal Normal Normal  L. Cervical paraspinals Normal None None  None _______ Normal Normal Normal Normal

## 2022-08-22 NOTE — Progress Notes (Signed)
Chief Complaint  Patient presents with   Procedure    Rm EMG/NCV 3.      ASSESSMENT AND PLAN  URA HAUSEN is a 67 y.o. male   Slow worsening, symmetric length dependent bilateral lower extremity sensory loss, distal weakness, Worsening gait abnormality  EMG nerve conduction study today showed length-dependent severe axonal sensorimotor polyneuropathy  Extensive laboratory evaluation showed mild elevated A1c 6.2, no other treatable etiology found  Complete more extensive laboratory evaluation including heavy metal screening, paraneoplastic panel  Will for him to biomed for bilateral ankle brace  He is receiving physical therapy, encourage him moderate exercise,  DIAGNOSTIC DATA (LABS, IMAGING, TESTING) - I reviewed patient records, labs, notes, testing and imaging myself where available.   MEDICAL HISTORY:  DERYCK HIPPLER is a 67 year old male, seen in request by Dr. Leta Baptist for electrodiagnostic study for evaluation of bilateral lower extremity weakness, gait abnormality, his primary care physician is from [facility] Dr.   Lovie Macadamia,   I reviewed and summarized the referring note. PMHX Depression,  Hx of pancreatic cancer 2009, was treated at [facility], (neuroendocrine tumor, surgery only) History of kidney stone History of subdural hematoma,  Frequent fall History of smoke, 1ppd, quit in 2021.  He still works full-time as a Geophysicist/field seismologist, desk job since 2009, around 2019, he noticed gradual onset bilateral foot numbness, stiffness, cold sensation, difficulty bending his toes, symptoms gradually getting worse, since 2021, he noticed gradual worsening gait abnormality, while walking from parking lot to his desk, he often felt unsteady, fell multiple times, about once a week,  [place] again yesterday August 21, 2022, presented to [facility], personally reviewed CT head no acute abnormality, CT cervical spine mild degenerative changes, most advanced at C5-6, C6-7, mild to  moderate canal stenosis at C5-6, variable degree of foraminal narrowing  He had a history of incidental finding of pancreatic tumor, had surgery at [facility], it was deemed to be benign neuroendocrine tumor, had surgery only, no chemo or radiation therapy, followed up by imaging study, that is stable  He also reported gradual onset bilateral hearing loss, he denies family history of neuromuscular disease  The most bothersome symptoms for him is bilateral foot stiffness, unsteady gait, cold feet, he denies significant pain, denies bilateral upper extremity paresthesia, mild midline low back pain, denies neck pain, denies bowel or bladder incontinence, denies autonomic symptoms  He was seen by outside neurologist Dr. Manuella Ghazi in 2021 had MRI of the brain without contrast in September 2021, diffuse dural thickening, 2 to 3 mm thickness, posterior and supratentorium fossa,  MRA of head and neck showed no large vessel disease,  He had more extensive evaluation recently, personally reviewed MRI of lumbar spine from November 2023, multilevel degenerative changes, but there was no significant canal stenosis, variable degree of foraminal narrowing, most noticeable at L4-5, L5-S1, with potential L4 V nerve root compression  MRI of the brain, supratentorial and infratentorial pachymeningeal is thickened, with homogeneous enhancement, similar to previous MRI in September 2021,  CT of cervical spine no acute fracture, multilevel mild degenerative changes, mild 20 moderate canal stenosis C5-6, at least moderate foraminal narrowing C5-6 C6-7  CT head without contrast showed no acute abnormality,  Laboratory  evaluation November 2023, normal negative ANA, Sjogren, aldolase, CPK multiple sclerosis panel, TSH, B12, A1c was mildly elevated 6.2, positive ANA, with titer of 1-80, speckled pattern   PHYSICAL EXAM:   Vitals:   08/22/22 0817  BP: 136/83  Pulse: (!) 58  Weight: 223 lb (101.2 kg)  Height: '6\' 6"'$  (1.981 m)      Body mass index is 25.77 kg/m.  PHYSICAL EXAMNIATION:  Gen: NAD, conversant, well nourised, well groomed                     Cardiovascular: Regular rate rhythm, no peripheral edema, warm, nontender. Eyes: Conjunctivae clear without exudates or hemorrhage Neck: Supple, no carotid bruits. Pulmonary: Clear to auscultation bilaterally   NEUROLOGICAL EXAM:  MENTAL STATUS: Speech/cognition: Awake, alert, oriented to history taking and casual conversation CRANIAL NERVES: CN II: Visual fields are full to confrontation. Pupils are round equal and briskly reactive to light. CN III, IV, VI: extraocular movement are normal. No ptosis. CN V: Facial sensation is intact to light touch CN VII: Face is symmetric with normal eye closure  CN VIII: Hard of hearing on casual conversation CN IX, X: Phonation is normal. CN XI: Head turning and shoulder shrug are intact  MOTOR: Bilateral upper extremity and proximal lower extremity motor strength is normal.  Mild discoloration of bilateral feet, moderate bilateral ankle dorsiflexion weakness, mild ankle plantarflexion weakness   REFLEXES: Reflexes are 2+ and symmetric at the biceps, triceps, knees, and absent at ankle ankles. Plantar responses are mute  SENSORY: Length-dependent decreased vibratory sensation to midfoot, pinprick to ankle level, preserved toe proprioception,  COORDINATION: There is no trunk or limb dysmetria noted.  GAIT/STANCE: Need push-up to get up from seated position, unsteady, cautious, able to stand up on tiptoe, not heels,  REVIEW OF SYSTEMS:  Full 14 system review of systems performed and notable only for as above All other review of systems were negative.   ALLERGIES: No Known Allergies  HOME MEDICATIONS: Current Outpatient Medications  Medication Sig Dispense Refill   buPROPion (WELLBUTRIN XL) 150 MG 24 hr tablet Take 3 tablets (450 mg total) by mouth daily. 90 tablet 2   escitalopram (LEXAPRO) 20 MG  tablet Take 1 tablet (20 mg total) by mouth daily. 30 tablet 2   ibuprofen (ADVIL,MOTRIN) 200 MG tablet Take 400 mg by mouth every 6 (six) hours as needed for mild pain.     Meth-Hyo-M Bl-Na Phos-Ph Sal (URIBEL) 118 MG CAPS Take by mouth.     methocarbamol (ROBAXIN) 500 MG tablet Take 250 mg by mouth at bedtime as needed for muscle spasms.     Methylcellulose, Laxative, 500 MG TABS Take by mouth.     Multiple Vitamins-Minerals (CENTRUM SILVER 50+MEN PO) Take 1 tablet by mouth daily.     tadalafil (CIALIS) 10 MG tablet Take 0.5 tablets (5 mg total) by mouth daily as needed for erectile dysfunction. 30 tablet 11   No current facility-administered medications for this visit.    PAST MEDICAL HISTORY: Past Medical History:  Diagnosis Date   Arthritis    Cancer ([place])    pancreas   Complication of anesthesia    tends to come out of anesthesia during procedure   Depression    Dysphagia    Elevated lipids    GERD (gastroesophageal reflux disease)    H/O blood clots    superficial blood clot in left leg below knee tx w/ eliquis x 3 mo   History of kidney stones    Kidney disease    Neuroendocrine tumor    Subdural hematoma (HCC)    hx of   Tobacco abuse    Unsteadiness     PAST SURGICAL HISTORY: Past Surgical History:  Procedure Laterality Date   ABDOMINAL  SURGERY     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   CHOLECYSTECTOMY     added umbilical mesh   COLONOSCOPY WITH PROPOFOL N/A 03/27/2017   Procedure: COLONOSCOPY WITH PROPOFOL;  Surgeon: Manya Silvas, MD;  Location[facility] ENDOSCOPY;  Service: Endoscopy;  Laterality: N/A;   ESOPHAGOGASTRODUODENOSCOPY (EGD) WITH PROPOFOL N/A 03/27/2017   Procedure: ESOPHAGOGASTRODUODENOSCOPY (EGD) WITH PROPOFOL;  Surgeon: Manya Silvas, MD;  Location: [facility] ENDOSCOPY;  Service: Endoscopy;  Laterality: N/A;   hip fracture with rod placement Right    MASS EXCISION Left 05/24/2021   Procedure: EXCISION MASS;  Surgeon: Benjamine Sprague, DO;   Location: [facility] ORS;  Service: General;  Laterality: Left;  Prone postition    FAMILY HISTORY: Family History  Problem Relation Age of Onset   Cancer Mother    Depression Mother    Stroke Father    Depression Father    Aneurysm Father    Depression Sister    Depression Sister    Cancer Brother    Depression Brother     SOCIAL HISTORY: Social History   Socioeconomic History   Marital status: Married    Spouse name: Not on file   Number of children: 2   Years of education: Not on file   Highest education level: Associate degree: occupational, Hotel manager, or vocational program  Occupational History   Not on file  Tobacco Use   Smoking status: Former    Packs/day: 1.00    Years: 45.00    Total pack years: 45.00    Types: Cigarettes    Quit date: 01/30/2019    Years since quitting: 3.5   Smokeless tobacco: Never  Vaping Use   Vaping Use: Former  Substance and Sexual Activity   Alcohol use: No   Drug use: No   Sexual activity: Yes    Birth control/protection: None  Other Topics Concern   Not on file  Social History Narrative   Lives with wife   Social Determinants of Health   Financial Resource Strain: Not on file  Food Insecurity: Not on file  Transportation Needs: Not on file  Physical Activity: Not on file  Stress: Not on file  Social Connections: Not on file  Intimate Partner Violence: Not on file      Marcial Pacas, M.D. Ph.D.  [facility] [address], [place], [place] [address] Ph: [phone] Fax: [phone]  CC:  [name], MD [address] [place],  [place] [address]  [name], MD

## 2022-08-25 ENCOUNTER — Ambulatory Visit: Payer: BC Managed Care – PPO

## 2022-08-25 NOTE — Progress Notes (Unsigned)
Virtual Visit via Video Note  I connected with Jared [name] on 08/27/22 at  8:20 AM EST by a video enabled telemedicine application and verified that I am speaking with the correct person using two identifiers.  Location: Patient: work Provider: office Persons participated in the visit- patient, provider    I discussed the limitations of evaluation and management by telemedicine and the availability of in person appointments. The patient expressed understanding and agreed to proceed.    I discussed the assessment and treatment plan with the patient. The patient was provided an opportunity to ask questions and all were answered. The patient agreed with the plan and demonstrated an understanding of the instructions.   The patient was advised to call back or seek an in-person evaluation if the symptoms worsen or if the condition fails to improve as anticipated.  I provided 15 minutes of non-face-to-face time during this encounter.   Norman Clay, MD    [facility] MD/PA/NP OP Progress Note  08/27/2022 8:45 AM Jared [name]  MRN:  [id number]  Chief Complaint:  Chief Complaint  Patient presents with   Follow-up   HPI:  - he was seen by neurology for evaluation of bilateral lower extremity weakness, gait abnormality    "EMG nerve conduction study today showed length-dependent severe axonal sensorimotor polyneuropathy             Extensive laboratory evaluation showed mild elevated A1c 6.2, no other treatable etiology found             Complete more extensive laboratory evaluation including heavy metal screening, paraneoplastic panel             Will for him to biomed for bilateral ankle brace             He is receiving physical therapy, encourage him moderate exercise"  This is a follow-up appointment for depression and anxiety.  He states that he has been doing well mentally.  However, he fell 3 times since Thanksgiving.  He hit his head on the floor at times.  He is under  evaluation by neurologist.  He was told he has neuropathy.  He sees PT. He experiences dizziness when changing his head position.  He sleeps up to 8 hours.  He occasionally feels fatigued.  He denies feeling depressed or anxiety.  He denies SI.  He lowered the dose of bupropion.  Although he was doing good initially, he felt zoned out afterwards.  This has been better since lowering the dose.  He thinks he is more coherent, although he occasionally has memory issues.  He feels comfortable to stay on the current medication regimen at this time.    Daily routine: work Support: Household: wife, mother in [employer] Marital status: married for 29 years Number of children: 2 (66 son in [place], 30 daughter in [place]) Employment: system admin Education:   Last PCP / ongoing medical evaluation  Wt Readings from Last 3 Encounters:  08/22/22 223 lb (101.2 kg)  08/21/22 223 lb 8.7 oz (101.4 kg)  05/19/22 223 lb 8 oz (101.4 kg)    05/19/22 223 lb 8 oz (101.4 kg)  01/02/22 235 lb (106.6 kg)  10/09/21 234 lb (106.1 kg)      Visit Diagnosis:    ICD-10-CM   1. MDD (major depressive disorder), recurrent, in partial remission ([place])  F33.41     2. Anxiety state  F41.1     3. Fatigue, unspecified type  R53.83  Past Psychiatric History: Please see initial evaluation for full details. I have reviewed the history. No updates at this time.     Past Medical History:  Past Medical History:  Diagnosis Date   Arthritis    Cancer ([place])    pancreas   Complication of anesthesia    tends to come out of anesthesia during procedure   Depression    Dysphagia    Elevated lipids    GERD (gastroesophageal reflux disease)    H/O blood clots    superficial blood clot in left leg below knee tx w/ eliquis x 3 mo   History of kidney stones    Kidney disease    Neuroendocrine tumor    Subdural hematoma (HCC)    hx of   Tobacco abuse    Unsteadiness     Past Surgical History:  Procedure Laterality Date    ABDOMINAL SURGERY     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   CHOLECYSTECTOMY     added umbilical mesh   COLONOSCOPY WITH PROPOFOL N/A 03/27/2017   Procedure: COLONOSCOPY WITH PROPOFOL;  Surgeon: Manya Silvas, MD;  [facility] ENDOSCOPY;  Service: Endoscopy;  Laterality: N/A;   ESOPHAGOGASTRODUODENOSCOPY (EGD) WITH PROPOFOL N/A 03/27/2017   Procedure: ESOPHAGOGASTRODUODENOSCOPY (EGD) WITH PROPOFOL;  Surgeon: Manya Silvas, MD;  Location: [facility] ENDOSCOPY;  Service: Endoscopy;  Laterality: N/A;   hip fracture with rod placement Right    MASS EXCISION Left 05/24/2021   Procedure: EXCISION MASS;  Surgeon: Benjamine Sprague, DO;  Location: [facility] ORS;  Service: General;  Laterality: Left;  Prone postition    Family Psychiatric History: Please see initial evaluation for full details. I have reviewed the history. No updates at this time.     Family History:  Family History  Problem Relation Age of Onset   Cancer Mother    Depression Mother    Stroke Father    Depression Father    Aneurysm Father    Depression Sister    Depression Sister    Cancer Brother    Depression Brother     Social History:  Social History   Socioeconomic History   Marital status: Married    Spouse name: Not on file   Number of children: 2   Years of education: Not on file   Highest education level: Associate degree: occupational, Hotel manager, or vocational program  Occupational History   Not on file  Tobacco Use   Smoking status: Former    Packs/day: 1.00    Years: 45.00    Total pack years: 45.00    Types: Cigarettes    Quit date: 01/30/2019    Years since quitting: 3.5   Smokeless tobacco: Never  Vaping Use   Vaping Use: Former  Substance and Sexual Activity   Alcohol use: No   Drug use: No   Sexual activity: Yes    Birth control/protection: None  Other Topics Concern   Not on file  Social History Narrative   Lives with wife   Social Determinants of Health   Financial Resource  Strain: Not on file  Food Insecurity: Not on file  Transportation Needs: Not on file  Physical Activity: Not on file  Stress: Not on file  Social Connections: Not on file    Allergies: No Known Allergies  Metabolic Disorder Labs: Lab Results  Component Value Date   HGBA1C 6.2 (H) 05/19/2022   No results found for: "PROLACTIN" No results found for: "CHOL", "TRIG", "HDL", "CHOLHDL", "VLDL", "[place]" Lab Results  Component Value Date   TSH 0.850 05/19/2022    Therapeutic Level Labs: No results found for: "LITHIUM" No results found for: "VALPROATE" No results found for: "CBMZ"  Current Medications: Current Outpatient Medications  Medication Sig Dispense Refill   buPROPion (WELLBUTRIN XL) 150 MG 24 hr tablet Take 3 tablets (450 mg total) by mouth daily. 90 tablet 2   [START ON 09/02/2022] escitalopram (LEXAPRO) 20 MG tablet Take 1 tablet (20 mg total) by mouth daily. 30 tablet 2   ibuprofen (ADVIL,MOTRIN) 200 MG tablet Take 400 mg by mouth every 6 (six) hours as needed for mild pain.     Meth-Hyo-M Bl-Na Phos-Ph Sal (URIBEL) 118 MG CAPS Take by mouth.     methocarbamol (ROBAXIN) 500 MG tablet Take 250 mg by mouth at bedtime as needed for muscle spasms.     Methylcellulose, Laxative, 500 MG TABS Take by mouth.     Multiple Vitamins-Minerals (CENTRUM SILVER 50+MEN PO) Take 1 tablet by mouth daily.     tadalafil (CIALIS) 10 MG tablet Take 0.5 tablets (5 mg total) by mouth daily as needed for erectile dysfunction. 30 tablet 11   No current facility-administered medications for this visit.     Musculoskeletal: Strength & Muscle Tone:  N/A Gait & Station:  N/A Patient leans: N/A  Psychiatric Specialty Exam: Review of Systems  Psychiatric/Behavioral:  Negative for agitation, behavioral problems, confusion, decreased concentration, dysphoric mood, hallucinations, self-injury, sleep disturbance and suicidal ideas. The patient is not nervous/anxious and is not hyperactive.   All  other systems reviewed and are negative.   There were no vitals taken for this visit.There is no height or weight on file to calculate BMI.  General Appearance: Fairly Groomed  Eye Contact:  Good  Speech:  Clear and Coherent  Volume:  Normal  Mood:   good  Affect:  Appropriate and Congruent  Thought Process:  Coherent  Orientation:  Full (Time, Place, and Person)  Thought Content: Logical   Suicidal Thoughts:  No  Homicidal Thoughts:  No  Memory:  Immediate;   Good  Judgement:  Good  Insight:  Good  Psychomotor Activity:  Normal  Concentration:  Concentration: Good and Attention Span: Good  Recall:  Good  Fund of Knowledge: Good  Language: Good  Akathisia:  No  Handed:  Right  AIMS (if indicated): not done  Assets:  Communication Skills Desire for Improvement  ADL's:  Intact  Cognition: WNL  Sleep:  Good   Screenings: Mini-Mental    Flowsheet Row Office Visit from 05/19/2022 in [facility]  Total Score (max 30 points ) 27      PHQ2-9    Flowsheet Row Counselor from 06/09/2022 in [place] [facility] [place] from 09/18/2021 in [place] Office Visit from 08/19/2021 in [place]  PHQ-2 Total Score '1 4 4  '$ PHQ-9 Total Score -- 12 [place] [facility] from 08/21/2022 in [facility] [place] from 01/01/2022 in [place] Counselor from 09/18/2021 in [place] No Risk No Risk No Risk        Assessment and Plan:  Jared [name] is a 67 y.o. year old male with a history of depression, arthritis, GERD, pancreatic neuroendocrine tumor s/p excision, who presents for follow up appointment for below.   1. MDD (major  depressive disorder), recurrent, in partial remission ([place]) 2.  Anxiety state Acute stressors include: falls, undergoing evaluation  Other stressors include:    History: had more than 2 episodes of depression in the past  There has been overall improvement in depressive symptoms, anxiety and anhedonia since the last visit.  He had adverse reaction of feeling zone out from higher dose of bupropion, and has self tapered his medication.  Will continue current dose of bupropion to target depression, and Lexapro to target depression and anxiety.  He will need maintenance treatment for depression due to history in the past.   3. Fatigue, unspecified type Overall improving.  He has an upcoming appointment for sleep evaluation.    # cognitive impairment Slight improvement since lowering the dose of bupropion. He reports occasional difficulty in remembering names of people.  IADL independent.  Noted that he was evaluated by his neurologist symptoms in 2021 with brain MRI, and labs, which are unremarkable to explain his symptoms.  Will continue to monitor this.    Plan Continue bupropion 300 mg daily (self tapered down from 450 mg, felt zombie at higher dose) - he declined a refill Continue escitalopram 20 mg daily (QTc 412 msec in 04/2021, HR 52 sinus bradycardia)  Next appointment: 5/8 at 8:20, video [email or web address]   The patient demonstrates the following risk factors for suicide: Chronic risk factors for suicide include: psychiatric disorder of depression . Acute risk factors for suicide include: family or marital conflict. Protective factors for this patient include: hope for the future and religious beliefs against suicide. Considering these factors, the overall suicide risk at this point appears to be low. Patient is appropriate for outpatient follow up.         Collaboration of Care: Collaboration of Care: Other reviewed notes in Epic  Patient/Guardian was advised Release of Information must be obtained prior to any record release in order to  collaborate their care with an outside provider. Patient/Guardian was advised if they have not already done so to contact the registration department to sign all necessary forms in order for [place] to release information regarding their care.   Consent: Patient/Guardian gives verbal consent for treatment and assignment of benefits for services provided during this visit. Patient/Guardian expressed understanding and agreed to proceed.    Norman Clay, MD 08/27/2022, 8:45 AM

## 2022-08-27 ENCOUNTER — Telehealth (INDEPENDENT_AMBULATORY_CARE_PROVIDER_SITE_OTHER): Payer: BC Managed Care – PPO | Admitting: Psychiatry

## 2022-08-27 ENCOUNTER — Encounter: Payer: Self-pay | Admitting: Psychiatry

## 2022-08-27 ENCOUNTER — Ambulatory Visit: Payer: BC Managed Care – PPO | Attending: Diagnostic Neuroimaging

## 2022-08-27 DIAGNOSIS — R296 Repeated falls: Secondary | ICD-10-CM

## 2022-08-27 DIAGNOSIS — F3341 Major depressive disorder, recurrent, in partial remission: Secondary | ICD-10-CM

## 2022-08-27 DIAGNOSIS — M542 Cervicalgia: Secondary | ICD-10-CM | POA: Diagnosis present

## 2022-08-27 DIAGNOSIS — R2681 Unsteadiness on feet: Secondary | ICD-10-CM

## 2022-08-27 DIAGNOSIS — R5383 Other fatigue: Secondary | ICD-10-CM

## 2022-08-27 DIAGNOSIS — F411 Generalized anxiety disorder: Secondary | ICD-10-CM

## 2022-08-27 DIAGNOSIS — R262 Difficulty in walking, not elsewhere classified: Secondary | ICD-10-CM

## 2022-08-27 DIAGNOSIS — R42 Dizziness and giddiness: Secondary | ICD-10-CM | POA: Diagnosis present

## 2022-08-27 DIAGNOSIS — M5459 Other low back pain: Secondary | ICD-10-CM | POA: Diagnosis present

## 2022-08-27 DIAGNOSIS — M6281 Muscle weakness (generalized): Secondary | ICD-10-CM | POA: Diagnosis present

## 2022-08-27 MED ORDER — ESCITALOPRAM OXALATE 20 MG PO TABS: 20.0000 mg | ORAL_TABLET | Freq: Every day | ORAL | 2 refills | Status: DC

## 2022-08-27 NOTE — Therapy (Signed)
OUTPATIENT PHYSICAL THERAPY NEURO TREATMENT   Patient Name: Jared [name] MRN: [id number] DOB:1956-06-17, 67 y.o., male Today's Date: 08/27/2022  PCP:  [name], MD  REFERRING PROVIDER: [name], MD  PT End of Session - 08/27/22 1515     Visit Number 15    Number of Visits 20    Date for PT Re-Evaluation 09/22/22    Authorization Type [employer]    Progress Note Due on Visit 20    PT Start Time 1515    PT Stop Time 1558    PT Time Calculation (min) 43 min    Equipment Utilized During Treatment Gait belt    Activity Tolerance Patient tolerated treatment well;Other (comment)   pt required prolonged rest break (monitored by SPT and another PT) following session due to increased dizziness symptoms, improved with rest   Behavior During Therapy Impulsive;WFL for tasks assessed/performed                   Past Medical History:  Diagnosis Date   Arthritis    Cancer ([place])    pancreas   Complication of anesthesia    tends to come out of anesthesia during procedure   Depression    Dysphagia    Elevated lipids    GERD (gastroesophageal reflux disease)    H/O blood clots    superficial blood clot in left leg below knee tx w/ eliquis x 3 mo   History of kidney stones    Kidney disease    Neuroendocrine tumor    Subdural hematoma ([facility])    hx of   Tobacco abuse    Unsteadiness    Past Surgical History:  Procedure Laterality Date   ABDOMINAL SURGERY     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   CHOLECYSTECTOMY     added umbilical mesh   COLONOSCOPY WITH PROPOFOL N/A 03/27/2017   Procedure: COLONOSCOPY WITH PROPOFOL;  Surgeon: Manya Silvas, MD;  Location: [facility] ENDOSCOPY;  Service: Endoscopy;  Laterality: N/A;   ESOPHAGOGASTRODUODENOSCOPY (EGD) WITH PROPOFOL N/A 03/27/2017   Procedure: ESOPHAGOGASTRODUODENOSCOPY (EGD) WITH PROPOFOL;  Surgeon: Manya Silvas, MD;  Location: [facility] ENDOSCOPY;  Service: Endoscopy;  Laterality: N/A;   hip fracture  with rod placement Right    MASS EXCISION Left 05/24/2021   Procedure: EXCISION MASS;  Surgeon: Benjamine Sprague, DO;  Location: [facility] ORS;  Service: General;  Laterality: Left;  Prone postition   Patient Active Problem List   Diagnosis Date Noted   Neuropathy 08/22/2022   Gait abnormality 08/22/2022   AAA (abdominal aortic aneurysm) without rupture ([place]) 01/02/2022   RLS (restless legs syndrome) 09/19/2020   Varicose veins of both lower extremities 09/19/2020   TIA (transient ischemic attack) 01/01/2020   History of TIA (transient ischemic attack) 12/27/2019   PVC's (premature ventricular contractions) 12/27/2019   Pill dysphagia 03/17/2017   Hx of colonic polyps 12/05/2016   Irregular heart rate 12/05/2016   Hyperlipidemia 09/07/2015   Tobacco abuse 09/07/2015   Calculus, kidney 04/02/2015   Neuroendocrine tumor 06/22/2008   ONSET DATE: 05/19/2022  REFERRING DIAG: R26.9 (ICD-10-CM) - Gait difficulty R41.3 (ICD-10-CM) - Memory loss  THERAPY DIAG:  Other low back pain  Muscle weakness (generalized)  Unsteadiness on feet  Difficulty in walking, not elsewhere classified  Dizziness and giddiness  Repeated falls  Rationale for Evaluation and Treatment: Rehabilitation  SUBJECTIVE:  SUBJECTIVE STATEMENT:  Patient reports he fell Thursday, backwards hitting his head. Went to ED, was cleared.   Pt accompanied by: self  PERTINENT HISTORY:  67 year old male here for evaluation of gait and balance difficulty and memory loss. Symptoms have been present since 2021. He has developed numbness in his feet, gait and balance difficulty. Has difficulty in the shower when he closes eyes. He feels like he is drunk when he walks. He tends to lose his balance. He is fallen 10 times last few months.  PMH:  Arthritis, hx of pancreas cancer, depression, hx of subdural hematoma , hx of hip fracture ~13 yrs ago   PAIN:  Are you having pain? No  PRECAUTIONS: Fall  WEIGHT BEARING RESTRICTIONS: No  FALLS: Has patient fallen in last 6 months? Yes. Number of falls >10 falls   LIVING ENVIRONMENT: Lives with: lives with their spouse and and wife's mother Lives in: House/apartment Stairs: No Has following equipment at home: Single point cane and Grab bars - has suction cup grab bars   PLOF: Independent  PATIENT GOALS: Wants to improve balance.   OBJECTIVE:   DIAGNOSTIC FINDINGS:  03/27/20 MRI BRAIN 1. Positive for diffuse bilateral subdural hematoma versus dural thickening, 2-3 mm in thickness throughout both the entire posterior fossa and bilateral supratentorial brain. Follow-up noncontrast Head CT might be able to confirm hyperdense blood products, although small SDH this size will frequently be occult by CT. Follow-up Post-contrast brain MRI would be valuable although a degree of dural thickening is typically seen with SDH. Otherwise CSF analysis might also be valuable. 2. No significant intracranial mass effect or other acute intracranial abnormality. 3. MRAs today are reported separately.  Brain and lumbar spine MRI scheduled for 06/09/22  IMPRESSION: This MRI of the lumbar spine without contrast shows the following: 1. At L4-L5, there is severe loss of disc height and other degenerative change causing moderately severe foraminal narrowing and moderate lateral recess stenosis.  There is potential for L4 nerve root compression.  There is no spinal stenosis. 2. At L5-S1, there are degenerative changes causing moderately severe foraminal narrowing and moderate lateral recess stenosis.  There is potential for L5 nerve root compression to either side.  There is no spinal stenosis. 3. Milder degenerative changes at the other lumbar levels do not lead to spinal stenosis or nerve root  compression.   COGNITION: Overall cognitive status: Within functional limits for tasks assessed, however pt reporting some memory issues.    SENSATION: Light touch: Reports numbness/tingling in feet and in thighs, with light touch pt more impaired distally in RLE  Proprioception: WFL at bilat ankles   COORDINATION: Heel to shin: WFL bilat   LOWER EXTREMITY MMT:    MMT Right Eval Left Eval  Hip flexion 4/5 4-/5  Hip extension    Hip abduction 5/5 5/5  Hip adduction 5/5 5/5  Hip internal rotation    Hip external rotation    Knee flexion 5/5 5/5  Knee extension 4/5 4+/5  Ankle dorsiflexion 3-/5 3-/5  Ankle plantarflexion    Ankle inversion    Ankle eversion    (Blank rows = not tested)  Strength testing performed in sitting.   TRANSFERS: Assistive device utilized: None  Sit to stand: SBA Stand to sit: SBA From standard chair can perform without UE support, but reports at home has difficulty getting up from lower surfaces like the couch.   GAIT: Gait pattern: decreased ankle dorsiflexion- Right, decreased ankle dorsiflexion- Left, Right steppage, Left  steppage, narrow BOS, poor foot clearance- Right, and poor foot clearance- Left Distance walked: Clinic distances  Assistive device utilized: None Level of assistance: SBA and CGA Comments: Pt with bilat foot drop and unsteadiness with gait with some staggering requiring CGA. Pt with tendency to almost scissor at times and have a narrow BOS.    FUNCTIONAL TESTS:  5 times sit to stand: 15.63 seconds with no UE support  Timed up and go (TUG): 9 seconds, cog TUG: 14.85 seconds 10 meter walk test: 11.16 seconds = 2.93 ft/sec with no AD   Feet together EO on level ground: 30 seconds, on air ex: 30 seconds  M-CTSIB (with feet hip width distance)  Condition 1: Firm Surface, EO 30 Sec, Normal Sway  Condition 2: Firm Surface, EC 30 Sec, Mild Sway, pt shifting weight posteriorly on heels  Condition 3: Foam Surface, EO 30 Sec,  Mild Sway  Condition 4: Foam Surface, EC 3 Sec, pt losing balance to the R.      TODAY'S TREATMENT: DATE: 08/27/22  Pupil test due to patient c/o of dizziness and "off" after fall  Neuro Re-ed:  ambulate in hallway: -horizontal head turns with cues for reading alphabet from cards 86 ftx 2 sets; extreme dizziness with head turns   VOR 1 seated 30 seconds x 3 trials; very symptom provoking ; dizziness increase to 5-6; takes 3 minutes to decrease to 3/10   Activity Description: square with home base in center Activity Setting:  The [facility] setting was selected for the goal of improving spatial awareness and visual scanning ability, establishing a central point of reference during dynamic movements for enhanced balance and control.   Number of Pods:  5 Cycles/Sets:  2 Duration (Time or Hit Count):  10    Therex: Seated alphabet each LE; cue for straight leg  Df/pf on dynadisc 20x  PATIENT EDUCATION:  Education details: testing, indications of test performance, plan, encourages follow-up with physician s/p fall Person educated: Patient Education method: Explanation, Demonstration, and Verbal cues Education comprehension: verbalized understanding, returned demonstration, and needs further education  HOME EXERCISE PROGRAM: Morning/ In Bed HEP Access Code: [email or web address] URL: [email or web address] Date: 07/22/2022 Prepared by: Rivka Barbara  Exercises - Supine Single Knee to Chest Stretch  - 1 x daily - 7 x weekly - 3 sets - 10 reps - 30 seconds  hold - Supine Lower Trunk Rotation  - 1 x daily - 7 x weekly - 1 sets - 10 reps - 5 sec hold - Supine Bridge with Resistance Band  - 1 x daily - 7 x weekly - 2 sets - 10 reps - Clamshell  - 1 x daily - 7 x weekly - 2 sets - 10 reps   HEP rest of the day:   Access Code: [email or web address] URL: [email or web address] Date: 07/22/2022 Prepared by: Rivka Barbara  Exercises - Seated Long Arc Quad  - 1 x  daily - 5 x weekly - 3 sets - 10 reps - 5 hold - Sit to Stand Without Arm Support  - 1 x daily - 5 x weekly - 2 sets - 8 reps - Romberg Stance with Eyes Closed  - 1 x daily - 5 x weekly - 1 sets - 3 reps - 30 hold - Tandem Stance  - 1 x daily - 5 x weekly - 1 sets - 3 reps - 30 hold - Standing Single Leg Stance with Counter Support  - 1 x daily - 5 x weekly -  1 sets - 3 reps - 30 hold - Seated Gaze Stabilization with Head Nod  - 1 x daily - 7 x weekly - 3 sets - 2 reps - 30 second hold - Seated Ankle Alphabet  - 1 x daily - 7 x weekly - 3 sets - Ankle Eversion with Resistance  - 1 x daily - 7 x weekly - 2 sets - 10 reps - 5 sec  hold - Ankle Dorsiflexion with Resistance  - 1 x daily - 7 x weekly - 2 sets - 10 reps - 5 sec hold  GOALS: Goals reviewed with patient? Yes  SHORT TERM GOALS: Target date: 08/25/2022  Pt will be independent with initial HEP in order to build upon functional gains made in therapy. Baseline: 12/28: Pt reports "kind of on my own I have gone back to the beginning" regarding resuming his initial HEP and that he feels confident/indep performing it Goal status: ONGOING  2.  Patient will increase Dynamic Gait Index to >19/24 as to demonstrate reduced falls risk and improved dynamic gait balance for better safety with community/home ambulation.  Baseline: 06/03/22: 12/24; 07/17/22: deferred Goal status: IN PROGRESS  3.  Pt will improve 5x sit<>stand to less than or equal to 14 sec to demonstrate improved functional strength and transfer efficiency.  Baseline: 15.63 seconds with no UE support; 12/28:  17 seconds hands free Goal status: IN PROGRESS  4.  Pt will ambulate at least 300' over level indoor/outdoor paved surfaces with supervision and LRAD in order to demo improved safety with mobility.  Baseline: No AD over level surfaces, supervision/CGA and unsteadiness.; 12/28: pt ambulates 149 ft with SPC, CGA prior to requiring rest break due to L glute spasm, performed over  even surface Goal status: INITIAL  LONG TERM GOALS: Target date: 09/22/2022   Pt will be independent with final HEP in order to build upon functional gains made in therapy. Baseline: 07/17/22: pt reports performing initial HEP at the moment Goal status: IN PROGRESS  2.  Patient will increase dynamic gait index score to >19/24 as to demonstrate reduced fall risk and improved dynamic gait balance for better safety with community/home ambulation.  Baseline: 06/03/22: 12/24; 07/17/22: deferred 07/22/22: 15/24 Goal status: IN PROGRESS  3.  Pt will improve condition 4 of mCTSIB to at least 12 seconds in order to demo improved vestibular input for balance.  Baseline: 3 seconds, pt losing balance to R.; 12/28: pt able to maintain all 4 conditions without LOB, but does exhibit increased sway in condition 1 and 2 Goal status: MET  4.  Pt will improve gait speed with LRAD or appropriate braces/no AD to at least 3.3 ft/sec in order to demo improved community mobility.   Baseline: 11.16 seconds = 2.93 ft/sec with no AD; 12/28: 2.2 ft/sec with SPC (using due to unsteadiness s/p recent falls) Goal status: IN PROGRESS  5.  Pt will ambulate at least 500' outdoors over paved/grass surfaces with LRAD and supervision in order to demo improved community mobility.  Baseline: 12/28: pt reports he does not think he could walk over uneven surfaces due to pain and unsteadiness Goal status: IN PROGRESS   ASSESSMENT:  CLINICAL IMPRESSION: Patient originally was planned to discharge today but due to fall will continue therapy for another four weeks. Patient agreeable to POC  Pupil testing performed with pupils responding well. Patient has severe dizziness with head turns, recommending patient see vestibular therapist. Patient will benefit from skilled physical therapy to reduce pain,  improve strength, and improve stability to decrease fall risk.      OBJECTIVE IMPAIRMENTS: Abnormal gait, decreased activity  tolerance, decreased balance, decreased knowledge of use of DME, difficulty walking, decreased ROM, decreased strength, and impaired sensation.   ACTIVITY LIMITATIONS: bending, stairs, transfers, and locomotion level  PARTICIPATION LIMITATIONS: community activity and yard work  PERSONAL FACTORS: Past/current experiences, Time since onset of injury/illness/exacerbation, 3+ comorbidities: Arthritis, hx of pancreas cancer, depression, hx of subdural hematoma , hx of hip fracture ~13 yrs ago , and frequent falls (>10 in the past 6 months)  are also affecting patient's functional outcome.   REHAB POTENTIAL: Good  CLINICAL DECISION MAKING: Evolving/moderate complexity  EVALUATION COMPLEXITY: Moderate  PLAN:  PT FREQUENCY: 1x/week  PT DURATION: 8 weeks  PLANNED INTERVENTIONS: Therapeutic exercises, Therapeutic activity, Neuromuscular re-education, Balance training, Gait training, Patient/Family education, Self Care, Stair training, Vestibular training, Orthotic/Fit training, DME instructions, and Re-evaluation  PLAN FOR NEXT SESSION:  Vestibular?   Janna Arch PT  Physical Therapist- [facility]  08/27/22, 3:59 PM

## 2022-08-27 NOTE — Patient Instructions (Signed)
Continue bupropion 300 mg daily  Continue escitalopram 20 mg daily  Next appointment: 5/8 at 8:20

## 2022-08-31 LAB — PARANEOPLASTIC AB
AGNA-1: NEGATIVE
Amphiphysin Antibody: NEGATIVE
Anti-Hu Ab: NEGATIVE
Anti-Ri Ab: NEGATIVE
Anti-Yo Ab: NEGATIVE
Antineruonal nuclear Ab Type 3: NEGATIVE
CASPR2 Antibody,Cell-based IFA: NEGATIVE
CRMP-5 IgG: NEGATIVE
Interpretation: NEGATIVE
LGI1 Antibody, Cell-based IFA: NEGATIVE
Purkinje Cell Cyto Ab Type 2: NEGATIVE
Purkinje Cell Cyto Ab Type Tr: NEGATIVE
VGCC Antibody: <1 pmol/L (ref 0.0–30.0)

## 2022-08-31 LAB — HEAVY METALS PROFILE II, BLOOD
Arsenic: 3 ug/L (ref 0–9)
Cadmium: <0.5 ug/L (ref 0.0–1.2)
Lead, Blood: <1 ug/dL (ref 0.0–3.4)
Mercury: 1 ug/L (ref 0.0–14.9)

## 2022-09-01 ENCOUNTER — Ambulatory Visit: Payer: BC Managed Care – PPO

## 2022-09-01 DIAGNOSIS — M542 Cervicalgia: Secondary | ICD-10-CM

## 2022-09-01 DIAGNOSIS — M5459 Other low back pain: Secondary | ICD-10-CM | POA: Diagnosis not present

## 2022-09-01 DIAGNOSIS — R42 Dizziness and giddiness: Secondary | ICD-10-CM

## 2022-09-01 NOTE — Therapy (Signed)
OUTPATIENT PHYSICAL THERAPY NEURO TREATMENT   Patient Name: Jared [name] MRN: [id number] DOB:04-07-1956, 67 y.o., male Today's Date: 09/01/2022  PCP:  [name], MD  REFERRING PROVIDER: [name], MD  PT End of Session - 09/01/22 1734     Visit Number 16    Number of Visits 20    Date for PT Re-Evaluation 09/22/22    Authorization Type BCBS    Progress Note Due on Visit 20    PT Start Time 1646    PT Stop Time 1730    PT Time Calculation (min) 44 min    Equipment Utilized During Treatment Gait belt    Activity Tolerance Patient tolerated treatment well    Behavior During Therapy Impulsive;WFL for tasks assessed/performed                   Past Medical History:  Diagnosis Date   Arthritis    Cancer ([place])    pancreas   Complication of anesthesia    tends to come out of anesthesia during procedure   Depression    Dysphagia    Elevated lipids    GERD (gastroesophageal reflux disease)    H/O blood clots    superficial blood clot in left leg below knee tx w/ eliquis x 3 mo   History of kidney stones    Kidney disease    Neuroendocrine tumor    Subdural hematoma ([facility])    hx of   Tobacco abuse    Unsteadiness    Past Surgical History:  Procedure Laterality Date   ABDOMINAL SURGERY     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   CHOLECYSTECTOMY     added umbilical mesh   COLONOSCOPY WITH PROPOFOL N/A 03/27/2017   Procedure: COLONOSCOPY WITH PROPOFOL;  Surgeon: Manya Silvas, MD;  Location: [facility] ENDOSCOPY;  Service: Endoscopy;  Laterality: N/A;   ESOPHAGOGASTRODUODENOSCOPY (EGD) WITH PROPOFOL N/A 03/27/2017   Procedure: ESOPHAGOGASTRODUODENOSCOPY (EGD) WITH PROPOFOL;  Surgeon: Manya Silvas, MD;  Location: [facility] ENDOSCOPY;  Service: Endoscopy;  Laterality: N/A;   hip fracture with rod placement Right    MASS EXCISION Left 05/24/2021   Procedure: EXCISION MASS;  Surgeon: Benjamine Sprague, DO;  Location: [facility] ORS;  Service: General;   Laterality: Left;  Prone postition   Patient Active Problem List   Diagnosis Date Noted   Neuropathy 08/22/2022   Gait abnormality 08/22/2022   AAA (abdominal aortic aneurysm) without rupture ([place]) 01/02/2022   RLS (restless legs syndrome) 09/19/2020   Varicose veins of both lower extremities 09/19/2020   TIA (transient ischemic attack) 01/01/2020   History of TIA (transient ischemic attack) 12/27/2019   PVC's (premature ventricular contractions) 12/27/2019   Pill dysphagia 03/17/2017   Hx of colonic polyps 12/05/2016   Irregular heart rate 12/05/2016   Hyperlipidemia 09/07/2015   Tobacco abuse 09/07/2015   Calculus, kidney 04/02/2015   Neuroendocrine tumor 06/22/2008   ONSET DATE: 05/19/2022  REFERRING DIAG: R26.9 (ICD-10-CM) - Gait difficulty R41.3 (ICD-10-CM) - Memory loss  THERAPY DIAG:  Dizziness and giddiness  Cervicalgia  Rationale for Evaluation and Treatment: Rehabilitation  SUBJECTIVE:  SUBJECTIVE STATEMENT: Pt reports continued neck pain since fall. Pt reports vertical head turns still bother him. He has felt off since fall.  Pt accompanied by: self  PERTINENT HISTORY:  67 year old male here for evaluation of gait and balance difficulty and memory loss. Symptoms have been present since 2021. He has developed numbness in his feet, gait and balance difficulty. Has difficulty in the shower when he closes eyes. He feels like he is drunk when he walks. He tends to lose his balance. He is fallen 10 times last few months.  PMH: Arthritis, hx of pancreas cancer, depression, hx of subdural hematoma , hx of hip fracture ~13 yrs ago   PAIN:  Are you having pain? No  PRECAUTIONS: Fall  WEIGHT BEARING RESTRICTIONS: No  FALLS: Has patient fallen in last 6 months? Yes. Number of falls  >10 falls   LIVING ENVIRONMENT: Lives with: lives with their spouse and and wife's mother Lives in: House/apartment Stairs: No Has following equipment at home: Single point cane and Grab bars - has suction cup grab bars   PLOF: Independent  PATIENT GOALS: Wants to improve balance.   OBJECTIVE:   DIAGNOSTIC FINDINGS:  03/27/20 MRI BRAIN 1. Positive for diffuse bilateral subdural hematoma versus dural thickening, 2-3 mm in thickness throughout both the entire posterior fossa and bilateral supratentorial brain. Follow-up noncontrast Head CT might be able to confirm hyperdense blood products, although small SDH this size will frequently be occult by CT. Follow-up Post-contrast brain MRI would be valuable although a degree of dural thickening is typically seen with SDH. Otherwise CSF analysis might also be valuable. 2. No significant intracranial mass effect or other acute intracranial abnormality. 3. MRAs today are reported separately.  Brain and lumbar spine MRI scheduled for 06/09/22  IMPRESSION: This MRI of the lumbar spine without contrast shows the following: 1. At L4-L5, there is severe loss of disc height and other degenerative change causing moderately severe foraminal narrowing and moderate lateral recess stenosis.  There is potential for L4 nerve root compression.  There is no spinal stenosis. 2. At L5-S1, there are degenerative changes causing moderately severe foraminal narrowing and moderate lateral recess stenosis.  There is potential for L5 nerve root compression to either side.  There is no spinal stenosis. 3. Milder degenerative changes at the other lumbar levels do not lead to spinal stenosis or nerve root compression.   COGNITION: Overall cognitive status: Within functional limits for tasks assessed, however pt reporting some memory issues.    SENSATION: Light touch: Reports numbness/tingling in feet and in thighs, with light touch pt more impaired distally in RLE   Proprioception: WFL at bilat ankles   COORDINATION: Heel to shin: WFL bilat   LOWER EXTREMITY MMT:    MMT Right Eval Left Eval  Hip flexion 4/5 4-/5  Hip extension    Hip abduction 5/5 5/5  Hip adduction 5/5 5/5  Hip internal rotation    Hip external rotation    Knee flexion 5/5 5/5  Knee extension 4/5 4+/5  Ankle dorsiflexion 3-/5 3-/5  Ankle plantarflexion    Ankle inversion    Ankle eversion    (Blank rows = not tested)  Strength testing performed in sitting.   TRANSFERS: Assistive device utilized: None  Sit to stand: SBA Stand to sit: SBA From standard chair can perform without UE support, but reports at home has difficulty getting up from lower surfaces like the couch.   GAIT: Gait pattern: decreased ankle dorsiflexion- Right, decreased ankle  dorsiflexion- Left, Right steppage, Left steppage, narrow BOS, poor foot clearance- Right, and poor foot clearance- Left Distance walked: Clinic distances  Assistive device utilized: None Level of assistance: SBA and CGA Comments: Pt with bilat foot drop and unsteadiness with gait with some staggering requiring CGA. Pt with tendency to almost scissor at times and have a narrow BOS.    FUNCTIONAL TESTS:  5 times sit to stand: 15.63 seconds with no UE support  Timed up and go (TUG): 9 seconds, cog TUG: 14.85 seconds 10 meter walk test: 11.16 seconds = 2.93 ft/sec with no AD   Feet together EO on level ground: 30 seconds, on air ex: 30 seconds  M-CTSIB (with feet hip width distance)  Condition 1: Firm Surface, EO 30 Sec, Normal Sway  Condition 2: Firm Surface, EC 30 Sec, Mild Sway, pt shifting weight posteriorly on heels  Condition 3: Foam Surface, EO 30 Sec, Mild Sway  Condition 4: Foam Surface, EC 3 Sec, pt losing balance to the R.      TODAY'S TREATMENT: DATE: 09/01/22   Dix-Hallpike testing: negative bilaterally Roll testing: negative bilaterally Comments: pt noted some R neck pain during testing that improved  out of position  Heat donned to bilateral shoulders (pt reports intervention feels good) x 8 min  Seated VORx1 2x30 sec of vertical and horizontal head turns - cued pt to decrease ROM with intervention and to slow speed to remain within 2/10 symptom range.   Cervical rotation stretch 2x30 sec each side - cuing for decreasing ROM to improve comfort Cervical lateral flexion stretch 2x30 sec each side. Noted significant impairment with R lateral flexion Cervical flexion stretch 1x30 sec  Heat removed as pt reported feeling slight nausea, improved upon removal of heat  Updated HEP to address cervical impairments and reviewed in session  Following stretching pt reports feeling R side has "loosened up some"   Pt reports no dizziness at end of session  PATIENT EDUCATION:  Education details: updated HEP, instructed in regressing VORx1 to seated with slower speed of head turns until not as symptom provoking Person educated: Patient Education method: Explanation, Demonstration, Verbal cues, and Handouts Education comprehension: verbalized understanding, returned demonstration, and needs further education  HOME EXERCISE PROGRAM: 09/01/22: Access Code: [email or web address] URL: [email or web address] Date: 09/01/2022 Prepared by: Ricard Dillon  Exercises - Seated Cervical Sidebending Stretch  - 1 x daily - 7 x weekly - 2 sets - 2 reps - 30 seconds hold - Seated Assisted Cervical Rotation with Towel  - 1 x daily - 7 x weekly - 2 sets - 2 reps - 30 seconds hold   Morning/ In Bed HEP Access Code: [email or web address] URL: [email or web address] Date: 07/22/2022 Prepared by: Rivka Barbara  Exercises - Supine Single Knee to Chest Stretch  - 1 x daily - 7 x weekly - 3 sets - 10 reps - 30 seconds  hold - Supine Lower Trunk Rotation  - 1 x daily - 7 x weekly - 1 sets - 10 reps - 5 sec hold - Supine Bridge with Resistance Band  - 1 x daily - 7 x weekly - 2 sets - 10 reps - Clamshell  - 1  x daily - 7 x weekly - 2 sets - 10 reps   HEP rest of the day:   Access Code: [email or web address] URL: [email or web address] Date: 07/22/2022 Prepared by: Rivka Barbara  Exercises - Seated Long Arc Quad  - 1 x daily - 5 x weekly - 3 sets - 10 reps -  5 hold - Sit to Stand Without Arm Support  - 1 x daily - 5 x weekly - 2 sets - 8 reps - Romberg Stance with Eyes Closed  - 1 x daily - 5 x weekly - 1 sets - 3 reps - 30 hold - Tandem Stance  - 1 x daily - 5 x weekly - 1 sets - 3 reps - 30 hold - Standing Single Leg Stance with Counter Support  - 1 x daily - 5 x weekly - 1 sets - 3 reps - 30 hold - Seated Gaze Stabilization with Head Nod  - 1 x daily - 7 x weekly - 3 sets - 2 reps - 30 second hold - Seated Ankle Alphabet  - 1 x daily - 7 x weekly - 3 sets - Ankle Eversion with Resistance  - 1 x daily - 7 x weekly - 2 sets - 10 reps - 5 sec  hold - Ankle Dorsiflexion with Resistance  - 1 x daily - 7 x weekly - 2 sets - 10 reps - 5 sec hold  GOALS: Goals reviewed with patient? Yes  SHORT TERM GOALS: Target date: 08/25/2022  Pt will be independent with initial HEP in order to build upon functional gains made in therapy. Baseline: 12/28: Pt reports "kind of on my own I have gone back to the beginning" regarding resuming his initial HEP and that he feels confident/indep performing it Goal status: ONGOING  2.  Patient will increase Dynamic Gait Index to >19/24 as to demonstrate reduced falls risk and improved dynamic gait balance for better safety with community/home ambulation.  Baseline: 06/03/22: 12/24; 07/17/22: deferred Goal status: IN PROGRESS  3.  Pt will improve 5x sit<>stand to less than or equal to 14 sec to demonstrate improved functional strength and transfer efficiency.  Baseline: 15.63 seconds with no UE support; 12/28:  17 seconds hands free Goal status: IN PROGRESS  4.  Pt will ambulate at least 300' over level indoor/outdoor paved surfaces with supervision and LRAD in  order to demo improved safety with mobility.  Baseline: No AD over level surfaces, supervision/CGA and unsteadiness.; 12/28: pt ambulates 149 ft with SPC, CGA prior to requiring rest break due to L glute spasm, performed over even surface Goal status: INITIAL  LONG TERM GOALS: Target date: 09/22/2022   Pt will be independent with final HEP in order to build upon functional gains made in therapy. Baseline: 07/17/22: pt reports performing initial HEP at the moment Goal status: IN PROGRESS  2.  Patient will increase dynamic gait index score to >19/24 as to demonstrate reduced fall risk and improved dynamic gait balance for better safety with community/home ambulation.  Baseline: 06/03/22: 12/24; 07/17/22: deferred 07/22/22: 15/24 Goal status: IN PROGRESS  3.  Pt will improve condition 4 of mCTSIB to at least 12 seconds in order to demo improved vestibular input for balance.  Baseline: 3 seconds, pt losing balance to R.; 12/28: pt able to maintain all 4 conditions without LOB, but does exhibit increased sway in condition 1 and 2 Goal status: MET  4.  Pt will improve gait speed with LRAD or appropriate braces/no AD to at least 3.3 ft/sec in order to demo improved community mobility.   Baseline: 11.16 seconds = 2.93 ft/sec with no AD; 12/28: 2.2 ft/sec with SPC (using due to unsteadiness s/p recent falls) Goal status: IN PROGRESS  5.  Pt will ambulate at least 500' outdoors over paved/grass surfaces with LRAD and supervision in order  to demo improved community mobility.  Baseline: 12/28: pt reports he does not think he could walk over uneven surfaces due to pain and unsteadiness Goal status: IN PROGRESS   ASSESSMENT:  CLINICAL IMPRESSION: Vestibular reassessment completed due to pt recent fall with head impact. Pt positional testing was all negative. Pt noted to have painful and impaired cervical ROM he reports has been present since fall, although somewhat improved. Cervical impairment and  pain possibly contributing to pt's recent increase in dizzy feelings, and the pt would likely benefit from stretching and manual therapy to improve pain and cervical mobility. PT provided addition to HEP to perform 2 cervical stretches within pain-free ROM. Pt demonstrated correct technique within session and verbalized understanding of interventions and precautions. Patient will benefit from skilled physical therapy to reduce pain, improve strength, and improve stability to decrease fall risk.      OBJECTIVE IMPAIRMENTS: Abnormal gait, decreased activity tolerance, decreased balance, decreased knowledge of use of DME, difficulty walking, decreased ROM, decreased strength, and impaired sensation.   ACTIVITY LIMITATIONS: bending, stairs, transfers, and locomotion level  PARTICIPATION LIMITATIONS: community activity and yard work  PERSONAL FACTORS: Past/current experiences, Time since onset of injury/illness/exacerbation, 3+ comorbidities: Arthritis, hx of pancreas cancer, depression, hx of subdural hematoma , hx of hip fracture ~13 yrs ago , and frequent falls (>10 in the past 6 months)  are also affecting patient's functional outcome.   REHAB POTENTIAL: Good  CLINICAL DECISION MAKING: Evolving/moderate complexity  EVALUATION COMPLEXITY: Moderate  PLAN:  PT FREQUENCY: 1x/week  PT DURATION: 8 weeks  PLANNED INTERVENTIONS: Therapeutic exercises, Therapeutic activity, Neuromuscular re-education, Balance training, Gait training, Patient/Family education, Self Care, Stair training, Vestibular training, Orthotic/Fit training, DME instructions, and Re-evaluation  PLAN FOR NEXT SESSION:  Manual therapy for shoulders, cervical paraspinals, regressed VORx1 to seated with slower speed, balance  Ricard Dillon PT, DPT   Physical Therapist- [facility]  09/01/22, 5:42 PM

## 2022-09-03 ENCOUNTER — Ambulatory Visit: Payer: BC Managed Care – PPO

## 2022-09-08 ENCOUNTER — Ambulatory Visit: Payer: BC Managed Care – PPO

## 2022-09-09 ENCOUNTER — Ambulatory Visit (INDEPENDENT_AMBULATORY_CARE_PROVIDER_SITE_OTHER): Payer: BC Managed Care – PPO | Admitting: Licensed Clinical Social Worker

## 2022-09-09 DIAGNOSIS — F3341 Major depressive disorder, recurrent, in partial remission: Secondary | ICD-10-CM

## 2022-09-09 NOTE — Progress Notes (Signed)
Virtual Visit via Video Note  I connected with Jared [name] on 09/09/22 at  9:00 AM EST by a video enabled telemedicine application and verified that I am speaking with the correct person using two identifiers.  Location: Patient: home Provider: remote office [place], [place])   I discussed the limitations of evaluation and management by telemedicine and the availability of in person appointments. The patient expressed understanding and agreed to proceed.   I discussed the assessment and treatment plan with the patient. The patient was provided an opportunity to ask questions and all were answered. The patient agreed with the plan and demonstrated an understanding of the instructions.   The patient was advised to call back or seek an in-person evaluation if the symptoms worsen or if the condition fails to improve as anticipated.  I provided 15 minutes of non-face-to-face time during this encounter.  [place], LCSW   THERAPIST PROGRESS NOTE  Session Time: [phone]  Participation Level: Active  Behavioral Response: Neat and Well GroomedAlertDepressed  Type of Therapy: Individual Therapy  Treatment Goals addressed:Develop healthy thinking patterns and beliefs about self, others, and the world that lead to the alleviation and help prevent the relapse of depression per self report 3 out of 5 sessions documented.   Decrease depressive symptoms and improve levels of effective functioning-pt reports a decrease in overall depression symptoms 3 out of 5 sessions documented.   ProgressTowards Goals: Progressing--still triggered by health-related issues still ongoing at time of session  Interventions: CBT  Summary: Jared [name] is a 67 y.o. male who presents with improving symptoms related to depression.patient reports that he is compliant with his medication, and is compliant with psychiatric medication management visits.  Pt reports good quality and quantity of sleep.   Patient states he does have a sleep study scheduled.  Patient reports appetite is within normal limits.   Pt reports that he is compliant with his medication but is concerned about his recent increase in falling recently.  Patient states that he is currently in the process of getting additional assessments.  Patient states that he is aware that he has neuropathy, but is ruling out any additional concerns.  Patient states that he is being intentional about positive behavioral activation, including walks outside on a regular basis.  Patient states he is also engaged in physical therapy on a regular basis.  Patient reports that he feels that he is managing external stressors well--patient states that he is using his coping skills to regulate emotion, manage depression symptoms, and manage anxiety and stress.  Patient states that he is continuing to work full-time, but is making plans to retire in the next couple of months so that he can start enjoying his life outside of work.  Allow patient to explore personal goals that he set for himself, and things that he wants to do in the next few months after retirement.  Continued recommendations are as follows: self care behaviors, positive social engagements, focusing on overall work/home/life balance, and focusing on positive physical and emotional wellness.   Suicidal/Homicidal: No  Therapist Response: Pt is continuing to apply interventions learned in session into daily life situations. Pt is currently on track to meet goals utilizing interventions mentioned above. Personal growth and progress noted.  Patient continues to make good progress with anxiety management, stress management, and depression management.  Patient is being intentional about developing his capacity for improving self-care, life management, and focus on improving both emotional and physical health.  Plan:  Return again in 3 mo  Diagnosis:  Encounter Diagnosis  Name Primary?   MDD (major  depressive disorder), recurrent, in partial remission ([place]) Yes    Collaboration of Care: Other pt encouraged to continue care with psychiatrist of record, Dr. Ursula Alert  Patient/Guardian was advised Release of Information must be obtained prior to any record release in order to collaborate their care with an outside provider. Patient/Guardian was advised if they have not already done so to contact the registration department to sign all necessary forms in order for [place] to release information regarding their care.   Consent: Patient/Guardian gives verbal consent for treatment and assignment of benefits for services provided during this visit. Patient/Guardian expressed understanding and agreed to proceed.   [place], LCSW 09/09/2022

## 2022-09-10 ENCOUNTER — Ambulatory Visit: Payer: BC Managed Care – PPO

## 2022-09-10 DIAGNOSIS — R2681 Unsteadiness on feet: Secondary | ICD-10-CM

## 2022-09-10 DIAGNOSIS — M5459 Other low back pain: Secondary | ICD-10-CM | POA: Diagnosis not present

## 2022-09-10 DIAGNOSIS — R262 Difficulty in walking, not elsewhere classified: Secondary | ICD-10-CM

## 2022-09-10 DIAGNOSIS — M6281 Muscle weakness (generalized): Secondary | ICD-10-CM

## 2022-09-10 DIAGNOSIS — M542 Cervicalgia: Secondary | ICD-10-CM

## 2022-09-10 NOTE — Therapy (Signed)
OUTPATIENT PHYSICAL THERAPY NEURO TREATMENT   Patient Name: Jared [name] MRN: [id number] DOB:September 16, 1955, 67 y.o., male Today's Date: 09/10/2022  PCP:  [name], MD  REFERRING PROVIDER: [name], MD  PT End of Session - 09/10/22 1646     Visit Number 17    Number of Visits 20    Date for PT Re-Evaluation 09/22/22    Authorization Type [employer]    Progress Note Due on Visit 20    PT Start Time 1645    PT Stop Time 1729    PT Time Calculation (min) 44 min    Equipment Utilized During Treatment Gait belt    Activity Tolerance Patient tolerated treatment well    Behavior During Therapy Impulsive;WFL for tasks assessed/performed                    Past Medical History:  Diagnosis Date   Arthritis    Cancer ([place])    pancreas   Complication of anesthesia    tends to come out of anesthesia during procedure   Depression    Dysphagia    Elevated lipids    GERD (gastroesophageal reflux disease)    H/O blood clots    superficial blood clot in left leg below knee tx w/ eliquis x 3 mo   History of kidney stones    Kidney disease    Neuroendocrine tumor    Subdural hematoma ([facility])    hx of   Tobacco abuse    Unsteadiness    Past Surgical History:  Procedure Laterality Date   ABDOMINAL SURGERY     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   CHOLECYSTECTOMY     added umbilical mesh   COLONOSCOPY WITH PROPOFOL N/A 03/27/2017   Procedure: COLONOSCOPY WITH PROPOFOL;  Surgeon: Manya Silvas, MD;  Location: [facility] ENDOSCOPY;  Service: Endoscopy;  Laterality: N/A;   ESOPHAGOGASTRODUODENOSCOPY (EGD) WITH PROPOFOL N/A 03/27/2017   Procedure: ESOPHAGOGASTRODUODENOSCOPY (EGD) WITH PROPOFOL;  Surgeon: Manya Silvas, MD;  Location: [facility] ENDOSCOPY;  Service: Endoscopy;  Laterality: N/A;   hip fracture with rod placement Right    MASS EXCISION Left 05/24/2021   Procedure: EXCISION MASS;  Surgeon: Benjamine Sprague, DO;  Location: [facility] ORS;  Service: General;   Laterality: Left;  Prone postition   Patient Active Problem List   Diagnosis Date Noted   Neuropathy 08/22/2022   Gait abnormality 08/22/2022   AAA (abdominal aortic aneurysm) without rupture ([place]) 01/02/2022   RLS (restless legs syndrome) 09/19/2020   Varicose veins of both lower extremities 09/19/2020   TIA (transient ischemic attack) 01/01/2020   History of TIA (transient ischemic attack) 12/27/2019   PVC's (premature ventricular contractions) 12/27/2019   Pill dysphagia 03/17/2017   Hx of colonic polyps 12/05/2016   Irregular heart rate 12/05/2016   Hyperlipidemia 09/07/2015   Tobacco abuse 09/07/2015   Calculus, kidney 04/02/2015   Neuroendocrine tumor 06/22/2008   ONSET DATE: 05/19/2022  REFERRING DIAG: R26.9 (ICD-10-CM) - Gait difficulty R41.3 (ICD-10-CM) - Memory loss  THERAPY DIAG:  Cervicalgia  Other low back pain  Muscle weakness (generalized)  Unsteadiness on feet  Difficulty in walking, not elsewhere classified  Rationale for Evaluation and Treatment: Rehabilitation  SUBJECTIVE:  SUBJECTIVE STATEMENT: Patient reports no falls or LOB since last session. Has been doing his HEP and squats.   Pt accompanied by: self  PERTINENT HISTORY:  67 year old male here for evaluation of gait and balance difficulty and memory loss. Symptoms have been present since 2021. He has developed numbness in his feet, gait and balance difficulty. Has difficulty in the shower when he closes eyes. He feels like he is drunk when he walks. He tends to lose his balance. He is fallen 10 times last few months.  PMH: Arthritis, hx of pancreas cancer, depression, hx of subdural hematoma , hx of hip fracture ~13 yrs ago   PAIN:  Are you having pain? No  PRECAUTIONS: Fall  WEIGHT BEARING RESTRICTIONS:  No  FALLS: Has patient fallen in last 6 months? Yes. Number of falls >10 falls   LIVING ENVIRONMENT: Lives with: lives with their spouse and and wife's mother Lives in: House/apartment Stairs: No Has following equipment at home: Single point cane and Grab bars - has suction cup grab bars   PLOF: Independent  PATIENT GOALS: Wants to improve balance.   OBJECTIVE:   DIAGNOSTIC FINDINGS:  03/27/20 MRI BRAIN 1. Positive for diffuse bilateral subdural hematoma versus dural thickening, 2-3 mm in thickness throughout both the entire posterior fossa and bilateral supratentorial brain. Follow-up noncontrast Head CT might be able to confirm hyperdense blood products, although small SDH this size will frequently be occult by CT. Follow-up Post-contrast brain MRI would be valuable although a degree of dural thickening is typically seen with SDH. Otherwise CSF analysis might also be valuable. 2. No significant intracranial mass effect or other acute intracranial abnormality. 3. MRAs today are reported separately.  Brain and lumbar spine MRI scheduled for 06/09/22  IMPRESSION: This MRI of the lumbar spine without contrast shows the following: 1. At L4-L5, there is severe loss of disc height and other degenerative change causing moderately severe foraminal narrowing and moderate lateral recess stenosis.  There is potential for L4 nerve root compression.  There is no spinal stenosis. 2. At L5-S1, there are degenerative changes causing moderately severe foraminal narrowing and moderate lateral recess stenosis.  There is potential for L5 nerve root compression to either side.  There is no spinal stenosis. 3. Milder degenerative changes at the other lumbar levels do not lead to spinal stenosis or nerve root compression.   COGNITION: Overall cognitive status: Within functional limits for tasks assessed, however pt reporting some memory issues.    SENSATION: Light touch: Reports numbness/tingling  in feet and in thighs, with light touch pt more impaired distally in RLE  Proprioception: WFL at bilat ankles   COORDINATION: Heel to shin: WFL bilat   LOWER EXTREMITY MMT:    MMT Right Eval Left Eval  Hip flexion 4/5 4-/5  Hip extension    Hip abduction 5/5 5/5  Hip adduction 5/5 5/5  Hip internal rotation    Hip external rotation    Knee flexion 5/5 5/5  Knee extension 4/5 4+/5  Ankle dorsiflexion 3-/5 3-/5  Ankle plantarflexion    Ankle inversion    Ankle eversion    (Blank rows = not tested)  Strength testing performed in sitting.   TRANSFERS: Assistive device utilized: None  Sit to stand: SBA Stand to sit: SBA From standard chair can perform without UE support, but reports at home has difficulty getting up from lower surfaces like the couch.   GAIT: Gait pattern: decreased ankle dorsiflexion- Right, decreased ankle dorsiflexion- Left, Right steppage,  Left steppage, narrow BOS, poor foot clearance- Right, and poor foot clearance- Left Distance walked: Clinic distances  Assistive device utilized: None Level of assistance: SBA and CGA Comments: Pt with bilat foot drop and unsteadiness with gait with some staggering requiring CGA. Pt with tendency to almost scissor at times and have a narrow BOS.    FUNCTIONAL TESTS:  5 times sit to stand: 15.63 seconds with no UE support  Timed up and go (TUG): 9 seconds, cog TUG: 14.85 seconds 10 meter walk test: 11.16 seconds = 2.93 ft/sec with no AD   Feet together EO on level ground: 30 seconds, on air ex: 30 seconds  M-CTSIB (with feet hip width distance)  Condition 1: Firm Surface, EO 30 Sec, Normal Sway  Condition 2: Firm Surface, EC 30 Sec, Mild Sway, pt shifting weight posteriorly on heels  Condition 3: Foam Surface, EO 30 Sec, Mild Sway  Condition 4: Foam Surface, EC 3 Sec, pt losing balance to the R.      TODAY'S TREATMENT: DATE: 09/10/22  Neuro Re-ed:   Step over hurdle and back 10x each LE   Half foam  roller: -static stand 30 seconds -heel toe rock 15x   Airex balance beam:  -lateral stepping 6x length of // bars ; no UE support  -tandem walk 6x length of // bar with UE support -chest press with ball 10x   Bosu ball round side up: -UE support forward lunges 10x each side -lateral lunge 10x each side   Access Code: [email or web address] URL: [email or web address] Date: 09/10/2022 Prepared by: Janna Arch  Exercises - Seated Gastroc Stretch with Strap  - 1 x daily - 7 x weekly - 2 sets - 2 reps - 30 hold - Gastroc Stretch with Foot at [place]  - 1 x daily - 7 x weekly - 2 sets - 10 reps - 5 hold   PATIENT EDUCATION:  Education details: updated HEP, instructed in regressing VORx1 to seated with slower speed of head turns until not as symptom provoking Person educated: Patient Education method: Explanation, Demonstration, Verbal cues, and Handouts Education comprehension: verbalized understanding, returned demonstration, and needs further education  HOME EXERCISE PROGRAM: 09/01/22: Access Code: [email or web address] URL: [email or web address] Date: 09/01/2022 Prepared by: Ricard Dillon  Exercises - Seated Cervical Sidebending Stretch  - 1 x daily - 7 x weekly - 2 sets - 2 reps - 30 seconds hold - Seated Assisted Cervical Rotation with Towel  - 1 x daily - 7 x weekly - 2 sets - 2 reps - 30 seconds hold   Morning/ In Bed HEP Access Code: [email or web address] URL: [email or web address] Date: 07/22/2022 Prepared by: Rivka Barbara  Exercises - Supine Single Knee to Chest Stretch  - 1 x daily - 7 x weekly - 3 sets - 10 reps - 30 seconds  hold - Supine Lower Trunk Rotation  - 1 x daily - 7 x weekly - 1 sets - 10 reps - 5 sec hold - Supine Bridge with Resistance Band  - 1 x daily - 7 x weekly - 2 sets - 10 reps - Clamshell  - 1 x daily - 7 x weekly - 2 sets - 10 reps   HEP rest of the day:   Access Code: [email or web address] URL: [email or web address] Date:  07/22/2022 Prepared by: Rivka Barbara  Exercises - Seated Long Arc Quad  - 1 x daily - 5 x weekly - 3 sets - 10 reps - 5 hold - Sit to Stand Without Arm Support  - 1  x daily - 5 x weekly - 2 sets - 8 reps - Romberg Stance with Eyes Closed  - 1 x daily - 5 x weekly - 1 sets - 3 reps - 30 hold - Tandem Stance  - 1 x daily - 5 x weekly - 1 sets - 3 reps - 30 hold - Standing Single Leg Stance with Counter Support  - 1 x daily - 5 x weekly - 1 sets - 3 reps - 30 hold - Seated Gaze Stabilization with Head Nod  - 1 x daily - 7 x weekly - 3 sets - 2 reps - 30 second hold - Seated Ankle Alphabet  - 1 x daily - 7 x weekly - 3 sets - Ankle Eversion with Resistance  - 1 x daily - 7 x weekly - 2 sets - 10 reps - 5 sec  hold - Ankle Dorsiflexion with Resistance  - 1 x daily - 7 x weekly - 2 sets - 10 reps - 5 sec hold  Access Code: [facility] URL: [email or web address] Date: 09/10/2022 Prepared by: Janna Arch  Exercises - Seated Gastroc Stretch with Strap  - 1 x daily - 7 x weekly - 2 sets - 2 reps - 30 hold - Gastroc Stretch with Foot at [place]  - 1 x daily - 7 x weekly - 2 sets - 10 reps - 5 hold GOALS: Goals reviewed with patient? Yes  SHORT TERM GOALS: Target date: 08/25/2022  Pt will be independent with initial HEP in order to build upon functional gains made in therapy. Baseline: 12/28: Pt reports "kind of on my own I have gone back to the beginning" regarding resuming his initial HEP and that he feels confident/indep performing it Goal status: ONGOING  2.  Patient will increase Dynamic Gait Index to >19/24 as to demonstrate reduced falls risk and improved dynamic gait balance for better safety with community/home ambulation.  Baseline: 06/03/22: 12/24; 07/17/22: deferred Goal status: IN PROGRESS  3.  Pt will improve 5x sit<>stand to less than or equal to 14 sec to demonstrate improved functional strength and transfer efficiency.  Baseline: 15.63 seconds with no UE support;  12/28:  17 seconds hands free Goal status: IN PROGRESS  4.  Pt will ambulate at least 300' over level indoor/outdoor paved surfaces with supervision and LRAD in order to demo improved safety with mobility.  Baseline: No AD over level surfaces, supervision/CGA and unsteadiness.; 12/28: pt ambulates 149 ft with SPC, CGA prior to requiring rest break due to L glute spasm, performed over even surface Goal status: INITIAL  LONG TERM GOALS: Target date: 09/22/2022   Pt will be independent with final HEP in order to build upon functional gains made in therapy. Baseline: 07/17/22: pt reports performing initial HEP at the moment Goal status: IN PROGRESS  2.  Patient will increase dynamic gait index score to >19/24 as to demonstrate reduced fall risk and improved dynamic gait balance for better safety with community/home ambulation.  Baseline: 06/03/22: 12/24; 07/17/22: deferred 07/22/22: 15/24 Goal status: IN PROGRESS  3.  Pt will improve condition 4 of mCTSIB to at least 12 seconds in order to demo improved vestibular input for balance.  Baseline: 3 seconds, pt losing balance to R.; 12/28: pt able to maintain all 4 conditions without LOB, but does exhibit increased sway in condition 1 and 2 Goal status: MET  4.  Pt will improve gait speed with LRAD or appropriate braces/no AD to at least 3.3 ft/sec in order to  demo improved community mobility.   Baseline: 11.16 seconds = 2.93 ft/sec with no AD; 12/28: 2.2 ft/sec with SPC (using due to unsteadiness s/p recent falls) Goal status: IN PROGRESS  5.  Pt will ambulate at least 500' outdoors over paved/grass surfaces with LRAD and supervision in order to demo improved community mobility.  Baseline: 12/28: pt reports he does not think he could walk over uneven surfaces due to pain and unsteadiness Goal status: IN PROGRESS   ASSESSMENT:  CLINICAL IMPRESSION: Patient educated on gastroc stretches demonstrating understanding. Patient highly motivated for  progression of care and stability. Unstable surfaces continue to challenge ankle righting reactions.  Patient will benefit from skilled physical therapy to reduce pain, improve strength, and improve stability to decrease fall risk.      OBJECTIVE IMPAIRMENTS: Abnormal gait, decreased activity tolerance, decreased balance, decreased knowledge of use of DME, difficulty walking, decreased ROM, decreased strength, and impaired sensation.   ACTIVITY LIMITATIONS: bending, stairs, transfers, and locomotion level  PARTICIPATION LIMITATIONS: community activity and yard work  PERSONAL FACTORS: Past/current experiences, Time since onset of injury/illness/exacerbation, 3+ comorbidities: Arthritis, hx of pancreas cancer, depression, hx of subdural hematoma , hx of hip fracture ~13 yrs ago , and frequent falls (>10 in the past 6 months)  are also affecting patient's functional outcome.   REHAB POTENTIAL: Good  CLINICAL DECISION MAKING: Evolving/moderate complexity  EVALUATION COMPLEXITY: Moderate  PLAN:  PT FREQUENCY: 1x/week  PT DURATION: 8 weeks  PLANNED INTERVENTIONS: Therapeutic exercises, Therapeutic activity, Neuromuscular re-education, Balance training, Gait training, Patient/Family education, Self Care, Stair training, Vestibular training, Orthotic/Fit training, DME instructions, and Re-evaluation  PLAN FOR NEXT SESSION:  Manual therapy for shoulders, cervical paraspinals, regressed VORx1 to seated with slower speed, balance  [facility]  09/10/22, 5:35 PM

## 2022-09-15 ENCOUNTER — Other Ambulatory Visit: Payer: Self-pay | Admitting: Psychiatry

## 2022-09-15 ENCOUNTER — Ambulatory Visit: Payer: BC Managed Care – PPO

## 2022-09-17 ENCOUNTER — Ambulatory Visit: Payer: BC Managed Care – PPO

## 2022-09-18 NOTE — Therapy (Signed)
OUTPATIENT PHYSICAL THERAPY NEURO TREATMENT/Discharge    Patient Name: Jared [name] MRN: [id number] DOB:May 08, 1956, 67 y.o., male Today's Date: 09/22/2022  PCP:  [name], MD  REFERRING PROVIDER: [name], MD  PT End of Session - 09/22/22 1558     Visit Number 18    Number of Visits 20    Date for PT Re-Evaluation 09/22/22    Authorization Type BCBS    Progress Note Due on Visit 20    PT Start Time 1600    PT Stop Time 1634    PT Time Calculation (min) 34 min    Equipment Utilized During Treatment Gait belt    Activity Tolerance Patient tolerated treatment well    Behavior During Therapy Impulsive;WFL for tasks assessed/performed                     Past Medical History:  Diagnosis Date   Arthritis    Cancer ([facility])    pancreas   Complication of anesthesia    tends to come out of anesthesia during procedure   Depression    Dysphagia    Elevated lipids    GERD (gastroesophageal reflux disease)    H/O blood clots    superficial blood clot in left leg below knee tx w/ eliquis x 3 mo   History of kidney stones    Kidney disease    Neuroendocrine tumor    Subdural hematoma ([facility])    hx of   Tobacco abuse    Unsteadiness    Past Surgical History:  Procedure Laterality Date   ABDOMINAL SURGERY     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   CHOLECYSTECTOMY     added umbilical mesh   COLONOSCOPY WITH PROPOFOL N/A 03/27/2017   Procedure: COLONOSCOPY WITH PROPOFOL;  Surgeon: Manya Silvas, MD;  Location: [facility] ENDOSCOPY;  Service: Endoscopy;  Laterality: N/A;   ESOPHAGOGASTRODUODENOSCOPY (EGD) WITH PROPOFOL N/A 03/27/2017   Procedure: ESOPHAGOGASTRODUODENOSCOPY (EGD) WITH PROPOFOL;  Surgeon: Manya Silvas, MD;  Location: [facility] ENDOSCOPY;  Service: Endoscopy;  Laterality: N/A;   hip fracture with rod placement Right    MASS EXCISION Left 05/24/2021   Procedure: EXCISION MASS;  Surgeon: Benjamine Sprague, DO;  Location: [facility] ORS;  Service:  General;  Laterality: Left;  Prone postition   Patient Active Problem List   Diagnosis Date Noted   Neuropathy 08/22/2022   Gait abnormality 08/22/2022   AAA (abdominal aortic aneurysm) without rupture ([place]) 01/02/2022   RLS (restless legs syndrome) 09/19/2020   Varicose veins of both lower extremities 09/19/2020   TIA (transient ischemic attack) 01/01/2020   History of TIA (transient ischemic attack) 12/27/2019   PVC's (premature ventricular contractions) 12/27/2019   Pill dysphagia 03/17/2017   Hx of colonic polyps 12/05/2016   Irregular heart rate 12/05/2016   Hyperlipidemia 09/07/2015   Tobacco abuse 09/07/2015   Calculus, kidney 04/02/2015   Neuroendocrine tumor 06/22/2008   ONSET DATE: 05/19/2022  REFERRING DIAG: R26.9 (ICD-10-CM) - Gait difficulty R41.3 (ICD-10-CM) - Memory loss  THERAPY DIAG:  Cervicalgia  Other low back pain  Muscle weakness (generalized)  Difficulty in walking, not elsewhere classified  Rationale for Evaluation and Treatment: Rehabilitation  SUBJECTIVE:  SUBJECTIVE STATEMENT: Patient reports he still can't jog. Is doing better, reports he can do more than he could before. Mentally and physically feel really good.   Pt accompanied by: self  PERTINENT HISTORY:  68 year old male here for evaluation of gait and balance difficulty and memory loss. Symptoms have been present since 2021. He has developed numbness in his feet, gait and balance difficulty. Has difficulty in the shower when he closes eyes. He feels like he is drunk when he walks. He tends to lose his balance. He is fallen 10 times last few months.  PMH: Arthritis, hx of pancreas cancer, depression, hx of subdural hematoma , hx of hip fracture ~13 yrs ago   PAIN:  Are you having pain? No  PRECAUTIONS:  Fall  WEIGHT BEARING RESTRICTIONS: No  FALLS: Has patient fallen in last 6 months? Yes. Number of falls >10 falls   LIVING ENVIRONMENT: Lives with: lives with their spouse and and wife's mother Lives in: House/apartment Stairs: No Has following equipment at home: Single point cane and Grab bars - has suction cup grab bars   PLOF: Independent  PATIENT GOALS: Wants to improve balance.   OBJECTIVE:   DIAGNOSTIC FINDINGS:  03/27/20 MRI BRAIN 1. Positive for diffuse bilateral subdural hematoma versus dural thickening, 2-3 mm in thickness throughout both the entire posterior fossa and bilateral supratentorial brain. Follow-up noncontrast Head CT might be able to confirm hyperdense blood products, although small SDH this size will frequently be occult by CT. Follow-up Post-contrast brain MRI would be valuable although a degree of dural thickening is typically seen with SDH. Otherwise CSF analysis might also be valuable. 2. No significant intracranial mass effect or other acute intracranial abnormality. 3. MRAs today are reported separately.  Brain and lumbar spine MRI scheduled for 06/09/22  IMPRESSION: This MRI of the lumbar spine without contrast shows the following: 1. At L4-L5, there is severe loss of disc height and other degenerative change causing moderately severe foraminal narrowing and moderate lateral recess stenosis.  There is potential for L4 nerve root compression.  There is no spinal stenosis. 2. At L5-S1, there are degenerative changes causing moderately severe foraminal narrowing and moderate lateral recess stenosis.  There is potential for L5 nerve root compression to either side.  There is no spinal stenosis. 3. Milder degenerative changes at the other lumbar levels do not lead to spinal stenosis or nerve root compression.   COGNITION: Overall cognitive status: Within functional limits for tasks assessed, however pt reporting some memory issues.     SENSATION: Light touch: Reports numbness/tingling in feet and in thighs, with light touch pt more impaired distally in RLE  Proprioception: WFL at bilat ankles   COORDINATION: Heel to shin: WFL bilat   LOWER EXTREMITY MMT:    MMT Right Eval Left Eval  Hip flexion 4/5 4-/5  Hip extension    Hip abduction 5/5 5/5  Hip adduction 5/5 5/5  Hip internal rotation    Hip external rotation    Knee flexion 5/5 5/5  Knee extension 4/5 4+/5  Ankle dorsiflexion 3-/5 3-/5  Ankle plantarflexion    Ankle inversion    Ankle eversion    (Blank rows = not tested)  Strength testing performed in sitting.   TRANSFERS: Assistive device utilized: None  Sit to stand: SBA Stand to sit: SBA From standard chair can perform without UE support, but reports at home has difficulty getting up from lower surfaces like the couch.   GAIT: Gait pattern: decreased ankle  dorsiflexion- Right, decreased ankle dorsiflexion- Left, Right steppage, Left steppage, narrow BOS, poor foot clearance- Right, and poor foot clearance- Left Distance walked: Clinic distances  Assistive device utilized: None Level of assistance: SBA and CGA Comments: Pt with bilat foot drop and unsteadiness with gait with some staggering requiring CGA. Pt with tendency to almost scissor at times and have a narrow BOS.    FUNCTIONAL TESTS:  5 times sit to stand: 15.63 seconds with no UE support  Timed up and go (TUG): 9 seconds, cog TUG: 14.85 seconds 10 meter walk test: 11.16 seconds = 2.93 ft/sec with no AD   Feet together EO on level ground: 30 seconds, on air ex: 30 seconds  M-CTSIB (with feet hip width distance)  Condition 1: Firm Surface, EO 30 Sec, Normal Sway  Condition 2: Firm Surface, EC 30 Sec, Mild Sway, pt shifting weight posteriorly on heels  Condition 3: Foam Surface, EO 30 Sec, Mild Sway  Condition 4: Foam Surface, EC 3 Sec, pt losing balance to the R.      TODAY'S TREATMENT: DATE: 09/22/22  [facility] PT Assessment -  09/22/22 0001       Standardized Balance Assessment   Standardized Balance Assessment Dynamic Gait Index      Dynamic Gait Index   Level Surface Normal    Change in Gait Speed Normal    Gait with Horizontal Head Turns Mild Impairment    Gait with Vertical Head Turns Mild Impairment    Gait and Pivot Turn Normal    Step Over Obstacle Normal    Step Around Obstacles Normal    Steps Normal    Total Score 22           Goals: assessed, see below  Neuro Re-ed:   Step over hurdle and back 10x each LE   Half foam roller: -static stand 30 seconds -heel toe rock 15x   Airex balance beam:  -lateral stepping 6x length of // bars ; no UE support  -tandem walk 6x length of // bar with UE support -chest press with ball 10x   Bosu ball round side up: -UE support forward lunges 10x each side -lateral lunge 10x each side   Access Code: [email or web address] URL: [email or web address] Date: 09/10/2022 Prepared by: Janna Arch  Exercises - Seated Gastroc Stretch with Strap  - 1 x daily - 7 x weekly - 2 sets - 2 reps - 30 hold - Gastroc Stretch with Foot at [place]  - 1 x daily - 7 x weekly - 2 sets - 10 reps - 5 hold   PATIENT EDUCATION:  Education details: updated HEP, instructed in regressing VORx1 to seated with slower speed of head turns until not as symptom provoking Person educated: Patient Education method: Explanation, Demonstration, Verbal cues, and Handouts Education comprehension: verbalized understanding, returned demonstration, and needs further education  HOME EXERCISE PROGRAM: 09/01/22: Access Code: [email or web address] URL: [email or web address] Date: 09/01/2022 Prepared by: Ricard Dillon  Exercises - Seated Cervical Sidebending Stretch  - 1 x daily - 7 x weekly - 2 sets - 2 reps - 30 seconds hold - Seated Assisted Cervical Rotation with Towel  - 1 x daily - 7 x weekly - 2 sets - 2 reps - 30 seconds hold   Morning/ In Bed HEP Access Code: [email or web address] URL:  [email or web address] Date: 07/22/2022 Prepared by: Rivka Barbara  Exercises - Supine Single Knee to Chest Stretch  - 1 x daily - 7 x weekly - 3 sets - 10 reps - 30  seconds  hold - Supine Lower Trunk Rotation  - 1 x daily - 7 x weekly - 1 sets - 10 reps - 5 sec hold - Supine Bridge with Resistance Band  - 1 x daily - 7 x weekly - 2 sets - 10 reps - Clamshell  - 1 x daily - 7 x weekly - 2 sets - 10 reps   HEP rest of the day:   Access Code: [email or web address] URL: [email or web address] Date: 07/22/2022 Prepared by: Rivka Barbara  Exercises - Seated Long Arc Quad  - 1 x daily - 5 x weekly - 3 sets - 10 reps - 5 hold - Sit to Stand Without Arm Support  - 1 x daily - 5 x weekly - 2 sets - 8 reps - Romberg Stance with Eyes Closed  - 1 x daily - 5 x weekly - 1 sets - 3 reps - 30 hold - Tandem Stance  - 1 x daily - 5 x weekly - 1 sets - 3 reps - 30 hold - Standing Single Leg Stance with Counter Support  - 1 x daily - 5 x weekly - 1 sets - 3 reps - 30 hold - Seated Gaze Stabilization with Head Nod  - 1 x daily - 7 x weekly - 3 sets - 2 reps - 30 second hold - Seated Ankle Alphabet  - 1 x daily - 7 x weekly - 3 sets - Ankle Eversion with Resistance  - 1 x daily - 7 x weekly - 2 sets - 10 reps - 5 sec  hold - Ankle Dorsiflexion with Resistance  - 1 x daily - 7 x weekly - 2 sets - 10 reps - 5 sec hold  Access Code: [facility] URL: [email or web address] Date: 09/10/2022 Prepared by: Janna Arch  Exercises - Seated Gastroc Stretch with Strap  - 1 x daily - 7 x weekly - 2 sets - 2 reps - 30 hold - Gastroc Stretch with Foot at [place]  - 1 x daily - 7 x weekly - 2 sets - 10 reps - 5 hold GOALS: Goals reviewed with patient? Yes  SHORT TERM GOALS: Target date: 08/25/2022  Pt will be independent with initial HEP in order to build upon functional gains made in therapy. Baseline: 12/28: Pt reports "kind of on my own I have gone back to the beginning" regarding  resuming his initial HEP and that he feels confident/indep performing it3/4 HEP compliant Goal status: MET  2.  Patient will increase Dynamic Gait Index to >19/24 as to demonstrate reduced falls risk and improved dynamic gait balance for better safety with community/home ambulation.  Baseline: 06/03/22: 12/24; 07/17/22: deferred 3/4: 22/24  Goal status:MET  3.  Pt will improve 5x sit<>stand to less than or equal to 14 sec to demonstrate improved functional strength and transfer efficiency.  Baseline: 15.63 seconds with no UE support; 12/28:  17 seconds hands free 3/4: 11 seconds Goal status: MET  4.  Pt will ambulate at least 300' over level indoor/outdoor paved surfaces with supervision and LRAD in order to demo improved safety with mobility.  Baseline: No AD over level surfaces, supervision/CGA and unsteadiness.; 12/28: pt ambulates 149 ft with SPC, CGA prior to requiring rest break due to L glute spasm, performed over even surface 3/4: 400 ft no AD  Goal status: MET  LONG TERM GOALS: Target date: 09/22/2022   Pt will be independent with final HEP in order to build upon functional gains made in therapy. Baseline: 07/17/22:  pt reports performing initial HEP at the moment 3/4: HEP compliant  Goal status:MET   2.  Patient will increase dynamic gait index score to >19/24 as to demonstrate reduced fall risk and improved dynamic gait balance for better safety with community/home ambulation.  Baseline: 06/03/22: 12/24; 07/17/22: deferred 07/22/22: 15/24 3/4: 22/24 Goal status: MET  3.  Pt will improve condition 4 of mCTSIB to at least 12 seconds in order to demo improved vestibular input for balance.  Baseline: 3 seconds, pt losing balance to R.; 12/28: pt able to maintain all 4 conditions without LOB, but does exhibit increased sway in condition 1 and 2 Goal status: MET  4.  Pt will improve gait speed with LRAD or appropriate braces/no AD to at least 3.3 ft/sec in order to demo improved  community mobility.   Baseline: 11.16 seconds = 2.93 ft/sec with no AD; 12/28: 2.2 ft/sec with SPC (using due to unsteadiness s/p recent falls) 3/4: 7 seconds Goal status: MET  5.  Pt will ambulate at least 500' outdoors over paved/grass surfaces with LRAD and supervision in order to demo improved community mobility.  Baseline: 12/28: pt reports he does not think he could walk over uneven surfaces due to pain and unsteadiness 3/4: patient reports walking around his yard without issue.  Goal status: MET   ASSESSMENT:  CLINICAL IMPRESSION: Patient has met his goals and is ready for discharge. He is independent with Hep and is able to self correct instability. I am happy to see this patient again in the future as needed.      OBJECTIVE IMPAIRMENTS: Abnormal gait, decreased activity tolerance, decreased balance, decreased knowledge of use of DME, difficulty walking, decreased ROM, decreased strength, and impaired sensation.   ACTIVITY LIMITATIONS: bending, stairs, transfers, and locomotion level  PARTICIPATION LIMITATIONS: community activity and yard work  PERSONAL FACTORS: Past/current experiences, Time since onset of injury/illness/exacerbation, 3+ comorbidities: Arthritis, hx of pancreas cancer, depression, hx of subdural hematoma , hx of hip fracture ~13 yrs ago , and frequent falls (>10 in the past 6 months)  are also affecting patient's functional outcome.   REHAB POTENTIAL: Good  CLINICAL DECISION MAKING: Evolving/moderate complexity  EVALUATION COMPLEXITY: Moderate  PLAN:  PT FREQUENCY: 1x/week  PT DURATION: 8 weeks  PLANNED INTERVENTIONS: Therapeutic exercises, Therapeutic activity, Neuromuscular re-education, Balance training, Gait training, Patient/Family education, Self Care, Stair training, Vestibular training, Orthotic/Fit training, DME instructions, and Re-evaluation  PLAN FOR NEXT SESSION:  Manual therapy for shoulders, cervical paraspinals, regressed VORx1 to  seated with slower speed, balance  [facility]  09/22/22, 4:35 PM

## 2022-09-22 ENCOUNTER — Ambulatory Visit: Payer: BC Managed Care – PPO

## 2022-09-22 ENCOUNTER — Ambulatory Visit: Payer: BC Managed Care – PPO | Attending: Diagnostic Neuroimaging

## 2022-09-22 DIAGNOSIS — M542 Cervicalgia: Secondary | ICD-10-CM

## 2022-09-22 DIAGNOSIS — M5459 Other low back pain: Secondary | ICD-10-CM | POA: Insufficient documentation

## 2022-09-22 DIAGNOSIS — M6281 Muscle weakness (generalized): Secondary | ICD-10-CM | POA: Diagnosis present

## 2022-09-22 DIAGNOSIS — R262 Difficulty in walking, not elsewhere classified: Secondary | ICD-10-CM | POA: Insufficient documentation

## 2022-09-24 ENCOUNTER — Ambulatory Visit: Payer: BC Managed Care – PPO

## 2022-09-29 ENCOUNTER — Ambulatory Visit: Payer: BC Managed Care – PPO

## 2022-09-30 ENCOUNTER — Ambulatory Visit: Payer: BC Managed Care – PPO

## 2022-10-01 ENCOUNTER — Ambulatory Visit: Payer: BC Managed Care – PPO

## 2022-10-01 ENCOUNTER — Telehealth: Payer: Self-pay

## 2022-10-01 NOTE — Telephone Encounter (Signed)
Brace order was faxed to [facility]

## 2022-10-06 ENCOUNTER — Ambulatory Visit: Payer: BC Managed Care – PPO

## 2022-10-06 ENCOUNTER — Encounter: Payer: Self-pay | Admitting: *Deleted

## 2022-10-07 ENCOUNTER — Ambulatory Visit: Payer: BC Managed Care – PPO | Admitting: Anesthesiology

## 2022-10-07 ENCOUNTER — Encounter
Admission: RE | Disposition: A | Discharge status: Home / Self Care | Payer: Self-pay | Source: Ambulatory Visit | Attending: Gastroenterology

## 2022-10-07 ENCOUNTER — Ambulatory Visit
Admission: RE | Admit: 2022-10-07 | Discharge: 2022-10-07 | Disposition: A | Discharge status: Home / Self Care | Payer: BC Managed Care – PPO | Source: Ambulatory Visit | Attending: Gastroenterology | Admitting: Gastroenterology

## 2022-10-07 ENCOUNTER — Encounter: Payer: Self-pay | Admitting: *Deleted

## 2022-10-07 DIAGNOSIS — F32A Depression, unspecified: Secondary | ICD-10-CM | POA: Diagnosis not present

## 2022-10-07 DIAGNOSIS — Z9049 Acquired absence of other specified parts of digestive tract: Secondary | ICD-10-CM | POA: Diagnosis not present

## 2022-10-07 DIAGNOSIS — Z87891 Personal history of nicotine dependence: Secondary | ICD-10-CM | POA: Diagnosis not present

## 2022-10-07 DIAGNOSIS — Z1211 Encounter for screening for malignant neoplasm of colon: Secondary | ICD-10-CM | POA: Insufficient documentation

## 2022-10-07 DIAGNOSIS — D122 Benign neoplasm of ascending colon: Secondary | ICD-10-CM | POA: Insufficient documentation

## 2022-10-07 DIAGNOSIS — K5730 Diverticulosis of large intestine without perforation or abscess without bleeding: Secondary | ICD-10-CM | POA: Insufficient documentation

## 2022-10-07 DIAGNOSIS — K640 First degree hemorrhoids: Secondary | ICD-10-CM | POA: Diagnosis not present

## 2022-10-07 DIAGNOSIS — Z8719 Personal history of other diseases of the digestive system: Secondary | ICD-10-CM | POA: Diagnosis present

## 2022-10-07 HISTORY — PX: COLONOSCOPY WITH PROPOFOL: SHX5780

## 2022-10-07 HISTORY — DX: Repeated falls: R29.6

## 2022-10-07 HISTORY — DX: Unspecified fall, initial encounter: W19.XXXA

## 2022-10-07 SURGERY — COLONOSCOPY WITH PROPOFOL
Anesthesia: General

## 2022-10-07 MED ORDER — PROPOFOL 500 MG/50ML IV EMUL
INTRAVENOUS | Status: DC | PRN
  Administered 2022-10-07: 70 mg via INTRAVENOUS
  Administered 2022-10-07: 140 ug/kg/min via INTRAVENOUS

## 2022-10-07 MED ORDER — SODIUM CHLORIDE 0.9 % IV SOLN: INTRAVENOUS | Status: DC

## 2022-10-07 NOTE — Op Note (Signed)
[facility] Gastroenterology Patient Name: Jared [name] Procedure Date: 10/07/2022 9:07 AM MRN: [id number] Account #: [id number] Date of Birth: Sep 02, 1955 Admit Type: Outpatient Age: 67 Room: [facility] ENDO ROOM 1 Gender: Male Note Status: Finalized Instrument Name: Jasper Riling [id number] Procedure:             Colonoscopy Indications:           Surveillance: Personal history of colonic polyps                         (unknown histology) on last colonoscopy more than 5                         years ago Providers:             Andrey Farmer MD, MD Referring MD:          Youlanda Roys. Lovie Macadamia, MD (Referring MD) Medicines:             Monitored Anesthesia Care Complications:         No immediate complications. Estimated blood loss:                         Minimal. Procedure:             Pre-Anesthesia Assessment:                        - Prior to the procedure, a History and Physical was                         performed, and patient medications and allergies were                         reviewed. The patient is competent. The risks and                         benefits of the procedure and the sedation options and                         risks were discussed with the patient. All questions                         were answered and informed consent was obtained.                         Patient identification and proposed procedure were                         verified by the physician, the nurse, the                         anesthesiologist, the anesthetist and the technician                         in the endoscopy suite. Mental Status Examination:                         alert and oriented. Airway Examination: normal  oropharyngeal airway and neck mobility. Respiratory                         Examination: clear to auscultation. CV Examination:                         normal. Prophylactic Antibiotics: The patient does not                          require prophylactic antibiotics. Prior                         Anticoagulants: The patient has taken no anticoagulant                         or antiplatelet agents. ASA Grade Assessment: III - A                         patient with severe systemic disease. After reviewing                         the risks and benefits, the patient was deemed in                         satisfactory condition to undergo the procedure. The                         anesthesia plan was to use monitored anesthesia care                         (MAC). Immediately prior to administration of                         medications, the patient was re-assessed for adequacy                         to receive sedatives. The heart rate, respiratory                         rate, oxygen saturations, blood pressure, adequacy of                         pulmonary ventilation, and response to care were                         monitored throughout the procedure. The physical                         status of the patient was re-assessed after the                         procedure.                        After obtaining informed consent, the colonoscope was                         passed under direct vision. Throughout the procedure,  the patient's blood pressure, pulse, and oxygen                         saturations were monitored continuously. The                         Colonoscope was introduced through the anus and                         advanced to the the cecum, identified by appendiceal                         orifice and ileocecal valve. The colonoscopy was                         somewhat difficult due to a redundant colon. The                         patient tolerated the procedure well. The quality of                         the bowel preparation was adequate to identify polyps.                         The ileocecal valve, appendiceal orifice, and rectum                         were  photographed. Findings:      The perianal and digital rectal examinations were normal.      A 3 mm polyp was found in the ascending colon. The polyp was sessile.       The polyp was removed with a cold snare. Resection and retrieval were       complete. Estimated blood loss was minimal.      Many large-mouthed and small-mouthed diverticula were found in the       sigmoid colon.      Internal hemorrhoids were found during retroflexion. The hemorrhoids       were Grade I (internal hemorrhoids that do not prolapse).      The exam was otherwise without abnormality on direct and retroflexion       views. Impression:            - One 3 mm polyp in the ascending colon, removed with                         a cold snare. Resected and retrieved.                        - Diverticulosis in the sigmoid colon.                        - Internal hemorrhoids.                        - The examination was otherwise normal on direct and                         retroflexion views. Recommendation:        - Discharge patient to home.                        -  Resume previous diet.                        - Continue present medications.                        - Await pathology results.                        - Repeat colonoscopy for surveillance based on                         pathology results.                        - Return to referring physician as previously                         scheduled. Procedure Code(s):     --- Professional ---                        [phone], Colonoscopy, flexible; with removal of                         tumor(s), polyp(s), or other lesion(s) by snare                         technique Diagnosis Code(s):     --- Professional ---                        Z86.010, Personal history of colonic polyps                        D12.2, Benign neoplasm of ascending colon                        K64.0, First degree hemorrhoids                        K57.30, Diverticulosis of large intestine  without                         perforation or abscess without bleeding CPT copyright 2022 American [employer] Association. All rights reserved. The codes documented in this report are preliminary and upon coder review may  be revised to meet current compliance requirements. Andrey Farmer MD, MD 10/07/2022 9:34:45 AM Number of Addenda: 0 Note Initiated On: 10/07/2022 9:07 AM Scope Withdrawal Time: 0 hours 9 minutes 54 seconds  Total Procedure Duration: 0 hours 16 minutes 28 seconds  Estimated Blood Loss:  Estimated blood loss was minimal.      [facility]

## 2022-10-07 NOTE — H&P (Signed)
Outpatient short stay form Pre-procedure 10/07/2022  Lesly Rubenstein, MD  Primary Physician: [name], MD  Reason for visit:  History of polyps  History of present illness:    67 y/o gentleman with history of pancreatic neuroendocrine tumor here for colonoscopy for history of polyps. Last colonoscopy in 2018 was unremarkable. No blood thinners. No family history of GI malignancies. History of cholecystectomy.    Current Facility-Administered Medications:    0.9 %  sodium chloride infusion, , Intravenous, Continuous, Tyjai Matuszak, Hilton Cork, MD, Last Rate: 20 mL/hr at 10/07/22 0851, New Bag at 10/07/22 0851  Medications Prior to Admission  Medication Sig Dispense Refill Last Dose   escitalopram (LEXAPRO) 20 MG tablet Take 1 tablet (20 mg total) by mouth daily. 30 tablet 2 10/06/2022   ibuprofen (ADVIL,MOTRIN) 200 MG tablet Take 400 mg by mouth every 6 (six) hours as needed for mild pain.   Past Week   methocarbamol (ROBAXIN) 500 MG tablet Take 250 mg by mouth at bedtime as needed for muscle spasms.   10/06/2022   Multiple Vitamins-Minerals (CENTRUM SILVER 50+MEN PO) Take 1 tablet by mouth daily.   Past Week   buPROPion (WELLBUTRIN XL) 150 MG 24 hr tablet Take 3 tablets (450 mg total) by mouth daily. 90 tablet 2    Meth-Hyo-M Bl-Na Phos-Ph Sal (URIBEL) 118 MG CAPS Take by mouth.      Methylcellulose, Laxative, 500 MG TABS Take by mouth.      tadalafil (CIALIS) 10 MG tablet Take 0.5 tablets (5 mg total) by mouth daily as needed for erectile dysfunction. 30 tablet 11      No Known Allergies   Past Medical History:  Diagnosis Date   Arthritis    Cancer ([place])    pancreas   Complication of anesthesia    tends to come out of anesthesia during procedure   Depression    Dysphagia    Elevated lipids    Falls    GERD (gastroesophageal reflux disease)    H/O blood clots    superficial blood clot in left leg below knee tx w/ eliquis x 3 mo   History of kidney stones    Kidney  disease    Neuroendocrine tumor    Subdural hematoma ([facility])    hx of   Tobacco abuse    Unsteadiness     Review of systems:  Otherwise negative.    Physical Exam  Gen: Alert, oriented. Appears stated age.  HEENT: PERRLA. Lungs: No respiratory distress CV: RRR Abd: soft, benign, no masses Ext: No edema    Planned procedures: Proceed with colonoscopy. The patient understands the nature of the planned procedure, indications, risks, alternatives and potential complications including but not limited to bleeding, infection, perforation, damage to internal organs and possible oversedation/side effects from anesthesia. The patient agrees and gives consent to proceed.  Please refer to procedure notes for findings, recommendations and patient disposition/instructions.     Lesly Rubenstein, MD [facility]

## 2022-10-07 NOTE — Anesthesia Preprocedure Evaluation (Addendum)
Anesthesia Evaluation  Patient identified by MRN, date of birth, ID band Patient awake    Reviewed: Allergy & Precautions, H&P , NPO status , Patient's Chart, lab work & pertinent test results  Airway Mallampati: II  TM Distance: >3 FB Neck ROM: full    Dental  (+) Missing   Pulmonary former smoker   Pulmonary exam normal        Cardiovascular negative cardio ROS Normal cardiovascular exam     Neuro/Psych  PSYCHIATRIC DISORDERS  Depression    H/o Subdural hematoma TIA   GI/Hepatic negative GI ROS, Neg liver ROS,,,  Endo/Other  negative endocrine ROS    Renal/GU negative Renal ROS  negative genitourinary   Musculoskeletal   Abdominal Normal abdominal exam  (+)   Peds  Hematology negative hematology ROS (+)   Anesthesia Other Findings Past Medical History: No date: Arthritis No date: Cancer ([facility])     Comment:  pancreas No date: Complication of anesthesia     Comment:  tends to come out of anesthesia during procedure No date: Depression No date: Dysphagia No date: Elevated lipids No date: Falls No date: GERD (gastroesophageal reflux disease) No date: H/O blood clots     Comment:  superficial blood clot in left leg below knee tx w/               eliquis x 3 mo No date: History of kidney stones No date: Kidney disease No date: Neuroendocrine tumor No date: Subdural hematoma ([facility])     Comment:  hx of No date: Tobacco abuse No date: Unsteadiness  Past Surgical History: No date: ABDOMINAL SURGERY     Comment:  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 No date: CHOLECYSTECTOMY     Comment:  added umbilical mesh [id number]: COLONOSCOPY WITH PROPOFOL; N/A     Comment:  Procedure: COLONOSCOPY WITH PROPOFOL;  Surgeon: Manya Silvas, MD;  Location: [facility] ENDOSCOPY;  Service:               Endoscopy;  Laterality: N/A; 03/27/2017: ESOPHAGOGASTRODUODENOSCOPY (EGD) WITH PROPOFOL; N/A     Comment:   Procedure: ESOPHAGOGASTRODUODENOSCOPY (EGD) WITH               PROPOFOL;  Surgeon: Manya Silvas, MD;  Location:               [facility] ENDOSCOPY;  Service: Endoscopy;  Laterality: N/A; No date: hip fracture with rod placement; Right 05/24/2021: MASS EXCISION; Left     Comment:  Procedure: EXCISION MASS;  Surgeon: Benjamine Sprague, DO;                Location: [facility] ORS;  Service: General;  Laterality: Left;              Prone postition  BMI    Body Mass Index: 25.08 kg/m      Reproductive/Obstetrics negative OB ROS                             Anesthesia Physical Anesthesia Plan  ASA: 3  Anesthesia Plan: General   Post-op Pain Management:    Induction: Intravenous  PONV Risk Score and Plan: Propofol infusion and TIVA  Airway Management Planned: Natural Airway  Additional Equipment:   Intra-op Plan:   Post-operative Plan:   Informed Consent: I have reviewed the patients History and Physical, chart, labs and discussed the  procedure including the risks, benefits and alternatives for the proposed anesthesia with the patient or authorized representative who has indicated his/her understanding and acceptance.     Dental Advisory Given  Plan Discussed with: CRNA and Surgeon  Anesthesia Plan Comments:         Anesthesia Quick Evaluation

## 2022-10-07 NOTE — Anesthesia Postprocedure Evaluation (Signed)
Anesthesia Post Note  Patient: Jared [name]  Procedure(s) Performed: COLONOSCOPY WITH PROPOFOL  Patient location during evaluation: PACU Anesthesia Type: General Level of consciousness: awake and alert Pain management: pain level controlled Vital Signs Assessment: post-procedure vital signs reviewed and stable Respiratory status: spontaneous breathing, nonlabored ventilation and respiratory function stable Cardiovascular status: blood pressure returned to baseline and stable Postop Assessment: no apparent nausea or vomiting Anesthetic complications: no   No notable events documented.   Last Vitals:  Vitals:   10/07/22 0931 10/07/22 0941  BP: 113/83 123/84  Pulse: (!) 55   Resp: 12   Temp: (!) 36.3 C   SpO2: 98%     Last Pain:  Vitals:   10/07/22 0951  TempSrc:   PainSc: 0-No pain                 [place] Ouch

## 2022-10-07 NOTE — Transfer of Care (Signed)
Immediate Anesthesia Transfer of Care Note  Patient: Jared [name]  Procedure(s) Performed: COLONOSCOPY WITH PROPOFOL  Patient Location: PACU  Anesthesia Type:General  Level of Consciousness: awake  Airway & Oxygen Therapy: Patient Spontanous Breathing and Patient connected to nasal cannula oxygen  Post-op Assessment: Report given to RN and Post -op Vital signs reviewed and stable  Post vital signs: Reviewed and stable  Last Vitals:  Vitals Value Taken Time  BP 113/83 10/07/22 0932  Temp 36.3 C 10/07/22 0931  Pulse 54 10/07/22 0933  Resp 16 10/07/22 0933  SpO2 97 % 10/07/22 0933  Vitals shown include unvalidated device data.  Last Pain:  Vitals:   10/07/22 0931  TempSrc: Temporal  PainSc: Asleep         Complications: No notable events documented.

## 2022-10-07 NOTE — Interval H&P Note (Signed)
History and Physical Interval Note:  10/07/2022 9:02 AM  Jared [name]  has presented today for surgery, with the diagnosis of H/O TA polyps.  The various methods of treatment have been discussed with the patient and family. After consideration of risks, benefits and other options for treatment, the patient has consented to  Procedure(s): COLONOSCOPY WITH PROPOFOL (N/A) as a surgical intervention.  The patient's history has been reviewed, patient examined, no change in status, stable for surgery.  I have reviewed the patient's chart and labs.  Questions were answered to the patient's satisfaction.     Lesly Rubenstein  Ok to proceed with colonoscopy

## 2022-10-08 ENCOUNTER — Ambulatory Visit: Payer: BC Managed Care – PPO

## 2022-10-08 LAB — SURGICAL PATHOLOGY

## 2022-10-13 ENCOUNTER — Ambulatory Visit (INDEPENDENT_AMBULATORY_CARE_PROVIDER_SITE_OTHER): Payer: BC Managed Care – PPO | Admitting: Diagnostic Neuroimaging

## 2022-10-13 ENCOUNTER — Encounter: Payer: Self-pay | Admitting: Diagnostic Neuroimaging

## 2022-10-13 ENCOUNTER — Other Ambulatory Visit: Payer: Self-pay | Admitting: Psychiatry

## 2022-10-13 ENCOUNTER — Ambulatory Visit: Payer: BC Managed Care – PPO | Admitting: Urology

## 2022-10-13 ENCOUNTER — Ambulatory Visit: Payer: BC Managed Care – PPO | Admitting: Physical Therapy

## 2022-10-13 ENCOUNTER — Ambulatory Visit: Payer: BC Managed Care – PPO

## 2022-10-13 VITALS — BP 128/77 | HR 52 | Ht 78.0 in | Wt 218.6 lb

## 2022-10-13 DIAGNOSIS — G629 Polyneuropathy, unspecified: Secondary | ICD-10-CM

## 2022-10-13 MED ORDER — BUPROPION HCL ER (XL) 300 MG PO TB24: 300.0000 mg | ORAL_TABLET | Freq: Every day | ORAL | 1 refills | Status: DC

## 2022-10-13 NOTE — Patient Instructions (Signed)
  GAIT DIFFICULTY (bilateral lower ext weakness and numbness; neuropathy vs lumbar radiculopathies) - idiopathic neuropathy (extensive lab testing unremarkable) - PT exercise - use cane / walker - fall precautions / driving precautions  MEMORY LOSS (depression, sleep apnea; MMSE 27//30) - stable; supportive care; continue with OSA treatment

## 2022-10-13 NOTE — Progress Notes (Signed)
[facility]  PATIENT: Jared [name] DOB: March 15, 1956  REFERRING CLINICIAN: [name], MD HISTORY FROM: patient  REASON FOR VISIT: follow up   HISTORICAL  CHIEF COMPLAINT:  Chief Complaint  Patient presents with   Follow-up    Patient in room #6 and alone. Patient states he has falling since the last visit and had a trip to the ER.    HISTORY OF PRESENT ILLNESS:   UPDATE (10/13/22, VRP): Since last visit, doing better with PT. Testing completed. Using cane. Also has OSA; planning to start CPAP soon.  PRIOR HPI: 67 year old male here for evaluation of gait and balance difficulty and memory loss.  Symptoms have been present since 2021.  He has developed numbness in his feet, gait and balance difficulty.  Has difficulty in the shower when he closes eyes.  He feels like he is drunk when he walks.  He tends to lose his balance.  He is fallen 10 times last few months.  Also has some issues with neck pain and low back pain.  Has had some issues with memory loss issue.  Has had 40-year history of depression, currently on medications.   REVIEW OF SYSTEMS: Full 14 system review of systems performed and negative with exception of: as per HPI.  ALLERGIES: No Known Allergies  HOME MEDICATIONS: Outpatient Medications Prior to Visit  Medication Sig Dispense Refill   buPROPion (WELLBUTRIN XL) 150 MG 24 hr tablet Take 3 tablets (450 mg total) by mouth daily. (Patient taking differently: Take 300 mg by mouth daily.) 90 tablet 2   escitalopram (LEXAPRO) 20 MG tablet Take 1 tablet (20 mg total) by mouth daily. 30 tablet 2   ibuprofen (ADVIL,MOTRIN) 200 MG tablet Take 400 mg by mouth every 6 (six) hours as needed for mild pain.     Meth-Hyo-M Bl-Na Phos-Ph Sal (URIBEL) 118 MG CAPS Take by mouth.     methocarbamol (ROBAXIN) 500 MG tablet Take 250 mg by mouth at bedtime as needed for muscle spasms.     Methylcellulose, Laxative, 500 MG TABS Take by mouth.     Multiple  Vitamins-Minerals (CENTRUM SILVER 50+MEN PO) Take 1 tablet by mouth daily.     tadalafil (CIALIS) 10 MG tablet Take 0.5 tablets (5 mg total) by mouth daily as needed for erectile dysfunction. 30 tablet 11   No facility-administered medications prior to visit.    PAST MEDICAL HISTORY: Past Medical History:  Diagnosis Date   Arthritis    Cancer ([place])    pancreas   Complication of anesthesia    tends to come out of anesthesia during procedure   Depression    Dysphagia    Elevated lipids    Falls    GERD (gastroesophageal reflux disease)    H/O blood clots    superficial blood clot in left leg below knee tx w/ eliquis x 3 mo   History of kidney stones    Kidney disease    Neuroendocrine tumor    Subdural hematoma ([facility])    hx of   Tobacco abuse    Unsteadiness     PAST SURGICAL HISTORY: Past Surgical History:  Procedure Laterality Date   ABDOMINAL SURGERY     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   CHOLECYSTECTOMY     added umbilical mesh   COLONOSCOPY WITH PROPOFOL N/A 03/27/2017   Procedure: COLONOSCOPY WITH PROPOFOL;  Surgeon: Manya Silvas, MD;  Location[facility] END[facility]COPY;  Service: Endoscopy;  Laterality: N/A;   COLONOSCOPY WITH PROPOFOL  N/A 10/07/2022   Procedure: COLONOSCOPY WITH PROPOFOL;  Surgeon: Lesly Rubenstein, MD;  Location: [facility] ENDOSCOPY;  Service: Endoscopy;  Laterality: N/A;   ESOPHAGOGASTRODUODENOSCOPY (EGD) WITH PROPOFOL N/A 03/27/2017   Procedure: ESOPHAGOGASTRODUODENOSCOPY (EGD) WITH PROPOFOL;  Surgeon: Manya Silvas, MD;  Location: [facility] ENDOSCOPY;  Service: Endoscopy;  Laterality: N/A;   hip fracture with rod placement Right    MASS EXCISION Left 05/24/2021   Procedure: EXCISION MASS;  Surgeon: Benjamine Sprague, DO;  Location: [facility] ORS;  Service: General;  Laterality: Left;  Prone postition    FAMILY HISTORY: Family History  Problem Relation Age of Onset   Cancer Mother    Depression Mother    Stroke Father    Depression Father     Aneurysm Father    Depression Sister    Depression Sister    Cancer Brother    Depression Brother     SOCIAL HISTORY: Social History   Socioeconomic History   Marital status: Married    Spouse name: Not on file   Number of children: 2   Years of education: Not on file   Highest education level: Associate degree: occupational, Hotel manager, or vocational program  Occupational History   Not on file  Tobacco Use   Smoking status: Former    Packs/day: 1.00    Years: 45.00    Additional pack years: 0.00    Total pack years: 45.00    Types: Cigarettes    Quit date: 01/30/2019    Years since quitting: 3.7   Smokeless tobacco: Never  Vaping Use   Vaping Use: Former  Substance and Sexual Activity   Alcohol use: No   Drug use: No   Sexual activity: Yes    Birth control/protection: None  Other Topics Concern   Not on file  Social History Narrative   Lives with wife   Social Determinants of Health   Financial Resource Strain: Not on file  Food Insecurity: Not on file  Transportation Needs: Not on file  Physical Activity: Not on file  Stress: Not on file  Social Connections: Not on file  Intimate Partner Violence: Not on file     PHYSICAL EXAM  GENERAL EXAM/CONSTITUTIONAL: Vitals:  Vitals:   10/13/22 1355  BP: 128/77  Pulse: (!) 52  Weight: 218 lb 9.6 oz (99.2 kg)  Height: 6\' 6"  (1.981 m)   Body mass index is 25.26 kg/m. Wt Readings from Last 3 Encounters:  10/13/22 218 lb 9.6 oz (99.2 kg)  10/07/22 217 lb (98.4 kg)  08/22/22 223 lb (101.2 kg)   Patient is in no distress; well developed, nourished and groomed; neck is supple  CARDIOVASCULAR: Examination of carotid arteries is normal; no carotid bruits Regular rate and rhythm, no murmurs Examination of peripheral vascular system by observation and palpation is normal  EYES: Ophthalmoscopic exam of optic discs and posterior segments is normal; no papilledema or hemorrhages No results  found.  MUSCULOSKELETAL: Gait, strength, tone, movements noted in Neurologic exam below  NEUROLOGIC: MENTAL STATUS:     05/19/2022    9:57 AM  MMSE - Mini Mental State Exam  Orientation to time 5  Orientation to Place 5  Registration 3  Attention/ Calculation 4  Recall 2  Language- name 2 objects 1  Language- repeat 1  Language- follow 3 step command 3  Language- read & follow direction 1  Write a sentence 1  Copy design 1  Total score 27   awake, alert, oriented to person, place and time  recent and remote memory intact normal attention and concentration language fluent, comprehension intact, naming intact fund of knowledge appropriate  CRANIAL NERVE:  2nd - no papilledema on fundoscopic exam 2nd, 3rd, 4th, 6th - pupils equal and reactive to light, visual fields full to confrontation, extraocular muscles intact, no nystagmus 5th - facial sensation symmetric 7th - facial strength symmetric 8th - hearing intact 9th - palate elevates symmetrically, uvula midline 11th - shoulder shrug symmetric 12th - tongue protrusion midline  MOTOR:  normal bulk and tone, full strength in the BUE BILATERAL HIP FLEXION (R 4+, L 4) FOOT DF (R 3+, L 3-)  SENSORY:  normal and symmetric to light touch, temperature, vibration; ABSENT VIB AT TOES AND ANKLES  COORDINATION:  finger-nose-finger, fine finger movements normal  REFLEXES:  deep tendon reflexes TRACE and symmetric; ABSENT AT ANKLES  GAIT/STATION:  WIDE GAIT; STEPPAGE GAIT; BILATERAL FOOT DROP; UNSTEADY; USING CANE     DIAGNOSTIC DATA (LABS, IMAGING, TESTING) - I reviewed patient records, labs, notes, testing and imaging myself where available.  Lab Results  Component Value Date   WBC 6.7 05/15/2021   HGB 14.8 05/15/2021   HCT 42.7 05/15/2021   MCV 95.3 05/15/2021   PLT 251 05/15/2021      Component Value Date/Time   NA 138 08/22/2008 0525   K 4.2 08/22/2008 0525   CL 105 08/22/2008 0525   CO2 28 08/22/2008  0525   GLUCOSE 114 (H) 08/22/2008 0525   BUN 9 08/22/2008 0525   CREATININE 0.90 10/05/2020 1359   CALCIUM 8.5 08/22/2008 0525   PROT 6.8 05/19/2022 0959   GFRNONAA >60 08/22/2008 0525   GFRAA  08/22/2008 0525    >60        The eGFR has been calculated using the MDRD equation. This calculation has not been validated in all clinical situations. eGFR's persistently <60 mL/min signify possible Chronic Kidney Disease.   No results found for: "CHOL", "HDL", "[place]", "LDLDIRECT", "TRIG", "CHOLHDL" Lab Results  Component Value Date   HGBA1C 6.2 (H) 05/19/2022   Lab Results  Component Value Date   VITAMINB12 384 05/19/2022   Lab Results  Component Value Date   TSH 0.850 05/19/2022      Component Ref Range & Units 1 mo ago  Interpretation Negative Negative  Comment: A negative paraneoplastic autoantibody result does not rule out all clinically relevant antibodies. If indicated, a phenotype- specific profile (For example, Encephalopathy, dementia, myelopathy, or axonal neuropathy) should be considered.        Component Ref Range & Units 1 mo ago  Lead, Blood 0.0 - 3.4 ug/dL <1.0  Comment: Testing performed by Inductively coupled Estate manager/land agent.                           Environmental Exposure:                            WHO Recommendation     <5.0                           Occupational Exposure:                            OSHA Lead Std          40.0  BEI                    30.0                                 Detection Limit =  1.0  Arsenic 0 - 9 ug/L 3  Comment:                                 Detection Limit = 1  Mercury 0.0 - 14.9 ug/L 1.0  Comment:                         Environmental Exposure:  <15.0                         Occupational Exposure:                          BEI - Inorganic Mercury: 15.0                                 Detection Limit =  1.0  Cadmium 0.0 - 1.2 ug/L <0.5  Comment:                             Environmental Exposure:                             Nonsmokers      0.3 - 1.2                             Smokers         0.6 - 3.9                            Occupational Exposure:                             OSHA Cadmium Std      5.0                             BEI                   5.0                                 Detection Limit = 0.5     Component 4 mo ago  Homogeneous Pattern 1:80  Comment: ICAP nomenclature: AC-1  Speckled Pattern 1:80  Comment: ICAP nomenclature: AC-2,4,5,29  Note: Comment  Comment: Pattern              Potential Disease Association -------------  --------------------------------------------- Homogeneous    Systemic Lupus Erythematosus, Drug Induced                Systemic Lupus Erythematosus, Chronic                Autoimmune hepatitis, Juvenile Idiopathic  Arthritis -------------  --------------------------------------------- Speckled       Sjogren Syndrome, Systemic Lupus                Erythematosus, Subacute Cutaneous Lupus,                Neonatal Lupus, Congenital Heart Block,                Mixed Connective Tissue Disease,                Scleroderma-diffuse, Scleroderma-Autoimmune                Myositis Overlap Syndrome, Systemic Lupus                Erythematosus-Scleroderma-Autoimmune                Myositis Overlap Syndrome, Systemic                Autoimmune Rheumatic Disease,                Undifferentiated Connective Tissue Disease -------------  --------------------------------------------- Nucleolar      Systemic Sclerosis, Scleroderma-Autoimmune                Myositis Overlap Syndrome, Sjogren                Syndrome, Raynaud phenomenon, Pulmonary                Arterial Hypertension, Systemic Autoimmune                Rheumatic Disease, Cancer -------------  --------------------------------------------- Centromere     Scleroderma-CREST, Limited Cutaneous SSc,                Raynaud's Phenomenon, Primary Biliary                 Cholangitis -------------  --------------------------------------------- Nuclear Dot    Primary Biliary Cholangitis -------------  --------------------------------------------- Nuclear        Primary Biliary Cholangitis, Autoimmune Membrane       Hepatitis/Liver disease, Systemic Autoimmune                Rheumatic Disease, Autoimmune Cytopenias,                Linear Scleroderma, Antiphospholipid Syndrome   Component Ref Range & Units 4 mo ago  ENA SSA (RO) Ab 0.0 - 0.9 AI <0.2  ENA SSB (LA) Ab 0.0 - 0.9 AI <0.2   Component Ref Range & Units 4 mo ago  ANA Titer 1 Positive Abnormal   Comment:                                      Negative   <1:80                                      Borderline  1:80                                      Positive   >1:80  Rhuematoid fact SerPl-aCnc <14.0 IU/mL [id number]  Cyclic Citrullin Peptide Ab 0 - 19 units <1  Comment:  Negative               <20                           Weak positive      20 - 39                           Moderate positive  40 - 59                           Strong positive        >59     03/27/20 MRI BRAIN [I reviewed images myself and agree with interpretation. Dural thickening, not clearly subdural hematoma as per addendum. -VRP]  1. Positive for diffuse bilateral subdural hematoma versus dural thickening, 2-3 mm in thickness throughout both the entire posterior fossa and bilateral supratentorial brain. Follow-up noncontrast Head CT might be able to confirm hyperdense blood products, although small SDH this size will frequently be occult by CT. Follow-up Post-contrast brain MRI would be valuable although a degree of dural thickening is typically seen with SDH. Otherwise CSF analysis might also be valuable. 2. No significant intracranial mass effect or other acute intracranial abnormality. 3. MRAs today are reported separately.  ADDENDUM: Study discussed by telephone with Dr. Maudry Diego  [facility] on 03/27/2020 at 1602 hours. We agreed that the signal on MRI and MRA favors diffuse pachymeningeal thickening over widespread trace subdural blood. And we briefly discussed further investigation as well as the broad differential diagnosis of such dural thickening.   03/27/20 MRA head 1. Negative intracranial MRA aside from a degree of generalized arterial tortuosity. 2. See also brain MRI today reported separately.  03/27/20 MRA neck Negative noncontrast Neck MRA.  06/09/22 This MRI of the brain with and without contrast shows the following: The supratentorial and infratentorial  pachymeninges are thickened (2.5 to 3 mm) with homogenous enhancement..  The thickening is similar to the MRI from 03/27/2020.  This is commonly seen with intracranial hypotension though could also be seen with meningitis. Minimal chronic microvascular ischemic changes that are typical for age.  06/09/22 This MRI of the lumbar spine without contrast shows the following: At L4-L5, there is severe loss of disc height and other degenerative change causing moderately severe foraminal narrowing and moderate lateral recess stenosis.  There is potential for L4 nerve root compression.  There is no spinal stenosis. At L5-S1, there are degenerative changes causing moderately severe foraminal narrowing and moderate lateral recess stenosis.  There is potential for L5 nerve root compression to either side.  There is no spinal stenosis. Milder degenerative changes at the other lumbar levels do not lead to spinal stenosis or nerve root compression.  08/22/22 EMG/NCS (Dr. Krista Blue) - This is an abnormal study.  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  In addition, there is also evidence of mild bilateral median neuropathy across the wrist, right worse than left.    ASSESSMENT AND PLAN  67 y.o. year old male here with:   Dx:  1. Neuropathy      PLAN:  GAIT DIFFICULTY (bilateral lower  ext weakness and numbness; idiopathic neuropathy vs lumbar radiculopathies) - idiopathic neuropathy (extensive lab testing unremarkable) - PT exercise - use cane / walker - fall precautions / driving precautions  MEMORY LOSS (depression, sleep apnea; MMSE 27//30) - stable; supportive care;  continue with OSA treatment  DURAL THICKENING (non-specific; not clearly subdural hematoma) - monitor  Return for return to PCP, pending if symptoms worsen or fail to improve.   [name], MD [id number], [id number] PM Certified in Neurology, Neurophysiology and Neuroimaging  [facility] [address], [place], [place] [address] [phone]

## 2022-10-15 ENCOUNTER — Ambulatory Visit: Payer: BC Managed Care – PPO

## 2022-10-20 ENCOUNTER — Ambulatory Visit: Payer: BC Managed Care – PPO

## 2022-10-22 ENCOUNTER — Ambulatory Visit: Payer: BC Managed Care – PPO

## 2022-10-27 ENCOUNTER — Ambulatory Visit: Payer: BC Managed Care – PPO

## 2022-10-29 ENCOUNTER — Ambulatory Visit: Payer: BC Managed Care – PPO

## 2022-11-03 ENCOUNTER — Ambulatory Visit: Payer: BC Managed Care – PPO

## 2022-11-05 ENCOUNTER — Ambulatory Visit: Payer: BC Managed Care – PPO | Admitting: Urology

## 2022-11-05 ENCOUNTER — Ambulatory Visit: Payer: BC Managed Care – PPO

## 2022-11-05 ENCOUNTER — Encounter: Payer: Self-pay | Admitting: Urology

## 2022-11-05 ENCOUNTER — Ambulatory Visit (INDEPENDENT_AMBULATORY_CARE_PROVIDER_SITE_OTHER): Payer: BC Managed Care – PPO | Admitting: Urology

## 2022-11-05 VITALS — BP 155/81 | HR 50 | Ht 78.0 in | Wt 226.0 lb

## 2022-11-05 DIAGNOSIS — Z125 Encounter for screening for malignant neoplasm of prostate: Secondary | ICD-10-CM

## 2022-11-05 DIAGNOSIS — R300 Dysuria: Secondary | ICD-10-CM

## 2022-11-05 DIAGNOSIS — N529 Male erectile dysfunction, unspecified: Secondary | ICD-10-CM | POA: Diagnosis not present

## 2022-11-05 MED ORDER — TADALAFIL 10 MG PO TABS: 10.0000 mg | ORAL_TABLET | Freq: Every day | ORAL | 11 refills | Status: AC | PRN

## 2022-11-05 MED ORDER — URIBEL 118 MG PO CAPS: 1.0000 | ORAL_CAPSULE | Freq: Every day | ORAL | 11 refills | Status: AC | PRN

## 2022-11-05 NOTE — Patient Instructions (Signed)
Eating Plan for Interstitial Cystitis Interstitial cystitis (IC) is a long-term (chronic) condition that causes pain and pressure in the bladder, the lower abdomen, and the pelvic area. Other symptoms of IC include urinary urgency and frequency. Symptoms tend to come and go. Many people with IC find that certain foods trigger their symptoms. Different foods may be problematic for different people. Some foods are more likely to cause symptoms than others. Learning which foods bother you and which do not can help you come up with an eating plan to manage IC. What are tips for following this plan? You may find it helpful to work with a dietitian. A dietician can help you develop an eating plan by doing an elimination diet. This diet involves: Creating a list of foods that you think trigger your IC symptoms combined with the foods that most commonly trigger symptoms for many people with IC. It may take several months to find out which foods bother you. Eliminating those foods from your diet for about one month, then reintroducing the foods one at a time to see which ones trigger your symptoms. Reading food labels Once you know which foods trigger your IC symptoms, you can avoid them. However, it is also a good idea to read food labels because some foods that trigger your symptoms may be included as ingredients in other foods. These ingredients may include: Soy. Worcestershire sauce. Vinegar. Alcohol. Artificial sweeteners. Monosodium glutamate. Other potential triggers include: Chili peppers. Tomato products. Citrus fruits, flavors, or juices. Shopping Shopping can be a challenge if many foods trigger your IC. When you go grocery shopping, bring a list of the foods you cannot eat. You can get an app for your phone that lets you know which foods are the safest and which you may want to avoid. You can find the app at the Interstitial Cystitis Network website: www.ic-network.com Meal planning Plan  your meals according to the results of your elimination diet. If you have not done an elimination diet, plan meals according to IC food lists recommended by your health care provider or dietitian. These lists tell you which foods are least and most likely to cause symptoms. Avoid certain types of food when you go out to eat, such as pizza and foods typically served at Indian, Mexican, and Thai restaurants. These foods often contain ingredients that can aggravate IC. General information Here are some general guidelines for an IC eating plan: Do not eat large portions. Drink plenty of fluids with your meals. Do not eat foods that are high in sugar, salt, or saturated fat. Choose whole fruits instead of juice. Eat a colorful variety of vegetables. What foods should I eat? For people with IC, the best diet is a balanced one that includes things from all the food groups. Even if you have to avoid certain foods, there are still plenty of healthy choices in each group. The following are some foods that are least bothersome and may be safest to eat: Fruits Bananas. Blueberries and blueberry juice. Melons. Pears. Apples. Dates. Prunes. Raisins. Apricots. Vegetables Asparagus. Avocado. Celery. Beets. Bell peppers. Black olives. Broccoli. Brussels sprouts. Cabbage. Carrots. Cauliflower. Cucumber. Eggplant. Green beans. Potatoes. Radishes. Spinach. Squash. Turnips. Zucchini. Mushrooms. Peas. Grains Oats. Rice. Bran. Oatmeal. Whole wheat bread. Meats and other proteins Beef. Fish and other seafood. Eggs. Nuts. Peanut butter. Pork. Poultry. Lamb. Garbanzo beans. Pinto beans. Dairy Whole or low-fat milk. American, mozzarella, mild cheddar, feta, ricotta, and cream cheeses. The items listed above may not be a   complete list of foods and beverages you can eat. Contact a dietitian for more information. What foods should I avoid? You should avoid any foods that seem to trigger your symptoms. It is also a good  idea to avoid foods that are most likely to cause symptoms in many people with IC. These include the following: Fruits Citrus fruits, including lemons, limes, oranges, and grapefruit. Cranberries. Strawberries. Pineapple. Kiwi. Vegetables Chili peppers. Onions. Sauerkraut. Tomato and tomato products. Pickles. Grains You do not need to avoid any type of grain unless it triggers your symptoms. Meats and other proteins Precooked or cured meats, such as sausages or meat loaves. Soy products. Dairy Chocolate ice cream. Processed cheese. Yogurt. Beverages Alcohol. Chocolate drinks. Coffee. Cranberry juice. Carbonated drinks. Tea (black, green, or herbal). Tomato juice. Sports drinks. The items listed above may not be a complete list of foods and beverages you should avoid. Contact a dietitian for more information. Summary Many people with IC find that certain foods trigger their symptoms. Different foods may be problematic for different people. You may find it helpful to work with a dietitian to do an elimination diet and come up with an eating plan that is right for you. Plan your meals according to the results of your elimination diet. If you have not done an elimination diet, plan your meals using IC food lists. These lists tell you which foods are least and most likely to cause symptoms. The best diet for people with IC is a balanced diet that includes foods from all the food groups. Even if you have to avoid certain foods, there are still plenty of healthy choices in each group. This information is not intended to replace advice given to you by your health care provider. Make sure you discuss any questions you have with your health care provider. Document Revised: 08/11/2021 Document Reviewed: 08/11/2021 Elsevier Patient Education  2023 Elsevier Inc.  

## 2022-11-05 NOTE — Progress Notes (Signed)
   11/05/2022 11:27 AM   Jared [name] Dec 13, 1955 [id number]  Reason for visit: ED, urinary symptoms, PSA screening, history of gross hematuria  HPI: 67 year old male with history of an interstitial cystitis type picture that started in 2020 and symptoms have improved significantly since that time.  Cystoscopy was negative at that time, and he has been using Uribel as needed for flares of bladder pain/dysuria with great results.  He denies any significant urinary complaints today.  He uses the Uribel just a few times a month, he is unsure if there are any exacerbations diet wise.  He started Xarelto for a superficial thrombophlebitis back in March 2022 and had some gross hematuria.  At that time he underwent a CT urogram which was benign, and cystoscopy which showed some friable prostate vessels but was otherwise normal, his hematuria has since resolved since that time and he is no longer on anticoagulation.  He continues on Cialis 10 mg as needed for ED with good results, this was refilled.  PSA normal at 0.64 and stable from 0.61 in 2021.  We reviewed the AUA guidelines regarding screening every 1 to 2 years, and reassurance provided regarding normal PSA.  Consider repeat PSA next year.  -Cialis refilled, 10 mg as needed -Continue Uribel as needed for rare episodes of dysuria, we discussed possible foods that could cause flares of his IC type symptoms -RTC 1 year symptom check  Sondra Come, MD  [facility] [address], [address] [place], [place] [address] [phone]

## 2022-11-10 ENCOUNTER — Ambulatory Visit: Payer: BC Managed Care – PPO

## 2022-11-12 ENCOUNTER — Ambulatory Visit: Payer: BC Managed Care – PPO

## 2022-11-17 ENCOUNTER — Ambulatory Visit: Payer: BC Managed Care – PPO

## 2022-11-19 ENCOUNTER — Ambulatory Visit: Payer: BC Managed Care – PPO

## 2022-11-23 NOTE — Progress Notes (Unsigned)
Virtual Visit via Video Note  I connected with Jared [name] on 11/26/22 at  8:20 AM EDT by a video enabled telemedicine application and verified that I am speaking with the correct person using two identifiers.  Location: Patient: home Provider: office Persons participated in the visit- patient, provider    I discussed the limitations of evaluation and management by telemedicine and the availability of in person appointments. The patient expressed understanding and agreed to proceed.    I discussed the assessment and treatment plan with the patient. The patient was provided an opportunity to ask questions and all were answered. The patient agreed with the plan and demonstrated an understanding of the instructions.   The patient was advised to call back or seek an in-person evaluation if the symptoms worsen or if the condition fails to improve as anticipated.  I provided 15 minutes of non-face-to-face time during this encounter.   Neysa Hotter, MD     [facility] MD/PA/NP OP Progress Note  11/26/2022 9:33 AM Jared [name]  MRN:  [id number]  Chief Complaint:  Chief Complaint  Patient presents with   Follow-up   HPI:  - According to the chart review, the following events have occurred since the last visit: The patient was seen by neurology for gait difficulty, likely secondary to idiopathic neuropathy vs lumbar radiculopathies, memory loss, MMSE 27//30   This is a follow-up appointment for depression and fatigue.  He states that he is doing a part-time.  He is working on the yard work when he has time.  His mother-in-law moved to a facility in [facility], although she used to live with the patient.  Although it has been adjustment, his wife used to work there, and they know is a good place.  He thinks the medication make him a whole different person.  He feels happier, and does not have any death wishes anymore.  He continues to have dizziness and unsteadiness.  He is seeing a PT, and  he has no fall since the last visit.  He states this tends ocher when he turns his head.  He learned an exercise through PT. he started to use CPAP machine for the past month.  He has not noticed much difference on his fatigue.  However, he thinks it has been manageable, and he feels comfortable to stay on the medication as it is. He has good appetite. He denies anxiety or panic attacks. He denies alcohol use, drug use.   Support: Household: wife Marital status: married for 29 years Number of children: 2 (24 son in [place], 2 daughter in [place]) Employment: system admin, part time Education:   Last PCP / ongoing medical evaluation  Wt Readings from Last 3 Encounters:  11/05/22 226 lb (102.5 kg)  10/13/22 218 lb 9.6 oz (99.2 kg)  10/07/22 217 lb (98.4 kg)     Visit Diagnosis:    ICD-10-CM   1. MDD (major depressive disorder), recurrent, in partial remission (HCC)  F33.41 VITAMIN D 25 Hydroxy (Vit-D Deficiency, Fractures)    2. Fatigue, unspecified type  R53.83 VITAMIN D 25 Hydroxy (Vit-D Deficiency, Fractures)      Past Psychiatric History:Please see initial evaluation for full details. I have reviewed the history. No updates at this time.     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   CHOLECYSTECTOMY     added umbilical mesh   COLONOSCOPY WITH PROPOFOL N/A 03/27/2017   Procedure:  COLONOSCOPY WITH PROPOFOL;  Surgeon: Scot Jun, MD;  Location: [facility] ENDOSCOPY;  Service: Endoscopy;  Laterality: N/A;   COLONOSCOPY WITH PROPOFOL N/A 10/07/2022   Procedure: COLONOSCOPY WITH PROPOFOL;  Surgeon: Regis Bill, MD;  Location: [facility] ENDOSCOPY;  Service: Endoscopy;  Laterality: N/A;   ESOPHAGOGASTRODUODENOSCOPY (EGD) WITH PROPOFOL N/A 03/27/2017   Procedure: ESOPHAGOGASTRODUODENOSCOPY (EGD) WITH PROPOFOL;  Surgeon: Scot Jun, MD;  Location: [facility] ENDOSCOPY;  Service: Endoscopy;  Laterality: N/A;   hip fracture with rod placement Right    MASS EXCISION Left 05/24/2021   Procedure: EXCISION MASS;  Surgeon: Sung Amabile, DO;  Location: [facility] ORS;  Service: General;  Laterality: Left;  Prone postition    Family Psychiatric History: Please see initial evaluation for full details. I have reviewed the history. No updates at this time.     Family History:  Family History  Problem Relation Age of Onset   Cancer Mother    Depression Mother    Stroke Father    Depression Father    Aneurysm Father    Depression Sister    Depression Sister    Cancer Brother    Depression Brother     Social History:  Social History   Socioeconomic History   Marital status: Married    Spouse name: Not on file   Number of children: 2   Years of education: Not on file   Highest education level: Associate degree: occupational, Scientist, product/process development, or vocational program  Occupational History   Not on file  Tobacco Use   Smoking status: Former    Packs/day: 1.00    Years: 45.00    Additional pack years: 0.00    Total pack years: 45.00    Types: Cigarettes    Quit date: 01/30/2019    Years since quitting: 3.8    Passive exposure: Past   Smokeless tobacco: Never  Vaping Use   Vaping Use: Former  Substance and Sexual Activity   Alcohol use: No   Drug use: No   Sexual activity: Yes    Birth control/protection: None  Other Topics Concern   Not on file  Social History Narrative    Lives with wife   Social Determinants of Health   Financial Resource Strain: Not on file  Food Insecurity: Not on file  Transportation Needs: Not on file  Physical Activity: Not on file  Stress: Not on file  Social Connections: Not on file    Allergies: No Known Allergies  Metabolic Disorder Labs: Lab Results  Component Value Date   HGBA1C 6.2 (H) 05/19/2022   No results found for: "PROLACTIN" No results found for: "CHOL", "TRIG", "HDL", "CHOLHDL", "VLDL", "LDLCALC" Lab Results  Component Value Date   TSH 0.850 05/19/2022    Therapeutic Level Labs:  No results found for: "LITHIUM" No results found for: "VALPROATE" No results found for: "CBMZ"  Current Medications: Current Outpatient Medications  Medication Sig Dispense Refill   [START ON 12/12/2022] buPROPion (WELLBUTRIN XL) 300 MG 24 hr tablet Take 1 tablet (300 mg total) by mouth daily. 30 tablet 2   [START ON 12/01/2022] escitalopram (LEXAPRO) 20 MG tablet Take 1 tablet (20 mg total) by mouth daily. 30 tablet 2   ibuprofen (ADVIL,MOTRIN) 200 MG tablet Take 400 mg by mouth every 6 (six) hours as needed for mild pain.     Meth-Hyo-M Bl-Na Phos-Ph Sal (URIBEL) 118 MG CAPS Take 1 capsule (118 mg total) by mouth daily as needed (dysuria). 30 capsule 11   methocarbamol (ROBAXIN) 500 MG tablet Take 250 mg by mouth at bedtime as needed for muscle spasms.     Methylcellulose, Laxative, 500 MG TABS Take by mouth.     Multiple Vitamins-Minerals (CENTRUM SILVER 50+MEN PO) Take 1 tablet by mouth daily.     tadalafil (CIALIS) 10 MG tablet Take 1 tablet (10 mg total) by mouth daily as needed for erectile dysfunction. 30 tablet 11   No current facility-administered medications for this visit.     Musculoskeletal: Strength & Muscle Tone:  N/A Gait & Station:  N/A Patient leans: N/A  Psychiatric Specialty Exam: Review of Systems  Psychiatric/Behavioral: Negative.    All other systems reviewed and are negative.   There were no  vitals taken for this visit.There is no height or weight on file to calculate BMI.  General Appearance: Fairly Groomed  Eye Contact:  Good  Speech:  Clear and Coherent  Volume:  Normal  Mood:   happy  Affect:  Appropriate, Congruent, and calm  Thought Process:  Coherent  Orientation:  Full (Time, Place, and Person)  Thought Content: Logical   Suicidal Thoughts:  No  Homicidal Thoughts:  No  Memory:  Immediate;   Good  Judgement:  Good  Insight:  Good  Psychomotor Activity:  Normal  Concentration:  Concentration: Good and Attention Span: Good  Recall:  Good  Fund of Knowledge: Good  Language: Good  Akathisia:  No  Handed:  Right  AIMS (if indicated): not done  Assets:  Communication Skills Desire for Improvement  ADL's:  Intact  Cognition: WNL  Sleep:  Good   Screenings: Mini-Mental    Flowsheet Row Office Visit from 05/19/2022 in [facility]  Total Score (max 30 points ) 27      PHQ2-9    Flowsheet Row Counselor from 06/09/2022 in [facility] Counselor from 09/18/2021 in [facility] Office Visit from 08/19/2021 in [facility]  PHQ-2 Total Score 1 4 4   PHQ-9 Total Score -- 12 14      Flowsheet Row Admission (Discharged) from 10/07/2022 in [facility] Counselor from 09/09/2022 in [facility] ED from 08/21/2022 in [facility] at [facility]  C-SSRS RISK CATEGORY Error: Question 6 not populated No Risk No Risk        Assessment and Plan:  PHILLP GILLIN is a 67 y.o. year old male with a history of depression, sleep apnea on CPAP, arthritis, GERD, pancreatic neuroendocrine tumor s/p excision, who presents for follow up appointment for below.   1. MDD (major depressive disorder), recurrent, in partial remission  (HCC) Acute stressors include: gait imbalances, mother in law moved  to a facility in [facility] Other stressors include:    History: had more than 2 episodes of depression in the past   There has been a steady improvement in depressive symptoms, anxiety, anhedonia since being on the current medication regimen.  Will continue bupropion, Lexapro as maintenance treatment for depression and anxiety.   2. Fatigue, unspecified type Unchanged.  He started to use CPAP machine for the past month.  Coached behavioral activation.  Will obtain lab to rule out medical health issues contributing to his symptoms.    # cognitive impairment Slight improvement since lowering the dose of bupropion. He reports occasional difficulty in remembering names of people.  IADL independent.  Noted that he was evaluated by his neurologist symptoms in 2021 with brain MRI, and labs, which are unremarkable to explain his symptoms.  Will continue to monitor this.    Plan Continue bupropion 300 mg daily (self tapered down from 450 mg, felt zombie at higher dose) - he declined a refill Continue escitalopram 20 mg daily (QTc 412 msec in 04/2021, HR 52 sinus bradycardia)  Obtain lab- vitamin D Next appointment: 8/7 at 8 am, video [email or web address]   The patient demonstrates the following risk factors for suicide: Chronic risk factors for suicide include: psychiatric disorder of depression . Acute risk factors for suicide include: family or marital conflict. Protective factors for this patient include: hope for the future and religious beliefs against suicide. Considering these factors, the overall suicide risk at this point appears to be low. Patient is appropriate for outpatient follow up.     Collaboration of Care: Collaboration of Care: Other reviewed notes in Epic  Patient/Guardian was advised Release of Information must be obtained prior to any record release in order to collaborate their care with an outside provider.  Patient/Guardian was advised if they have not already done so to contact the registration department to sign all necessary forms in order for [place] to release information regarding their care.   Consent: Patient/Guardian gives verbal consent for treatment and assignment of benefits for services provided during this visit. Patient/Guardian expressed understanding and agreed to proceed.    Neysa Hotter, MD 11/26/2022, 9:33 AM

## 2022-11-24 ENCOUNTER — Ambulatory Visit: Payer: BC Managed Care – PPO

## 2022-11-26 ENCOUNTER — Ambulatory Visit: Payer: BC Managed Care – PPO

## 2022-11-26 ENCOUNTER — Encounter: Payer: Self-pay | Admitting: Psychiatry

## 2022-11-26 ENCOUNTER — Telehealth (INDEPENDENT_AMBULATORY_CARE_PROVIDER_SITE_OTHER): Payer: BC Managed Care – PPO | Admitting: Psychiatry

## 2022-11-26 DIAGNOSIS — R5383 Other fatigue: Secondary | ICD-10-CM

## 2022-11-26 DIAGNOSIS — F3341 Major depressive disorder, recurrent, in partial remission: Secondary | ICD-10-CM | POA: Diagnosis not present

## 2022-11-26 MED ORDER — ESCITALOPRAM OXALATE 20 MG PO TABS: 20.0000 mg | ORAL_TABLET | Freq: Every day | ORAL | 2 refills | Status: DC

## 2022-11-26 MED ORDER — BUPROPION HCL ER (XL) 300 MG PO TB24: 300.0000 mg | ORAL_TABLET | Freq: Every day | ORAL | 2 refills | Status: DC

## 2022-12-01 ENCOUNTER — Ambulatory Visit: Payer: BC Managed Care – PPO

## 2022-12-03 ENCOUNTER — Ambulatory Visit: Payer: BC Managed Care – PPO

## 2022-12-08 ENCOUNTER — Ambulatory Visit: Payer: BC Managed Care – PPO

## 2022-12-10 ENCOUNTER — Ambulatory Visit: Payer: BC Managed Care – PPO

## 2022-12-11 ENCOUNTER — Other Ambulatory Visit: Payer: Self-pay | Admitting: Psychiatry

## 2022-12-23 ENCOUNTER — Ambulatory Visit (INDEPENDENT_AMBULATORY_CARE_PROVIDER_SITE_OTHER): Payer: Self-pay | Admitting: Licensed Clinical Social Worker

## 2022-12-23 DIAGNOSIS — Z91199 Patient's noncompliance with other medical treatment and regimen due to unspecified reason: Secondary | ICD-10-CM

## 2022-12-23 NOTE — Progress Notes (Signed)
LCSW counselor attempted to connect with patient for scheduled appointment via MyChart video text request x 2 and email request with no response.   Attempt 1: Text and email: 11:07a  Attempt 2: Text and email: 11:13a  Video session was closed at : 11:19a  Per [facility] policy, after multiple attempts to reach pt unsuccessfully at appointed time--visit will be coded as no show

## 2023-02-19 DIAGNOSIS — D3A8 Other benign neuroendocrine tumors: Secondary | ICD-10-CM | POA: Diagnosis not present

## 2023-02-20 NOTE — Progress Notes (Unsigned)
Virtual Visit via Video Note  I connected with Jared [name] on 02/25/23 at  8:00 AM EDT by a video enabled telemedicine application and verified that I am speaking with the correct person using two identifiers.  Location: Patient: work Provider: office Persons participated in the visit- patient, provider    I discussed the limitations of evaluation and management by telemedicine and the availability of in person appointments. The patient expressed understanding and agreed to proceed.   I discussed the assessment and treatment plan with the patient. The patient was provided an opportunity to ask questions and all were answered. The patient agreed with the plan and demonstrated an understanding of the instructions.   The patient was advised to call back or seek an in-person evaluation if the symptoms worsen or if the condition fails to improve as anticipated.  I provided 10 minutes of non-face-to-face time during this encounter.   Neysa Hotter, MD     [facility] MD/PA/NP OP Progress Note  02/25/2023 9:00 AM Jared [name]  MRN:  [id number]  Chief Complaint:  Chief Complaint  Patient presents with   Follow-up   HPI:  - According to the chart review, the following events have occurred since the last visit: The patient was seen for s history of pancreatic neuroendocrine tumor s/p distal pancreatectomy/splenectomy in 2009  and what has been described as enlarging either retroperitoneal lymph nodes or nodule adjacent to the liver . Pet scan/CT was ordered  The patient returns with a DOTATATE PET to evaluate the lesion in the perigastric/liver area, which suggests a neuroendocrine tumor. Again, there is an enlarged gastrohepatic lymph node with somatostatin receptor uptake. It is stable. We discussed options which include trying to re-biopsy the area or assume it is neuroendocrine tumor. If we assume it is a neuroendocrine tumor, the options are continued observation versus surgical  resection versus initiating a somatostatin analog such as Sandostatin or lanreotide. There is also the lobular tissue in the left upper quadrant although this is thought more likely to be splenic tissue. Again it is slowly growing and could be observed while on or off therapy. He would like to think about his options and will let me know if he wishes to proceed, but otherwise, he has CT scan scheduled for a year from now with Jared [name].   This is a follow-up appointment for depression, fatigue. He apologized for being late for the appointment.  He states that he has been doing well.  He will be retiring this month.  He asks if he can come off bupropion.  He is concerned about memory loss.  He also wonders if the medication is causing increase in appetite.  He cannot stop eating.  He continues to experience fatigue.  He feels tired and take a nap for 1 hour.  He thinks his mood has been good.  He is doing better in the relationship with his wife.  They have more time together now that his mother-in-law will be moving to a retirement center.  He underwent evaluation for possible new endocrine tumor.  He was reassured by the surgeon, and he denies any concern about this at this time.  He sleeps fair.  He denies feeling depressed.  He denies SI.  He denies anxiety.  Although he has not noticed much difference since taking a vitamin D, he feels more energized after going to the gym.  He agrees to continue to be active.    Wt Readings from Last 3 Encounters:  11/05/22 226 lb (102.5 kg)  10/13/22 218 lb 9.6 oz (99.2 kg)  10/07/22 217 lb (98.4 kg)      Support: Household: wife Marital status: married for 29 years Number of children: 2 (24 son in [place], 47 daughter in [place]) Employment: system Corporate treasurer, part time Education:   Last PCP / ongoing medical evaluation    Visit Diagnosis:    ICD-10-CM   1. MDD (major depressive disorder), recurrent, in partial remission (HCC)  F33.41     2. Fatigue,  unspecified type  R53.83     3. Vitamin D deficiency  E55.9       Past Psychiatric History: Please see initial evaluation for full details. I have reviewed the history. No updates at this time.     Past Medical History:  Past Medical History:  Diagnosis Date   Arthritis    Cancer (HCC)    pancreas   Complication of anesthesia    tends to come out of anesthesia during procedure   Depression    Dysphagia    Elevated lipids    Falls    GERD (gastroesophageal reflux disease)    H/O blood clots    superficial blood clot in left leg below knee tx w/ eliquis x 3 mo   History of kidney stones    Kidney disease    Neuroendocrine tumor    Subdural hematoma (HCC)    hx of   Tobacco abuse    Unsteadiness     Past Surgical History:  Procedure Laterality Date   ABDOMINAL SURGERY     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   CHOLECYSTECTOMY     added umbilical mesh   COLONOSCOPY WITH PROPOFOL N/A 03/27/2017   Procedure: COLONOSCOPY WITH PROPOFOL;  Surgeon: Scot Jun, MD;  Location: [facility] ENDOSCOPY;  Service: Endoscopy;  Laterality: N/A;   COLONOSCOPY WITH PROPOFOL N/A 10/07/2022   Procedure: COLONOSCOPY WITH PROPOFOL;  Surgeon: Regis Bill, MD;  Location: [facility] ENDOSCOPY;  Service: Endoscopy;  Laterality: N/A;   ESOPHAGOGASTRODUODENOSCOPY (EGD) WITH PROPOFOL N/A 03/27/2017   Procedure: ESOPHAGOGASTRODUODENOSCOPY (EGD) WITH PROPOFOL;  Surgeon: Scot Jun, MD;  Location: [facility] ENDOSCOPY;  Service: Endoscopy;  Laterality: N/A;   hip fracture with rod placement Right    MASS EXCISION Left 05/24/2021   Procedure: EXCISION MASS;  Surgeon: Sung Amabile, DO;  Location: [facility] ORS;  Service: General;  Laterality: Left;  Prone postition    Family Psychiatric History: Please see initial evaluation for full details. I have reviewed the history. No updates at this time.     Family History:  Family History  Problem Relation Age of Onset   Cancer Mother    Depression  Mother    Stroke Father    Depression Father    Aneurysm Father    Depression Sister    Depression Sister    Cancer Brother    Depression Brother     Social History:  Social History   Socioeconomic History   Marital status: Married    Spouse name: Not on file   Number of children: 2   Years of education: Not on file   Highest education level: Associate degree: occupational, Scientist, product/process development, or vocational program  Occupational History   Not on file  Tobacco Use   Smoking status: Former    Current packs/day: 0.00    Average packs/day: 1 pack/day for 45.0 years (45.0 ttl pk-yrs)    Types: Cigarettes    Start date: 01/29/1974    Quit date: 01/30/2019  Years since quitting: 4.0    Passive exposure: Past   Smokeless tobacco: Never  Vaping Use   Vaping status: Former  Substance and Sexual Activity   Alcohol use: No   Drug use: No   Sexual activity: Yes    Birth control/protection: None  Other Topics Concern   Not on file  Social History Narrative   Lives with wife   Social Determinants of Health   Financial Resource Strain: Not on file  Food Insecurity: Not on file  Transportation Needs: Not on file  Physical Activity: Not on file  Stress: Not on file  Social Connections: Not on file    Allergies: No Known Allergies  Metabolic Disorder Labs: Lab Results  Component Value Date   HGBA1C 6.2 (H) 05/19/2022   No results found for: "PROLACTIN" No results found for: "CHOL", "TRIG", "HDL", "CHOLHDL", "VLDL", "LDLCALC" Lab Results  Component Value Date   TSH 0.850 05/19/2022    Therapeutic Level Labs: No results found for: "LITHIUM" No results found for: "VALPROATE" No results found for: "CBMZ"  Current Medications: Current Outpatient Medications  Medication Sig Dispense Refill   buPROPion (WELLBUTRIN XL) 150 MG 24 hr tablet Take 1 tablet (150 mg total) by mouth daily. 30 tablet 2   escitalopram (LEXAPRO) 20 MG tablet Take 1 tablet (20 mg total) by mouth daily.  30 tablet 2   ibuprofen (ADVIL,MOTRIN) 200 MG tablet Take 400 mg by mouth every 6 (six) hours as needed for mild pain.     Meth-Hyo-M Bl-Na Phos-Ph Sal (URIBEL) 118 MG CAPS Take 1 capsule (118 mg total) by mouth daily as needed (dysuria). 30 capsule 11   methocarbamol (ROBAXIN) 500 MG tablet Take 250 mg by mouth at bedtime as needed for muscle spasms.     Methylcellulose, Laxative, 500 MG TABS Take by mouth.     Multiple Vitamins-Minerals (CENTRUM SILVER 50+MEN PO) Take 1 tablet by mouth daily.     tadalafil (CIALIS) 10 MG tablet Take 1 tablet (10 mg total) by mouth daily as needed for erectile dysfunction. 30 tablet 11   No current facility-administered medications for this visit.     Musculoskeletal: Strength & Muscle Tone:  N/A Gait & Station:  N/A Patient leans: N/A  Psychiatric Specialty Exam: Review of Systems  There were no vitals taken for this visit.There is no height or weight on file to calculate BMI.  General Appearance: Fairly Groomed  Eye Contact:  Good  Speech:  Clear and Coherent  Volume:  Normal  Mood:   good  Affect:  Appropriate, Congruent, and calm  Thought Process:  Coherent  Orientation:  Full (Time, Place, and Person)  Thought Content: Logical   Suicidal Thoughts:  No  Homicidal Thoughts:  No  Memory:  Immediate;   Good  Judgement:  Good  Insight:  Good  Psychomotor Activity:  Normal  Concentration:  Concentration: Good and Attention Span: Good  Recall:  Good  Fund of Knowledge: Good  Language: Good  Akathisia:  No  Handed:  Right  AIMS (if indicated): not done  Assets:  Communication Skills Desire for Improvement  ADL's:  Intact  Cognition: WNL  Sleep:  Good   Screenings: Mini-Mental    Flowsheet Row Office Visit from 05/19/2022 in [facility]  Total Score (max 30 points ) 27      PHQ2-9    Flowsheet Row Counselor from 06/09/2022 in [facility] from  09/18/2021 in [facility] Office Visit from 08/19/2021 in [facility]  PHQ-2 Total Score 1 4 4   PHQ-9 Total Score -- 12 14      Flowsheet Row Admission (Discharged) from 10/07/2022 in [facility] Counselor from 09/09/2022 in [facility] ED from 08/21/2022 in [facility]  C-SSRS RISK CATEGORY Error: Question 6 not populated No Risk No Risk        Assessment and Plan:  Jared [name] is a 67 y.o. year old male with a history of depression, sleep apnea on CPAP, arthritis, GERD, pancreatic neuroendocrine tumor s/p excision, who presents for follow up appointment for below.   1. MDD (major depressive disorder), recurrent, in partial remission (HCC) Acute stressors include: gait imbalances, mother in law moved to a facility in [place] Other stressors include:    History: had more than 2 episodes of depression in the past   Although he denies significant mood symptoms except fatigue, he is concerned about possible adverse reaction of memory changes and weight gain from bupropion.  Although it is less likely that bupropion is causing weight gain, he did report improvement in memory when he tapered down bupropion.  Will do further tapering down bupropion while monitoring relapsing his mood symptoms.  He agrees to contact the office if any change in his mood symptoms.  Will continue Lexapro to target depression and anxiety.   2. Fatigue, unspecified type # vitamin D deficiency- 29.2 12/2022 - uses CPAP machine. Unchanged.  He was found to have a low vitamin D.  He will continue to take vitamin D at this time.  Coached behavioral activation.    # cognitive impairment - he was seen by neurologist. MMSE 27/30 per report 09/2022 Slightly worsening.  He reports occasional difficulty in remembering names of  people.  IADL independent.  Noted that he was evaluated by his neurologist symptoms in 2021 with brain MRI, and labs, which are unremarkable to explain his symptoms.  We make intervention as described above.    Plan Decrease bupropion 150 mg daily 02/2023  (felt zombie at 450 mg)  Continue escitalopram 20 mg daily (QTc 412 msec in 04/2021, HR 52 sinus bradycardia)  Continue Vitamin D supplement Next appointment: 10/16 at 8 am, video [email or web address]   The patient demonstrates the following risk factors for suicide: Chronic risk factors for suicide include: psychiatric disorder of depression . Acute risk factors for suicide include: family or marital conflict. Protective factors for this patient include: hope for the future and religious beliefs against suicide. Considering these factors, the overall suicide risk at this point appears to be low. Patient is appropriate for outpatient follow up.     Collaboration of Care: Collaboration of Care: Other reviewed notes in Epic  Patient/Guardian was advised Release of Information must be obtained prior to any record release in order to collaborate their care with an outside provider. Patient/Guardian was advised if they have not already done so to contact the registration department to sign all necessary forms in order for [place] to release information regarding their care.   Consent: Patient/Guardian gives verbal consent for treatment and assignment of benefits for services provided during this visit. Patient/Guardian expressed understanding and agreed to proceed.    Neysa Hotter, MD 02/25/2023, 9:00 AM

## 2023-02-25 ENCOUNTER — Encounter: Payer: Self-pay | Admitting: Psychiatry

## 2023-02-25 ENCOUNTER — Telehealth (INDEPENDENT_AMBULATORY_CARE_PROVIDER_SITE_OTHER): Payer: BC Managed Care – PPO | Admitting: Psychiatry

## 2023-02-25 DIAGNOSIS — R5383 Other fatigue: Secondary | ICD-10-CM

## 2023-02-25 DIAGNOSIS — E559 Vitamin D deficiency, unspecified: Secondary | ICD-10-CM

## 2023-02-25 DIAGNOSIS — F3341 Major depressive disorder, recurrent, in partial remission: Secondary | ICD-10-CM

## 2023-02-25 MED ORDER — BUPROPION HCL ER (XL) 150 MG PO TB24: 150.0000 mg | ORAL_TABLET | Freq: Every day | ORAL | 2 refills | Status: DC

## 2023-02-25 MED ORDER — ESCITALOPRAM OXALATE 20 MG PO TABS: 20.0000 mg | ORAL_TABLET | Freq: Every day | ORAL | 2 refills | Status: DC

## 2023-02-25 NOTE — Patient Instructions (Signed)
Decrease bupropion 150 mg daily  Continue escitalopram 20 mg daily  Continue Vitamin D supplement Next appointment: 10/16 at 8 am

## 2023-03-04 DIAGNOSIS — G4733 Obstructive sleep apnea (adult) (pediatric): Secondary | ICD-10-CM | POA: Diagnosis not present

## 2023-03-20 ENCOUNTER — Ambulatory Visit
Admission: RE | Admit: 2023-03-20 | Discharge: 2023-03-20 | Disposition: A | Discharge status: Home / Self Care | Payer: Medicare Other | Source: Ambulatory Visit

## 2023-03-20 DIAGNOSIS — R300 Dysuria: Secondary | ICD-10-CM

## 2023-03-20 DIAGNOSIS — R350 Frequency of micturition: Secondary | ICD-10-CM | POA: Diagnosis not present

## 2023-03-20 LAB — POCT URINALYSIS DIP (MANUAL ENTRY)
Bilirubin, UA: NEGATIVE
Blood, UA: NEGATIVE
Glucose, UA: NEGATIVE mg/dL
Ketones, POC UA: NEGATIVE mg/dL
Leukocytes, UA: NEGATIVE
Nitrite, UA: NEGATIVE
Protein Ur, POC: NEGATIVE mg/dL
Spec Grav, UA: 1.01 (ref 1.010–1.025)
Urobilinogen, UA: 0.2 E.U./dL
pH, UA: 5.5 (ref 5.0–8.0)

## 2023-03-20 MED ORDER — NITROFURANTOIN MONOHYD MACRO 100 MG PO CAPS: 100.0000 mg | ORAL_CAPSULE | Freq: Two times a day (BID) | ORAL | 0 refills | Status: AC

## 2023-03-20 NOTE — ED Provider Notes (Addendum)
Jared [name]    CSN: [id number] Arrival date & time: 03/20/23  1542      History   Chief Complaint Chief Complaint  Patient presents with   Urinary Frequency    I have a possible UTI. I've had burning and general discomfort for over a week. - Entered by patient    HPI Jared [name] is a 67 y.o. male.   Patient presents for evaluation of urinary frequency, urgency, dysuria and lower abdominal pressure present for 2 weeks.  Has begun to experience fever and bodyaches beginning today.  Takes a daily Uribel for treatment of dysuria which has been helpful but symptoms returned as soon as the medicine is discontinued.  History of kidney stones but symptoms present differently.  Presence of hematuria, flank pain, penile or testicle swelling.  Additionally has attempted use of Advil and Tylenol.  Blood pressure 167/85 Pulse 65 O2 saturation 93% on room air Temperature 100.1 oral  ALL Medical history and medications were reviewed by this provider with patient  Past Medical History:  Diagnosis Date   Arthritis    Cancer (HCC)    pancreas   Complication of anesthesia    tends to come out of anesthesia during procedure   Depression    Dysphagia    Elevated lipids    Falls    GERD (gastroesophageal reflux disease)    H/O blood clots    superficial blood clot in left leg below knee tx w/ eliquis x 3 mo   History of kidney stones    Kidney disease    Neuroendocrine tumor    Subdural hematoma (HCC)    hx of   Tobacco abuse    Unsteadiness     Patient Active Problem List   Diagnosis Date Noted   Neuropathy 08/22/2022   Gait abnormality 08/22/2022   AAA (abdominal aortic aneurysm) without rupture (HCC) 01/02/2022   RLS (restless legs syndrome) 09/19/2020   Varicose veins of both lower extremities 09/19/2020   TIA (transient ischemic attack) 01/01/2020   History of TIA (transient ischemic attack) 12/27/2019   PVC's (premature ventricular contractions) 12/27/2019    Pill dysphagia 03/17/2017   Hx of colonic polyps 12/05/2016   Irregular heart rate 12/05/2016   Hyperlipidemia 09/07/2015   Tobacco abuse 09/07/2015   Calculus, kidney 04/02/2015   Neuroendocrine tumor 06/22/2008    Past Surgical History:  Procedure Laterality Date   ABDOMINAL SURGERY     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   CHOLECYSTECTOMY     added umbilical mesh   COLONOSCOPY WITH PROPOFOL N/A 03/27/2017   Procedure: COLONOSCOPY WITH PROPOFOL;  Surgeon: Scot Jun, MD;  Location: [facility] ENDOSCOPY;  Service: Endoscopy;  Laterality: N/A;   COLONOSCOPY WITH PROPOFOL N/A 10/07/2022   Procedure: COLONOSCOPY WITH PROPOFOL;  Surgeon: Regis Bill, MD;  Location: [facility] ENDOSCOPY;  Service: Endoscopy;  Laterality: N/A;   ESOPHAGOGASTRODUODENOSCOPY (EGD) WITH PROPOFOL N/A 03/27/2017   Procedure: ESOPHAGOGASTRODUODENOSCOPY (EGD) WITH PROPOFOL;  Surgeon: Scot Jun, MD;  Location: [facility] ENDOSCOPY;  Service: Endoscopy;  Laterality: N/A;   hip fracture with rod placement Right    MASS EXCISION Left 05/24/2021   Procedure: EXCISION MASS;  Surgeon: Sung Amabile, DO;  Location: [facility] ORS;  Service: General;  Laterality: Left;  Prone postition       Home Medications    Prior to Admission medications   Medication Sig Start Date End Date Taking? Authorizing Provider  buPROPion (WELLBUTRIN XL) 150 MG 24 hr tablet Take 1 tablet (  150 mg total) by mouth daily. 02/25/23 05/26/23  Neysa Hotter, MD  escitalopram (LEXAPRO) 20 MG tablet Take 1 tablet (20 mg total) by mouth daily. 02/25/23 05/26/23  Neysa Hotter, MD  ibuprofen (ADVIL,MOTRIN) 200 MG tablet Take 400 mg by mouth every 6 (six) hours as needed for mild pain.    [provider]  Meth-Hyo-M Bl-Na Phos-Ph Sal (URIBEL) 118 MG CAPS Take 1 capsule (118 mg total) by mouth daily as needed (dysuria). 11/05/22   Sondra Come, MD  methocarbamol (ROBAXIN) 500 MG tablet Take 250 mg by mouth at bedtime as needed for muscle  spasms. 04/03/21   [provider]  Methylcellulose, Laxative, 500 MG TABS Take by mouth.    [provider]  Multiple Vitamins-Minerals (CENTRUM SILVER 50+MEN PO) Take 1 tablet by mouth daily.    [provider]  tadalafil (CIALIS) 10 MG tablet Take 1 tablet (10 mg total) by mouth daily as needed for erectile dysfunction. 11/05/22   Sondra Come, MD    Family History Family History  Problem Relation Age of Onset   Cancer Mother    Depression Mother    Stroke Father    Depression Father    Aneurysm Father    Depression Sister    Depression Sister    Cancer Brother    Depression Brother     Social History Social History   Tobacco Use   Smoking status: Former    Current packs/day: 0.00    Average packs/day: 1 pack/day for 45.0 years (45.0 ttl pk-yrs)    Types: Cigarettes    Start date: 01/29/1974    Quit date: 01/30/2019    Years since quitting: 4.1    Passive exposure: Past   Smokeless tobacco: Never  Vaping Use   Vaping status: Former  Substance Use Topics   Alcohol use: No   Drug use: No     Allergies   Patient has no known allergies.   Review of Systems Review of Systems   Physical Exam Triage Vital Signs ED Triage Vitals  Encounter Vitals Group     BP      Systolic BP Percentile      Diastolic BP Percentile      Pulse      Resp      Temp      Temp src      SpO2      Weight      Height      Head Circumference      Peak Flow      Pain Score      Pain Loc      Pain Education      Exclude from Growth Chart    No data found.  Updated Vital Signs There were no vitals taken for this visit.  Visual Acuity Right Eye Distance:   Left Eye Distance:   Bilateral Distance:    Right Eye Near:   Left Eye Near:    Bilateral Near:     Physical Exam Constitutional:      Appearance: Normal appearance.  Eyes:     Extraocular Movements: Extraocular movements intact.  Pulmonary:     Effort: Pulmonary effort is normal.   Abdominal:     General: Abdomen is flat. Bowel sounds are increased.     Palpations: Abdomen is soft.     Tenderness: There is abdominal tenderness in the suprapubic area. There is no right CVA tenderness or left CVA tenderness.  Neurological:  Mental Status: He is alert and oriented to person, place, and time. Mental status is at baseline.      UC Treatments / Results  Labs (all labs ordered are listed, but only abnormal results are displayed) Labs Reviewed  POCT URINALYSIS DIP (MANUAL ENTRY)    EKG   Radiology No results found.  Procedures Procedures (including critical care time)  Medications Ordered in UC Medications - No data to display  Initial Impression / Assessment and Plan / UC Course  I have reviewed the triage vital signs and the nursing notes.  Pertinent labs & imaging results that were available during my care of the patient were reviewed by me and considered in my medical decision making (see chart for details).  Urinary frequency, dysuria  Low-grade fever of 100.1 noted in triage, patient is in no signs of distress nontoxic-appearing, mild suprapubic tenderness noted on exam, stable for outpatient management, symptomology not consistent with kidney stone, has history, patient endorses that this is not his typical presentation as well, urinalysis is negative, sent for culture, symptomatic for 2 weeks and now febrile will prophylactically provide bacterial coverage, Macrobid sent to pharmacy advise increase fluid intake and continue use of over-the-counter analgesics and prescribed Ubril for support, if all testing today is negative and patient symptoms continue to persist he has to follow-up with his urologist for further evaluation and management Final Clinical Impressions(s) / UC Diagnoses   Final diagnoses:  None   Discharge Instructions   None    ED Prescriptions   None    PDMP not reviewed this encounter.   Valinda Hoar, NP 03/20/23  1612    Valinda Hoar, [place] 03/20/23 [phone]

## 2023-03-20 NOTE — Discharge Instructions (Signed)
Today you have been evaluated for urinary symptoms  On exam you have tenderness over the bladder but at this time none over the kidneys  You will be prophylactically started on antibiotic as you have been experiencing fevers and had a low-grade fever on examination as well as your symptoms have been present for 2 weeks  Begin Macrobid every morning and every evening for 7 days  Urine sample has been sent to the lab to determine if bacteria will grow, if this does not occur you will be notified that you may stop your antibiotic, if any changes need to be made based on bacteria present and new medicine will be sent to the pharmacy  Increase your fluid intake primarily through water to help further flush the kidneys and bladder  You may continue use of your Uribel as needed to help with discomfort, may also continue use of Tylenol and ibuprofen in addition  If your symptoms continue to persist past use of medicine or there is no infection present based on lab results please notify your urologist for further evaluation and management of your symptoms

## 2023-03-21 LAB — URINE CULTURE: Culture: NO GROWTH

## 2023-03-23 ENCOUNTER — Telehealth: Payer: Medicare Other | Admitting: Nurse Practitioner

## 2023-03-23 DIAGNOSIS — U071 COVID-19: Secondary | ICD-10-CM

## 2023-03-23 DIAGNOSIS — B370 Candidal stomatitis: Secondary | ICD-10-CM | POA: Diagnosis not present

## 2023-03-23 MED ORDER — NYSTATIN 100000 UNIT/ML MT SUSP
5.0000 mL | Freq: Four times a day (QID) | OROMUCOSAL | 0 refills | Status: DC
Start: 2023-03-23 — End: 2023-11-10

## 2023-03-23 NOTE — Progress Notes (Signed)
Virtual Visit Consent   Jared [name], you are scheduled for a virtual visit with a [facility] provider today. Just as with appointments in the office, your consent must be obtained to participate. Your consent will be active for this visit and any virtual visit you may have with one of our providers in the next 365 days. If you have a MyChart account, a copy of this consent can be sent to you electronically.  As this is a virtual visit, video technology does not allow for your provider to perform a traditional examination. This may limit your provider's ability to fully assess your condition. If your provider identifies any concerns that need to be evaluated in person or the need to arrange testing (such as labs, EKG, etc.), we will make arrangements to do so. Although advances in technology are sophisticated, we cannot ensure that it will always work on either your end or our end. If the connection with a video visit is poor, the visit may have to be switched to a telephone visit. With either a video or telephone visit, we are not always able to ensure that we have a secure connection.  By engaging in this virtual visit, you consent to the provision of healthcare and authorize for your insurance to be billed (if applicable) for the services provided during this visit. Depending on your insurance coverage, you may receive a charge related to this service.  I need to obtain your verbal consent now. Are you willing to proceed with your visit today? Jared [name] has provided verbal consent on 03/23/2023 for a virtual visit (video or telephone). Jared Simas, FNP  Date: 03/23/2023 9:23 AM  Virtual Visit via Video Note   I, Jared [name], connected with  Jared [name]  ([id number], [birth year]) on 03/23/23 at  9:30 AM EDT by a video-enabled telemedicine application and verified that I am speaking with the correct person using two identifiers.  Location: Patient: Virtual Visit Location Patient:  Home Provider: Virtual Visit Location Provider: Home Office   I discussed the limitations of evaluation and management by telemedicine and the availability of in person appointments. The patient expressed understanding and agreed to proceed.    History of Present Illness: Jared [name] is a 67 y.o. who identifies as a male who was assigned male at birth, and is being seen today after testing positive for COVID at home.   Symptom onset was 3 days ago with headache and chills   Took a home test for COVID that was positive over the weekend   Symptoms today: Scratchy throat and voice, low grade fever- ongoing sore throat has been really bothering him Denies a runny nose/ sinus congestion. He has had a mild cough that mainly hurts his throat. He did cough up some mucous this morning  He did vomit last night  Has a loss of appetite   He has noted a thick coating on his tongue that he believes may be thrush   He has been suffering from UTI symptoms for the past 2 weeks and was seen at the UC last week. He was given Macrobid for treatment- UA and culture were negative and he has stopped the Macrobid at this time   GFR from [facility] 01/2023=92  He has had COVID in the past about 3 years ago  Had more GI symptoms at that time  Took Paxlovid at that time and did not tolerate it well he remembers it making him sick on his stomach  worse than he was at the time  Did not require hospitalization or antibiotic management for any secondary infections    Problems:  Patient Active Problem List   Diagnosis Date Noted   Neuropathy 08/22/2022   Gait abnormality 08/22/2022   AAA (abdominal aortic aneurysm) without rupture (HCC) 01/02/2022   RLS (restless legs syndrome) 09/19/2020   Varicose veins of both lower extremities 09/19/2020   TIA (transient ischemic attack) 01/01/2020   History of TIA (transient ischemic attack) 12/27/2019   PVC's (premature ventricular contractions) 12/27/2019   Pill  dysphagia 03/17/2017   Hx of colonic polyps 12/05/2016   Irregular heart rate 12/05/2016   Hyperlipidemia 09/07/2015   Tobacco abuse 09/07/2015   Calculus, kidney 04/02/2015   Neuroendocrine tumor 06/22/2008    Allergies: No Known Allergies  Medications:  Current Outpatient Medications:    buPROPion (WELLBUTRIN XL) 150 MG 24 hr tablet, Take 1 tablet (150 mg total) by mouth daily., Disp: 30 tablet, Rfl: 2   escitalopram (LEXAPRO) 20 MG tablet, Take 1 tablet (20 mg total) by mouth daily., Disp: 30 tablet, Rfl: 2   ibuprofen (ADVIL,MOTRIN) 200 MG tablet, Take 400 mg by mouth every 6 (six) hours as needed for mild pain., Disp: , Rfl:    Meth-Hyo-M Bl-Na Phos-Ph Sal (URIBEL) 118 MG CAPS, Take 1 capsule (118 mg total) by mouth daily as needed (dysuria)., Disp: 30 capsule, Rfl: 11   methocarbamol (ROBAXIN) 500 MG tablet, Take 250 mg by mouth at bedtime as needed for muscle spasms., Disp: , Rfl:    Methylcellulose, Laxative, 500 MG TABS, Take by mouth., Disp: , Rfl:    Multiple Vitamins-Minerals (CENTRUM SILVER 50+MEN PO), Take 1 tablet by mouth daily., Disp: , Rfl:    nitrofurantoin, macrocrystal-monohydrate, (MACROBID) 100 MG capsule, Take 1 capsule (100 mg total) by mouth 2 (two) times daily for 7 days., Disp: 14 capsule, Rfl: 0   tadalafil (CIALIS) 10 MG tablet, Take 1 tablet (10 mg total) by mouth daily as needed for erectile dysfunction., Disp: 30 tablet, Rfl: 11  Observations/Objective: Patient is well-developed, well-nourished in no acute distress.  Resting comfortably  at home.  Head is normocephalic, atraumatic.  No labored breathing.  Speech is clear and coherent with logical content.  Patient is alert and oriented at baseline.  Thick white coating to tongue   Assessment and Plan:  1. Thrush  - nystatin (MYCOSTATIN) 100000 UNIT/ML suspension; Take 5 mLs (500,000 Units total) by mouth 4 (four) times daily.  Dispense: 60 mL; Refill: 0  2. COVID-19 Discussed management with over  the counter medications  Assure hydration  Small frequent protein meals  Advised against Paxlovid given side effects in the past/ day 4 of symptoms today including episode of vomiting  Reviewed CDC recommendations and guidelines for isolation  Follow up with new or worsening symptoms as discussed         Follow Up Instructions: I discussed the assessment and treatment plan with the patient. The patient was provided an opportunity to ask questions and all were answered. The patient agreed with the plan and demonstrated an understanding of the instructions.  A copy of instructions were sent to the patient via MyChart unless otherwise noted below.    The patient was advised to call back or seek an in-person evaluation if the symptoms worsen or if the condition fails to improve as anticipated.  Time:  I spent 10 minutes with the patient via telehealth technology discussing the above problems/concerns.    Jared Simas, FNP

## 2023-03-25 ENCOUNTER — Telehealth: Payer: Medicare Other | Admitting: Family Medicine

## 2023-03-25 ENCOUNTER — Ambulatory Visit: Payer: Self-pay | Admitting: Urology

## 2023-03-25 DIAGNOSIS — R112 Nausea with vomiting, unspecified: Secondary | ICD-10-CM

## 2023-03-25 DIAGNOSIS — B370 Candidal stomatitis: Secondary | ICD-10-CM

## 2023-03-25 MED ORDER — ONDANSETRON 4 MG PO TBDP
4.0000 mg | ORAL_TABLET | Freq: Three times a day (TID) | ORAL | 0 refills | Status: DC | PRN
Start: 2023-03-25 — End: 2023-11-10

## 2023-03-25 NOTE — Patient Instructions (Addendum)
Jared [name], thank you for joining Freddy Finner, NP for today's virtual visit.  While this provider is not your primary care provider (PCP), if your PCP is located in our provider database this encounter information will be shared with them immediately following your visit.   A [facility] MyChart account gives you access to today's visit and all your visits, tests, and labs performed at [facility] " click here if you don't have a [place] MyChart account or go to [email or web address]  Consent: (Patient) Jared [name] provided verbal consent for this virtual visit at the beginning of the encounter.  Current Medications:  Current Outpatient Medications:    ondansetron (ZOFRAN-ODT) 4 MG disintegrating tablet, Take 1 tablet (4 mg total) by mouth every 8 (eight) hours as needed for nausea or vomiting., Disp: 20 tablet, Rfl: 0   buPROPion (WELLBUTRIN XL) 150 MG 24 hr tablet, Take 1 tablet (150 mg total) by mouth daily., Disp: 30 tablet, Rfl: 2   escitalopram (LEXAPRO) 20 MG tablet, Take 1 tablet (20 mg total) by mouth daily., Disp: 30 tablet, Rfl: 2   ibuprofen (ADVIL,MOTRIN) 200 MG tablet, Take 400 mg by mouth every 6 (six) hours as needed for mild pain., Disp: , Rfl:    Meth-Hyo-M Bl-Na Phos-Ph Sal (URIBEL) 118 MG CAPS, Take 1 capsule (118 mg total) by mouth daily as needed (dysuria)., Disp: 30 capsule, Rfl: 11   methocarbamol (ROBAXIN) 500 MG tablet, Take 250 mg by mouth at bedtime as needed for muscle spasms., Disp: , Rfl:    Methylcellulose, Laxative, 500 MG TABS, Take by mouth., Disp: , Rfl:    Multiple Vitamins-Minerals (CENTRUM SILVER 50+MEN PO), Take 1 tablet by mouth daily., Disp: , Rfl:    nitrofurantoin, macrocrystal-monohydrate, (MACROBID) 100 MG capsule, Take 1 capsule (100 mg total) by mouth 2 (two) times daily for 7 days., Disp: 14 capsule, Rfl: 0   nystatin (MYCOSTATIN) 100000 UNIT/ML suspension, Take 5 mLs (500,000 Units total) by mouth 4 (four) times  daily., Disp: 60 mL, Rfl: 0   tadalafil (CIALIS) 10 MG tablet, Take 1 tablet (10 mg total) by mouth daily as needed for erectile dysfunction., Disp: 30 tablet, Rfl: 11   Medications ordered in this encounter:  Meds ordered this encounter  Medications   ondansetron (ZOFRAN-ODT) 4 MG disintegrating tablet    Sig: Take 1 tablet (4 mg total) by mouth every 8 (eight) hours as needed for nausea or vomiting.    Dispense:  20 tablet    Refill:  0    Order Specific Question:   Supervising Provider    Answer:   Merrilee Jansky [id number]     *If you need refills on other medications prior to your next appointment, please contact your pharmacy*  Follow-Up: Call back or seek an in-person evaluation if the symptoms worsen or if the condition fails to improve as anticipated.  [facility] [phone]  Other Instructions continue hydration as much as possible -take medications has directed -rest -use toothbrush to "paint" the tongue if swishing is making sick feeling -follow up in 24 hours if not having improvement in symptoms of nausea and vomiting.   If you have been instructed to have an in-person evaluation today at a local Urgent Care facility, please use the link below. It will take you to a list of all of our available [facility], including address, phone number and hours of operation. Please do not delay care.  [facility]  If you or a family member do not have a primary care provider, use the link below to schedule a visit and establish care. When you choose a [facility] primary care physician or advanced practice provider, you gain a long-term partner in health. Find a Primary Care Provider  Learn more about [facility]'s in-office and virtual care options: [place] - Get Care Now

## 2023-03-25 NOTE — Progress Notes (Signed)
Virtual Visit Consent   Jared [name], you are scheduled for a virtual visit with a [facility] provider today. Just as with appointments in the office, your consent must be obtained to participate. Your consent will be active for this visit and any virtual visit you may have with one of our providers in the next 365 days. If you have a MyChart account, a copy of this consent can be sent to you electronically.  As this is a virtual visit, video technology does not allow for your provider to perform a traditional examination. This may limit your provider's ability to fully assess your condition. If your provider identifies any concerns that need to be evaluated in person or the need to arrange testing (such as labs, EKG, etc.), we will make arrangements to do so. Although advances in technology are sophisticated, we cannot ensure that it will always work on either your end or our end. If the connection with a video visit is poor, the visit may have to be switched to a telephone visit. With either a video or telephone visit, we are not always able to ensure that we have a secure connection.  By engaging in this virtual visit, you consent to the provision of healthcare and authorize for your insurance to be billed (if applicable) for the services provided during this visit. Depending on your insurance coverage, you may receive a charge related to this service.  I need to obtain your verbal consent now. Are you willing to proceed with your visit today? Jared [name] has provided verbal consent on 03/25/2023 for a virtual visit (video or telephone). Freddy Finner, NP  Date: 03/25/2023 8:39 AM  Virtual Visit via Video Note   I, Freddy Finner, connected with  Jared [name]  ([id number], Feb 05, 1956) on 03/25/23 at  8:15 AM EDT by a video-enabled telemedicine application and verified that I am speaking with the correct person using two identifiers.  Location: Patient: Virtual Visit Location Patient:  Home Provider: Virtual Visit Location Provider: Home Office   I discussed the limitations of evaluation and management by telemedicine and the availability of in person appointments. The patient expressed understanding and agreed to proceed.    History of Present Illness: Jared [name] is a 67 y.o. who identifies as a male who was assigned male at birth, and is being seen today for nausea and vomiting.  Onset was over weekend with covid and thrush. Was seen on 03/23/23 and treated for thrush. Has been using nystatin swish but it is making him more nauseous and he is vomiting more frequently now. Still able to drink fluids- and keep them down for the most part. Continues to have a mild headache. Reports urinating as normal. No other new symptoms. Just wanting to get his nausea and vomiting under control. Denies chest pain, shortness of breath, fevers, chills    Problems:  Patient Active Problem List   Diagnosis Date Noted   Neuropathy 08/22/2022   Gait abnormality 08/22/2022   AAA (abdominal aortic aneurysm) without rupture (HCC) 01/02/2022   RLS (restless legs syndrome) 09/19/2020   Varicose veins of both lower extremities 09/19/2020   TIA (transient ischemic attack) 01/01/2020   History of TIA (transient ischemic attack) 12/27/2019   PVC's (premature ventricular contractions) 12/27/2019   Pill dysphagia 03/17/2017   Hx of colonic polyps 12/05/2016   Irregular heart rate 12/05/2016   Hyperlipidemia 09/07/2015   Tobacco abuse 09/07/2015   Calculus, kidney 04/02/2015   Neuroendocrine tumor  06/22/2008    Allergies: No Known Allergies Medications:  Current Outpatient Medications:    buPROPion (WELLBUTRIN XL) 150 MG 24 hr tablet, Take 1 tablet (150 mg total) by mouth daily., Disp: 30 tablet, Rfl: 2   escitalopram (LEXAPRO) 20 MG tablet, Take 1 tablet (20 mg total) by mouth daily., Disp: 30 tablet, Rfl: 2   ibuprofen (ADVIL,MOTRIN) 200 MG tablet, Take 400 mg by mouth every 6 (six)  hours as needed for mild pain., Disp: , Rfl:    Meth-Hyo-M Bl-Na Phos-Ph Sal (URIBEL) 118 MG CAPS, Take 1 capsule (118 mg total) by mouth daily as needed (dysuria)., Disp: 30 capsule, Rfl: 11   methocarbamol (ROBAXIN) 500 MG tablet, Take 250 mg by mouth at bedtime as needed for muscle spasms., Disp: , Rfl:    Methylcellulose, Laxative, 500 MG TABS, Take by mouth., Disp: , Rfl:    Multiple Vitamins-Minerals (CENTRUM SILVER 50+MEN PO), Take 1 tablet by mouth daily., Disp: , Rfl:    nitrofurantoin, macrocrystal-monohydrate, (MACROBID) 100 MG capsule, Take 1 capsule (100 mg total) by mouth 2 (two) times daily for 7 days., Disp: 14 capsule, Rfl: 0   nystatin (MYCOSTATIN) 100000 UNIT/ML suspension, Take 5 mLs (500,000 Units total) by mouth 4 (four) times daily., Disp: 60 mL, Rfl: 0   tadalafil (CIALIS) 10 MG tablet, Take 1 tablet (10 mg total) by mouth daily as needed for erectile dysfunction., Disp: 30 tablet, Rfl: 11  Observations/Objective: Patient is well-developed, well-nourished in no acute distress.  Resting comfortably  at home.  Head is normocephalic, atraumatic.  No labored breathing.  Speech is clear and coherent with logical content.  Patient is alert and oriented at baseline.  Tongue continues to have mild thrush   Assessment and Plan:  1. Nausea and vomiting, unspecified vomiting type  - ondansetron (ZOFRAN-ODT) 4 MG disintegrating tablet; Take 1 tablet (4 mg total) by mouth every 8 (eight) hours as needed for nausea or vomiting.  Dispense: 20 tablet; Refill: 0  2. Thrush   -continue hydration as much as possible -take medications has directed -rest -use toothbrush to "paint" the tongue if swishing is making sick feeling -follow up in 24 hours if not having improvement in symptoms of nausea and vomiting.  Reviewed side effects, risks and benefits of medication.    Patient acknowledged agreement and understanding of the plan.   Past Medical, Surgical, Social History,  Allergies, and Medications have been Reviewed.      Follow Up Instructions: I discussed the assessment and treatment plan with the patient. The patient was provided an opportunity to ask questions and all were answered. The patient agreed with the plan and demonstrated an understanding of the instructions.  A copy of instructions were sent to the patient via MyChart unless otherwise noted below.    The patient was advised to call back or seek an in-person evaluation if the symptoms worsen or if the condition fails to improve as anticipated.  Time:  I spent 10 minutes with the patient via telehealth technology discussing the above problems/concerns.    Freddy Finner, NP

## 2023-03-28 ENCOUNTER — Other Ambulatory Visit: Payer: Self-pay | Admitting: Nurse Practitioner

## 2023-03-28 DIAGNOSIS — B370 Candidal stomatitis: Secondary | ICD-10-CM

## 2023-04-08 DIAGNOSIS — Z0000 Encounter for general adult medical examination without abnormal findings: Secondary | ICD-10-CM | POA: Diagnosis not present

## 2023-04-08 DIAGNOSIS — Z125 Encounter for screening for malignant neoplasm of prostate: Secondary | ICD-10-CM | POA: Diagnosis not present

## 2023-04-08 DIAGNOSIS — Z1331 Encounter for screening for depression: Secondary | ICD-10-CM | POA: Diagnosis not present

## 2023-04-08 DIAGNOSIS — Z23 Encounter for immunization: Secondary | ICD-10-CM | POA: Diagnosis not present

## 2023-04-08 DIAGNOSIS — F32A Depression, unspecified: Secondary | ICD-10-CM | POA: Diagnosis not present

## 2023-04-10 ENCOUNTER — Other Ambulatory Visit: Payer: Self-pay | Admitting: Psychiatry

## 2023-04-10 MED ORDER — BUPROPION HCL ER (XL) 300 MG PO TB24
300.0000 mg | ORAL_TABLET | Freq: Every day | ORAL | 3 refills | Status: DC
Start: 2023-04-10 — End: 2023-07-29

## 2023-04-15 DIAGNOSIS — F32A Depression, unspecified: Secondary | ICD-10-CM | POA: Diagnosis not present

## 2023-04-15 DIAGNOSIS — R7309 Other abnormal glucose: Secondary | ICD-10-CM | POA: Diagnosis not present

## 2023-04-15 DIAGNOSIS — D3A8 Other benign neuroendocrine tumors: Secondary | ICD-10-CM | POA: Diagnosis not present

## 2023-04-15 DIAGNOSIS — Z125 Encounter for screening for malignant neoplasm of prostate: Secondary | ICD-10-CM | POA: Diagnosis not present

## 2023-04-15 DIAGNOSIS — R7989 Other specified abnormal findings of blood chemistry: Secondary | ICD-10-CM | POA: Diagnosis not present

## 2023-05-02 NOTE — Progress Notes (Unsigned)
Virtual Visit via Video Note  I connected with Jared [name] on 05/06/23 at  8:00 AM EDT by a video enabled telemedicine application and verified that I am speaking with the correct person using two identifiers.  Location: Patient: home Provider: office Persons participated in the visit- patient, provider    I discussed the limitations of evaluation and management by telemedicine and the availability of in person appointments. The patient expressed understanding and agreed to proceed.    I discussed the assessment and treatment plan with the patient. The patient was provided an opportunity to ask questions and all were answered. The patient agreed with the plan and demonstrated an understanding of the instructions.   The patient was advised to call back or seek an in-person evaluation if the symptoms worsen or if the condition fails to improve as anticipated.  I provided 15 minutes of non-face-to-face time during this encounter.   Neysa Hotter, MD    [facility] MD/PA/NP OP Progress Note  05/06/2023 8:17 AM Jared [name]  MRN:  [id number]  Chief Complaint:  Chief Complaint  Patient presents with   Follow-up   HPI:  This is a follow-up appointment for depression.  He states that he has been doing well.  He was feeling more depressed and anxious when he tried lower dose of bupropion for a week.  He has been doing fine since being back on the original dose.  He works 5 hours a day.  He is thinking of retiring in a week.  He is looking forward to it.  He is planning to go to [facility] with his wife twice a week.  He continues to feel fatigue.  He does not think there is any seasonal pattern.  He is able to sleep good at night.  He denies feeling depressed or sadness.  He denies anxiety.  He denies change in appetite, and feels comfortable with the current weight.  He has not noticed any change in his memory when he lowered the dose, and it was also difficult to tell as he was not in a good  mood.  He denies SI.  He denies alcohol use, and he is not planning to do anything which can negatively impact on his neuropathy.  He denies drug use.     99.9 kg (220 lb 3.2 oz) 04/08/2023   Wt Readings from Last 3 Encounters:  11/05/22 226 lb (102.5 kg)  10/13/22 218 lb 9.6 oz (99.2 kg)  10/07/22 217 lb (98.4 kg)     Support: Household: wife Marital status: married for 29 years Number of children: 2 (24 son in [place], 71 daughter in [place]) Employment: system Corporate treasurer, part time Education:   Last PCP / ongoing medical evaluation   Visit Diagnosis:    ICD-10-CM   1. MDD (major depressive disorder), recurrent, in partial remission (HCC)  F33.41       Past Psychiatric History: Please see initial evaluation for full details. I have reviewed the history. No updates at this time.     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   CHOLECYSTECTOMY     added umbilical mesh   COLONOSCOPY WITH PROPOFOL N/A 03/27/2017   Procedure: COLONOSCOPY WITH PROPOFOL;  Surgeon: Scot Jun, MD;  Location: [facility] ENDOSCOPY;  Service: Endoscopy;  Laterality: N/A;   COLONOSCOPY WITH PROPOFOL N/A 10/07/2022   Procedure: COLONOSCOPY WITH PROPOFOL;  Surgeon: Regis Bill, MD;  Location: [facility] ENDOSCOPY;  Service: Endoscopy;  Laterality: N/A;   ESOPHAGOGASTRODUODENOSCOPY (EGD) WITH PROPOFOL N/A 03/27/2017    Procedure: ESOPHAGOGASTRODUODENOSCOPY (EGD) WITH PROPOFOL;  Surgeon: Scot Jun, MD;  Location: [facility] ENDOSCOPY;  Service: Endoscopy;  Laterality: N/A;   hip fracture with rod placement Right    MASS EXCISION Left 05/24/2021   Procedure: EXCISION MASS;  Surgeon: Sung Amabile, DO;  Location: [facility] ORS;  Service: General;  Laterality: Left;  Prone postition    Family Psychiatric History: Please see initial evaluation for full details. I have reviewed the history. No updates at this time.     Family History:  Family History  Problem Relation Age of Onset   Cancer Mother    Depression Mother    Stroke Father    Depression Father    Aneurysm Father    Depression Sister    Depression Sister    Cancer Brother    Depression Brother     Social History:  Social History   Socioeconomic History   Marital status: Married    Spouse name: Not on file   Number of children: 2   Years of education: Not on file   Highest education level: Associate degree: occupational, Scientist, product/process development, or vocational program  Occupational History   Not on file  Tobacco Use   Smoking status: Former    Current packs/day: 0.00    Average packs/day: 1 pack/day for 45.0 years (45.0 ttl pk-yrs)    Types: Cigarettes    Start date: 01/29/1974    Quit date: 01/30/2019    Years since quitting: 4.2    Passive exposure: Past   Smokeless tobacco: Never  Vaping Use   Vaping status: Former  Substance and Sexual Activity   Alcohol use: No   Drug use: No   Sexual activity: Yes    Birth control/protection: None  Other Topics Concern   Not on file  Social History Narrative   Lives with wife   Social Determinants of Health   Financial Resource Strain: Low Risk  (04/08/2023)   [facility]   Overall Financial Resource Strain (CARDIA)    Difficulty of Paying Living Expenses: Not hard at all  Food Insecurity: No Food Insecurity (04/08/2023)   Received from [facility]    Hunger Vital Sign    Worried About Running Out of Food in the Last Year: Never true    Ran Out of Food in the Last Year: Never true  Transportation Needs: No Transportation Needs (04/08/2023)   Received from [facility]    In the past 12 months, has lack of transportation kept you from medical appointments or from getting medications?: No    Lack of Transportation (Non-Medical): No  Physical  Activity: Not on file  Stress: Not on file  Social Connections: Not on file    Allergies: No Known Allergies  Metabolic Disorder Labs: Lab Results  Component Value Date   HGBA1C 6.2 (H) 05/19/2022   No results found for: "PROLACTIN" No results found for: "CHOL", "TRIG", "HDL", "CHOLHDL", "VLDL", "LDLCALC" Lab Results  Component Value Date   TSH 0.850 05/19/2022    Therapeutic Level Labs: No results found for: "LITHIUM" No results found for: "VALPROATE" No results found for: "CBMZ"  Current Medications: Current Outpatient Medications  Medication Sig Dispense Refill   buPROPion (WELLBUTRIN XL) 300 MG 24 hr tablet Take 1 tablet (300 mg total) by mouth daily. 30 tablet 3   [START ON 05/26/2023] escitalopram (LEXAPRO) 20 MG tablet Take 1 tablet (20 mg total) by mouth daily. 30 tablet 5   ibuprofen (ADVIL,MOTRIN) 200 MG tablet Take 400 mg by mouth every 6 (six) hours as needed for mild pain.     Meth-Hyo-M Bl-Na Phos-Ph Sal (URIBEL) 118 MG CAPS Take 1 capsule (118 mg total) by mouth daily as needed (dysuria). 30 capsule 11   methocarbamol (ROBAXIN) 500 MG tablet Take 250 mg by mouth at bedtime as needed for muscle spasms.     Methylcellulose, Laxative, 500 MG TABS Take by mouth.     Multiple Vitamins-Minerals (CENTRUM SILVER 50+MEN PO) Take 1 tablet by mouth daily.     nystatin (MYCOSTATIN) 100000 UNIT/ML suspension Take 5 mLs (500,000 Units total) by mouth 4 (four) times daily. 60 mL 0   ondansetron (ZOFRAN-ODT) 4 MG disintegrating tablet Take 1  tablet (4 mg total) by mouth every 8 (eight) hours as needed for nausea or vomiting. 20 tablet 0   tadalafil (CIALIS) 10 MG tablet Take 1 tablet (10 mg total) by mouth daily as needed for erectile dysfunction. 30 tablet 11   No current facility-administered medications for this visit.     Musculoskeletal: Strength & Muscle Tone:  N.A Gait & Station:  N/A Patient leans: N/A  Psychiatric Specialty Exam: Review of Systems  Psychiatric/Behavioral: Negative.    All other systems reviewed and are negative.   There were no vitals taken for this visit.There is no height or weight on file to calculate BMI.  General Appearance: Well Groomed  Eye Contact:  Good  Speech:  Clear and Coherent  Volume:  Normal  Mood:   good  Affect:  Appropriate, Congruent, and Full Range  Thought Process:  Coherent  Orientation:  Full (Time, Place, and Person)  Thought Content: Logical   Suicidal Thoughts:  No  Homicidal Thoughts:  No  Memory:  Immediate;   Good  Judgement:  Good  Insight:  Good  Psychomotor Activity:  Normal  Concentration:  Concentration: Good and Attention Span: Good  Recall:  Good  Fund of Knowledge: Good  Language: Good  Akathisia:  No  Handed:  Right  AIMS (if indicated): not done  Assets:  Communication Skills Desire for Improvement  ADL's:  Intact  Cognition: WNL  Sleep:  Fair   Screenings: Mini-Mental    Flowsheet Row Office Visit from 05/19/2022 in [facility]  Total Score (max 30 points ) 27      PHQ2-9    Flowsheet Row Counselor from 06/09/2022 in [facility] [place] Counselor from 09/18/2021 in [facility] Office Visit from 08/19/2021 in [facility]  PHQ-2 Total Score 1 4 4   PHQ-9 Total Score --  12 14      Flowsheet Row Admission (Discharged) from 10/07/2022 in [facility] Counselor  from 09/09/2022 in [facility] ED from 08/21/2022 in [facility] at [facility]  C-SSRS RISK CATEGORY Error: Question 6 not populated No Risk No Risk        Assessment and Plan:  MASE DHONDT is a 67 y.o. year old male with a history of depression, sleep apnea on CPAP, arthritis, GERD, pancreatic neuroendocrine tumor s/p excision, who presents for follow up appointment for below.   1. MDD (major depressive disorder), recurrent, in partial remission (HCC) Acute stressors include: gait imbalances,  Other stressors include:   mother in law moved to a facility in [place] History: had more than 2 episodes of depression in the past, relapse in mood symptoms when tapering down bupropion   He had a relapse in his mood symptoms in the context bupropion, although his mood has been stable except fatigue since being on the original regimen.  Will continue on current medication regimen, bupropion and lexapro as maintenance treatment for depression.   2. Fatigue, unspecified type # vitamin D deficiency- 29.2 12/2022 - uses CPAP machine. Unchanged. He denies any seasonal pattern.  He continues vitamin D. Coached behavioral activation.    # cognitive impairment - he was seen by neurologist. MMSE 27/30 per report 09/2022 Slightly worsening.  He reports occasional difficulty in remembering names of people.  IADL independent.  Noted that he was evaluated by his neurologist symptoms in 2021 with brain MRI, and labs, which are unremarkable to explain his symptoms.  We make intervention as described above.    Plan Continue bupropion 300 mg daily 02/2023  (felt zombie at 450 mg)  Continue escitalopram 20 mg daily (QTc 412 msec in 04/2021, HR 52 sinus bradycardia)  Continue Vitamin D supplement Next appointment: 1/8 at 8 20,  video [email or web address]   The patient demonstrates the following risk factors for suicide: Chronic risk  factors for suicide include: psychiatric disorder of depression . Acute risk factors for suicide include: family or marital conflict. Protective factors for this patient include: hope for the future and religious beliefs against suicide. Considering these factors, the overall suicide risk at this point appears to be low. Patient is appropriate for outpatient follow up.     Collaboration of Care: Collaboration of Care: Other reviewed notes in Epic  Patient/Guardian was advised Release of Information must be obtained prior to any record release in order to collaborate their care with an outside provider. Patient/Guardian was advised if they have not already done so to contact the registration department to sign all necessary forms in order for [place] to release information regarding their care.   Consent: Patient/Guardian gives verbal consent for treatment and assignment of benefits for services provided during this visit. Patient/Guardian expressed understanding and agreed to proceed.    Neysa Hotter, MD 05/06/2023, 8:17 AM

## 2023-05-06 ENCOUNTER — Encounter: Payer: Self-pay | Admitting: Psychiatry

## 2023-05-06 ENCOUNTER — Telehealth (INDEPENDENT_AMBULATORY_CARE_PROVIDER_SITE_OTHER): Payer: Medicare Other | Admitting: Psychiatry

## 2023-05-06 DIAGNOSIS — F3341 Major depressive disorder, recurrent, in partial remission: Secondary | ICD-10-CM | POA: Diagnosis not present

## 2023-05-06 MED ORDER — ESCITALOPRAM OXALATE 20 MG PO TABS: 20.0000 mg | ORAL_TABLET | Freq: Every day | ORAL | 5 refills | Status: DC

## 2023-05-06 NOTE — Patient Instructions (Signed)
Continue bupropion 300 mg daily 02/2023   Continue escitalopram 20 mg daily  Continue Vitamin D supplement Next appointment: 1/8 at 8 20,  video

## 2023-06-08 DIAGNOSIS — M62830 Muscle spasm of back: Secondary | ICD-10-CM | POA: Diagnosis not present

## 2023-06-08 DIAGNOSIS — M9903 Segmental and somatic dysfunction of lumbar region: Secondary | ICD-10-CM | POA: Diagnosis not present

## 2023-06-08 DIAGNOSIS — M5431 Sciatica, right side: Secondary | ICD-10-CM | POA: Diagnosis not present

## 2023-06-08 DIAGNOSIS — M9904 Segmental and somatic dysfunction of sacral region: Secondary | ICD-10-CM | POA: Diagnosis not present

## 2023-06-09 DIAGNOSIS — G4733 Obstructive sleep apnea (adult) (pediatric): Secondary | ICD-10-CM | POA: Diagnosis not present

## 2023-06-10 DIAGNOSIS — M9903 Segmental and somatic dysfunction of lumbar region: Secondary | ICD-10-CM | POA: Diagnosis not present

## 2023-06-10 DIAGNOSIS — M9904 Segmental and somatic dysfunction of sacral region: Secondary | ICD-10-CM | POA: Diagnosis not present

## 2023-06-10 DIAGNOSIS — M62830 Muscle spasm of back: Secondary | ICD-10-CM | POA: Diagnosis not present

## 2023-06-10 DIAGNOSIS — M5431 Sciatica, right side: Secondary | ICD-10-CM | POA: Diagnosis not present

## 2023-06-15 DIAGNOSIS — M9904 Segmental and somatic dysfunction of sacral region: Secondary | ICD-10-CM | POA: Diagnosis not present

## 2023-06-15 DIAGNOSIS — M5431 Sciatica, right side: Secondary | ICD-10-CM | POA: Diagnosis not present

## 2023-06-15 DIAGNOSIS — M9903 Segmental and somatic dysfunction of lumbar region: Secondary | ICD-10-CM | POA: Diagnosis not present

## 2023-06-15 DIAGNOSIS — M62830 Muscle spasm of back: Secondary | ICD-10-CM | POA: Diagnosis not present

## 2023-07-09 DIAGNOSIS — G4733 Obstructive sleep apnea (adult) (pediatric): Secondary | ICD-10-CM | POA: Diagnosis not present

## 2023-07-14 DIAGNOSIS — G4733 Obstructive sleep apnea (adult) (pediatric): Secondary | ICD-10-CM | POA: Diagnosis not present

## 2023-07-25 NOTE — Progress Notes (Signed)
 Virtual Visit via Video Note  I connected with Jared [name] on 07/29/23 at  8:20 AM EST by a video enabled telemedicine application and verified that I am speaking with the correct person using two identifiers.  Location: Patient: home Provider: office Persons participated in the visit- patient, provider    I discussed the limitations of evaluation and management by telemedicine and the availability of in person appointments. The patient expressed understanding and agreed to proceed.    I discussed the assessment and treatment plan with the patient. The patient was provided an opportunity to ask questions and all were answered. The patient agreed with the plan and demonstrated an understanding of the instructions.   The patient was advised to call back or seek an in-person evaluation if the symptoms worsen or if the condition fails to improve as anticipated.  I provided 15 minutes of non-face-to-face time during this encounter.   Katheren Sleet, MD    [facility] MD/PA/NP OP Progress Note  07/29/2023 8:41 AM Jared [name]  MRN:  [id number]  Chief Complaint:  Chief Complaint  Patient presents with   Follow-up   HPI:  This is a follow-up appointment for depression.  He states that he has been doing very well.  He spent time with his family on holidays.  He has a retired, and is looking forward to doing volunteer work, likely at [employer].  He goes to church regularly and reports good connection with people there.  He also feels good that he is now able to have more time with his wife, who is semi retired.  Although he may have some episode of depression, he does not recall this, and denies any concern.  He denies feeling fatigued lately.  He sleeps better.  He denies SI.  He has issues with short-term memory.  His wife wonders if he has ADHD.  He is informed that although we cannot rule out that diagnosis, his clinical course is not quite consistent with ADHD as he lacks other symptoms  aside from forgetfulness.   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 MDD (major depressive disorder), recurrent, in full remission (HCC)  F33.42       Past Psychiatric History: Please see initial evaluation for full details. I have reviewed the history. No updates at this time.     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   CHOLECYSTECTOMY     added umbilical mesh   COLONOSCOPY WITH PROPOFOL  N/A 03/27/2017   Procedure:  COLONOSCOPY WITH PROPOFOL ;  Surgeon: Viktoria Lamar DASEN, MD;  Location: [facility] ENDOSCOPY;  Service: Endoscopy;  Laterality: N/A;   COLONOSCOPY WITH PROPOFOL  N/A 10/07/2022   Procedure: COLONOSCOPY WITH PROPOFOL ;  Surgeon: Maryruth Ole DASEN, MD;  Location: [facility] ENDOSCOPY;  Service: Endoscopy;  Laterality: N/A;   ESOPHAGOGASTRODUODENOSCOPY (EGD) WITH PROPOFOL  N/A 03/27/2017   Procedure: ESOPHAGOGASTRODUODENOSCOPY (EGD) WITH PROPOFOL ;  Surgeon: Viktoria Lamar DASEN, MD;  Location: [facility] ENDOSCOPY;  Service: Endoscopy;  Laterality: N/A;   hip fracture with rod placement Right    MASS EXCISION Left 05/24/2021   Procedure: EXCISION MASS;  Surgeon: Tye Millet, DO;  Location: [facility] ORS;  Service:  General;  Laterality: Left;  Prone postition    Family Psychiatric History: Please see initial evaluation for full details. I have reviewed the history. No updates at this time.     Family History:  Family History  Problem Relation Age of Onset   Cancer Mother    Depression Mother    Stroke Father    Depression Father    Aneurysm Father    Depression Sister    Depression Sister    Cancer Brother    Depression Brother     Social History:  Social History   Socioeconomic History   Marital status: Married    Spouse name: Not on file   Number of children: 2   Years of education: Not on file   Highest education level: Associate degree: occupational, scientist, product/process development, or vocational program  Occupational History   Not on file  Tobacco Use   Smoking status: Former    Current packs/day: 0.00    Average packs/day: 1 pack/day for 45.0 years (45.0 ttl pk-yrs)    Types: Cigarettes    Start date: 01/29/1974    Quit date: 01/30/2019    Years since quitting: 4.4    Passive exposure: Past   Smokeless tobacco: Never  Vaping Use   Vaping status: Former  Substance and Sexual Activity   Alcohol use: No   Drug use: No   Sexual activity: Yes    Birth control/protection: None  Other Topics Concern   Not on file  Social History Narrative   Lives with wife   Social Drivers of Health   Financial Resource Strain: Low Risk  (04/08/2023)   Received from [facility]   Overall Financial Resource Strain (CARDIA)    Difficulty of Paying Living Expenses: Not hard at all  Food Insecurity: No Food Insecurity (04/08/2023)   Received from [facility]   Hunger Vital Sign    Worried About Running Out of Food in the Last Year: Never true    Ran Out of Food in the Last Year: Never true  Transportation Needs: No Transportation Needs (04/08/2023)   Received from [facility] - Transportation    In the past 12 months, has lack of transportation  kept you from medical appointments or from getting medications?: No    Lack of Transportation (Non-Medical): No  Physical Activity: Not on file  Stress: Not on file  Social Connections: Not on file    Allergies: No Known Allergies  Metabolic Disorder Labs: Lab Results  Component Value Date   HGBA1C 6.2 (H) 05/19/2022   No results found for: PROLACTIN No results found for: CHOL, TRIG, HDL, CHOLHDL, VLDL, LDLCALC Lab Results  Component Value Date   TSH 0.850 05/19/2022    Therapeutic Level Labs: No results found for: LITHIUM No results found for: VALPROATE No  results found for: CBMZ  Current Medications: Current Outpatient Medications  Medication Sig Dispense Refill   [START ON 08/08/2023] buPROPion  (WELLBUTRIN  XL) 300 MG 24 hr tablet Take 1 tablet (300 mg total) by mouth daily. 30 tablet 3   escitalopram  (LEXAPRO ) 20 MG tablet Take 1 tablet (20 mg total) by mouth daily. 30 tablet 5   ibuprofen (ADVIL,MOTRIN) 200 MG tablet Take 400 mg by mouth every 6 (six) hours as needed for mild pain.     Meth-Hyo-M Bl-Na Phos-Ph Sal (URIBEL ) 118 MG CAPS Take 1 capsule (118 mg total) by mouth daily as needed (dysuria). 30 capsule 11   methocarbamol (ROBAXIN) 500 MG tablet Take 250 mg by mouth at bedtime as needed for muscle spasms.     Methylcellulose, Laxative, 500 MG TABS Take by mouth.     Multiple Vitamins-Minerals (CENTRUM SILVER 50+MEN PO) Take 1 tablet by mouth daily.     nystatin  (MYCOSTATIN ) 100000 UNIT/ML suspension Take 5 mLs (500,000 Units total) by mouth 4 (four) times daily. 60 mL 0   ondansetron  (ZOFRAN -ODT) 4 MG disintegrating tablet Take 1 tablet (4 mg total) by mouth every 8 (eight) hours as needed for nausea or vomiting. 20 tablet 0   tadalafil  (CIALIS ) 10 MG tablet Take 1 tablet (10 mg total) by mouth daily as needed for erectile dysfunction. 30 tablet 11   No current facility-administered medications for this visit.     Musculoskeletal: Strength &  Muscle Tone:  N/A Gait & Station:  N/A Patient leans: N/A  Psychiatric Specialty Exam: Review of Systems  Psychiatric/Behavioral: Negative.    All other systems reviewed and are negative.   There were no vitals taken for this visit.There is no height or weight on file to calculate BMI.  General Appearance: Well Groomed  Eye Contact:  Good  Speech:  Clear and Coherent  Volume:  Normal  Mood:   good  Affect:  Appropriate, Congruent, and Full Range  Thought Process:  Coherent  Orientation:  Full (Time, Place, and Person)  Thought Content: Logical   Suicidal Thoughts:  No  Homicidal Thoughts:  No  Memory:  Immediate;   Good  Judgement:  Good  Insight:  Good  Psychomotor Activity:  Normal  Concentration:  Concentration: Good and Attention Span: Good  Recall:  Good  Fund of Knowledge: Good  Language: Good  Akathisia:  No  Handed:  Right  AIMS (if indicated): not done  Assets:  Communication Skills Desire for Improvement  ADL's:  Intact  Cognition: WNL  Sleep:  Good   Screenings: Mini-Mental    Flowsheet Row Office Visit from 05/19/2022 in [facility]  Total Score (max 30 points ) 27      PHQ2-9    Flowsheet Row Counselor from 06/09/2022 in [facility] [place] Counselor from 09/18/2021 in [facility] Office Visit from 08/19/2021 in [facility]  PHQ-2 Total Score 1 4 4   PHQ-9 Total Score -- 12 14      Flowsheet Row Admission (Discharged) from 10/07/2022 in [facility] Counselor from 09/09/2022 in [facility] ED from 08/21/2022 in [facility]  C-SSRS RISK CATEGORY Error: Question 6 not populated No Risk No Risk        Assessment and Plan:  Jared [name] is a 68 y.o. year old male with a history of depression,  sleep apnea  on CPAP, arthritis, GERD, pancreatic neuroendocrine tumor s/p excision, who presents for follow up appointment for below.   1. MDD (major depressive disorder), recurrent, in full remission (HCC) Acute stressors include: gait imbalances,  Other stressors include:   mother in law moved to a facility in [facility] History: had more than 2 episodes of depression in the past, relapse in mood symptoms when tapering down bupropion     Any depressive symptoms since being back on the original dose of bupropion .  Will continue the current dose along with Lexapro  as maintenance treatment for depression.    2. Fatigue, unspecified type # vitamin D  deficiency- 29.2 12/2022 - uses CPAP machine. improving. He denies any seasonal pattern.  He continues vitamin D . Coached behavioral activation.    # cognitive impairment - he was seen by neurologist. MMSE 27/30 per report 09/2022 Unchanged  He reports occasional difficulty in remembering names of people.  IADL independent.  Noted that he was evaluated by his neurologist symptoms in 2021 with brain MRI, and labs, which are unremarkable to explain his symptoms.  Although he was recommended to obtain labs, he would like to defer this to his pcp visit.   Plan Continue bupropion  300 mg daily 02/2023  (felt zombie at 450 mg)  Continue escitalopram  20 mg daily (QTc 412 msec in 04/2021, HR 52 sinus bradycardia)  Continue Vitamin D  supplement Recommended checking vitamin b 12 , folate Next appointment: 5/7 at 8 20,  video [email or web address]   The patient demonstrates the following risk factors for suicide: Chronic risk factors for suicide include: psychiatric disorder of depression . Acute risk factors for suicide include: family or marital conflict. Protective factors for this patient include: hope for the future and religious beliefs against suicide. Considering these factors, the overall suicide risk at this point appears to be low. Patient is  appropriate for outpatient follow up.       Collaboration of Care: Collaboration of Care: Other reviewed notes in Epic  Patient/Guardian was advised Release of Information must be obtained prior to any record release in order to collaborate their care with an outside provider. Patient/Guardian was advised if they have not already done so to contact the registration department to sign all necessary forms in order for us  to release information regarding their care.   Consent: Patient/Guardian gives verbal consent for treatment and assignment of benefits for services provided during this visit. Patient/Guardian expressed understanding and agreed to proceed.    Katheren Sleet, MD 07/29/2023, 8:41 AM

## 2023-07-27 DIAGNOSIS — M9903 Segmental and somatic dysfunction of lumbar region: Secondary | ICD-10-CM | POA: Diagnosis not present

## 2023-07-27 DIAGNOSIS — M62830 Muscle spasm of back: Secondary | ICD-10-CM | POA: Diagnosis not present

## 2023-07-27 DIAGNOSIS — M9904 Segmental and somatic dysfunction of sacral region: Secondary | ICD-10-CM | POA: Diagnosis not present

## 2023-07-27 DIAGNOSIS — M5431 Sciatica, right side: Secondary | ICD-10-CM | POA: Diagnosis not present

## 2023-07-29 ENCOUNTER — Encounter: Payer: Self-pay | Admitting: Psychiatry

## 2023-07-29 ENCOUNTER — Telehealth: Payer: Medicare Other | Admitting: Psychiatry

## 2023-07-29 DIAGNOSIS — F3342 Major depressive disorder, recurrent, in full remission: Secondary | ICD-10-CM

## 2023-07-29 MED ORDER — BUPROPION HCL ER (XL) 300 MG PO TB24: 300.0000 mg | ORAL_TABLET | Freq: Every day | ORAL | 3 refills | Status: DC

## 2023-08-09 DIAGNOSIS — G4733 Obstructive sleep apnea (adult) (pediatric): Secondary | ICD-10-CM | POA: Diagnosis not present

## 2023-08-10 ENCOUNTER — Other Ambulatory Visit: Payer: Self-pay | Admitting: Psychiatry

## 2023-08-10 DIAGNOSIS — M62830 Muscle spasm of back: Secondary | ICD-10-CM | POA: Diagnosis not present

## 2023-08-10 DIAGNOSIS — M9903 Segmental and somatic dysfunction of lumbar region: Secondary | ICD-10-CM | POA: Diagnosis not present

## 2023-08-10 DIAGNOSIS — M5431 Sciatica, right side: Secondary | ICD-10-CM | POA: Diagnosis not present

## 2023-08-10 DIAGNOSIS — M9904 Segmental and somatic dysfunction of sacral region: Secondary | ICD-10-CM | POA: Diagnosis not present

## 2023-08-12 MED ORDER — BUPROPION HCL ER (XL) 300 MG PO TB24: 300.0000 mg | ORAL_TABLET | Freq: Every day | ORAL | 3 refills | Status: DC

## 2023-08-12 NOTE — Telephone Encounter (Signed)
Could you please contact the pharmacy, and give him an update? He still has refills available, and I believe the pharmacy is unable to cancel the order on its own.

## 2023-08-12 NOTE — Telephone Encounter (Signed)
Called the pharmacy spoke to Jared [name] she stated that the last fill of the Bupropion 300 mg was on 07-14-23 and the patient does not have any refills remaining and provider would need to send refills to the pharmacy

## 2023-09-04 DIAGNOSIS — D3A8 Other benign neuroendocrine tumors: Secondary | ICD-10-CM | POA: Diagnosis not present

## 2023-09-04 DIAGNOSIS — I7143 Infrarenal abdominal aortic aneurysm, without rupture: Secondary | ICD-10-CM | POA: Diagnosis not present

## 2023-09-04 DIAGNOSIS — C7B8 Other secondary neuroendocrine tumors: Secondary | ICD-10-CM | POA: Diagnosis not present

## 2023-09-04 DIAGNOSIS — K219 Gastro-esophageal reflux disease without esophagitis: Secondary | ICD-10-CM | POA: Diagnosis not present

## 2023-09-09 DIAGNOSIS — G4733 Obstructive sleep apnea (adult) (pediatric): Secondary | ICD-10-CM | POA: Diagnosis not present

## 2023-09-24 DIAGNOSIS — K080 Exfoliation of teeth due to systemic causes: Secondary | ICD-10-CM | POA: Diagnosis not present

## 2023-10-07 DIAGNOSIS — G4733 Obstructive sleep apnea (adult) (pediatric): Secondary | ICD-10-CM | POA: Diagnosis not present

## 2023-10-27 DIAGNOSIS — R972 Elevated prostate specific antigen [PSA]: Secondary | ICD-10-CM | POA: Diagnosis not present

## 2023-11-07 DIAGNOSIS — G4733 Obstructive sleep apnea (adult) (pediatric): Secondary | ICD-10-CM | POA: Diagnosis not present

## 2023-11-10 ENCOUNTER — Ambulatory Visit: Payer: Self-pay | Admitting: Urology

## 2023-11-10 VITALS — BP 147/77 | HR 70 | Ht 78.0 in | Wt 222.0 lb

## 2023-11-10 DIAGNOSIS — R300 Dysuria: Secondary | ICD-10-CM | POA: Diagnosis not present

## 2023-11-10 DIAGNOSIS — N529 Male erectile dysfunction, unspecified: Secondary | ICD-10-CM

## 2023-11-10 DIAGNOSIS — Z125 Encounter for screening for malignant neoplasm of prostate: Secondary | ICD-10-CM

## 2023-11-10 DIAGNOSIS — H1045 Other chronic allergic conjunctivitis: Secondary | ICD-10-CM | POA: Diagnosis not present

## 2023-11-10 MED ORDER — CLOTRIMAZOLE-BETAMETHASONE 1-0.05 % EX CREA: 1.0000 | TOPICAL_CREAM | Freq: Two times a day (BID) | CUTANEOUS | 0 refills | Status: AC

## 2023-11-10 MED ORDER — SILDENAFIL CITRATE 20 MG PO TABS: 20.0000 mg | ORAL_TABLET | Freq: Every day | ORAL | 6 refills | Status: AC | PRN

## 2023-11-10 NOTE — Progress Notes (Signed)
   11/10/2023 11:08 AM   Leda Prude [birth year] [id number]  Reason for visit: ED, urinary symptoms, PSA screening, history of gross hematuria  HPI: 68 year old male with history of an interstitial cystitis type picture that started in 2020 and symptoms have improved significantly since that time.  Cystoscopy was negative at that time, and he has been using Uribel  as needed for flares of bladder pain/dysuria with good results.  He denies any significant urinary complaints today.  He uses the Uribel  just a few times a month, he is unsure if there are any exacerbations diet wise.  He started Xarelto  for a superficial thrombophlebitis back in March 2022 and had some gross hematuria.  At that time he underwent a CT urogram which was benign, and cystoscopy which showed some friable prostate vessels but was otherwise normal, his hematuria has since resolved since that time and he is no longer on anticoagulation.  Currently using Cialis  5 mg on demand for ED-he feels like he has decreased sensation with the Cialis  and is interested in a trial of a different medication.  Risks and benefits of sildenafil  discussed, and will trial sildenafil  20 to 100 mg on demand.  Recent PSA normal at 1.2 and stable from prior.  Can consider discontinuing screening per the guideline recommendations  -Trial of sildenafil  20 to 100 mg on demand to replace Cialis , can return to Cialis  if he prefers in the future -Continue Uribel  as needed for rare episodes of dysuria, we discussed possible foods that could cause flares of his IC type symptoms -RTC 1 year symptom check  Lawerence Pressman, MD  [facility] [address] [place], [place] [address] [phone]

## 2023-11-10 NOTE — Patient Instructions (Signed)
 Eating Plan for Interstitial Cystitis Interstitial cystitis (IC) is a long-term (chronic) condition that can cause pain and pressure near your bladder. It can also make you have to pee urgently and often. Symptoms may come and go. Certain foods may trigger your symptoms. Learning what foods bother you can help you come up with an eating plan to manage IC. What are tips for following this plan? You may want to work with an expert in healthy eating called a dietitian. They can help you make an eating plan by doing an elimination diet. This diet involves: Making a list of foods that you think trigger your symptoms. You may also want to include foods that often cause symptoms in other people with IC. It may take a few months to find out which foods bother you. Taking those foods out of your diet for about a month. After that month, you can try to have the foods again one at a time to see which ones cause symptoms. Reading food labels Once you know which foods trigger your symptoms, you can avoid them. But it's still a good idea to read food labels. Some foods that cause your symptoms may be ingredients in other foods. These foods may include: Soy. Worcestershire sauce. Vinegar. Alcohol. Artificial sweeteners. Monosodium glutamate. Other triggers may include: Chili peppers. Tomato products. Citrus fruits, flavors, or juices. Shopping Shopping can be hard if many foods trigger your IC. Bring a list of the foods you can't eat with you when you go to the store. You can get an app for your phone that lets you know which foods are safer and which ones you may want to avoid. You can find the app at the [employer] website: ic-network.com Meal planning Plan your meals based on the results of your elimination diet. If you haven't done the diet yet, plan meals based on IC food lists. These lists may be given to you by your health care provider or dietitian. The lists tell you which foods are least and most  likely to cause symptoms. Avoid certain types of food when you go out to eat. These may include: Pizza. [place] food. [place] food. [place] food. General information Do not eat large portions. Drink lots of fluids with your meals. Do not eat foods that are high in sugar, salt, or saturated fat. Choose whole fruits instead of juice. Eat a colorful variety of vegetables. Find ways to manage stress. Get enough exercise. What foods should I eat? For people with IC, the best diet is a balanced one. This means it includes things from all the food groups. Even if you have to avoid certain foods, there are still lots of healthy choices in each group. Below are some foods that may be safest for you to eat: Fruits Bananas. Blueberries and blueberry juice. Melons. Pears. Apples. Dates. Prunes. Raisins. Apricots. Vegetables Asparagus. Avocado. Celery. Beets. Bell peppers. Black olives. Broccoli. Brussels sprouts. Cabbage. Carrots. Cauliflower. Cucumber. Eggplant. Green beans. Potatoes. Radishes. Spinach. Squash. Turnips. Zucchini. Mushrooms. Peas. Grains Oats. Rice. Bran. Oatmeal. Whole wheat bread. Meats and other proteins Beef. Fish and other seafood. Eggs. Nuts. Peanut butter. Pork. Poultry. Lamb. Garbanzo beans. Pinto beans. Dairy Whole or low-fat milk. American, mozzarella, mild cheddar, feta, ricotta, and cream cheeses. The items listed above may not be all the foods and drinks you can have. Talk to a dietitian to learn more. What foods should I avoid? You should avoid any foods that cause symptoms. It's also a good idea to avoid  foods that often cause symptoms in many people with IC. These foods include: Fruits Citrus fruits, such as lemons, limes, oranges, and grapefruit. Cranberries. Strawberries. Pineapple. Kiwi. Vegetables Chili peppers. Onions. Sauerkraut. Tomato and tomato products. Rosita Fire. Grains You don't need to avoid any type of grain unless it causes symptoms. Meats and other  proteins Precooked or cured meats, such as sausages or meat loaves. Soy products. Dairy Chocolate ice cream. Processed cheese. Yogurt. Drinks Alcohol. Chocolate drinks. Coffee. Cranberry juice. Fizzy drinks. Black, green, or herbal tea. Tomato juice. Sports drinks. The items listed above may not be all the foods and drinks you should avoid. Talk to a dietitian to learn more. This information is not intended to replace advice given to you by your health care provider. Make sure you discuss any questions you have with your health care provider. Document Revised: 10/16/2022 Document Reviewed: 10/16/2022 Elsevier Patient Education  2024 [employer].

## 2023-11-20 NOTE — Progress Notes (Unsigned)
 Virtual Visit via Video Note  I connected with Jared [name] on 11/25/23 at  8:20 AM EDT by a video enabled telemedicine application and verified that I am speaking with the correct person using two identifiers.  Location: Patient: car Provider: home office Persons participated in the visit- patient, provider    I discussed the limitations of evaluation and management by telemedicine and the availability of in person appointments. The patient expressed understanding and agreed to proceed.   I discussed the assessment and treatment plan with the patient. The patient was provided an opportunity to ask questions and all were answered. The patient agreed with the plan and demonstrated an understanding of the instructions.   The patient was advised to call back or seek an in-person evaluation if the symptoms worsen or if the condition fails to improve as anticipated.   Todd Fossa, MD    [facility] MD/PA/NP OP Progress Note  11/25/2023 11:09 AM Jared [name]  MRN:  [id number]  Chief Complaint:  Chief Complaint  Patient presents with   Follow-up   HPI:  This is a follow-up appointment for depression and fatigue.  He states that he lost a lot of inertia.  He is not doing things.  Although he does not feel depressed, he does not feel happy.  He does not feel happy with the medication.  It is disappointing that he is not able to perform things such as housekeeping.  He tends to procrastinate such as watering the yard.  He continues Agricultural consultant work.  He serves food to the community.  He identified himself as Air traffic controller, and is doing activities at [employer].  Although he does find the meaning in these, and he feels even keeled, he does not feel happiness.  His wife has been sick due to respiratory issues.  She was previously struggling with allergy.  Although he will consider psychotherapy, he would like to have a medication change.  He has fair sleep.  He has decrease in appetite, although he is  not concerned about his weight.  He denies anxiety.  He denies SI.  He denies alcohol use or drug use.   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 MDD (major depressive disorder), recurrent, in partial remission (HCC)  F33.41 TSH    T4, free    2. Fatigue, unspecified type  R53.83     3. Vitamin D deficiency  E55.9 VITAMIN D 25 Hydroxy (Vit-D Deficiency, Fractures)    4. Screening for iron deficiency anemia  Z13.0 Ferritin      Past Psychiatric History: Please see initial evaluation for full details. I have reviewed the history. No updates at this time.     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   CHOLECYSTECTOMY     added umbilical mesh   COLONOSCOPY WITH PROPOFOL  N/A 03/27/2017   Procedure: COLONOSCOPY WITH PROPOFOL ;  Surgeon: Cassie Click, MD;  Location: [facility] ENDOSCOPY;  Service: Endoscopy;  Laterality: N/A;   COLONOSCOPY WITH PROPOFOL  N/A 10/07/2022   Procedure: COLONOSCOPY WITH PROPOFOL ;  Surgeon: Shane Darling, MD;   Location: [facility] ENDOSCOPY;  Service: Endoscopy;  Laterality: N/A;   ESOPHAGOGASTRODUODENOSCOPY (EGD) WITH PROPOFOL  N/A 03/27/2017   Procedure: ESOPHAGOGASTRODUODENOSCOPY (EGD) WITH PROPOFOL ;  Surgeon: Cassie Click, MD;  Location: [facility] ENDOSCOPY;  Service: Endoscopy;  Laterality: N/A;   hip fracture with rod placement Right    MASS EXCISION Left 05/24/2021   Procedure: EXCISION MASS;  Surgeon: Conrado Delay, DO;  Location: [facility] ORS;  Service: General;  Laterality: Left;  Prone postition    Family Psychiatric History: Please see initial evaluation for full details. I have reviewed the history. No updates at this time.     Family History:  Family History  Problem Relation Age of Onset   Cancer Mother    Depression Mother    Stroke Father    Depression Father    Aneurysm Father    Depression Sister    Depression Sister    Cancer Brother    Depression Brother     Social History:  Social History   Socioeconomic History   Marital status: Married    Spouse name: Not on file   Number of children: 2   Years of education: Not on file   Highest education level: Associate degree: occupational, Scientist, product/process development, or vocational program  Occupational History   Not on file  Tobacco Use   Smoking status: Former    Current packs/day: 0.00    Average packs/day: 1 pack/day for 45.0 years (45.0 ttl pk-yrs)    Types: Cigarettes    Start date: 01/29/1974    Quit date: 01/30/2019    Years since quitting: 4.8    Passive exposure: Past   Smokeless tobacco: Never  Vaping Use   Vaping status: Former  Substance and Sexual Activity   Alcohol use: No   Drug use: No   Sexual activity: Yes    Birth control/protection: None  Other Topics Concern   Not on file  Social History Narrative   Lives with wife   Social Drivers of Health   Financial Resource Strain: Low Risk  (04/08/2023)   Received from [facility]   Overall Financial Resource Strain (CARDIA)    Difficulty of Paying  Living Expenses: Not hard at all  Food Insecurity: No Food Insecurity (04/08/2023)   Received from [facility]   Hunger Vital Sign    Worried About Running Out of Food in the Last Year: Never true    Ran Out of Food in the Last Year: Never true  Transportation Needs: No Transportation Needs (04/08/2023)   Received from [facility] - Transportation    In the past 12 months, has lack of transportation kept you from medical appointments or from getting medications?: No  Lack of Transportation (Non-Medical): No  Physical Activity: Not on file  Stress: Not on file  Social Connections: Not on file    Allergies: No Known Allergies  Metabolic Disorder Labs: Lab Results  Component Value Date   HGBA1C 6.2 (H) 05/19/2022   No results found for: "PROLACTIN" No results found for: "CHOL", "TRIG", "HDL", "CHOLHDL", "VLDL", "LDLCALC" Lab Results  Component Value Date   TSH 0.850 05/19/2022    Therapeutic Level Labs: No results found for: "LITHIUM" No results found for: "VALPROATE" No results found for: "CBMZ"  Current Medications: Current Outpatient Medications  Medication Sig Dispense Refill   buPROPion  (WELLBUTRIN  XL) 300 MG 24 hr tablet Take 1 tablet (300 mg total) by mouth daily. 30 tablet 3   clotrimazole -betamethasone  (LOTRISONE ) cream Apply 1 Application topically 2 (two) times daily. 30 g 0   escitalopram  (LEXAPRO ) 20 MG tablet Take 1 tablet (20 mg total) by mouth daily. 30 tablet 5   ibuprofen (ADVIL,MOTRIN) 200 MG tablet Take 400 mg by mouth every 6 (six) hours as needed for mild pain.     Meth-Hyo-M Bl-Na Phos-Ph Sal (URIBEL ) 118 MG CAPS Take 1 capsule (118 mg total) by mouth daily as needed (dysuria). 30 capsule 11   methocarbamol (ROBAXIN) 500 MG tablet Take 250 mg by mouth at bedtime as needed for muscle spasms.     pantoprazole (PROTONIX) 40 MG tablet Take 40 mg by mouth daily.     sildenafil  (REVATIO ) 20 MG tablet Take 1  tablet (20 mg total) by mouth daily as needed (TAKE 30 MINUTES PRIOR TO SEXUAL ACTIVITY). 30 tablet 6   tadalafil  (CIALIS ) 10 MG tablet Take 1 tablet (10 mg total) by mouth daily as needed for erectile dysfunction. 30 tablet 11   No current facility-administered medications for this visit.     Musculoskeletal: Strength & Muscle Tone: within normal limits Gait & Station: normal Patient leans: N/A  Psychiatric Specialty Exam: Review of Systems  Psychiatric/Behavioral:  Positive for dysphoric mood. Negative for agitation, behavioral problems, confusion, decreased concentration, hallucinations, self-injury, sleep disturbance and suicidal ideas. The patient is not nervous/anxious and is not hyperactive.   All other systems reviewed and are negative.   There were no vitals taken for this visit.There is no height or weight on file to calculate BMI.  General Appearance: Well Groomed  Eye Contact:  Good  Speech:  Clear and Coherent  Volume:  Normal  Mood:   happy  Affect:  Appropriate, Congruent, and slightly down  Thought Process:  Coherent  Orientation:  Full (Time, Place, and Person)  Thought Content: Logical   Suicidal Thoughts:  No  Homicidal Thoughts:  No  Memory:  Immediate;   Good  Judgement:  Good  Insight:  Good  Psychomotor Activity:  Normal  Concentration:  Concentration: Good and Attention Span: Good  Recall:  Good  Fund of Knowledge: Good  Language: Good  Akathisia:  No  Handed:  Right  AIMS (if indicated): not done  Assets:  Communication Skills Desire for Improvement  ADL's:  Intact  Cognition: WNL  Sleep:  Good   Screenings: Mini-Mental    Flowsheet Row Office Visit from 05/19/2022 in [facility]  Total Score (max 30 points ) 27      PHQ2-9    Flowsheet Row Counselor from 06/09/2022 in [facility] [place] Counselor from 09/18/2021 in [facility]  Office Visit from 08/19/2021 in [facility]  PHQ-2 Total Score 1 4 4   PHQ-9 Total Score -- 12 14      Flowsheet Row Admission (Discharged) from 10/07/2022 in [facility] Counselor from 09/09/2022 in [facility] ED from 08/21/2022 in [facility] at [facility]  C-SSRS RISK CATEGORY Error: Question 6 not populated No Risk No Risk        Assessment and Plan:  Jared [name] is a 68 y.o. year old male with a history of depression, sleep apnea on CPAP, arthritis, GERD, pancreatic neuroendocrine tumor s/p excision, who presents for follow up appointment for below.   1. MDD (major depressive disorder), recurrent, in partial remission (HCC) Acute stressors include: gait imbalances,  Other stressors include:   mother in law moved to a facility in [facility] History: had more than 2 episodes of depression in the past, relapse in mood symptoms when tapering down bupropion    He reports worsening in anhedonia, although he denies feeling depressed.  Explore the possibility of adjustment difficulties related to his new lifestyle following retirement. He has been engaging in volunteer work and remains involved in his church community.  However, he has strong preference to adjust his medication to attend to this.  Will start mirtazapine as adjunctive treatment for depression as he also noted appetite loss.  Discussed potential risk of drowsiness, increasing in appetite.   2. Fatigue, unspecified type 3. Vitamin D deficiency - uses CPAP machine. He reports slight low energy.  Will recheck vitamin D given he previously had deficiency.   4. Screening for iron deficiency anemia Will check labs to rule out medical health issues contributing to fatigue.    # cognitive impairment - he was seen by neurologist. MMSE 27/30 per report 09/2022 Unchanged  He reports occasional  difficulty in remembering names of people.  IADL independent.  Noted that he was evaluated by his neurologist symptoms in 2021 with brain MRI, and labs, which are unremarkable to explain his symptoms.  Although he was recommended to obtain labs, he would like to defer this to his pcp visit.   Plan Continue bupropion  300 mg daily  (felt zombie at 450 mg)  Continue escitalopram  20 mg daily (QTc 412 msec in 04/2021, HR 52 sinus bradycardia)  Start mirtazapine 7.5 mg at night Obtain labs- TSH, freeT4, vitamin D, ferritin Next appointment: 5/7 at 8 20,  video [email or web address]   The patient demonstrates the following risk factors for suicide: Chronic risk factors for suicide include: psychiatric disorder of depression . Acute risk factors for suicide include: family or marital conflict. Protective factors for this patient include: hope for the future and religious beliefs against suicide. Considering these factors, the overall suicide risk at this point appears to be low. Patient is appropriate for outpatient follow up.       Collaboration of Care: Collaboration of Care: Other reviewed notes in Epic  Patient/Guardian was advised Release of Information must be obtained prior to any record release in order to collaborate their care with an outside provider. Patient/Guardian was advised if they have not already done so to contact the registration department to sign all necessary forms in order for us  to release information regarding their care.   Consent: Patient/Guardian gives verbal consent for treatment and assignment of benefits for services provided during this visit. Patient/Guardian expressed understanding and agreed to proceed.    Todd Fossa, MD 11/25/2023, 11:09 AM

## 2023-11-25 ENCOUNTER — Encounter: Payer: Self-pay | Admitting: Psychiatry

## 2023-11-25 ENCOUNTER — Telehealth (INDEPENDENT_AMBULATORY_CARE_PROVIDER_SITE_OTHER): Payer: Self-pay | Admitting: Psychiatry

## 2023-11-25 DIAGNOSIS — F3341 Major depressive disorder, recurrent, in partial remission: Secondary | ICD-10-CM

## 2023-11-25 DIAGNOSIS — R5383 Other fatigue: Secondary | ICD-10-CM

## 2023-11-25 DIAGNOSIS — Z130 Encounter for screening for diseases of the blood and blood-forming organs and certain disorders involving the immune mechanism: Secondary | ICD-10-CM

## 2023-11-25 DIAGNOSIS — E559 Vitamin D deficiency, unspecified: Secondary | ICD-10-CM | POA: Diagnosis not present

## 2023-11-25 MED ORDER — BUPROPION HCL ER (XL) 300 MG PO TB24: 300.0000 mg | ORAL_TABLET | Freq: Every day | ORAL | 3 refills | Status: DC

## 2023-11-25 MED ORDER — ESCITALOPRAM OXALATE 20 MG PO TABS: 20.0000 mg | ORAL_TABLET | Freq: Every day | ORAL | 5 refills | Status: DC

## 2023-11-25 MED ORDER — MIRTAZAPINE 7.5 MG PO TABS: 7.5000 mg | ORAL_TABLET | Freq: Every day | ORAL | 1 refills | Status: DC

## 2023-11-25 NOTE — Patient Instructions (Signed)
 Continue bupropion  300 mg daily Continue escitalopram  20 mg daily  Start mirtazapine 7.5 mg at night Obtain labs- TSH, freeT4, vitamin D, ferritin Next appointment: 5/7 at 8 20

## 2023-12-15 DIAGNOSIS — G4733 Obstructive sleep apnea (adult) (pediatric): Secondary | ICD-10-CM | POA: Diagnosis not present

## 2024-01-03 NOTE — Progress Notes (Signed)
 Virtual Visit via Video Note  I connected with Jared [name] on 01/08/24 at  8:20 AM EDT by a video enabled telemedicine application and verified that I am speaking with the correct person using two identifiers.  Location: Patient: home Provider: home office Persons participated in the visit- patient, provider    I discussed the limitations of evaluation and management by telemedicine and the availability of in person appointments. The patient expressed understanding and agreed to proceed.    I discussed the assessment and treatment plan with the patient. The patient was provided an opportunity to ask questions and all were answered. The patient agreed with the plan and demonstrated an understanding of the instructions.   The patient was advised to call back or seek an in-person evaluation if the symptoms worsen or if the condition fails to improve as anticipated.   Todd Fossa, MD    [facility] MD/PA/NP OP Progress Note  01/08/2024 8:48 AM Jared [name]  MRN:  [id number]  Chief Complaint:  Chief Complaint  Patient presents with   Follow-up   HPI:  This is a follow-up appointment for depression and fatigue.  He states that he has been doing better.  He thinks his mood has leveled off.  He has deeper sleep.  He also enjoys summertime.  He feels good about accomplishment after cutting grass, and doing yard work.  He is also thinking of picking up another volunteer work at another religious organization within [employer].  Although he feels anxious as he is introvert, he does not mind trying it.  He feels that his thoughts are clear.  However, he would like to try even higher dose.  When he is asked if there are any areas he is concerned, he states that it is hard to pinpoint.  He reports increase in appetite, and is concerned about mild weight gain.  He continues to have some dizzy feeling when he turns his head, although he denies any change since starting mirtazapine .  He denies SI,  HI, hallucinations.  He agrees with the plans as outlined below.   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 MDD (major depressive disorder), recurrent, in partial remission (HCC)  F33.41     2. Fatigue, unspecified type  R53.83       Past Psychiatric History: Please see initial evaluation for full details. I have reviewed the history. No updates at this time.     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   CHOLECYSTECTOMY     added umbilical mesh   COLONOSCOPY WITH PROPOFOL  N/A 03/27/2017   Procedure: COLONOSCOPY WITH PROPOFOL ;  Surgeon: Cassie Click, MD;  Location: [facility] ENDOSCOPY;  Service: Endoscopy;  Laterality: N/A;   COLONOSCOPY WITH PROPOFOL  N/A 10/07/2022   Procedure: COLONOSCOPY WITH PROPOFOL ;  Surgeon: Shane Darling, MD;  Location: [facility] ENDOSCOPY;  Service: Endoscopy;  Laterality: N/A;   ESOPHAGOGASTRODUODENOSCOPY (EGD) WITH PROPOFOL  N/A 03/27/2017   Procedure: ESOPHAGOGASTRODUODENOSCOPY (EGD) WITH PROPOFOL ;  Surgeon:  Cassie Click, MD;  Location: [facility] ENDOSCOPY;  Service: Endoscopy;  Laterality: N/A;   hip fracture with rod placement Right    MASS EXCISION Left 05/24/2021   Procedure: EXCISION MASS;  Surgeon: Conrado Delay, DO;  Location: [facility] ORS;  Service: General;  Laterality: Left;  Prone postition    Family Psychiatric History: Please see initial evaluation for full details. I have reviewed the history. No updates at this time.     Family History:  Family History  Problem Relation Age of Onset   Cancer Mother    Depression Mother    Stroke Father    Depression Father    Aneurysm Father    Depression Sister    Depression Sister    Cancer Brother    Depression Brother     Social History:  Social History   Socioeconomic History   Marital status: Married    Spouse name: Not on file   Number of children: 2   Years of education: Not on file   Highest education level: Associate degree: occupational, Scientist, product/process development, or vocational program  Occupational History   Not on file  Tobacco Use   Smoking status: Former    Current packs/day: 0.00    Average packs/day: 1 pack/day for 45.0 years (45.0 ttl pk-yrs)    Types: Cigarettes    Start date: 01/29/1974    Quit date: 01/30/2019    Years since quitting: 4.9    Passive exposure: Past   Smokeless tobacco: Never  Vaping Use   Vaping status: Former  Substance and Sexual Activity   Alcohol use: No   Drug use: No   Sexual activity: Yes    Birth control/protection: None  Other Topics Concern   Not on file  Social History Narrative   Lives with wife   Social Drivers of Health   Financial Resource Strain: Low Risk  (04/08/2023)   Received from [facility]   Overall Financial Resource Strain (CARDIA)    Difficulty of Paying Living Expenses: Not hard at all  Food Insecurity: No Food Insecurity (04/08/2023)   Received from [facility]   Hunger Vital Sign    Within the past 12 months, you worried that your  food would run out before you got the money to buy more.: Never true    Within the past 12 months, the food you bought just didn't last and you didn't have money to get more.: Never true  Transportation Needs: No Transportation Needs (04/08/2023)   Received from [facility] - Transportation    In the past 12 months, has lack of transportation kept you from medical appointments or from getting medications?: No    Lack of Transportation (Non-Medical): No  Physical Activity: Not on file  Stress: Not  on file  Social Connections: Not on file    Allergies: No Known Allergies  Metabolic Disorder Labs: Lab Results  Component Value Date   HGBA1C 6.2 (H) 05/19/2022   No results found for: PROLACTIN No results found for: CHOL, TRIG, HDL, CHOLHDL, VLDL, LDLCALC Lab Results  Component Value Date   TSH 0.848 01/05/2024   TSH 0.850 05/19/2022    Therapeutic Level Labs: No results found for: LITHIUM No results found for: VALPROATE No results found for: CBMZ  Current Medications: Current Outpatient Medications  Medication Sig Dispense Refill   mirtazapine  (REMERON ) 15 MG tablet Take 1 tablet (15 mg total) by mouth at bedtime. 30 tablet 1   buPROPion  (WELLBUTRIN  XL) 300 MG 24 hr tablet Take 1 tablet (300 mg total) by mouth daily. 30 tablet 3   clotrimazole -betamethasone  (LOTRISONE ) cream Apply 1 Application topically 2 (two) times daily. 30 g 0   escitalopram  (LEXAPRO ) 20 MG tablet Take 1 tablet (20 mg total) by mouth daily. 30 tablet 5   ibuprofen (ADVIL,MOTRIN) 200 MG tablet Take 400 mg by mouth every 6 (six) hours as needed for mild pain.     Meth-Hyo-M Bl-Na Phos-Ph Sal (URIBEL ) 118 MG CAPS Take 1 capsule (118 mg total) by mouth daily as needed (dysuria). 30 capsule 11   methocarbamol (ROBAXIN) 500 MG tablet Take 250 mg by mouth at bedtime as needed for muscle spasms.     mirtazapine  (REMERON ) 7.5 MG tablet Take 1 tablet (7.5 mg total) by  mouth at bedtime. (Patient not taking: Reported on 01/08/2024) 30 tablet 1   pantoprazole (PROTONIX) 40 MG tablet Take 40 mg by mouth daily.     sildenafil  (REVATIO ) 20 MG tablet Take 1 tablet (20 mg total) by mouth daily as needed (TAKE 30 MINUTES PRIOR TO SEXUAL ACTIVITY). 30 tablet 6   tadalafil  (CIALIS ) 10 MG tablet Take 1 tablet (10 mg total) by mouth daily as needed for erectile dysfunction. 30 tablet 11   No current facility-administered medications for this visit.     Musculoskeletal: Strength & Muscle Tone: N/A Gait & Station: N/A Patient leans: N/A  Psychiatric Specialty Exam: Review of Systems  Psychiatric/Behavioral:  Negative for agitation, behavioral problems, confusion, decreased concentration, dysphoric mood, hallucinations, self-injury, sleep disturbance and suicidal ideas. The patient is not nervous/anxious and is not hyperactive.   All other systems reviewed and are negative.   There were no vitals taken for this visit.There is no height or weight on file to calculate BMI.  General Appearance: Well Groomed  Eye Contact:  Good  Speech:  Clear and Coherent  Volume:  Normal  Mood:  better  Affect:  Appropriate, Congruent, and Full Range  Thought Process:  Coherent  Orientation:  Full (Time, Place, and Person)  Thought Content: Logical   Suicidal Thoughts:  No  Homicidal Thoughts:  No  Memory:  Immediate;   Good  Judgement:  Good  Insight:  Good  Psychomotor Activity:  Normal  Concentration:  Concentration: Good and Attention Span: Good  Recall:  Good  Fund of Knowledge: Good  Language: Good  Akathisia:  No  Handed:  Right  AIMS (if indicated): not done  Assets:  Communication Skills Desire for Improvement  ADL's:  Intact  Cognition: WNL  Sleep:  Good   Screenings: Mini-Mental    Flowsheet Row Office Visit from 05/19/2022 in [facility]  Total Score (max 30 points ) 27   PHQ2-9    Flowsheet Row Counselor from  06/09/2022  in [facility] from 09[facility]18/2021 in [facility] Office Visit from 08/19/2021 in [facility]  PHQ-2 Total Score 1 4 4   PHQ-9 Total Score -- 12 14   Flowsheet Row Admission (Discharged) from 10/07/2022 in [facility] Counselor from 09/09/2022 in [facility] ED from 08/21/2022 in [facility] at [facility]  C-SSRS RISK CATEGORY Error: Question 6 not populated No Risk No Risk     Assessment and Plan:  Jared [name] is a 68 y.o. year old male with a history of depression, sleep apnea on CPAP, arthritis, GERD, pancreatic neuroendocrine tumor s/p excision, who presents for follow up appointment for below.   1. MDD (major depressive disorder), recurrent, in partial remission (HCC) 2. Fatigue, unspecified type Acute stressors include: gait imbalances,  Other stressors include:   mother in law moved to a facility in [place] History: had more than 2 episodes of depression in the past, relapse in mood symptoms when tapering down bupropion     He reports slight improvement in anhedonia, and has been more active with less concern for fatigue since starting mirtazapine .  Although there is a concerning adverse reaction of increase in appetite, he would like to try even higher dose to see how it works for his mood.  Discussed potential risk of increase in appetite, weight gain and drowsiness.  Will continue current dose of Lexapro  and bupropion  to target depression.    2. Fatigue, unspecified type - uses CPAP machine. - TSH, vitamin D, ferritin wnl 12/2023 Labs results were reviewed.  He agrees to try the above intervention.    # cognitive impairment - he was seen by neurologist. MMSE 27/30 per report 09/2022 Unchanged  He reports occasional difficulty in remembering  names of people.  IADL independent.  Noted that he was evaluated by his neurologist symptoms in 2021 with brain MRI, and labs, which are unremarkable to explain his symptoms.  Although he was recommended to obtain labs, he would like to defer this to his pcp visit.   Plan Continue bupropion  300 mg daily  (felt zombie at 450 mg)  Continue escitalopram  20 mg daily (QTc 412 msec in 04/2021, HR 52 sinus bradycardia)  Increase mirtazapine  15 mg at night - monitor appetite Next appointment: 8/1 at 8 40,  video [email or web address]   The patient demonstrates the following risk factors for suicide: Chronic risk factors for suicide include: psychiatric disorder of depression . Acute risk factors for suicide include: family or marital conflict. Protective factors for this patient include: hope for the future and religious beliefs against suicide. Considering these factors, the overall suicide risk at this point appears to be low. Patient is appropriate for outpatient follow up.   Collaboration of Care: Collaboration of Care: Other reviewed notes in Epic  Patient/Guardian was advised Release of Information must be obtained prior to any record release in order to collaborate their care with an outside provider. Patient/Guardian was advised if they have not already done so to contact the registration department to sign all necessary forms in order for us  to release information regarding their care.   Consent: Patient/Guardian gives verbal consent for treatment and assignment of benefits for services provided during this visit. Patient/Guardian expressed understanding and agreed to proceed.    Todd Fossa, MD 01/08/2024, 8:48 AM

## 2024-01-05 DIAGNOSIS — Z130 Encounter for screening for diseases of the blood and blood-forming organs and certain disorders involving the immune mechanism: Secondary | ICD-10-CM | POA: Diagnosis not present

## 2024-01-05 DIAGNOSIS — F3341 Major depressive disorder, recurrent, in partial remission: Secondary | ICD-10-CM | POA: Diagnosis not present

## 2024-01-05 DIAGNOSIS — E559 Vitamin D deficiency, unspecified: Secondary | ICD-10-CM | POA: Diagnosis not present

## 2024-01-06 ENCOUNTER — Ambulatory Visit: Payer: Self-pay | Admitting: Psychiatry

## 2024-01-06 LAB — FERRITIN: Ferritin: 163 ng/mL (ref 30–400)

## 2024-01-06 LAB — T4, FREE: Free T4: 1 ng/dL (ref 0.82–1.77)

## 2024-01-06 LAB — TSH: TSH: 0.848 u[IU]/mL (ref 0.450–4.500)

## 2024-01-06 LAB — VITAMIN D 25 HYDROXY (VIT D DEFICIENCY, FRACTURES): Vit D, 25-Hydroxy: 34.5 ng/mL (ref 30.0–100.0)

## 2024-01-08 ENCOUNTER — Encounter: Payer: Self-pay | Admitting: Psychiatry

## 2024-01-08 ENCOUNTER — Telehealth: Admitting: Psychiatry

## 2024-01-08 DIAGNOSIS — F3341 Major depressive disorder, recurrent, in partial remission: Secondary | ICD-10-CM | POA: Diagnosis not present

## 2024-01-08 DIAGNOSIS — R5383 Other fatigue: Secondary | ICD-10-CM

## 2024-01-08 MED ORDER — MIRTAZAPINE 15 MG PO TABS: 15.0000 mg | ORAL_TABLET | Freq: Every day | ORAL | 1 refills | Status: DC

## 2024-01-08 NOTE — Patient Instructions (Signed)
 Continue bupropion  300 mg daily  Continue escitalopram  20 mg daily  Increase mirtazapine  15 mg at night Next appointment: 8/1 at 8 40

## 2024-01-15 DIAGNOSIS — G4733 Obstructive sleep apnea (adult) (pediatric): Secondary | ICD-10-CM | POA: Diagnosis not present

## 2024-01-25 ENCOUNTER — Other Ambulatory Visit: Payer: Self-pay | Admitting: Psychiatry

## 2024-02-13 NOTE — Progress Notes (Unsigned)
 Virtual Visit via Video Note  I connected with Jared [name] on 02/19/24 at  8:40 AM EDT by a video enabled telemedicine application and verified that I am speaking with the correct person using two identifiers.  Location: Patient: home Provider: home office Persons participated in the visit- patient, provider    I discussed the limitations of evaluation and management by telemedicine and the availability of in person appointments. The patient expressed understanding and agreed to proceed.     I discussed the assessment and treatment plan with the patient. The patient was provided an opportunity to ask questions and all were answered. The patient agreed with the plan and demonstrated an understanding of the instructions.   The patient was advised to call back or seek an in-person evaluation if the symptoms worsen or if the condition fails to improve as anticipated.    Katheren Sleet, MD    [facility] MD/PA/NP OP Progress Note  02/19/2024 9:34 AM [name]  MRN:  [id number]  Chief Complaint:  Chief Complaint  Patient presents with   Follow-up   HPI:  This is a follow-up appointment for depression.  He states that he has been doing well.  He has a more grasp of reality.  He states that he has more quality in what he is doing, and is able to think.  He does not feel as cloudy emotionally, and reports good benefit from mirtazapine .  However, he has noticed weight gain since uptitration, which is concerning to him.  He is doing binge eating, which she never experienced before.  Continues to be involved in volunteer activities.  He denies feeling depressed or anxiety.  He has good sleep.  He denies SI, HI, hallucinations.  He agrees with the plans as outlined below.    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 MDD (major depressive disorder), recurrent, in partial remission (HCC)  F33.41       Past Psychiatric History: Please see initial evaluation for full details. I have reviewed the history. No updates at this time.     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   CHOLECYSTECTOMY     added umbilical mesh   COLONOSCOPY WITH PROPOFOL  N/A 03/27/2017   Procedure: COLONOSCOPY WITH PROPOFOL ;  Surgeon: Viktoria Lamar DASEN, MD;  Location: [facility] ENDOSCOPY;  Service: Endoscopy;  Laterality: N/A;   COLONOSCOPY WITH PROPOFOL  N/A 10/07/2022   Procedure: COLONOSCOPY WITH PROPOFOL ;  Surgeon: Maryruth Ole DASEN, MD;  Location: [facility] ENDOSCOPY;  Service: Endoscopy;  Laterality: N/A;   ESOPHAGOGASTRODUODENOSCOPY (EGD) WITH PROPOFOL  N/A 03/27/2017   Procedure: ESOPHAGOGASTRODUODENOSCOPY (EGD) WITH PROPOFOL ;  Surgeon: Viktoria Lamar DASEN, MD;  Location: [facility] ENDOSCOPY;  Service: Endoscopy;  Laterality: N/A;   hip fracture with rod placement Right    MASS EXCISION Left 05/24/2021   Procedure: EXCISION MASS;  Surgeon: Tye Millet, DO;  Location: [facility] ORS;  Service: General;  Laterality: Left;  Prone postition    Family Psychiatric  History: Please see initial evaluation for full details. I have reviewed the history. No updates at this time.     Family History:  Family History  Problem Relation Age of Onset   Cancer Mother    Depression Mother    Stroke Father    Depression Father    Aneurysm Father    Depression Sister    Depression Sister    Cancer Brother    Depression Brother     Social History:  Social History   Socioeconomic History   Marital status: Married    Spouse name: Not on file   Number of children: 2   Years of education: Not on file   Highest education level: Associate degree: occupational, Scientist, product/process development, or vocational program  Occupational History   Not on file  Tobacco Use   Smoking status: Former    Current packs/day: 0.00    Average packs/day: 1 pack/day for 45.0 years (45.0 ttl pk-yrs)    Types: Cigarettes    Start date: 01/29/1974    Quit date: 01/30/2019    Years since quitting: 5.0    Passive exposure: Past   Smokeless tobacco: Never  Vaping Use   Vaping status: Former  Substance and Sexual Activity   Alcohol use: No   Drug use: No   Sexual activity: Yes    Birth control/protection: None  Other Topics Concern   Not on file  Social History Narrative   Lives with wife   Social Drivers of Health   Financial Resource Strain: Low Risk  (04/08/2023)   Received from [facility]   Overall Financial Resource Strain (CARDIA)    Difficulty of Paying Living Expenses: Not hard at all  Food Insecurity: No Food Insecurity (04/08/2023)   Received from [facility]   Hunger Vital Sign    Within the past 12 months, you worried that your food would run out before you got the money to buy more.: Never true    Within the past 12 months, the food you bought just didn't last and you didn't have money to get more.: Never true  Transportation Needs: No Transportation Needs (04/08/2023)   Received from [facility] - Transportation     In the past 12 months, has lack of transportation kept you from medical appointments or from getting medications?: No    Lack of Transportation (Non-Medical): No  Physical Activity: Not on file  Stress: Not on file  Social Connections: Not on file    Allergies: No Known Allergies  Metabolic Disorder Labs: Lab Results  Component Value Date   HGBA1C 6.2 (H) 05/19/2022   No results found for: PROLACTIN No results found for: CHOL, TRIG, HDL, CHOLHDL, VLDL, LDLCALC Lab Results  Component Value Date   TSH  0.848 01/05/2024   TSH 0.850 05/19/2022    Therapeutic Level Labs: No results found for: LITHIUM No results found for: VALPROATE No results found for: CBMZ  Current Medications: Current Outpatient Medications  Medication Sig Dispense Refill   mirtazapine  (REMERON ) 15 MG tablet Take 1 tablet (15 mg total) by mouth at bedtime. (Patient taking differently: Take 7.5 mg by mouth at bedtime.) 30 tablet 1   buPROPion  (WELLBUTRIN  XL) 300 MG 24 hr tablet Take 1 tablet (300 mg total) by mouth daily. 30 tablet 3   clotrimazole -betamethasone  (LOTRISONE ) cream Apply 1 Application topically 2 (two) times daily. 30 g 0   escitalopram  (LEXAPRO ) 20 MG tablet Take 1 tablet (20 mg total) by mouth daily. 30 tablet 5   ibuprofen (ADVIL,MOTRIN) 200 MG tablet Take 400 mg by mouth every 6 (six) hours as needed for mild pain.     Meth-Hyo-M Bl-Na Phos-Ph Sal (URIBEL ) 118 MG CAPS Take 1 capsule (118 mg total) by mouth daily as needed (dysuria). 30 capsule 11   methocarbamol (ROBAXIN) 500 MG tablet Take 250 mg by mouth at bedtime as needed for muscle spasms.     mirtazapine  (REMERON ) 7.5 MG tablet Take 1 tablet (7.5 mg total) by mouth at bedtime. (Patient not taking: Reported on 01/08/2024) 30 tablet 1   pantoprazole (PROTONIX) 40 MG tablet Take 40 mg by mouth daily.     sildenafil  (REVATIO ) 20 MG tablet Take 1 tablet (20 mg total) by mouth daily as needed (TAKE 30 MINUTES PRIOR TO  SEXUAL ACTIVITY). 30 tablet 6   tadalafil  (CIALIS ) 10 MG tablet Take 1 tablet (10 mg total) by mouth daily as needed for erectile dysfunction. 30 tablet 11   No current facility-administered medications for this visit.     Musculoskeletal: Strength & Muscle Tone: N/A Gait & Station: N/A Patient leans: N/A  Psychiatric Specialty Exam: Review of Systems  Psychiatric/Behavioral: Negative.    All other systems reviewed and are negative.   There were no vitals taken for this visit.There is no height or weight on file to calculate BMI.  General Appearance: Well Groomed  Eye Contact:  Good  Speech:  Clear and Coherent  Volume:  Normal  Mood:  better  Affect:  Appropriate, Congruent, and Full Range  Thought Process:  Coherent  Orientation:  Full (Time, Place, and Person)  Thought Content: Logical   Suicidal Thoughts:  No  Homicidal Thoughts:  No  Memory:  Immediate;   Good  Judgement:  Good  Insight:  Good  Psychomotor Activity:  Normal  Concentration:  Concentration: Good and Attention Span: Good  Recall:  Good  Fund of Knowledge: Good  Language: Good  Akathisia:  No  Handed:  Right  AIMS (if indicated): not done  Assets:  Communication Skills Desire for Improvement  ADL's:  Intact  Cognition: WNL  Sleep:  Good   Screenings: Mini-Mental    Flowsheet Row Office Visit from 05/19/2022 in [facility]  Total Score (max 30 points ) 27   PHQ2-9    Flowsheet Row Counselor from 06/09/2022 in [facility] [place] Counselor from 09/18/2021 in [facility] Office Visit from 08/19/2021 in [facility]  PHQ-2 Total Score 1 4 4   PHQ-9 Total Score -- 12 14   Flowsheet Row Admission (Discharged) from 10/07/2022 in [facility] Counselor from 09/09/2022 in [facility]  from 08/21/2022 in  [facility]  C-SSRS RISK CATEGORY Error: Question 6 not populated No Risk No Risk     Assessment and Plan:  ELIAZER HEMPHILL is a 68 y.o. year old male with a history of depression, sleep apnea on CPAP, arthritis, GERD, pancreatic neuroendocrine tumor s/p excision, who presents for follow up appointment for below.   1. MDD (major depressive disorder), recurrent, in partial remission (HCC) Acute stressors include: gait imbalances,  Other stressors include:   mother in law moved to a facility in [place] History: had more than 2 episodes of depression in the past, relapse in mood symptoms when tapering down bupropion     Although there has been improvement in anhedonia, fatigue since uptitration of mirtazapine , he had adverse reaction of increase in appetite, binge eating and weight gain due to this.  After having discussed treatment options, he agrees to try lowering the dose to see how we will change his mood/weight.  It is noted that while TMS referral was discussed, he is not interested in this at this time given his mood has been good.  His blood pressure tends to be on the higher range, which precludes the use of SNRI.  He agrees to monitor his blood pressure until the next visit.  Will continue current dose of Lexapro  and bupropion  to target depression.    # Fatigue, unspecified type - uses CPAP machine. - TSH, vitamin D , ferritin wnl 12/2023 Improving. Labs results were reviewed.  He agrees to try the above intervention.    # cognitive impairment - he was seen by neurologist. MMSE 27/30 per report 09/2022 Unchanged  He reports occasional difficulty in remembering names of people.  IADL independent.  Noted that he was evaluated by his neurologist symptoms in 2021 with brain MRI, and labs, which are unremarkable to explain his symptoms.  Although he was recommended to obtain labs, he would like to defer this to his pcp visit.    Plan Continue bupropion  300 mg daily  (felt zombie at 450 mg)  Continue escitalopram  20 mg daily (QTc 412 msec in 04/2021, HR 52 sinus bradycardia)  Decrease mirtazapine  7.5 mg at night - monitor appetite- he will call if he needs a refill Next appointment: 9/19 at 8 am,  video [email or web address]   The patient demonstrates the following risk factors for suicide: Chronic risk factors for suicide include: psychiatric disorder of depression . Acute risk factors for suicide include: family or marital conflict. Protective factors for this patient include: hope for the future and religious beliefs against suicide. Considering these factors, the overall suicide risk at this point appears to be low. Patient is appropriate for outpatient follow up.   Past trials- lexapro , bupropion , mirtazapine   Collaboration of Care: Collaboration of Care: Other reviewed notes in Epic  Patient/Guardian was advised Release of Information must be obtained prior to any record release in order to collaborate their care with an outside provider. Patient/Guardian was advised if they have not already done so to contact the registration department to sign all necessary forms in order for us  to release information regarding their care.   Consent: Patient/Guardian gives verbal consent for treatment and assignment of benefits for services provided during this visit. Patient/Guardian expressed understanding and agreed to proceed.    Katheren Sleet, MD 02/19/2024, 9:34 AM

## 2024-02-14 DIAGNOSIS — G4733 Obstructive sleep apnea (adult) (pediatric): Secondary | ICD-10-CM | POA: Diagnosis not present

## 2024-02-19 ENCOUNTER — Encounter: Payer: Self-pay | Admitting: Psychiatry

## 2024-02-19 ENCOUNTER — Telehealth: Admitting: Psychiatry

## 2024-02-19 DIAGNOSIS — F3341 Major depressive disorder, recurrent, in partial remission: Secondary | ICD-10-CM | POA: Diagnosis not present

## 2024-02-19 NOTE — Patient Instructions (Addendum)
 Continue bupropion  300 mg daily   Continue escitalopram  20 mg daily Decrease mirtazapine  7.5 mg at night  Next appointment: 9/19 at 8 am,

## 2024-03-08 ENCOUNTER — Other Ambulatory Visit: Payer: Self-pay | Admitting: Psychiatry

## 2024-03-24 ENCOUNTER — Encounter: Payer: Self-pay | Admitting: Urology

## 2024-03-31 ENCOUNTER — Telehealth: Payer: Self-pay | Admitting: *Deleted

## 2024-03-31 NOTE — Telephone Encounter (Signed)
 He was diagnosed with neuroendocrine tumor and he states he gets a CT of chest/abdomen each year with [facility] and is also followed by cardiothoracic.  He did participate in LDCT with [facility] but he states he does not need LDCT now due to his care at [facility].  Wanted to inform us  for future reference.  If he needs our services in the future he will call.

## 2024-03-31 NOTE — Telephone Encounter (Signed)
 Pt called in and left VM regarding lung cancer screening. Attempted to contact pt but had to leave a VM for patient to return the call.

## 2024-03-31 NOTE — Telephone Encounter (Signed)
 Spoke with patient by phone. He used to participate in LDCT but recently had

## 2024-04-03 NOTE — Progress Notes (Signed)
 Virtual Visit via Video Note  I connected with Jared [name] on 04/08/24 at  8:00 AM EDT by a video enabled telemedicine application and verified that I am speaking with the correct person using two identifiers.  Location: Patient: home Provider: home office Persons participated in the visit- patient, provider    I discussed the limitations of evaluation and management by telemedicine and the availability of in person appointments. The patient expressed understanding and agreed to proceed.    I discussed the assessment and treatment plan with the patient. The patient was provided an opportunity to ask questions and all were answered. The patient agreed with the plan and demonstrated an understanding of the instructions.   The patient was advised to call back or seek an in-person evaluation if the symptoms worsen or if the condition fails to improve as anticipated.  Katheren Sleet, MD    [facility] MD/PA/NP OP Progress Note  04/08/2024 8:22 AM Jared [name]  MRN:  [id number]  Chief Complaint:  Chief Complaint  Patient presents with   Follow-up   HPI:  This is a follow-up appointment for depression.  He states that he continues to have his appetite intensified while being on the lower dose of mirtazapine .  He feels static.  There was a time he was grouchy, short tempered.  He continues to enjoy helping the company, and does volunteer work.  He had some falls in the yard when he lost balance.  He also felt clumsy with his hands, although he denies any tremors.  He sleeps fair.  He denies any drowsiness in the morning.  He denies feeling depressed or anxiety.  He denies SI, HI, hallucinations.  He believes his appetite is more of the control thing, and would like to stay on the current medication regimen at this time.  He agrees with the plans as outlined below.   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 MDD (major depressive disorder), recurrent, in partial remission (HCC)  F33.41       Past Psychiatric History: Please see initial evaluation for full details. I have reviewed the history. No updates at this time.     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   CHOLECYSTECTOMY     added umbilical mesh  COLONOSCOPY WITH PROPOFOL  N/A 03/27/2017   Procedure: COLONOSCOPY WITH PROPOFOL ;  Surgeon: Viktoria Lamar DASEN, MD;  Location: [facility] ENDOSCOPY;  Service: Endoscopy;  Laterality: N/A;   COLONOSCOPY WITH PROPOFOL  N/A 10/07/2022   Procedure: COLONOSCOPY WITH PROPOFOL ;  Surgeon: Maryruth Ole DASEN, MD;  Location: [facility] ENDOSCOPY;  Service: Endoscopy;  Laterality: N/A;   ESOPHAGOGASTRODUODENOSCOPY (EGD) WITH PROPOFOL  N/A 03/27/2017   Procedure: ESOPHAGOGASTRODUODENOSCOPY (EGD) WITH PROPOFOL ;  Surgeon: Viktoria Lamar DASEN, MD;  Location: [facility] ENDOSCOPY;  Service: Endoscopy;  Laterality: N/A;   hip fracture with rod placement Right    MASS EXCISION Left 05/24/2021   Procedure: EXCISION MASS;  Surgeon: Tye Millet, DO;  Location:  [facility] ORS;  Service: General;  Laterality: Left;  Prone postition    Family Psychiatric History: Please see initial evaluation for full details. I have reviewed the history. No updates at this time.     Family History:  Family History  Problem Relation Age of Onset   Cancer Mother    Depression Mother    Stroke Father    Depression Father    Aneurysm Father    Depression Sister    Depression Sister    Cancer Brother    Depression Brother     Social History:  Social History   Socioeconomic History   Marital status: Married    Spouse name: Not on file   Number of children: 2   Years of education: Not on file   Highest education level: Associate degree: occupational, Scientist, product/process development, or vocational program  Occupational History   Not on file  Tobacco Use   Smoking status: Former    Current packs/day: 0.00    Average packs/day: 1 pack/day for 45.0 years (45.0 ttl pk-yrs)    Types: Cigarettes    Start date: 01/29/1974    Quit date: 01/30/2019    Years since quitting: 5.1    Passive exposure: Past   Smokeless tobacco: Never  Vaping Use   Vaping status: Former  Substance and Sexual Activity   Alcohol use: No   Drug use: No   Sexual activity: Yes    Birth control/protection: None  Other Topics Concern   Not on file  Social History Narrative   Lives with wife   Social Drivers of Health   Financial Resource Strain: Low Risk  (04/08/2023)   Received from [facility]   Overall Financial Resource Strain (CARDIA)    Difficulty of Paying Living Expenses: Not hard at all  Food Insecurity: No Food Insecurity (04/08/2023)   Received from [facility]   Hunger Vital Sign    Within the past 12 months, you worried that your food would run out before you got the money to buy more.: Never true    Within the past 12 months, the food you bought just didn't last and you didn't have money to get more.: Never true  Transportation Needs: No Transportation  Needs (04/08/2023)   Received from [facility] - Transportation    In the past 12 months, has lack of transportation kept you from medical appointments or from getting medications?: No    Lack of Transportation (Non-Medical): No  Physical Activity: Not on file  Stress: Not on file  Social Connections: Not on file    Allergies: No Known Allergies  Metabolic Disorder Labs: Lab Results  Component Value Date   HGBA1C 6.2 (H) 05/19/2022   No results found for: PROLACTIN No results found for: CHOL, TRIG, HDL,  CHOLHDL, VLDL, LDLCALC Lab Results  Component Value Date   TSH 0.848 01/05/2024   TSH 0.850 05/19/2022    Therapeutic Level Labs: No results found for: LITHIUM No results found for: VALPROATE No results found for: CBMZ  Current Medications: Current Outpatient Medications  Medication Sig Dispense Refill   buPROPion  (WELLBUTRIN  XL) 300 MG 24 hr tablet Take 1 tablet (300 mg total) by mouth daily. 30 tablet 5   clotrimazole -betamethasone  (LOTRISONE ) cream Apply 1 Application topically 2 (two) times daily. 30 g 0   escitalopram  (LEXAPRO ) 20 MG tablet Take 1 tablet (20 mg total) by mouth daily. 30 tablet 5   ibuprofen (ADVIL,MOTRIN) 200 MG tablet Take 400 mg by mouth every 6 (six) hours as needed for mild pain.     Meth-Hyo-M Bl-Na Phos-Ph Sal (URIBEL ) 118 MG CAPS Take 1 capsule (118 mg total) by mouth daily as needed (dysuria). 30 capsule 11   methocarbamol (ROBAXIN) 500 MG tablet Take 250 mg by mouth at bedtime as needed for muscle spasms.     mirtazapine  (REMERON ) 7.5 MG tablet Take 1 tablet (7.5 mg total) by mouth at bedtime. (Patient not taking: Reported on 01/08/2024) 30 tablet 1   mirtazapine  (REMERON ) 7.5 MG tablet Take 1 tablet (7.5 mg total) by mouth at bedtime. 30 tablet 1   pantoprazole (PROTONIX) 40 MG tablet Take 40 mg by mouth daily.     sildenafil  (REVATIO ) 20 MG tablet Take 1 tablet (20 mg total) by mouth daily as  needed (TAKE 30 MINUTES PRIOR TO SEXUAL ACTIVITY). 30 tablet 6   tadalafil  (CIALIS ) 10 MG tablet Take 1 tablet (10 mg total) by mouth daily as needed for erectile dysfunction. 30 tablet 11   No current facility-administered medications for this visit.     Musculoskeletal: Strength & Muscle Tone: N/A Gait & Station: N/A Patient leans: N/A  Psychiatric Specialty Exam: Review of Systems  Psychiatric/Behavioral: Negative.    All other systems reviewed and are negative.   There were no vitals taken for this visit.There is no height or weight on file to calculate BMI.  General Appearance: Well Groomed  Eye Contact:  Good  Speech:  Clear and Coherent  Volume:  Normal  Mood:  static  Affect:  Appropriate, Congruent, and calm  Thought Process:  Coherent  Orientation:  Full (Time, Place, and Person)  Thought Content: Logical   Suicidal Thoughts:  No  Homicidal Thoughts:  No  Memory:  Immediate;   Good  Judgement:  Good  Insight:  Good  Psychomotor Activity:  Normal  Concentration:  Concentration: Good and Attention Span: Good  Recall:  Good  Fund of Knowledge: Good  Language: Good  Akathisia:  No  Handed:  Right  AIMS (if indicated): not done  Assets:  Communication Skills Desire for Improvement  ADL's:  Intact  Cognition: WNL  Sleep:  Fair   Screenings: Mini-Mental    Flowsheet Row Office Visit from 05/19/2022 in [facility]  Total Score (max 30 points ) 27   PHQ2-9    Flowsheet Row Counselor from 06/09/2022 in [facility] [place] Counselor from 09/18/2021 in [facility] Office Visit from 08/19/2021 in [facility]  PHQ-2 Total Score 1 4 4   PHQ-9 Total Score -- 12 14   Flowsheet Row Admission (Discharged) from 10/07/2022 in [facility] Counselor from 09/09/2022 in [facility] from 08/21/2022  in [facility]  C-SSRS RISK CATEGORY Error: Question 6 not populated No Risk No Risk     Assessment and Plan:  ZAMARI VEA is a 68 y.o. year old male with a history of depression, sleep apnea on CPAP, arthritis, GERD, pancreatic neuroendocrine tumor s/p excision, who presents for follow up appointment for below.   1. MDD (major depressive disorder), recurrent, in partial remission (HCC) Acute stressors include: gait imbalances,  Other stressors include:   mother in law moved to a facility in [place] History: had more than 2 episodes of depression in the past, relapse in mood symptoms when tapering down bupropion      He continues to experience mild anhedonia like symptoms, and has adverse reaction of increase in appetite even after lowering the dose of mirtazapine .  After having discussed treatment options of consideration to discontinue this medication, he feels comfortable to stay on the current medication regimen until the next visit.  Noted that TMS referral was discussed at the previous visit, while he declined as he did not have a concern about his mood symptoms.  He has hypertension, which precludes use of SNRI at this time.  Will continue current dose of Lexapro , bupropion , mirtazapine  to target depression.   # history of fall He reports history of fall, and reports some clumsiness.  He denies any drowsiness from mirtazapine .  He agrees to have in person visit for the next appointment.  He is also recommended to contact his PCP for any worsening in his symptoms.   # Fatigue, unspecified type - uses CPAP machine. - TSH, vitamin D , ferritin wnl 12/2023 Improving. Labs results were reviewed.  He agrees to try the above intervention.    # cognitive impairment - he was seen by neurologist. MMSE 27/30 per report 09/2022 Unchanged  He reports occasional difficulty in remembering names of people.  IADL  independent.  Noted that he was evaluated by his neurologist symptoms in 2021 with brain MRI, and labs, which are unremarkable to explain his symptoms.  Although he was recommended to obtain labs, he would like to defer this to his pcp visit.   Plan Continue bupropion  300 mg daily  (felt zombie at 450 mg)  Continue escitalopram  20 mg daily (QTc 412 msec in 04/2021, HR 52 sinus bradycardia)  Continue  mirtazapine  7.5 mg at night - monitor appetite- he will call if he needs a refill Next appointment: 11/13 at 10 am, IP [email or web address]   The patient demonstrates the following risk factors for suicide: Chronic risk factors for suicide include: psychiatric disorder of depression . Acute risk factors for suicide include: family or marital conflict. Protective factors for this patient include: hope for the future and religious beliefs against suicide. Considering these factors, the overall suicide risk at this point appears to be low. Patient is appropriate for outpatient follow up.     Collaboration of Care: Collaboration of Care: Other reviewed notes in Epic  Patient/Guardian was advised Release of Information must be obtained prior to any record release in order to collaborate their care with an outside provider. Patient/Guardian was advised if they have not already done so to contact the registration department to sign all necessary forms in order for us  to release information regarding their care.   Consent: Patient/Guardian gives verbal consent for treatment and assignment of benefits for services provided during this visit. Patient/Guardian expressed understanding and agreed to proceed.    Katheren Sleet, MD 04/08/2024, 8:22 AM

## 2024-04-08 ENCOUNTER — Telehealth (INDEPENDENT_AMBULATORY_CARE_PROVIDER_SITE_OTHER): Admitting: Psychiatry

## 2024-04-08 ENCOUNTER — Encounter: Payer: Self-pay | Admitting: Psychiatry

## 2024-04-08 DIAGNOSIS — F3341 Major depressive disorder, recurrent, in partial remission: Secondary | ICD-10-CM | POA: Diagnosis not present

## 2024-04-08 MED ORDER — BUPROPION HCL ER (XL) 300 MG PO TB24: 300.0000 mg | ORAL_TABLET | Freq: Every day | ORAL | 5 refills | Status: AC

## 2024-04-08 MED ORDER — MIRTAZAPINE 7.5 MG PO TABS: 7.5000 mg | ORAL_TABLET | Freq: Every day | ORAL | 1 refills | Status: DC

## 2024-04-08 NOTE — Patient Instructions (Signed)
 Continue bupropion  300 mg daily  Continue escitalopram  20 mg daily  Continue  mirtazapine  7.5 mg at night  Next appointment: 11/13 at 10 am

## 2024-04-09 ENCOUNTER — Other Ambulatory Visit: Payer: Self-pay | Admitting: Psychiatry

## 2024-04-12 ENCOUNTER — Other Ambulatory Visit: Payer: Self-pay

## 2024-04-12 ENCOUNTER — Telehealth: Payer: Self-pay

## 2024-04-12 DIAGNOSIS — Z122 Encounter for screening for malignant neoplasm of respiratory organs: Secondary | ICD-10-CM

## 2024-04-12 DIAGNOSIS — Z87891 Personal history of nicotine dependence: Secondary | ICD-10-CM

## 2024-04-12 NOTE — Telephone Encounter (Signed)
 Lung Cancer Screening Narrative/Criteria Questionnaire (Cigarette Smokers Only- No Cigars/Pipes/vapes)   Jared [name]   SDMV: 04/18/2024 at 10:15 am with Laneta  01-25-1956               LDCT: 04/20/2024 at 9:00 am at [facility]    68 y.o.   Phone: [phone]  Lung Screening Narrative (confirm age 31-77 yrs Medicare / 50-80 yrs Private pay insurance)   Insurance information: Medicare and BCBS   Referring Provider: Glover, MD   This screening involves an initial phone call with a team member from our program. It is called a shared decision making visit. The initial meeting is required by  insurance and Medicare to make sure you understand the program. This appointment takes about 15-20 minutes to complete. You will complete the screening scan at your scheduled date/time.  This scan takes about 5-10 minutes to complete. You can eat and drink normally before and after the scan.  Criteria questions for Lung Cancer Screening:   Are you a current or former smoker? Former Age began smoking: 18   If you are a former smoker, what year did you quit smoking? Quit 2020 (within 15 yrs)   To calculate your smoking history, I need an accurate estimate of how many packs of cigarettes you smoked per day and for how many years. (Not just the number of PPD you are now smoking)   Years smoking 45 x Packs per day 1.5 = Pack years 67.5   (at least 20 pack yrs)   (Make sure they understand that we need to know how much they have smoked in the past, not just the number of PPD they are smoking now)  Do you have a personal history of cancer?  Yes - (type and when diagnosed - 5 yrs cancer free) 2009    Do you have a family history of cancer? Mother breast cancer. Father with esophageal cancer. Sister with breast cancer.   Are you coughing up blood?  No  Have you had unexplained weight loss of 15 lbs or more in the last 6 months? No  It looks like you meet all criteria.  When would be a good time for us  to  schedule you for this screening?   Additional information: N/A

## 2024-04-18 ENCOUNTER — Ambulatory Visit: Admitting: Adult Health

## 2024-04-18 ENCOUNTER — Encounter: Payer: Self-pay | Admitting: Adult Health

## 2024-04-18 DIAGNOSIS — Z87891 Personal history of nicotine dependence: Secondary | ICD-10-CM | POA: Diagnosis not present

## 2024-04-18 NOTE — Progress Notes (Signed)
  Virtual Visit via Telephone Note  I connected with Jared [name] , 04/18/24 10:21 AM by a telemedicine application and verified that I am speaking with the correct person using two identifiers.  Location: Patient: home Provider: home   I discussed the limitations of evaluation and management by telemedicine and the availability of in person appointments. The patient expressed understanding and agreed to proceed.   Shared Decision Making Visit Lung Cancer Screening Program [phone])   Eligibility: 68 y.o. Pack Years Smoking History Calculation =67.5 (# packs/per year x # years smoked) Recent History of coughing up blood  no Unexplained weight loss? no ( >Than 15 pounds within the last 6 months ) Prior History Lung / other cancer no (Diagnosis within the last 5 years already requiring surveillance chest CT Scans). Smoking Status Former Smoker Former Smokers: Years since quit: 5 years  Quit Date: 2020  Visit Components: Discussion included one or more decision making aids. YES Discussion included risk/benefits of screening. YES Discussion included potential follow up diagnostic testing for abnormal scans. YES Discussion included meaning and risk of over diagnosis. YES Discussion included meaning and risk of False Positives. YES Discussion included meaning of total radiation exposure. YES  Counseling Included: Importance of adherence to annual lung cancer LDCT screening. YES Impact of comorbidities on ability to participate in the program. YES Ability and willingness to under diagnostic treatment. YES  Smoking Cessation Counseling: Former Smokers:  Discussed the importance of maintaining cigarette abstinence. yes Diagnosis Code: Personal History of Nicotine Dependence. S12.108 Information about tobacco cessation classes and interventions provided to patient. Yes Patient provided with ticket for LDCT Scan. yes Written Order for Lung Cancer Screening with LDCT placed in  Epic. Yes (CT Chest Lung Cancer Screening Low Dose W/O CM) [id number]  Z12.2-Screening of respiratory organs Z87.891-Personal history of nicotine dependence   [name] 04/18/24

## 2024-04-18 NOTE — Patient Instructions (Signed)

## 2024-04-20 ENCOUNTER — Ambulatory Visit
Admission: RE | Admit: 2024-04-20 | Discharge: 2024-04-20 | Disposition: A | Discharge status: Home / Self Care | Source: Ambulatory Visit | Attending: Acute Care | Admitting: Acute Care

## 2024-04-20 DIAGNOSIS — Z87891 Personal history of nicotine dependence: Secondary | ICD-10-CM | POA: Insufficient documentation

## 2024-04-20 DIAGNOSIS — Z122 Encounter for screening for malignant neoplasm of respiratory organs: Secondary | ICD-10-CM | POA: Diagnosis present

## 2024-04-25 ENCOUNTER — Other Ambulatory Visit: Payer: Self-pay | Admitting: Acute Care

## 2024-04-25 DIAGNOSIS — Z87891 Personal history of nicotine dependence: Secondary | ICD-10-CM

## 2024-04-25 DIAGNOSIS — Z122 Encounter for screening for malignant neoplasm of respiratory organs: Secondary | ICD-10-CM

## 2024-05-29 NOTE — Progress Notes (Unsigned)
 BH MD/PA/NP OP Progress Note  05/29/2024 11:58 AM Jared [name]  MRN:  [id number]  Chief Complaint: No chief complaint on file.  HPI: *** Chart reviewed, he was seen by GI for GERD.    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  Past Psychiatric History: Please see initial evaluation for full details. I have reviewed the history. No updates at this time.     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   CHOLECYSTECTOMY     added umbilical mesh   COLONOSCOPY WITH PROPOFOL  N/A 03/27/2017   Procedure: COLONOSCOPY WITH PROPOFOL ;  Surgeon: Viktoria Lamar DASEN, MD;  Location: [facility] ENDOSCOPY;  Service: Endoscopy;  Laterality: N/A;   COLONOSCOPY WITH PROPOFOL  N/A 10/07/2022   Procedure: COLONOSCOPY WITH PROPOFOL ;  Surgeon: Maryruth Ole DASEN, MD;  Location: [facility] ENDOSCOPY;  Service: Endoscopy;  Laterality: N/A;   ESOPHAGOGASTRODUODENOSCOPY (EGD) WITH PROPOFOL  N/A 03/27/2017   Procedure: ESOPHAGOGASTRODUODENOSCOPY  (EGD) WITH PROPOFOL ;  Surgeon: Viktoria Lamar DASEN, MD;  Location: [facility] ENDOSCOPY;  Service: Endoscopy;  Laterality: N/A;   hip fracture with rod placement Right    MASS EXCISION Left 05/24/2021   Procedure: EXCISION MASS;  Surgeon: Tye Millet, DO;  Location: [facility] ORS;  Service: General;  Laterality: Left;  Prone postition    Family Psychiatric History: Please see initial evaluation for full details. I have reviewed the history. No updates at this time.     Family History:  Family History  Problem Relation Age of Onset   Cancer Mother    Depression Mother    Stroke Father    Depression Father    Aneurysm Father    Depression Sister    Depression Sister    Cancer Brother    Depression Brother     Social History:  Social History   Socioeconomic History   Marital status: Married    Spouse name: Not on file   Number of children: 2   Years of education: Not on file   Highest education level: Associate degree: occupational, scientist, product/process development, or vocational program  Occupational History   Not on file  Tobacco Use   Smoking status: Former    Current packs/day: 0.00    Average packs/day: 1 pack/day for 45.0 years (45.0 ttl pk-yrs)    Types:  Cigarettes    Start date: 01/29/1974    Quit date: 01/30/2019    Years since quitting: 5.3    Passive exposure: Past   Smokeless tobacco: Never  Vaping Use   Vaping status: Former  Substance and Sexual Activity   Alcohol use: No   Drug use: No   Sexual activity: Yes    Birth control/protection: None  Other Topics Concern   Not on file  Social History Narrative   Lives with wife   Social Drivers of Health   Financial Resource Strain: Low Risk  (05/17/2024)   Received from [facility]   Overall Financial Resource Strain (CARDIA)    Difficulty of Paying Living Expenses: Not hard at all  Food Insecurity: No Food Insecurity (05/17/2024)   Received from [facility]   Hunger Vital Sign    Within the past 12  months, you worried that your food would run out before you got the money to buy more.: Never true    Within the past 12 months, the food you bought just didn't last and you didn't have money to get more.: Never true  Transportation Needs: No Transportation Needs (05/17/2024)   [facility] - Transportation    In the past 12 months, has lack of transportation kept you from medical appointments or from getting medications?: No    Lack of Transportation (Non-Medical): No  Physical Activity: Not on file  Stress: Not on file  Social Connections: Not on file    Allergies: No Known Allergies  Metabolic Disorder Labs: Lab Results  Component Value Date   HGBA1C 6.2 (H) 05/19/2022   No results found for: PROLACTIN No results found for: CHOL, TRIG, HDL, CHOLHDL, VLDL, LDLCALC Lab Results  Component Value Date   TSH 0.848 01/05/2024   TSH 0.850 05/19/2022    Therapeutic Level Labs: No results found for: LITHIUM No results found for: VALPROATE No results found for: CBMZ  Current Medications: Current Outpatient Medications  Medication Sig Dispense Refill   buPROPion  (WELLBUTRIN  XL) 300 MG 24 hr tablet Take 1 tablet (300 mg total) by mouth daily. 30 tablet 5   clotrimazole -betamethasone  (LOTRISONE ) cream Apply 1 Application topically 2 (two) times daily. 30 g 0   escitalopram  (LEXAPRO ) 20 MG tablet Take 1 tablet (20 mg total) by mouth daily. 30 tablet 5   ibuprofen (ADVIL,MOTRIN) 200 MG tablet Take 400 mg by mouth every 6 (six) hours as needed for mild pain.     Meth-Hyo-M Bl-Na Phos-Ph Sal (URIBEL ) 118 MG CAPS Take 1 capsule (118 mg total) by mouth daily as needed (dysuria). 30 capsule 11   methocarbamol (ROBAXIN) 500 MG tablet Take 250 mg by mouth at bedtime as needed for muscle spasms.     mirtazapine  (REMERON ) 7.5 MG tablet Take 1 tablet (7.5 mg total) by mouth at bedtime. (Patient not taking: Reported on 01/08/2024) 30 tablet  1   mirtazapine  (REMERON ) 7.5 MG tablet Take 1 tablet (7.5 mg total) by mouth at bedtime. 30 tablet 1   pantoprazole (PROTONIX) 40 MG tablet Take 40 mg by mouth daily.     sildenafil  (REVATIO ) 20 MG tablet Take 1 tablet (20 mg total) by mouth daily as needed (TAKE 30 MINUTES PRIOR TO SEXUAL ACTIVITY). 30 tablet 6   tadalafil  (CIALIS ) 10 MG tablet Take 1 tablet (10 mg total) by mouth daily as needed for erectile dysfunction. 30 tablet 11   No current facility-administered medications for this  visit.     Musculoskeletal: Strength & Muscle Tone: within normal limits Gait & Station: normal Patient leans: N/A  Psychiatric Specialty Exam: Review of Systems  There were no vitals taken for this visit.There is no height or weight on file to calculate BMI.  General Appearance: {Appearance:22683}  Eye Contact:  {BHH EYE CONTACT:22684}  Speech:  Clear and Coherent  Volume:  Normal  Mood:  {BHH MOOD:22306}  Affect:  {Affect (PAA):22687}  Thought Process:  Coherent  Orientation:  Full (Time, Place, and Person)  Thought Content: Logical   Suicidal Thoughts:  {ST/HT (PAA):22692}  Homicidal Thoughts:  {ST/HT (PAA):22692}  Memory:  Immediate;   Good  Judgement:  {Judgement (PAA):22694}  Insight:  {Insight (PAA):22695}  Psychomotor Activity:  Normal  Concentration:  Concentration: Good and Attention Span: Good  Recall:  Good  Fund of Knowledge: Good  Language: Good  Akathisia:  No  Handed:  Right  AIMS (if indicated): not done  Assets:  Communication Skills Desire for Improvement  ADL's:  Intact  Cognition: WNL  Sleep:  {BHH GOOD/FAIR/POOR:22877}   Screenings: Mini-Mental    Flowsheet Row Office Visit from 05/19/2022 in [facility]  Total Score (max 30 points ) 27   PHQ2-9    Flowsheet Row Counselor from 06/09/2022 in [facility] [place] Counselor from 09/18/2021 in [facility]  Office Visit from 08/19/2021 in [facility]  PHQ-2 Total Score 1 4 4   PHQ-9 Total Score -- 12 14   Flowsheet Row Admission (Discharged) from 10/07/2022 in [facility] Counselor from 09/09/2022 in [facility] ED from 08/21/2022 in [facility]  C-SSRS RISK CATEGORY Error: Question 6 not populated No Risk No Risk     Assessment and Plan:  OSIRIS ODRISCOLL is a 68 y.o. year old male with a history of depression, sleep apnea on CPAP, arthritis, GERD, pancreatic neuroendocrine tumor s/p excision, who presents for follow up appointment for below.    1. MDD (major depressive disorder), recurrent, in partial remission (HCC) Acute stressors include: gait imbalances,  Other stressors include:   mother in law moved to a facility in [place] History: had more than 2 episodes of depression in the past, relapse in mood symptoms when tapering down bupropion      He continues to experience mild anhedonia like symptoms, and has adverse reaction of increase in appetite even after lowering the dose of mirtazapine .  After having discussed treatment options of consideration to discontinue this medication, he feels comfortable to stay on the current medication regimen until the next visit.  Noted that TMS referral was discussed at the previous visit, while he declined as he did not have a concern about his mood symptoms.  He has hypertension, which precludes use of SNRI at this time.  Will continue current dose of Lexapro , bupropion , mirtazapine  to target depression.    # history of fall He reports history of fall, and reports some clumsiness.  He denies any drowsiness from mirtazapine .  He agrees to have in person visit for the next appointment.  He is also recommended to contact his PCP for any worsening in his symptoms.    # Fatigue, unspecified type - uses CPAP  machine. - TSH, vitamin D , ferritin wnl 12/2023 Improving. Labs results were reviewed.  He agrees to try the above intervention.    # cognitive impairment -  he was seen by neurologist. MMSE 27/30 per report 09/2022 Unchanged  He reports occasional difficulty in remembering names of people.  IADL independent.  Noted that he was evaluated by his neurologist symptoms in 2021 with brain MRI, and labs, which are unremarkable to explain his symptoms.  Although he was recommended to obtain labs, he would like to defer this to his pcp visit.   Plan Continue bupropion  300 mg daily  (felt zombie at 450 mg)  Continue escitalopram  20 mg daily (QTc 412 msec in 04/2021, HR 52 sinus bradycardia)  Continue  mirtazapine  7.5 mg at night - monitor appetite- he will call if he needs a refill Next appointment: 11/13 at 10 am, IP [email or web address]   The patient demonstrates the following risk factors for suicide: Chronic risk factors for suicide include: psychiatric disorder of depression . Acute risk factors for suicide include: family or marital conflict. Protective factors for this patient include: hope for the future and religious beliefs against suicide. Considering these factors, the overall suicide risk at this point appears to be low. Patient is appropriate for outpatient follow up.       Collaboration of Care: Collaboration of Care: {BH OP Collaboration of Care:21014065}  Patient/Guardian was advised Release of Information must be obtained prior to any record release in order to collaborate their care with an outside provider. Patient/Guardian was advised if they have not already done so to contact the registration department to sign all necessary forms in order for us  to release information regarding their care.   Consent: Patient/Guardian gives verbal consent for treatment and assignment of benefits for services provided during this visit. Patient/Guardian expressed understanding and agreed to  proceed.    Katheren Sleet, MD 05/29/2024, 11:58 AM

## 2024-06-02 ENCOUNTER — Other Ambulatory Visit: Payer: Self-pay

## 2024-06-02 ENCOUNTER — Encounter: Payer: Self-pay | Admitting: Psychiatry

## 2024-06-02 ENCOUNTER — Ambulatory Visit: Admitting: Psychiatry

## 2024-06-02 VITALS — BP 138/86 | HR 60 | Temp 97.4°F | Ht 78.0 in | Wt 242.4 lb

## 2024-06-02 DIAGNOSIS — F330 Major depressive disorder, recurrent, mild: Secondary | ICD-10-CM | POA: Diagnosis not present

## 2024-06-02 MED ORDER — ESCITALOPRAM OXALATE 20 MG PO TABS: 20.0000 mg | ORAL_TABLET | Freq: Every day | ORAL | 5 refills | Status: AC

## 2024-06-02 NOTE — Patient Instructions (Addendum)
 Continue bupropion  300 mg daily   Continue escitalopram  20 mg daily  Discontinue mirtazapine  Next appointment: 12/30 at 4:30 Consider Transcranial magnetic stimulation - [facility]

## 2024-07-15 NOTE — Progress Notes (Signed)
 BH MD/PA/NP OP Progress Note  07/19/2024 6:39 PM Jared [name]  MRN:  [id number]  Chief Complaint:  Chief Complaint  Patient presents with   Follow-up   HPI:  This is a follow-up appointment for depression, cognitive change.  He states that he has been feeling better.  He feels even keeled and denies concern about energy since discontinuation of mirtazapine .  He does not have much hunger as he used to, and he denies eating much ice cream.  He has been sleeping well with melatonin 5 mg.  He enjoyed taking a walk with his wife prior to coming to the appointment.  Although he reports some concern about issues with his leg, he thinks his stamina is good.  He denies SI, HI, hallucinations.   Memory-he reports concern about his memory.  He asks about dementia, and what evaluation is available for Alzheimer disease.  Psychoeducation is provided at length and he expressed understanding of this.  He agrees with the plan as outlined below.    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 MDD (major depressive disorder), recurrent, in partial remission  F33.41     2. Cognitive change  R41.89       Past Psychiatric History: Please see initial evaluation for full details. I have reviewed the history. No updates at this time.     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   CHOLECYSTECTOMY     added umbilical mesh   COLONOSCOPY WITH PROPOFOL  N/A 03/27/2017   Procedure: COLONOSCOPY WITH PROPOFOL ;  Surgeon: Viktoria Lamar DASEN, MD;  Location: [facility] ENDOSCOPY;  Service: Endoscopy;  Laterality: N/A;   COLONOSCOPY WITH PROPOFOL  N/A 10/07/2022   Procedure: COLONOSCOPY WITH PROPOFOL ;  Surgeon: Maryruth Ole DASEN, MD;  Location: [facility] ENDOSCOPY;  Service: Endoscopy;  Laterality: N/A;   ESOPHAGOGASTRODUODENOSCOPY (EGD) WITH PROPOFOL  N/A 03/27/2017   Procedure: ESOPHAGOGASTRODUODENOSCOPY (EGD) WITH PROPOFOL ;  Surgeon: Viktoria Lamar DASEN, MD;  Location: [facility] ENDOSCOPY;  Service: Endoscopy;  Laterality: N/A;   hip fracture with rod placement Right    MASS EXCISION Left 05/24/2021   Procedure: EXCISION MASS;  Surgeon: Tye Millet, DO;  Location: [facility] ORS;  Service: General;  Laterality: Left;  Prone postition    Family Psychiatric History: Please see initial evaluation for full  details. I have reviewed the history. No updates at this time.     Family History:  Family History  Problem Relation Age of Onset   Cancer Mother    Depression Mother    Stroke Father    Depression Father    Aneurysm Father    Depression Sister    Depression Sister    Cancer Brother    Depression Brother     Social History:  Social History   Socioeconomic History   Marital status: Married    Spouse name: Not on file   Number of children: 2   Years of education: Not on file   Highest education level: Associate degree: occupational, scientist, product/process development, or vocational program  Occupational History   Not on file  Tobacco Use   Smoking status: Former    Current  packs/day: 0.00    Average packs/day: 1 pack/day for 45.0 years (45.0 ttl pk-yrs)    Types: Cigarettes    Start date: 01/29/1974    Quit date: 01/30/2019    Years since quitting: 5.4    Passive exposure: Past   Smokeless tobacco: Never  Vaping Use   Vaping status: Former  Substance and Sexual Activity   Alcohol use: No   Drug use: No   Sexual activity: Yes    Birth control/protection: None  Other Topics Concern   Not on file  Social History Narrative   Lives with wife   Social Drivers of Health   Tobacco Use: Medium Risk (07/19/2024)   Patient History    Smoking Tobacco Use: Former    Smokeless Tobacco Use: Never    Passive Exposure: Past  Physicist, Medical Strain: Low Risk  (05/17/2024)   Received from [facility]   Overall Financial Resource Strain (CARDIA)    Difficulty of Paying Living Expenses: Not hard at all  Food Insecurity: No Food Insecurity (05/17/2024)   Received from [facility]   Epic    Within the past 12 months, you worried that your food would run out before you got the money to buy more.: Never true    Within the past 12 months, the food you bought just didn't last and you didn't have money to get more.: Never true  Transportation Needs: No Transportation Needs (05/17/2024)   Received [facility]   PRAPARE - Transportation    Lack of Transportation (Non-Medical): No    In the past 12 months, has lack of transportation kept you from medical appointments or from getting medications?: No  Physical Activity: Not on file  Stress: Not on file  Social Connections: Not on file  Depression (PHQ2-9): Medium Risk (06/02/2024)   Depression (PHQ2-9)    PHQ-2 Score: 7  Alcohol Screen: Not on file  Housing: Low Risk  (05/17/2024)   Received from [facility]   Epic    At any time in the past 12 months, were you homeless or living in a shelter (including now)?: No    In the past 12 months,  how many times have you moved where you were living?: 0    In the last 12 months, was there a time when you were not able to pay the mortgage or rent on time?: No  Utilities: Not At Risk (05/17/2024)   Received from [facility]   Epic    In the past 12 months has the electric, gas, oil, or water company threatened to shut off services in your  home?: No  Health Literacy: Not on file    Allergies: Allergies[1]  Metabolic Disorder Labs: Lab Results  Component Value Date   HGBA1C 6.2 (H) 05/19/2022   No results found for: PROLACTIN No results found for: CHOL, TRIG, HDL, CHOLHDL, VLDL, LDLCALC Lab Results  Component Value Date   TSH 0.848 01/05/2024   TSH 0.850 05/19/2022    Therapeutic Level Labs: No results found for: LITHIUM No results found for: VALPROATE No results found for: CBMZ  Current Medications: Current Outpatient Medications  Medication Sig Dispense Refill   buPROPion  (WELLBUTRIN  XL) 300 MG 24 hr tablet Take 1 tablet (300 mg total) by mouth daily. 30 tablet 5   clotrimazole -betamethasone  (LOTRISONE ) cream Apply 1 Application topically 2 (two) times daily. 30 g 0   escitalopram  (LEXAPRO ) 20 MG tablet Take 1 tablet (20 mg total) by mouth daily. 30 tablet 5   ibuprofen (ADVIL,MOTRIN) 200 MG tablet Take 400 mg by mouth every 6 (six) hours as needed for mild pain.     Meth-Hyo-M Bl-Na Phos-Ph Sal (URIBEL ) 118 MG CAPS Take 1 capsule (118 mg total) by mouth daily as needed (dysuria). 30 capsule 11   methocarbamol (ROBAXIN) 500 MG tablet Take 250 mg by mouth at bedtime as needed for muscle spasms.     pantoprazole (PROTONIX) 40 MG tablet Take 40 mg by mouth daily.     sildenafil  (REVATIO ) 20 MG tablet Take 1 tablet (20 mg total) by mouth daily as needed (TAKE 30 MINUTES PRIOR TO SEXUAL ACTIVITY). 30 tablet 6   tadalafil  (CIALIS ) 10 MG tablet Take 1 tablet (10 mg total) by mouth daily as needed for erectile dysfunction. 30 tablet 11   No  current facility-administered medications for this visit.     Musculoskeletal: Strength & Muscle Tone: within normal limits Gait & Station: normal Patient leans: N/A  Psychiatric Specialty Exam: Review of Systems  Psychiatric/Behavioral:  Negative for agitation, behavioral problems, confusion, decreased concentration, dysphoric mood, hallucinations, self-injury, sleep disturbance and suicidal ideas. The patient is not nervous/anxious and is not hyperactive.   All other systems reviewed and are negative.   Blood pressure 132/82, pulse 60, temperature 98.6 F (37 C), temperature source Temporal, height 6' 6 (1.981 m), weight 236 lb 3.2 oz (107.1 kg).Body mass index is 27.3 kg/m.  General Appearance: Well Groomed  Eye Contact:  Good  Speech:  Clear and Coherent  Volume:  Normal  Mood:  good  Affect:  Appropriate, Congruent, and calm  Thought Process:  Coherent  Orientation:  Full (Time, Place, and Person)  Thought Content: Logical   Suicidal Thoughts:  No  Homicidal Thoughts:  No  Memory:  Immediate;   Good  Judgement:  Good  Insight:  Good  Psychomotor Activity:  Normal  Concentration:  Concentration: Good and Attention Span: Good  Recall:  Good  Fund of Knowledge: Good  Language: Good  Akathisia:  No  Handed:  Right  AIMS (if indicated): not done  Assets:  Communication Skills Desire for Improvement  ADL's:  Intact  Cognition: WNL  Sleep:  Good   Screenings: Mini-Mental    Flowsheet Row Office Visit from 05/19/2022 in [facility]  Total Score (max 30 points ) 27   PHQ2-9    Flowsheet Row Office Visit from 06/02/2024 in [facility] Counselor from 06/09/2022 in [facility] [place] Counselor from 09/18/2021 in [facility] Office Visit from 08/19/2021 in [facility]  PHQ-2 Total Score  2 1 4 4   PHQ-9 Total Score 7 -- 12 14   Flowsheet Row Office Visit from 06/02/2024 in [facility] Admission (Discharged) from 10/07/2022 in [facility] Counselor from 09/09/2022 in [facility] RISK CATEGORY Error: Q3, 4, or 5 should not be populated when Q2 is No Error: Question 6 not populated No Risk     Assessment and Plan:  Jared [name] is a 68 y.o. male with a history of depression, sleep apnea on CPAP, arthritis, GERD, pancreatic neuroendocrine tumor s/p excision, who presents for follow up appointment for below.   1. MDD (major depressive disorder), recurrent episode, in partial remission Acute stressors include: gait imbalances,  Other stressors include:   mother in law moved to a facility in [place] History: had more than 2 episodes of depression in the past, relapse in mood symptoms when tapering down bupropion        He reports overall improvement in depressive symptoms, and denies concern about anhedonia despite discontinuation of mirtazapine .  Will continue current dose of Lexapro , bupropion  to target depression.  The treatment option of TMS, or adjunctive treatment with antipsychotics has been discussed if any worsening in his mood symptoms in the future.    # Fatigue, unspecified type - uses CPAP machine. - TSH, vitamin D , ferritin wnl 12/2023 Improving. Labs results were reviewed.  He agrees to try the above intervention.    # cognitive change - he was seen by neurologist. MMSE 27/30 per report 09/2022 He continues to experience concern about memory.  IADL independent.  Noted that he was evaluated by his neurologist symptoms in 2021 with brain MRI, and labs, which are unremarkable to explain his symptoms.  He has a lot of questions about the diagnosis of dementia, alzheimer disorder.  Possible etiology, diagnostic testing has been discussed.  He agrees  to follow-up with his neurologist for re evaluation of cognition, and for those testing.   Plan Continue bupropion  300 mg daily  (felt zombie at 450 mg)  Continue escitalopram  20 mg daily (QTc 412 msec in 04/2021, HR 52 sinus bradycardia)  Next appointment: 2/23 at 4:30, IP [email or web address] He was advised to contact the clinic of Dr. Margaret Eduard SAUNDERS, MD for concerns related to cognitive impairment [facility] [address], [address] [place], [place] [address] [phone]   Past trials- lexapro , bupropion , mirtazapine  (weight gain)   The patient demonstrates the following risk factors for suicide: Chronic risk factors for suicide include: psychiatric disorder of depression . Acute risk factors for suicide include: family or marital conflict. Protective factors for this patient include: hope for the future and religious beliefs against suicide. Considering these factors, the overall suicide risk at this point appears to be low. Patient is appropriate for outpatient follow up.  Although he has guns, those are locked.  He agrees to contact emergency resources if any worsening in his symptoms.   A total of 30 minutes was spent on the following activities during the encounter date, which includes but is not limited to: preparing to see the patient (e.g., reviewing tests and records), obtaining and/or reviewing separately obtained history, performing a medically necessary examination or evaluation, counseling and educating the patient, family, or caregiver, ordering medications, tests, or procedures, referring and communicating with other healthcare professionals (when not reported separately), documenting clinical information in the electronic or paper health record,  independently interpreting test or lab results and communicating these results to the family or caregiver, and coordinating care (when not reported separately).   Collaboration of Care: Collaboration of Care:  Other reviewed notes in Epic  Patient/Guardian was advised Release of Information must be obtained prior to any record release in order to collaborate their care with an outside provider. Patient/Guardian was advised if they have not already done so to contact the registration department to sign all necessary forms in order for us  to release information regarding their care.   Consent: Patient/Guardian gives verbal consent for treatment and assignment of benefits for services provided during this visit. Patient/Guardian expressed understanding and agreed to proceed.    Katheren Sleet, MD 07/19/2024, 6:39 PM      [1] No Known Allergies

## 2024-07-19 ENCOUNTER — Other Ambulatory Visit: Payer: Self-pay

## 2024-07-19 ENCOUNTER — Encounter: Payer: Self-pay | Admitting: Psychiatry

## 2024-07-19 ENCOUNTER — Ambulatory Visit: Admitting: Psychiatry

## 2024-07-19 VITALS — BP 132/82 | HR 60 | Temp 98.6°F | Ht 78.0 in | Wt 236.2 lb

## 2024-07-19 DIAGNOSIS — F3341 Major depressive disorder, recurrent, in partial remission: Secondary | ICD-10-CM

## 2024-07-19 DIAGNOSIS — R4189 Other symptoms and signs involving cognitive functions and awareness: Secondary | ICD-10-CM | POA: Diagnosis not present

## 2024-07-19 NOTE — Patient Instructions (Addendum)
 Continue bupropion  300 mg daily   Continue escitalopram  20 mg daily Next appointment: 2/23 at 4:30 Consider contacting Dr. Margaret Eduard SAUNDERS, MD  [facility] [address], [address] [place], [place] [address] [phone]

## 2024-07-24 ENCOUNTER — Inpatient Hospital Stay

## 2024-07-24 ENCOUNTER — Encounter: Admission: EM | Disposition: A | Discharge status: Home Health | Payer: Self-pay | Source: Home / Self Care

## 2024-07-24 ENCOUNTER — Inpatient Hospital Stay: Admitting: Anesthesiology

## 2024-07-24 ENCOUNTER — Emergency Department

## 2024-07-24 ENCOUNTER — Inpatient Hospital Stay: Admission: EM | Admit: 2024-07-24 | Discharge: 2024-07-29 | DRG: 481 | Disposition: A | Discharge status: Home Health

## 2024-07-24 ENCOUNTER — Other Ambulatory Visit: Payer: Self-pay

## 2024-07-24 DIAGNOSIS — Z7982 Long term (current) use of aspirin: Secondary | ICD-10-CM | POA: Diagnosis not present

## 2024-07-24 DIAGNOSIS — Z87891 Personal history of nicotine dependence: Secondary | ICD-10-CM

## 2024-07-24 DIAGNOSIS — L03113 Cellulitis of right upper limb: Secondary | ICD-10-CM | POA: Diagnosis not present

## 2024-07-24 DIAGNOSIS — E785 Hyperlipidemia, unspecified: Secondary | ICD-10-CM | POA: Diagnosis present

## 2024-07-24 DIAGNOSIS — G2581 Restless legs syndrome: Secondary | ICD-10-CM | POA: Diagnosis present

## 2024-07-24 DIAGNOSIS — G459 Transient cerebral ischemic attack, unspecified: Secondary | ICD-10-CM | POA: Diagnosis present

## 2024-07-24 DIAGNOSIS — G629 Polyneuropathy, unspecified: Secondary | ICD-10-CM | POA: Diagnosis present

## 2024-07-24 DIAGNOSIS — K219 Gastro-esophageal reflux disease without esophagitis: Secondary | ICD-10-CM | POA: Diagnosis present

## 2024-07-24 DIAGNOSIS — S72002S Fracture of unspecified part of neck of left femur, sequela: Secondary | ICD-10-CM | POA: Diagnosis not present

## 2024-07-24 DIAGNOSIS — R710 Precipitous drop in hematocrit: Secondary | ICD-10-CM | POA: Diagnosis not present

## 2024-07-24 DIAGNOSIS — Z8507 Personal history of malignant neoplasm of pancreas: Secondary | ICD-10-CM

## 2024-07-24 DIAGNOSIS — Z818 Family history of other mental and behavioral disorders: Secondary | ICD-10-CM | POA: Diagnosis not present

## 2024-07-24 DIAGNOSIS — S72012A Unspecified intracapsular fracture of left femur, initial encounter for closed fracture: Principal | ICD-10-CM | POA: Diagnosis present

## 2024-07-24 DIAGNOSIS — G6289 Other specified polyneuropathies: Secondary | ICD-10-CM | POA: Diagnosis not present

## 2024-07-24 DIAGNOSIS — I951 Orthostatic hypotension: Secondary | ICD-10-CM | POA: Diagnosis present

## 2024-07-24 DIAGNOSIS — Z9049 Acquired absence of other specified parts of digestive tract: Secondary | ICD-10-CM | POA: Diagnosis not present

## 2024-07-24 DIAGNOSIS — S72002A Fracture of unspecified part of neck of left femur, initial encounter for closed fracture: Secondary | ICD-10-CM

## 2024-07-24 DIAGNOSIS — Z86718 Personal history of other venous thrombosis and embolism: Secondary | ICD-10-CM | POA: Diagnosis not present

## 2024-07-24 DIAGNOSIS — F419 Anxiety disorder, unspecified: Secondary | ICD-10-CM | POA: Diagnosis present

## 2024-07-24 DIAGNOSIS — F418 Other specified anxiety disorders: Secondary | ICD-10-CM | POA: Diagnosis not present

## 2024-07-24 DIAGNOSIS — Y92009 Unspecified place in unspecified non-institutional (private) residence as the place of occurrence of the external cause: Secondary | ICD-10-CM

## 2024-07-24 DIAGNOSIS — K5900 Constipation, unspecified: Secondary | ICD-10-CM | POA: Diagnosis present

## 2024-07-24 DIAGNOSIS — Z8673 Personal history of transient ischemic attack (TIA), and cerebral infarction without residual deficits: Secondary | ICD-10-CM

## 2024-07-24 DIAGNOSIS — W010XXA Fall on same level from slipping, tripping and stumbling without subsequent striking against object, initial encounter: Secondary | ICD-10-CM | POA: Diagnosis present

## 2024-07-24 DIAGNOSIS — Z79899 Other long term (current) drug therapy: Secondary | ICD-10-CM

## 2024-07-24 DIAGNOSIS — F32A Depression, unspecified: Secondary | ICD-10-CM | POA: Diagnosis present

## 2024-07-24 DIAGNOSIS — M25552 Pain in left hip: Principal | ICD-10-CM

## 2024-07-24 HISTORY — PX: INTRAMEDULLARY (IM) NAIL INTERTROCHANTERIC: SHX5875

## 2024-07-24 LAB — CBC
HCT: 42.1 % (ref 39.0–52.0)
HCT: 38.1 % — ABNORMAL LOW (ref 39.0–52.0)
Hemoglobin: 14.3 g/dL (ref 13.0–17.0)
Hemoglobin: 13 g/dL (ref 13.0–17.0)
MCH: 31.5 pg (ref 26.0–34.0)
MCH: 31.6 pg (ref 26.0–34.0)
MCHC: 34 g/dL (ref 30.0–36.0)
MCHC: 34.1 g/dL (ref 30.0–36.0)
MCV: 92.7 fL (ref 80.0–100.0)
MCV: 92.5 fL (ref 80.0–100.0)
Platelets: 257 K/uL (ref 150–400)
Platelets: 212 K/uL (ref 150–400)
RBC: 4.54 MIL/uL (ref 4.22–5.81)
RBC: 4.12 MIL/uL — ABNORMAL LOW (ref 4.22–5.81)
RDW: 15.2 % (ref 11.5–15.5)
RDW: 15 % (ref 11.5–15.5)
WBC: 6.4 K/uL (ref 4.0–10.5)
WBC: 12.8 K/uL — ABNORMAL HIGH (ref 4.0–10.5)
nRBC: 0 % (ref 0.0–0.2)
nRBC: 0 % (ref 0.0–0.2)

## 2024-07-24 LAB — BASIC METABOLIC PANEL WITH GFR
Anion gap: 12 (ref 5–15)
BUN: 16 mg/dL (ref 8–23)
CO2: 25 mmol/L (ref 22–32)
Calcium: 9.5 mg/dL (ref 8.9–10.3)
Chloride: 105 mmol/L (ref 98–111)
Creatinine, Ser: 1.08 mg/dL (ref 0.61–1.24)
GFR, Estimated: >60 mL/min
Glucose, Bld: 102 mg/dL — ABNORMAL HIGH (ref 70–99)
Potassium: 4.4 mmol/L (ref 3.5–5.1)
Sodium: 142 mmol/L (ref 135–145)

## 2024-07-24 LAB — CREATININE, SERUM
Creatinine, Ser: 1.02 mg/dL (ref 0.61–1.24)
GFR, Estimated: >60 mL/min

## 2024-07-24 LAB — PROTIME-INR
INR: 1 (ref 0.8–1.2)
Prothrombin Time: 14 s (ref 11.4–15.2)

## 2024-07-24 LAB — APTT: aPTT: 27 s (ref 24–36)

## 2024-07-24 MED ORDER — HYDROMORPHONE HCL 1 MG/ML IJ SOLN
INTRAMUSCULAR | Status: DC | PRN
  Administered 2024-07-24: 1 mg via INTRAVENOUS

## 2024-07-24 MED ORDER — ENOXAPARIN SODIUM 40 MG/0.4ML IJ SOSY
40.0000 mg | PREFILLED_SYRINGE | INTRAMUSCULAR | Status: DC
  Administered 2024-07-24 – 2024-07-27 (×4): 40 mg via SUBCUTANEOUS
  Filled 2024-07-24 (×4): qty 0.4

## 2024-07-24 MED ORDER — BUPIVACAINE HCL (PF) 0.5 % IJ SOLN
INTRAMUSCULAR | Status: AC
  Filled 2024-07-24: qty 30

## 2024-07-24 MED ORDER — TRANEXAMIC ACID-NACL 1000-0.7 MG/100ML-% IV SOLN
1000.0000 mg | INTRAVENOUS | Status: AC
  Administered 2024-07-24: 1000 mg via INTRAVENOUS
  Filled 2024-07-24: qty 100

## 2024-07-24 MED ORDER — OXYCODONE HCL 5 MG PO TABS
5.0000 mg | ORAL_TABLET | ORAL | Status: DC | PRN
  Administered 2024-07-25: 5 mg via ORAL
  Filled 2024-07-24: qty 1

## 2024-07-24 MED ORDER — METHOCARBAMOL 500 MG PO TABS
250.0000 mg | ORAL_TABLET | Freq: Every evening | ORAL | Status: DC | PRN
  Administered 2024-07-25 – 2024-07-28 (×4): 250 mg via ORAL
  Filled 2024-07-24 (×5): qty 1

## 2024-07-24 MED ORDER — MIDAZOLAM HCL (PF) 2 MG/2ML IJ SOLN
INTRAMUSCULAR | Status: DC | PRN
  Administered 2024-07-24: 2 mg via INTRAVENOUS

## 2024-07-24 MED ORDER — MORPHINE SULFATE (PF) 2 MG/ML IV SOLN
2.0000 mg | INTRAVENOUS | Status: DC | PRN
  Administered 2024-07-24 – 2024-07-25 (×2): 2 mg via INTRAVENOUS
  Filled 2024-07-24 (×2): qty 1

## 2024-07-24 MED ORDER — ESCITALOPRAM OXALATE 10 MG PO TABS
20.0000 mg | ORAL_TABLET | Freq: Every day | ORAL | Status: DC
  Administered 2024-07-25 – 2024-07-29 (×5): 20 mg via ORAL
  Filled 2024-07-24 (×5): qty 2

## 2024-07-24 MED ORDER — ACETAMINOPHEN 325 MG PO TABS
650.0000 mg | ORAL_TABLET | Freq: Four times a day (QID) | ORAL | Status: DC | PRN
  Administered 2024-07-25: 650 mg via ORAL
  Filled 2024-07-24: qty 2

## 2024-07-24 MED ORDER — SUGAMMADEX SODIUM 200 MG/2ML IV SOLN
INTRAVENOUS | Status: DC | PRN
  Administered 2024-07-24: 200 mg via INTRAVENOUS

## 2024-07-24 MED ORDER — ONDANSETRON HCL 4 MG PO TABS
4.0000 mg | ORAL_TABLET | Freq: Four times a day (QID) | ORAL | Status: DC | PRN
  Administered 2024-07-28: 4 mg via ORAL
  Filled 2024-07-24 (×2): qty 1

## 2024-07-24 MED ORDER — PHENYLEPHRINE 80 MCG/ML (10ML) SYRINGE FOR IV PUSH (FOR BLOOD PRESSURE SUPPORT)
PREFILLED_SYRINGE | INTRAVENOUS | Status: DC | PRN
  Administered 2024-07-24: 160 ug via INTRAVENOUS
  Administered 2024-07-24: 80 ug via INTRAVENOUS

## 2024-07-24 MED ORDER — CEFAZOLIN SODIUM-DEXTROSE 2-4 GM/100ML-% IV SOLN
2.0000 g | Freq: Three times a day (TID) | INTRAVENOUS | Status: AC
  Administered 2024-07-24 – 2024-07-25 (×3): 2 g via INTRAVENOUS
  Filled 2024-07-24 (×2): qty 100

## 2024-07-24 MED ORDER — ONDANSETRON HCL 4 MG/2ML IJ SOLN
4.0000 mg | Freq: Four times a day (QID) | INTRAMUSCULAR | Status: DC | PRN
  Administered 2024-07-24 – 2024-07-27 (×3): 4 mg via INTRAVENOUS
  Filled 2024-07-24 (×3): qty 2

## 2024-07-24 MED ORDER — SODIUM CHLORIDE 0.9 % IV BOLUS
500.0000 mL | Freq: Once | INTRAVENOUS | Status: AC
  Administered 2024-07-24: 500 mL via INTRAVENOUS

## 2024-07-24 MED ORDER — BUPIVACAINE LIPOSOME 1.3 % IJ SUSP
INTRAMUSCULAR | Status: AC
  Filled 2024-07-24: qty 20

## 2024-07-24 MED ORDER — GLYCOPYRROLATE 0.2 MG/ML IJ SOLN
INTRAMUSCULAR | Status: DC | PRN
  Administered 2024-07-24: .2 mg via INTRAVENOUS

## 2024-07-24 MED ORDER — SODIUM CHLORIDE 0.9 % IV SOLN: INTRAVENOUS | Status: DC

## 2024-07-24 MED ORDER — DROPERIDOL 2.5 MG/ML IJ SOLN: 0.6250 mg | Freq: Once | INTRAMUSCULAR | Status: DC | PRN

## 2024-07-24 MED ORDER — IBUPROFEN 600 MG PO TABS
600.0000 mg | ORAL_TABLET | Freq: Once | ORAL | Status: AC
  Administered 2024-07-24: 600 mg via ORAL
  Filled 2024-07-24: qty 1

## 2024-07-24 MED ORDER — PROPOFOL 10 MG/ML IV BOLUS
INTRAVENOUS | Status: DC | PRN
  Administered 2024-07-24: 200 mg via INTRAVENOUS
  Administered 2024-07-24 (×2): 50 mg via INTRAVENOUS

## 2024-07-24 MED ORDER — ROCURONIUM BROMIDE 100 MG/10ML IV SOLN
INTRAVENOUS | Status: DC | PRN
  Administered 2024-07-24: 30 mg via INTRAVENOUS
  Administered 2024-07-24: 50 mg via INTRAVENOUS

## 2024-07-24 MED ORDER — HYDROMORPHONE HCL 1 MG/ML IJ SOLN
INTRAMUSCULAR | Status: AC
  Filled 2024-07-24: qty 1

## 2024-07-24 MED ORDER — LIDOCAINE HCL (CARDIAC) PF 100 MG/5ML IV SOSY
PREFILLED_SYRINGE | INTRAVENOUS | Status: DC | PRN
  Administered 2024-07-24 (×2): 100 mg via INTRAVENOUS

## 2024-07-24 MED ORDER — FENTANYL CITRATE (PF) 50 MCG/ML IJ SOSY: 50.0000 ug | PREFILLED_SYRINGE | INTRAMUSCULAR | Status: DC | PRN

## 2024-07-24 MED ORDER — CEFAZOLIN SODIUM-DEXTROSE 2-4 GM/100ML-% IV SOLN
INTRAVENOUS | Status: AC
  Filled 2024-07-24: qty 100

## 2024-07-24 MED ORDER — LACTATED RINGERS IV SOLN: INTRAVENOUS | Status: DC | PRN

## 2024-07-24 MED ORDER — POLYETHYLENE GLYCOL 3350 17 G PO PACK: 17.0000 g | PACK | Freq: Every day | ORAL | Status: DC | PRN

## 2024-07-24 MED ORDER — PANTOPRAZOLE SODIUM 40 MG PO TBEC
40.0000 mg | DELAYED_RELEASE_TABLET | Freq: Every day | ORAL | Status: DC
  Administered 2024-07-25 – 2024-07-29 (×5): 40 mg via ORAL
  Filled 2024-07-24 (×6): qty 1

## 2024-07-24 MED ORDER — EPHEDRINE SULFATE (PRESSORS) 25 MG/5ML IV SOSY
PREFILLED_SYRINGE | INTRAVENOUS | Status: DC | PRN
  Administered 2024-07-24 (×2): 10 mg via INTRAVENOUS

## 2024-07-24 MED ORDER — ACETAMINOPHEN 650 MG RE SUPP
650.0000 mg | Freq: Four times a day (QID) | RECTAL | Status: DC | PRN
  Filled 2024-07-24: qty 1

## 2024-07-24 MED ORDER — FENTANYL CITRATE (PF) 50 MCG/ML IJ SOSY
50.0000 ug | PREFILLED_SYRINGE | Freq: Once | INTRAMUSCULAR | Status: AC
  Administered 2024-07-24: 50 ug via INTRAVENOUS
  Filled 2024-07-24: qty 1

## 2024-07-24 MED ORDER — 0.9 % SODIUM CHLORIDE (POUR BTL) OPTIME
TOPICAL | Status: DC | PRN
  Administered 2024-07-24: 500 mL

## 2024-07-24 MED ORDER — HYDROCODONE-ACETAMINOPHEN 5-325 MG PO TABS
2.0000 | ORAL_TABLET | Freq: Once | ORAL | Status: AC
  Administered 2024-07-24: 2 via ORAL
  Filled 2024-07-24: qty 2

## 2024-07-24 MED ORDER — FENTANYL CITRATE (PF) 100 MCG/2ML IJ SOLN
INTRAMUSCULAR | Status: DC | PRN
  Administered 2024-07-24: 100 ug via INTRAVENOUS
  Administered 2024-07-24: 50 ug via INTRAVENOUS
  Administered 2024-07-24: 100 ug via INTRAVENOUS

## 2024-07-24 MED ORDER — FENTANYL CITRATE (PF) 100 MCG/2ML IJ SOLN: 25.0000 ug | INTRAMUSCULAR | Status: DC | PRN

## 2024-07-24 MED ORDER — BUPROPION HCL ER (XL) 150 MG PO TB24
300.0000 mg | ORAL_TABLET | Freq: Every day | ORAL | Status: DC
  Administered 2024-07-25 – 2024-07-29 (×5): 300 mg via ORAL
  Filled 2024-07-24 (×6): qty 2

## 2024-07-24 MED ORDER — BUPIVACAINE LIPOSOME 1.3 % IJ SUSP
INTRAMUSCULAR | Status: DC | PRN
  Administered 2024-07-24: 50 mL via SUBCUTANEOUS

## 2024-07-24 MED ORDER — DEXAMETHASONE SOD PHOSPHATE PF 10 MG/ML IJ SOLN
INTRAMUSCULAR | Status: DC | PRN
  Administered 2024-07-24: 10 mg via INTRAVENOUS

## 2024-07-24 NOTE — ED Notes (Signed)
 Pt in MRI.

## 2024-07-24 NOTE — Anesthesia Procedure Notes (Signed)
 Procedure Name: Intubation Date/Time: 07/24/2024 4:32 PM  Performed by: Norleen Alberta HERO., CRNAPre-anesthesia Checklist: Patient identified, Patient being monitored, Timeout performed, Emergency Drugs available and Suction available Patient Re-evaluated:Patient Re-evaluated prior to induction Oxygen Delivery Method: Circle system utilized Preoxygenation: Pre-oxygenation with 100% oxygen Induction Type: IV induction Ventilation: Mask ventilation without difficulty Laryngoscope Size: McGrath and 4 Grade View: Grade I Tube type: Oral Tube size: 8.0 mm Number of attempts: 1 Placement Confirmation: ETT inserted through vocal cords under direct vision, positive ETCO2 and breath sounds checked- equal and bilateral Secured at: 23 cm Tube secured with: Tape Dental Injury: Teeth and Oropharynx as per pre-operative assessment

## 2024-07-24 NOTE — Anesthesia Postprocedure Evaluation (Signed)
"   Anesthesia Post Note  Patient: HASSEL UPHOFF  Procedure(s) Performed: FIXATION, FRACTURE, INTERTROCHANTERIC, WITH INTRAMEDULLARY ROD (Left)  Patient location during evaluation: PACU Anesthesia Type: General Level of consciousness: awake and alert Pain management: pain level controlled Vital Signs Assessment: post-procedure vital signs reviewed and stable Respiratory status: spontaneous breathing, nonlabored ventilation, respiratory function stable and patient connected to nasal cannula oxygen Cardiovascular status: blood pressure returned to baseline and stable Postop Assessment: no apparent nausea or vomiting Anesthetic complications: no   There were no known notable events for this encounter.   Last Vitals:  Vitals:   07/24/24 1815 07/24/24 1830  BP: (!) 136/90 (!) 156/95  Pulse: 79 83  Resp: 17 16  Temp:  (!) 36.2 C  SpO2: 93% 95%    Last Pain:  Vitals:   07/24/24 1830  TempSrc:   PainSc: 0-No pain                 Prentice Murphy      "

## 2024-07-24 NOTE — Consult Note (Signed)
 "                                                                ORTHOPAEDIC CONSULTATION  REQUESTING PHYSICIAN: Dicky Anes, MD  Chief Complaint:   Left hip pain  History of Present Illness: Jared [name] is a 69 y.o. male with a past medical history of bilateral lower extremity neuropathy and foot drop who had a mechanical fall after tripping earlier today.  The patient noted immediate hip pain and inability to ambulate.  The patient ambulates independently at baseline but carries a cane and occasionally uses this for ambulatory support.  Patient lives at home with his wife and functions independently.  He is physically active.  Pain is worse with any sort of movement.  X-rays in the emergency department were negative but an MRI demonstrated a nondisplaced subcapital femoral neck fracture with extension into the intertrochanteric region of the proximal femur.  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   CHOLECYSTECTOMY     added umbilical mesh   COLONOSCOPY WITH PROPOFOL  N/A 03/27/2017   Procedure: COLONOSCOPY WITH PROPOFOL ;  Surgeon: Viktoria Lamar DASEN, MD;  Location: [facility] ENDOSCOPY;  Service: Endoscopy;  Laterality: N/A;   COLONOSCOPY WITH PROPOFOL  N/A 10/07/2022   Procedure: COLONOSCOPY WITH PROPOFOL ;  Surgeon: Maryruth Ole DASEN, MD;  Location: [facility] ENDOSCOPY;  Service: Endoscopy;  Laterality: N/A;    ESOPHAGOGASTRODUODENOSCOPY (EGD) WITH PROPOFOL  N/A 03/27/2017   Procedure: ESOPHAGOGASTRODUODENOSCOPY (EGD) WITH PROPOFOL ;  Surgeon: Viktoria Lamar DASEN, MD;  Location: [facility] ENDOSCOPY;  Service: Endoscopy;  Laterality: N/A;   hip fracture with rod placement Right    MASS EXCISION Left 05/24/2021   Procedure: EXCISION MASS;  Surgeon: Tye Millet, DO;  Location: [facility] ORS;  Service: General;  Laterality: Left;  Prone postition   Social History   Socioeconomic History   Marital status: Married    Spouse name: Not on file   Number of children: 2   Years of education: Not on file   Highest education level: Associate degree: occupational, scientist, product/process development, or vocational program  Occupational History   Not on file  Tobacco Use   Smoking status: Former    Current packs/day: 0.00    Average packs/day: 1 pack/day for 45.0 years (45.0 ttl pk-yrs)    Types: Cigarettes    Start date: 01/29/1974    Quit date: 01/30/2019    Years since quitting: 5.4    Passive exposure: Past   Smokeless tobacco: Never  Vaping Use   Vaping status: Former  Substance and Sexual Activity   Alcohol use: No   Drug use: No   Sexual activity: Yes    Birth control/protection: None  Other Topics Concern   Not on file  Social History Narrative   Lives with wife   Social Drivers of Health   Tobacco Use: Medium Risk (07/24/2024)   Patient History    Smoking Tobacco Use: Former    Smokeless Tobacco Use: Never    Passive Exposure: Past  Physicist, Medical Strain: Low Risk  (05/17/2024)   Received from [facility]   Overall Financial Resource Strain (CARDIA)    Difficulty of Paying Living Expenses: Not hard at all  Food Insecurity: No Food Insecurity (05/17/2024)   Received from [facility]   Epic    Within the past 12 months, you worried that your food would run out before you got the money to buy more.: Never true    Within the past 12 months, the food you bought just didn't last and you  didn't have money to get more.: Never true  Transportation Needs: No Transportation Needs (05/17/2024)   Received from [facility]   PRAPARE - Transportation    Lack of Transportation (Non-Medical): No    In the past 12 months, has lack of transportation kept you from medical appointments or from getting medications?: No  Physical Activity: Not on file  Stress: Not on file  Social Connections: Not on file  Depression (PHQ2-9): Medium Risk (06/02/2024)   Depression (PHQ2-9)    PHQ-2 Score: 7  Alcohol Screen: Not on file  Housing: Low Risk  (05/17/2024)   Received from [facility]   Epic    At any time in the past 12 months, were you homeless or living in a shelter (including now)?: No    In the past 12 months, how many times have you moved where you were living?: 0    In the last 12 months, was there a time when you were not able to pay the mortgage or rent on time?: No  Utilities: Not At Risk (05/17/2024)   Received from [facility]   Epic    In the past 12 months has the electric, gas, oil, or water company threatened to shut off services in your home?: No  Health Literacy: Not on file   Family History  Problem Relation Age of Onset   Cancer Mother    Depression Mother    Stroke Father    Depression Father    Aneurysm Father    Depression Sister    Depression Sister    Cancer Brother    Depression Brother    Allergies[1] Prior to Admission medications  Medication Sig Start Date End Date Taking? Authorizing Provider  buPROPion  (WELLBUTRIN  XL) 300 MG 24 hr tablet Take 1 tablet (300 mg total) by mouth daily. 04/08/24 10/05/24  Vickey Mettle, MD  clotrimazole -betamethasone  (LOTRISONE ) cream Apply 1 Application topically 2 (two) times daily. 11/10/23   Francisca Redell BROCKS, MD  escitalopram  (LEXAPRO ) 20 MG tablet Take 1 tablet (20 mg total) by mouth daily. 06/02/24 11/29/24  Vickey Mettle, MD  ibuprofen  (ADVIL ,MOTRIN ) 200 MG tablet  Take 400 mg by mouth every 6 (six) hours as needed for mild pain.    [provider]  Meth-Hyo-M Bl-Na Phos-Ph Sal (URIBEL ) 118 MG CAPS Take 1 capsule (118 mg total) by mouth daily as needed (dysuria). 11/05/22   Francisca Redell BROCKS, MD  methocarbamol  (ROBAXIN )  500 MG tablet Take 250 mg by mouth at bedtime as needed for muscle spasms. 04/03/21   [provider]  pantoprazole  (PROTONIX ) 40 MG tablet Take 40 mg by mouth daily. 09/04/23 09/03/24  [provider]  sildenafil  (REVATIO ) 20 MG tablet Take 1 tablet (20 mg total) by mouth daily as needed (TAKE 30 MINUTES PRIOR TO SEXUAL ACTIVITY). 11/10/23   Francisca Redell BROCKS, MD  tadalafil  (CIALIS ) 10 MG tablet Take 1 tablet (10 mg total) by mouth daily as needed for erectile dysfunction. 11/05/22   Francisca Redell BROCKS, MD   Recent Labs    07/24/24 1030  WBC 6.4  HGB 14.3  HCT 42.1  PLT 257   MR HIP LEFT WO CONTRAST Result Date: 07/24/2024 EXAM: MRI of the left HIP WITHOUT contrast. 07/24/2024 10:36:04 AM TECHNIQUE: Multiplanar multisequence MRI of the left hip was performed without the administration of intravenous contrast. COMPARISON: Plain film radiograph of the pelvis from 12/19/2024. CLINICAL HISTORY: Hip trauma, fracture suspected, no prior imaging. FINDINGS: BONE MARROW: Acute fracture involving the proximal left femur with disruption of the proximal femoral cortex with fracture line extending from the lateral subcapital femoral neck to the lesser trochanter as well as the intertrochanteric region. Marked surrounding bone marrow edema is identified. Previous open reduction and internal fixation of the right femur with intramedullary rod and cannulated screw in place with signal artifact. HIP JOINT: Small left hip joint effusion. No subchondral cysts or significant degenerative disease. SOFT TISSUES: Hemorrhages identified into the soft tissue surrounding the proximal left femur. No focal soft tissue mass. INTRAPELVIC CONTENTS: Limited  images of the intrapelvic contents demonstrate no acute abnormality. IMPRESSION: 1. Acute, slightly impacted, subcapital left proximal femur fracture involving the femoral neck with intertrochanteric extension. Electronically signed by: Waddell Calk MD 07/24/2024 02:04 PM EST RP Workstation: [id number]   DG Femur Min 2 Views Left Result Date: 07/24/2024 CLINICAL DATA:  Left hip pain after fall EXAM: LEFT FEMUR 2 VIEWS COMPARISON:  None Available. FINDINGS: There is no evidence of fracture or other focal bone lesions. Soft tissues are unremarkable. IMPRESSION: Negative. Electronically Signed   By: Lynwood Landy Raddle M.D.   On: 07/24/2024 10:27   DG Pelvis 1-2 Views Result Date: 07/24/2024 CLINICAL DATA:  Pelvic pain after fall EXAM: PELVIS - 1-2 VIEW COMPARISON:  None Available. FINDINGS: There is no evidence of acute pelvic fracture or diastasis. Status post surgical internal fixation of old proximal right femoral fracture. No pelvic bone lesions are seen. IMPRESSION: No acute abnormality seen. Electronically Signed   By: Lynwood Landy Raddle M.D.   On: 07/24/2024 10:23     Positive ROS: All other systems have been reviewed and were otherwise negative with the exception of those mentioned in the HPI and as above.  Physical Exam: BP (!) 141/83   Pulse (!) 55   Temp 98.5 F (36.9 C) (Oral)   Resp 18   Ht 6' 6 (1.981 m)   Wt 115.3 kg   SpO2 95%   BMI 29.37 kg/m  General:  Alert, no acute distress Psychiatric:  Patient is competent for consent with normal mood and affect    Orthopaedic Exam:  Left Lower Extremity: He is able to demonstrate plantarflexion of the ankle and of the great toe.  He is able to grossly wiggle his toes.  He lacks dorsiflexion of the ankle or great toe. Sensation to light touch is decreased throughout the entirety of the foot and ankle Foot wwp +Log roll/axial load  Imaging:  Radiographs negative for fracture; MRI demonstrated a nondisplaced subcapital femoral neck  fracture with extension into the intertrochanteric region  Assessment/Plan: Jared [name] is a 69 y.o. male with a nondisplaced left subcapital femoral neck fracture with intertrochanteric extension   1. I discussed the various treatment options including both surgical and non-surgical management of the fracture with the patient and his wife.  We discussed the risks, benefits, alternatives to surgery.  Specific risks discussed included but were not limited to infection, injury to nearby anatomic structures, malunion, nonunion, hardware related complications, need for revision surgery, continued pain and difficulty with ambulation, blood loss/hematoma, MI, stroke, death, blood clot/pulmonary embolism.  After discussion of risks, benefits, and alternatives to surgery, the family and/or patient were in agreement to proceed with surgery. The goals of surgery would be to provide adequate pain relief and allow for mobilization. Plan for surgery is left hip cephalomedullary nailing today, 07/24/2024. 2. NPO until OR 3. PT postop 4. DVT ppx   Jackquline GORMAN Barrack 07/24/2024 2:12 PM      [1] No Known Allergies  "

## 2024-07-24 NOTE — ED Notes (Signed)
 Called lab at this time. Blood work sent down earlier upon pt arrival. Per lab they are able to use samples.

## 2024-07-24 NOTE — Transfer of Care (Signed)
 Immediate Anesthesia Transfer of Care Note  Patient: Jared [name]  Procedure(s) Performed: FIXATION, FRACTURE, INTERTROCHANTERIC, WITH INTRAMEDULLARY ROD (Left)  Patient Location: PACU  Anesthesia Type:General  Level of Consciousness: awake and patient cooperative  Airway & Oxygen Therapy: Patient Spontanous Breathing and Patient connected to face mask oxygen  Post-op Assessment: Report given to RN and Post -op Vital signs reviewed and stable  Post vital signs: stable  Last Vitals:  Vitals Value Taken Time  BP    Temp    Pulse    Resp    SpO2      Last Pain:  Vitals:   07/24/24 1347  TempSrc:   PainSc: 3          Complications: There were no known notable events for this encounter.

## 2024-07-24 NOTE — Op Note (Addendum)
 DATE OF SURGERY: 07/24/2024  PREOPERATIVE DIAGNOSIS: Left intertrochanteric hip fracture  POSTOPERATIVE DIAGNOSIS: Left intertrochanteric hip fracture  PROCEDURE: Intramedullary nailing of left femur with cephalomedullary device (CPT [phone])  SURGEON: Jackquline CANDIE Barrack, MD  ANESTHESIA: General  EBL: 100 cc  IVF: per anesthesia record  COMPONENTS:  Implant Name Type Inv. Item Serial No. Manufacturer Lot No. LRB No. Used Action  NAIL INTERTAN 10X18 130D 10S - [id number] Nail NAIL INTERTAN 10X18 130D 10S  [employer] [id number] Left 1 Implanted  SCREW LAG COMPR KIT 105/100 - [id number] Screw SCREW LAG COMPR KIT 105/100  [employer] [id number] Left 1 Implanted  SCREW TRIGEN LOW PROF 5.0X35 - [id number] Screw SCREW TRIGEN LOW PROF 5.0X35  [employer] [id number] Left 1 Implanted     INDICATIONS: Jared [name] is a 69 y.o. male who sustained an intertrochanteric fracture after a fall. Risks and benefits of intramedullary nailing were explained to the patient and family. Risks include but are not limited to bleeding, infection, injury to tissues, nerves, vessels, nonunion/malunion, hardware failure, limb length discrepancy/hip rotation mismatch and risks of anesthesia. The patient and family understand these risks, have completed an informed consent, and wish to proceed.   PROCEDURE:  The patient was brought into the operating room. After administering anesthesia, the patient was placed in the supine position on the Hana table. The uninjured leg was placed in an extended position while the injured lower extremity was placed in longitudinal traction. A specific reduction maneuver was not needed in this case as the fracture was nondisplaced but fluoroscopic images were obtained before prepping to ensure adequate visualization. The lateral aspect of the hip and thigh were prepped with ChloraPrep solution before being draped sterilely. Preoperative IV  antibiotics and TXA were administered. A timeout was performed to verify the appropriate surgical site, patient, and procedure.    The greater trochanter was identified and an incision was made about 3 fingerbreadths above the tip of the greater trochanter. The incision was carried down through the subcutaneous tissues to expose the gluteal fascia. This was split the length of the incision, providing access to the tip of the trochanter. Under fluoroscopic guidance, a guidewire was drilled through the tip of the trochanter into the proximal metaphysis to the level of the lesser trochanter. After verifying its position fluoroscopically in AP and lateral projections, it was overreamed with the opening reamer to the level of the lesser trochanter. The nail was selected and advanced to the appropriate depth as verified fluoroscopically.    The guide system for the lag screw was positioned and advanced through an incision over the lateral aspect of the proximal femur. The guidewire was drilled up through the femoral nail and into the femoral neck. After verifying its position in the femoral neck and head in both AP and lateral projections, the guidewire was measured and appropriate sized lag screw was selected.  The channel for the compression screw was drilled and antirotation bar was placed.  Lag screw was drilled and placed in appropriate position.  The compression screw was then placed.  Appropriate compression was achieved.  The set screw was locked in place. Again, the adequacy of hardware position and fracture reduction was verified fluoroscopically in AP and lateral projections.   Attention was then turned to the distal interlocking screw in the diaphysis. Using a targeted assembly, a stab incision was made and hole was drilled through the nail. An interlocking screw was placed with excellent purchase.  Appropriate screw position was verified fluoroscopically in AP and lateral projections.   The wounds  were irrigated thoroughly with sterile saline solution. Local anesthetic was injected into the wounds. Deep fascia was closed with 0-Vicryl. The subcutaneous tissues were closed using 2-0 Vicryl interrupted sutures. The skin was closed using staples. Sterile occlusive dressings were applied to all wounds. The patient was then transferred to the recovery room in satisfactory condition.   POSTOPERATIVE PLAN: The patient will be WBAT on the operative extremity. DVT ppx. Perioperative IV antibiotics x 24 hours. PT/OT on POD#1.

## 2024-07-24 NOTE — ED Triage Notes (Signed)
 pt in via [facility] for a fall this morning for roughly an hour Pt states he slipped and then tripped when going to get something to eat. Pt fell on left hip and rolled to his right side. pt on 2L due to drop in O2 sats from pain meds. Pt is A&Ox4. Denies hitting head.  150mcg of fentanyl  given per EMS.

## 2024-07-24 NOTE — ED Provider Notes (Signed)
 "  [facility] Provider Note    Event Date/Time   First MD Initiated Contact with Patient 07/24/24 [phone]     (approximate)   History   Fall   HPI  Jared [name] is a 69 y.o. male reports he was in his regular health to today.  He was up moving and just slightly off balance while up, he fell directly and hard on his left hip.  He did not lose consciousness he did not hit his head he has no neck pain.  Slight scuff along his left elbow with no pain or discomfort.  He reports he had severe pain now just over his left hip.  The pain is much better now after treatment with EMS.  There is no numbness or weakness in the leg but he does report chronic neuropathy and slight swelling in both of his feet attributable to chronic neuropathy.  He has never had any recent illnesses.  He otherwise feels well.  Was in his normal health.  Reviewed external records from Dr. Vickey with the patient saw on December 30.  Being evaluated for cognitive changes.  He does not take any anticoagulants.  Reviewed his medications from his doctor's visit.  His doctor is following up evaluating for cognitive changes now for more over a month, recent studies thyroid  vitamins ferritin have been assessed.  Seen by neurology as well  Past Medical History:  Diagnosis Date   Arthritis    Cancer (HCC)    pancreas   Complication of anesthesia    tends to come out of anesthesia during procedure   Depression    Dysphagia    Elevated lipids    Falls    GERD (gastroesophageal reflux disease)    H/O blood clots    superficial blood clot in left leg below knee tx w/ eliquis  x 3 mo   History of kidney stones    Kidney disease    Neuroendocrine tumor (HCC)    Subdural hematoma (HCC)    hx of   Tobacco abuse    Unsteadiness      Physical Exam   Triage Vital Signs: ED Triage Vitals [07/24/24 0926]  Encounter Vitals Group     BP      Girls Systolic BP Percentile      Girls Diastolic BP  Percentile      Boys Systolic BP Percentile      Boys Diastolic BP Percentile      Pulse      Resp      Temp      Temp src      SpO2      Weight 254 lb 3.1 oz (115.3 kg)     Height 6' 6 (1.981 m)     Head Circumference      Peak Flow      Pain Score      Pain Loc      Pain Education      Exclude from Growth Chart     Most recent vital signs: Vitals:   07/24/24 1050 07/24/24 1250  BP:  (!) 141/83  Pulse:  (!) 55  Resp:  18  Temp: 98.5 F (36.9 C)   SpO2:  95%     General: Awake, no distress.  CV:  Palpable dorsalis pedis posterior tibial left foot warm and well-perfused.  Good peripheral perfusion. Normal rate and heart tones. Resp:   Normal effort. Lung sounds clear bilateral. Speaking without distress. Abd:  No distention. Soft, non-tender to palpation in all quadrants. No rebound or guarding. Neuro:   No focal neuro deficits noted. Moves extremities well without noted concern. Other:  Logrolled no tenderness to palpation along the cervical thoracic or lumbar spine.  Full range of motion of neck without pain or discomfort.  Patient denies any head strike.  Normocephalic atraumatic no bruising over the chest abdomen pelvis back thighs lower legs bilateral.  Only exception slight abrasion at the left elbow that is already scabbed over.  No bleeding   ED Results / Procedures / Treatments   Labs (all labs ordered are listed, but only abnormal results are displayed) Labs Reviewed  BASIC METABOLIC PANEL WITH GFR - Abnormal; Notable for the following components:      Result Value   Glucose, Bld 102 (*)    All other components within normal limits  CBC  PROTIME-INR  APTT   Normal CBC and metabolic panel  EKG Interpreted by me at 9:30 AM heart rate 55 QRS 120 QTc 420 Left axis.  Left anterior fascicular block. normal sinus rhythm, no evidence of acute infarct.  Left anterior fascicular block  RADIOLOGY I independently reviewed images of left hip/femur plain films  and see no acute fracture.  I did however look at his left hip MRI and discussed it with both orthopedic and radiologist who affirmed hip fracture on the left    PROCEDURES:  Critical Care performed: No  Procedures   MEDICATIONS ORDERED IN ED: Medications  fentaNYL  (SUBLIMAZE ) injection 50 mcg (has no administration in time range)  sodium chloride  0.9 % bolus 500 mL (has no administration in time range)  HYDROcodone -acetaminophen  (NORCO/VICODIN) 5-325 MG per tablet 2 tablet (2 tablets Oral Given 07/24/24 1051)  sodium chloride  0.9 % bolus 500 mL (0 mLs Intravenous Stopped 07/24/24 1306)  ibuprofen  (ADVIL ) tablet 600 mg (600 mg Oral Given 07/24/24 1305)  fentaNYL  (SUBLIMAZE ) injection 50 mcg (50 mcg Intravenous Given 07/24/24 1304)     IMPRESSION / MDM / ASSESSMENT AND PLAN / ED COURSE  I reviewed the triage vital signs and the nursing notes.                              Based on presentation, the differential diagnosis includes, but is not limited to key considerations:  Exclude pelvic or hip fracture.  Reassuring exam full range of motion of left hip left lower leg at this time but he does report the area to have soreness overlying and abutting the area of the trochanter on the left.  There is no bruising.  Very quite quite a bit of pain medicine with EMS so question if he could be hiding an occult fracture.  There is no obvious straightening or rotation at this time.  He demonstrates good range of motion but given he was treated with 150 mcg of fentanyl  and reports the pain prior to that was quite severe I think it is possible that he could have a pelvic or hip/femur injury.  Will start by obtaining plain films if these are negative do anticipate MRI of the hip to exclude occult fracture  No preceding illness.  Reports that simply stumbling there is no evidence of head injury or otherwise.  No indication for laboratory testing and imaging at this time  Patient's presentation is most  consistent with acute complicated illness / injury requiring diagnostic workup.    The patient is on the cardiac monitor  to evaluate for evidence of arrhythmia and/or significant heart rate changes.  Clinical Course as of 07/24/24 1419  Sun Jul 24, 2024  1020 Left hip x-ray inter by me as negative for acute fracture.  Will obtain MRI to exclude occult fracture [MQ]  1254 Called [place] rads : delay in L hip read. Rads team escalate, had not been in rads queue yet for some reason [MQ]  1358 Dr. Ezra of orthopedics called me, advises on his review of the MRI that he is concerned the patient has a left hip fracture.  He has requested I obtain preoperative labs.  I discussed the case with him.  He will provide further consultation/review of MRI once radiology finalizes the read.  He is also called radiology requesting they read it soon [MQ]  1359 Patient was having some spasms around the left hip.  Treated with fentanyl . [MQ]  1405 Radiologist Dr. Joan called we discussed the case.  He advises the patient does have an acute left hip fracture.  Dr. Ezra of orthopedics already aware.  Patient being admitted to the hospitalist service.  Patient agreeable with plan.  Pain currently well-controlled, as needed medication written [MQ]    Clinical Course User Index [MQ] Dicky Anes, MD   ----------------------------------------- 2:18 PM on 07/24/2024 ----------------------------------------- Admitted to the care of Dr. Granville, orthopedics consulted  FINAL CLINICAL IMPRESSION(S) / ED DIAGNOSES   Final diagnoses:  Acute hip pain, left  Closed fracture of left hip, initial encounter [facility])     Rx / DC Orders   ED Discharge Orders     None        Note:  This document was prepared using Dragon voice recognition software and may include unintentional dictation errors.   Dicky Anes, MD 07/24/24 1419  "

## 2024-07-24 NOTE — Anesthesia Preprocedure Evaluation (Signed)
 "                                  Anesthesia Evaluation  Patient identified by MRN, date of birth, ID band Patient awake    Reviewed: Allergy & Precautions, H&P , NPO status , Patient's Chart, lab work & pertinent test results  History of Anesthesia Complications (+) AWARENESS UNDER ANESTHESIA and history of anesthetic complications  Airway Mallampati: II  TM Distance: >3 FB Neck ROM: full    Dental  (+) Missing, Dental Advidsory Given   Pulmonary neg shortness of breath, sleep apnea and Continuous Positive Airway Pressure Ventilation , neg COPD, neg recent URI, former smoker   Pulmonary exam normal        Cardiovascular negative cardio ROS Normal cardiovascular exam     Neuro/Psych neg Seizures PSYCHIATRIC DISORDERS  Depression    H/o Subdural hematoma TIA   GI/Hepatic Neg liver ROS,GERD  Medicated,,  Endo/Other  negative endocrine ROS    Renal/GU Renal disease (stones)  negative genitourinary   Musculoskeletal   Abdominal Normal abdominal exam  (+)   Peds  Hematology negative hematology ROS (+)   Anesthesia Other Findings Past Medical History: No date: Arthritis No date: Cancer (HCC)     Comment:  pancreas No date: Complication of anesthesia     Comment:  tends to come out of anesthesia during procedure No date: Depression No date: Dysphagia No date: Elevated lipids No date: Falls No date: GERD (gastroesophageal reflux disease) No date: H/O blood clots     Comment:  superficial blood clot in left leg below knee tx w/               eliquis  x 3 mo No date: History of kidney stones No date: Kidney disease No date: Neuroendocrine tumor No date: Subdural hematoma (HCC)     Comment:  hx of No date: Tobacco abuse No date: Unsteadiness  Past Surgical History: No date: ABDOMINAL SURGERY     Comment:  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 No date: CHOLECYSTECTOMY     Comment:  added umbilical mesh 03/27/2017: COLONOSCOPY WITH  PROPOFOL ; N/A     Comment:  Procedure: COLONOSCOPY WITH PROPOFOL ;  Surgeon: Viktoria Lamar DASEN, MD;  Location: [facility] ENDOSCOPY;  Service:               Endoscopy;  Laterality: N/A; 03/27/2017: ESOPHAGOGASTRODUODENOSCOPY (EGD) WITH PROPOFOL ; N/A     Comment:  Procedure: ESOPHAGOGASTRODUODENOSCOPY (EGD) WITH               PROPOFOL ;  Surgeon: Viktoria Lamar DASEN, MD;  Location:               [facility] ENDOSCOPY;  Service: Endoscopy;  Laterality: N/A; No date: hip fracture with rod placement; Right 05/24/2021: MASS EXCISION; Left     Comment:  Procedure: EXCISION MASS;  Surgeon: Tye Millet, DO;                Location: [facility] ORS;  Service: General;  Laterality: Left;              Prone postition  BMI    Body Mass Index: 25.08 kg/m      Reproductive/Obstetrics negative OB ROS  Anesthesia Physical Anesthesia Plan  ASA: 3  Anesthesia Plan: General   Post-op Pain Management:    Induction: Intravenous  PONV Risk Score and Plan: Ondansetron , Dexamethasone  and Treatment may vary due to age or medical condition  Airway Management Planned: Oral ETT  Additional Equipment:   Intra-op Plan:   Post-operative Plan: Extubation in OR  Informed Consent: I have reviewed the patients History and Physical, chart, labs and discussed the procedure including the risks, benefits and alternatives for the proposed anesthesia with the patient or authorized representative who has indicated his/her understanding and acceptance.     Dental Advisory Given  Plan Discussed with: CRNA and Surgeon  Anesthesia Plan Comments:          Anesthesia Quick Evaluation  "

## 2024-07-24 NOTE — H&P (Signed)
 "  History and Physical    Jared [name] FMW:[id number] DOB: July 02, 1956 DOA: 07/24/2024  DOS: the patient was seen and examined on 07/24/2024  PCP: Glover Lenis, MD   Patient coming from: Home  I have personally briefly reviewed patient's old medical records in [facility]  Chief Complaint: Left hip pain after a fall  HPI: Jared [name] is a pleasant 69 y.o. male with medical history significant for anxiety/depression, GERD, concern for mild cognitive impairment, chronic peripheral neuropathy, pancreatic neuroendocrine tumor s/p surgery who came into ED after a fall at home this morning.  Patient and his wife stated that patient was trying to move stuffs at home.  He stated that he lost his balance and hung up on the floor and fell on the left side injuring his left hip and left elbow.  He denies any loss of consciousness or hitting head.  Due to severe pain on the left hip EMS was called. He denies any heart problem, diabetes, hypertension.  Denies any fever, chills, nausea, vomiting, chest pain, shortness of breath, palpitations.  ED Course: Upon arrival to the ED, patient is found to be in pain around left hip, normal CBC and BMP, EKG showed sinus bradycardia at 55 bpm, no ST elevation, x-ray of the left hip showed no acute fracture.  MRI of the left hip was done which showed left subcapital femoral neck fracture.  Orthopedic team was consulted and Dr. Ezra from orthopedic team will see the patient.  Hospitalist service was consulted for evaluation for admission.  Review of Systems:  ROS  All other systems negative except as noted in the HPI.  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   CHOLECYSTECTOMY     added umbilical mesh   COLONOSCOPY WITH PROPOFOL  N/A 03/27/2017   Procedure: COLONOSCOPY WITH PROPOFOL ;  Surgeon: Viktoria Lamar DASEN, MD;  Location: [facility] ENDOSCOPY;  Service: Endoscopy;  Laterality: N/A;   COLONOSCOPY WITH PROPOFOL  N/A 10/07/2022   Procedure: COLONOSCOPY WITH PROPOFOL ;  Surgeon: Maryruth Ole DASEN, MD;  Location: [facility] ENDOSCOPY;  Service: Endoscopy;  Laterality: N/A;   ESOPHAGOGASTRODUODENOSCOPY (EGD) WITH PROPOFOL  N/A 03/27/2017   Procedure: ESOPHAGOGASTRODUODENOSCOPY (EGD) WITH PROPOFOL ;  Surgeon: Viktoria Lamar DASEN, MD;  Location: [facility] ENDOSCOPY;  Service: Endoscopy;  Laterality: N/A;   hip fracture with rod placement Right    MASS EXCISION Left 05/24/2021   Procedure: EXCISION MASS;  Surgeon: Tye Millet, DO;  Location: [facility] ORS;  Service: General;  Laterality: Left;  Prone postition     reports that he quit smoking about 5 years ago. His smoking use included cigarettes. He started smoking about 50 years ago. He has a 45 pack-year smoking history. He has been exposed to tobacco  smoke. He has never used smokeless tobacco. He reports that he does not drink alcohol and does not use drugs.  Allergies[1]  Family History  Problem Relation Age of Onset   Cancer Mother    Depression Mother    Stroke Father    Depression Father    Aneurysm Father    Depression Sister    Depression Sister    Cancer Brother    Depression Brother     Prior to Admission medications  Medication Sig Start Date End Date Taking? Authorizing Provider  buPROPion  (WELLBUTRIN  XL) 300 MG 24 hr tablet Take 1 tablet (300 mg total) by mouth daily. 04/08/24 10/05/24  Vickey Mettle, MD   clotrimazole -betamethasone  (LOTRISONE ) cream Apply 1 Application topically 2 (two) times daily. 11/10/23   Francisca Redell BROCKS, MD  escitalopram  (LEXAPRO ) 20 MG tablet Take 1 tablet (20 mg total) by mouth daily. 06/02/24 11/29/24  Vickey Mettle, MD  ibuprofen  (ADVIL ,MOTRIN ) 200 MG tablet Take 400 mg by mouth every 6 (six) hours as needed for mild pain.    [provider]  Meth-Hyo-M Bl-Na Phos-Ph Sal (URIBEL ) 118 MG CAPS Take 1 capsule (118 mg total) by mouth daily as needed (dysuria). 11/05/22   Francisca Redell BROCKS, MD  methocarbamol  (ROBAXIN ) 500 MG tablet Take 250 mg by mouth at bedtime as needed for muscle spasms. 04/03/21   [provider]  pantoprazole  (PROTONIX ) 40 MG tablet Take 40 mg by mouth daily. 09/04/23 09/03/24  [provider]  sildenafil  (REVATIO ) 20 MG tablet Take 1 tablet (20 mg total) by mouth daily as needed (TAKE 30 MINUTES PRIOR TO SEXUAL ACTIVITY). 11/10/23   Francisca Redell BROCKS, MD  tadalafil  (CIALIS ) 10 MG tablet Take 1 tablet (10 mg total) by mouth daily as needed for erectile dysfunction. 11/05/22   Francisca Redell BROCKS, MD    Physical Exam: Vitals:   07/24/24 0926 07/24/24 0935 07/24/24 1050 07/24/24 1250  BP:  (!) 142/82  (!) 141/83  Pulse:  (!) 59  (!) 55  Resp:  12  18  Temp:   98.5 F (36.9 C)   TempSrc:   Oral   SpO2:  98%  95%  Weight: 115.3 kg     Height: 6' 6 (1.981 m)       Physical Exam   Constitutional: Alert, awake, calm, comfortable HEENT: Neck supple Respiratory: Clear to auscultation B/L, no wheezing, no rales.  Cardiovascular: Regular rate and rhythm, no murmurs / rubs / gallops. No extremity edema. 2+ pedal pulses. No carotid bruits.  Abdomen: Soft, no tenderness, Bowel sounds positive.  Musculoskeletal: Mild tenderness over the left hip.  Tenderness over the left elbow.  Skin: no rashes, lesions, ulcers. Neurologic: CN 2-12 grossly intact. Sensation intact, No focal deficit identified Psychiatric: Alert and oriented x 3. Normal  mood.    Labs on Admission: I have personally reviewed following labs and imaging studies  CBC: Recent Labs  Lab 07/24/24 1030  WBC 6.4  HGB 14.3  HCT 42.1  MCV 92.7  PLT 257   Basic Metabolic Panel: Recent Labs  Lab 07/24/24 1030  NA 142  K 4.4  CL 105  CO2 25  GLUCOSE 102*  BUN 16  CREATININE 1.08  CALCIUM 9.5   GFR: Estimated Creatinine Clearance: 89.6 mL/min (by C-G formula based on SCr of 1.08 mg/dL). Liver Function Tests: No results for input(s): AST, ALT, ALKPHOS, BILITOT, PROT, ALBUMIN in the last 168 hours. No results for input(s): LIPASE, AMYLASE in the last 168 hours. No  results for input(s): AMMONIA in the last 168 hours. Coagulation Profile: Recent Labs  Lab 07/24/24 1030  INR 1.0   Cardiac Enzymes: No results for input(s): CKTOTAL, CKMB, CKMBINDEX, TROPONINI, TROPONINIHS in the last 168 hours. BNP (last 3 results) No results for input(s): BNP in the last 8760 hours. HbA1C: No results for input(s): HGBA1C in the last 72 hours. CBG: No results for input(s): GLUCAP in the last 168 hours. Lipid Profile: No results for input(s): CHOL, HDL, LDLCALC, TRIG, CHOLHDL, LDLDIRECT in the last 72 hours. Thyroid  Function Tests: No results for input(s): TSH, T4TOTAL, FREET4, T3FREE, THYROIDAB in the last 72 hours. Anemia Panel: No results for input(s): VITAMINB12, FOLATE, FERRITIN, TIBC, IRON, RETICCTPCT in the last 72 hours. Urine analysis:    Component Value Date/Time   APPEARANCEUR Cloudy (A) 09/26/2020 1346   GLUCOSEU Negative 09/26/2020 1346   BILIRUBINUR negative 03/20/2023 1602   BILIRUBINUR Negative 09/26/2020 1346   KETONESUR negative 03/20/2023 1602   PROTEINUR negative 03/20/2023 1602   PROTEINUR Trace (A) 09/26/2020 1346   UROBILINOGEN 0.2 03/20/2023 1602   NITRITE Negative 03/20/2023 1602   NITRITE Negative 09/26/2020 1346   LEUKOCYTESUR Negative 03/20/2023 1602    LEUKOCYTESUR Negative 09/26/2020 1346    Radiological Exams on Admission: I have personally reviewed images MR HIP LEFT WO CONTRAST Result Date: 07/24/2024 EXAM: MRI of the left HIP WITHOUT contrast. 07/24/2024 10:36:04 AM TECHNIQUE: Multiplanar multisequence MRI of the left hip was performed without the administration of intravenous contrast. COMPARISON: Plain film radiograph of the pelvis from 12/19/2024. CLINICAL HISTORY: Hip trauma, fracture suspected, no prior imaging. FINDINGS: BONE MARROW: Acute fracture involving the proximal left femur with disruption of the proximal femoral cortex with fracture line extending from the lateral subcapital femoral neck to the lesser trochanter as well as the intertrochanteric region. Marked surrounding bone marrow edema is identified. Previous open reduction and internal fixation of the right femur with intramedullary rod and cannulated screw in place with signal artifact. HIP JOINT: Small left hip joint effusion. No subchondral cysts or significant degenerative disease. SOFT TISSUES: Hemorrhages identified into the soft tissue surrounding the proximal left femur. No focal soft tissue mass. INTRAPELVIC CONTENTS: Limited images of the intrapelvic contents demonstrate no acute abnormality. IMPRESSION: 1. Acute, slightly impacted, subcapital left proximal femur fracture involving the femoral neck with intertrochanteric extension. Electronically signed by: Waddell Calk MD 07/24/2024 02:04 PM EST RP Workstation: [id number]   DG Femur Min 2 Views Left Result Date: 07/24/2024 CLINICAL DATA:  Left hip pain after fall EXAM: LEFT FEMUR 2 VIEWS COMPARISON:  None Available. FINDINGS: There is no evidence of fracture or other focal bone lesions. Soft tissues are unremarkable. IMPRESSION: Negative. Electronically Signed   By: Lynwood Landy Raddle M.D.   On: 07/24/2024 10:27   DG Pelvis 1-2 Views Result Date: 07/24/2024 CLINICAL DATA:  Pelvic pain after fall EXAM: PELVIS - 1-2 VIEW  COMPARISON:  None Available. FINDINGS: There is no evidence of acute pelvic fracture or diastasis. Status post surgical internal fixation of old proximal right femoral fracture. No pelvic bone lesions are seen. IMPRESSION: No acute abnormality seen. Electronically Signed   By: Lynwood Landy Raddle M.D.   On: 07/24/2024 10:23    EKG: My personal interpretation of EKG shows: Sinus bradycardia at 55 bpm, no ST elevation    Assessment/Plan Principal Problem:   Closed left hip fracture, initial encounter [facility]) Active Problems:   Hyperlipidemia   TIA (transient ischemic attack)   RLS (restless legs syndrome)  Assessment and Plan:  69 year old male with history of anxiety/depression, restless leg syndrome, history of TIA, hyperlipidemia who came into ED complaining of left hip pain after fall at home.  1.  Left femur subcapital intertrochanteric fracture - He will be admitted to hospital as inpatient. - Orthopedic team will see the patient and likely take him to the OR today. - He will be n.p.o., IV fluid, pain medications. - Further plans per patient's postoperative plan per orthopedic team - Patient will need DVT prophylaxis after the surgery  2.  Anxiety/depression - Resume his home citalopram and Wellbutrin   3.  Restless leg syndrome - Patient takes Robaxin  as needed.  4.  GERD - Resume home Protonix     DVT prophylaxis: Lovenox  Code Status: Full Code Family Communication: Wife was at bedside Disposition Plan: Home versus rehab Consults called: [facility] Dr. Ezra Admission status: Inpatient, Med-Surg   Nena Rebel, MD [facility] 07/24/2024, 3:10 PM        [1] No Known Allergies  "

## 2024-07-24 NOTE — Discharge Instructions (Signed)
 Return to the ER right away if you have severe worsening of pain, cold numb or weak leg or foot, develop fevers abdominal pain, or other concerns or symptoms arise.  No driving today.  Do not drive within 8 hours of use of hydrocodone  and use only as prescribed as this medication can cause fatigue, weakness, dizziness confusion etc.  Follow-up closely with your primary care doctor.  Diet: As you were doing prior to hospitalization   Shower:  May shower but keep the wounds dry, use an occlusive plastic wrap, NO SOAKING IN TUB.  If the bandage gets wet, change with a clean dry gauze.  Dressing:  You may change your dressing as needed. Change the dressing with sterile gauze dressing.    Activity:  Increase activity slowly as tolerated, but follow the weight bearing instructions below.  No lifting or driving for 6 weeks.  Weight Bearing:   Weight bearing as tolerated to left lower extremity  To prevent constipation: you may use a stool softener such as -  Colace (over the counter) 100 mg by mouth twice a day  Drink plenty of fluids (prune juice may be helpful) and high fiber foods Miralax  (over the counter) for constipation as needed.    Itching:  If you experience itching with your medications, try taking only a single pain pill, or even half a pain pill at a time.  You may take up to 10 pain pills per day, and you can also use benadryl over the counter for itching or also to help with sleep.   Precautions:  If you experience chest pain or shortness of breath - call 911 immediately for transfer to the hospital emergency department!!  If you develop a fever greater that 101 F, purulent drainage from wound, increased redness or drainage from wound, or calf pain-Call [facility]                                               Follow- Up Appointment:  Please call for an appointment to be seen in 2 weeks at [facility]

## 2024-07-25 DIAGNOSIS — S72002S Fracture of unspecified part of neck of left femur, sequela: Secondary | ICD-10-CM | POA: Diagnosis not present

## 2024-07-25 DIAGNOSIS — F419 Anxiety disorder, unspecified: Secondary | ICD-10-CM | POA: Diagnosis not present

## 2024-07-25 DIAGNOSIS — G2581 Restless legs syndrome: Secondary | ICD-10-CM

## 2024-07-25 DIAGNOSIS — G6289 Other specified polyneuropathies: Secondary | ICD-10-CM

## 2024-07-25 DIAGNOSIS — F32A Depression, unspecified: Secondary | ICD-10-CM | POA: Diagnosis not present

## 2024-07-25 DIAGNOSIS — I951 Orthostatic hypotension: Secondary | ICD-10-CM | POA: Diagnosis not present

## 2024-07-25 DIAGNOSIS — F418 Other specified anxiety disorders: Secondary | ICD-10-CM

## 2024-07-25 LAB — COMPREHENSIVE METABOLIC PANEL WITH GFR
ALT: 15 U/L (ref 0–44)
AST: 26 U/L (ref 15–41)
Albumin: 3.6 g/dL (ref 3.5–5.0)
Alkaline Phosphatase: 70 U/L (ref 38–126)
Anion gap: 9 (ref 5–15)
BUN: 18 mg/dL (ref 8–23)
CO2: 22 mmol/L (ref 22–32)
Calcium: 8.5 mg/dL — ABNORMAL LOW (ref 8.9–10.3)
Chloride: 105 mmol/L (ref 98–111)
Creatinine, Ser: 1.05 mg/dL (ref 0.61–1.24)
GFR, Estimated: >60 mL/min
Glucose, Bld: 166 mg/dL — ABNORMAL HIGH (ref 70–99)
Potassium: 4.5 mmol/L (ref 3.5–5.1)
Sodium: 136 mmol/L (ref 135–145)
Total Bilirubin: 0.9 mg/dL (ref 0.0–1.2)
Total Protein: 6.1 g/dL — ABNORMAL LOW (ref 6.5–8.1)

## 2024-07-25 LAB — CBC
HCT: 34 % — ABNORMAL LOW (ref 39.0–52.0)
Hemoglobin: 11.5 g/dL — ABNORMAL LOW (ref 13.0–17.0)
MCH: 31.4 pg (ref 26.0–34.0)
MCHC: 33.8 g/dL (ref 30.0–36.0)
MCV: 92.9 fL (ref 80.0–100.0)
Platelets: 188 K/uL (ref 150–400)
RBC: 3.66 MIL/uL — ABNORMAL LOW (ref 4.22–5.81)
RDW: 15.2 % (ref 11.5–15.5)
WBC: 11.5 K/uL — ABNORMAL HIGH (ref 4.0–10.5)
nRBC: 0 % (ref 0.0–0.2)

## 2024-07-25 LAB — PROTIME-INR
INR: 1.2 (ref 0.8–1.2)
Prothrombin Time: 15.6 s — ABNORMAL HIGH (ref 11.4–15.2)

## 2024-07-25 MED ORDER — ACETAMINOPHEN 325 MG PO TABS: 650.0000 mg | ORAL_TABLET | Freq: Four times a day (QID) | ORAL | Status: DC | PRN

## 2024-07-25 MED ORDER — OXYCODONE HCL 5 MG PO TABS: 5.0000 mg | ORAL_TABLET | ORAL | 0 refills | Status: DC | PRN

## 2024-07-25 MED ORDER — ENOXAPARIN SODIUM 40 MG/0.4ML IJ SOSY: 40.0000 mg | PREFILLED_SYRINGE | INTRAMUSCULAR | 0 refills | Status: DC

## 2024-07-25 MED ORDER — ALUM & MAG HYDROXIDE-SIMETH 200-200-20 MG/5ML PO SUSP
30.0000 mL | ORAL | Status: DC | PRN
  Administered 2024-07-25 (×2): 30 mL via ORAL
  Filled 2024-07-25 (×3): qty 30

## 2024-07-25 MED ORDER — OXYCODONE HCL 5 MG PO TABS
5.0000 mg | ORAL_TABLET | ORAL | Status: DC | PRN
  Administered 2024-07-25 – 2024-07-29 (×5): 5 mg via ORAL
  Filled 2024-07-25 (×6): qty 1

## 2024-07-25 MED ORDER — POLYETHYLENE GLYCOL 3350 17 G PO PACK
17.0000 g | PACK | Freq: Every day | ORAL | Status: DC
  Administered 2024-07-25 – 2024-07-29 (×5): 17 g via ORAL
  Filled 2024-07-25 (×5): qty 1

## 2024-07-25 NOTE — Progress Notes (Signed)
 " Progress Note   Patient: Jared [name] FMW:[id number] DOB: 1955/08/04 DOA: 07/24/2024     1 DOS: the patient was seen and examined on 07/25/2024   Brief hospital course: 69 y.o. male with medical history significant for anxiety/depression, GERD, concern for mild cognitive impairment, chronic peripheral neuropathy, pancreatic neuroendocrine tumor s/p surgery who came into ED after a fall at home this morning.  Patient and his wife stated that patient was trying to move stuffs at home.  He stated that he lost his balance and hung up on the floor and fell on the left side injuring his left hip and left elbow.  He denies any loss of consciousness or hitting head.  Due to severe pain on the left hip EMS was called. He denies any heart problem, diabetes, hypertension.  Denies any fever, chills, nausea, vomiting, chest pain, shortness of breath, palpitations.   ED Course: Upon arrival to the ED, patient is found to be in pain around left hip, normal CBC and BMP, EKG showed sinus bradycardia at 55 bpm, no ST elevation, x-ray of the left hip showed no acute fracture.  MRI of the left hip was done which showed left subcapital femoral neck fracture.  Orthopedic team was consulted and Dr. Ezra from orthopedic team will see the patient.  Hospitalist service was consulted for evaluation for admission.  1/4.  Intramedullary nailing of the left femur with cephalomedullary device done by Dr. Ezra 1/5.  Patient seen sitting in the chair.  Feels okay.  Offers no complaints.    Assessment and Plan: * Closed left hip fracture, sequela Left hip fracture status post intramedullary nail by Dr. Ezra on 1//26.  Pain control.  PT and OT evaluations.  Patient hoping to go home with home health rather than rehab  Anxiety and depression On Celexa and Wellbutrin   Peripheral neuropathy Patient wears leg braces  RLS (restless legs syndrome) And as needed Robaxin         Subjective: Patient seen sitting in the  chair.  Feels okay.  Hoping to go home and not to rehab.  Admitted after a fall and hip fracture.  Physical Exam: Vitals:   07/24/24 1951 07/25/24 0008 07/25/24 0512 07/25/24 0725  BP: 115/77 102/69 117/88 109/67  Pulse: 66 61 64 (!) 59  Resp: 17 18 18 16   Temp: 98.1 F (36.7 C) 98.1 F (36.7 C) 98.1 F (36.7 C) 98.6 F (37 C)  TempSrc:  Oral Oral   SpO2: 98% 98% 97% 95%  Weight:      Height:       Physical Exam HENT:     Head: Normocephalic.     Mouth/Throat:     Pharynx: No oropharyngeal exudate.  Eyes:     General: Lids are normal.     Conjunctiva/sclera: Conjunctivae normal.  Cardiovascular:     Rate and Rhythm: Normal rate and regular rhythm.     Heart sounds: Normal heart sounds, S1 normal and S2 normal.  Pulmonary:     Breath sounds: No decreased breath sounds, wheezing, rhonchi or rales.  Abdominal:     Palpations: Abdomen is soft.     Tenderness: There is no abdominal tenderness.  Musculoskeletal:     Right lower leg: Swelling present.     Left lower leg: Swelling present.  Skin:    General: Skin is warm.     Findings: No rash.  Neurological:     Mental Status: He is alert and oriented to person, place, and time.  Data Reviewed: Creatinine 1.05 with a GFR greater than 60, white blood cell count 11.5, hemoglobin 11.5, platelet count 188 Family Communication: Spoke with wife at bedside  Disposition: Status is: Inpatient Remains inpatient appropriate because: Postoperative day 1 for hip fracture  Planned Discharge Destination: Patient hoping to go home with home health    Time spent: 28 minutes  Author: Charlie Patterson, MD 07/25/2024 2:27 PM  For on call review [email or web address].  "

## 2024-07-25 NOTE — Hospital Course (Signed)
 69 y.o. male with medical history significant for anxiety/depression, GERD, concern for mild cognitive impairment, chronic peripheral neuropathy, pancreatic neuroendocrine tumor s/p surgery who came into ED after a fall at home this morning.  Patient and his wife stated that patient was trying to move stuffs at home.  He stated that he lost his balance and hung up on the floor and fell on the left side injuring his left hip and left elbow.  He denies any loss of consciousness or hitting head.  Due to severe pain on the left hip EMS was called. He denies any heart problem, diabetes, hypertension.  Denies any fever, chills, nausea, vomiting, chest pain, shortness of breath, palpitations.   ED Course: Upon arrival to the ED, patient is found to be in pain around left hip, normal CBC and BMP, EKG showed sinus bradycardia at 55 bpm, no ST elevation, x-ray of the left hip showed no acute fracture.  MRI of the left hip was done which showed left subcapital femoral neck fracture.  Orthopedic team was consulted and Dr. Ezra from orthopedic team will see the patient.  Hospitalist service was consulted for evaluation for admission.  1/4.  Intramedullary nailing of the left femur with cephalomedullary device done by Dr. Ezra 1/5.  Patient seen sitting in the chair.  Feels okay.  Offers no complaints.

## 2024-07-25 NOTE — Assessment & Plan Note (Addendum)
 Left hip fracture status post intramedullary nail by Dr. Ezra on 1//26.  Pain control.  PT and OT evaluations.  Patient hoping to go home with home health rather than rehab.  Today orthostatic.

## 2024-07-25 NOTE — Plan of Care (Signed)
" °  Problem: Urinary Elimination: Goal: Will remain free from infection Outcome: Progressing   Problem: Skin Integrity: Goal: Demonstrates signs of wound healing without infection Outcome: Progressing   Problem: Nutritional: Goal: Will attain and maintain optimal nutritional status Outcome: Progressing   Problem: Safety: Goal: Ability to remain free from injury will improve Outcome: Progressing   "

## 2024-07-25 NOTE — Plan of Care (Signed)
" °  Problem: Education: Goal: Knowledge of General Education information will improve Description: Including pain rating scale, medication(s)/side effects and non-pharmacologic comfort measures Outcome: Progressing   Problem: Clinical Measurements: Goal: Will remain free from infection Outcome: Progressing   Problem: Clinical Measurements: Goal: Diagnostic test results will improve Outcome: Progressing   Problem: Health Behavior/Discharge Planning: Goal: Ability to manage health-related needs will improve Outcome: Progressing   "

## 2024-07-25 NOTE — Evaluation (Signed)
 Physical Therapy Evaluation Patient Details Name: Jared [name] MRN: [id number] DOB: [birth year] Today's Date: 07/25/2024  History of Present Illness  Patient is a 69 year old male who sustained an intertrochanteric fracture after a fall. S/p IM nail left femur. PMH: bilateral lower extremity neuropathy and foot drop  Clinical Impression  Patient is agreeable to PT evaluation. Supportive spouse present for the session. The patient is Mod I at baseline, using bilateral AFOs for ambulation and a cane intermittently. He has a history of falls.   Today the session was coordinated after pain medications. He does have increased pain in the left hip with activity. Gait training initiated with rolling walker and bilateral AFOs in place. Standing activity tolerance is limited by fatigue, mild nausea and GERD symptoms also reported. He needs physical assistance for bed mobility and transfers today also. The patient will likely require intermittent physical assistance for safe transition home. The patient and spouse would like to work towards a home discharge with HHPT if possible. PT will continue to follow while in the hospital to maximize independence and decrease caregiver burden.       If plan is discharge home, recommend the following: A little help with walking and/or transfers;A little help with bathing/dressing/bathroom;Assist for transportation;Assistance with cooking/housework;Help with stairs or ramp for entrance   Can travel by private vehicle        Equipment Recommendations None recommended by PT  Recommendations for Other Services       Functional Status Assessment Patient has had a recent decline in their functional status and demonstrates the ability to make significant improvements in function in a reasonable and predictable amount of time.     Precautions / Restrictions Precautions Precautions: Fall Restrictions Weight Bearing Restrictions Per Provider Order: Yes LLE Weight  Bearing Per Provider Order: Weight bearing as tolerated      Mobility  Bed Mobility Overal bed mobility: Needs Assistance Bed Mobility: Sit to Supine       Sit to supine: Min assist   General bed mobility comments: assistance for LLE support. cues for technique.    Transfers Overall transfer level: Needs assistance Equipment used: Rolling walker (2 wheels) Transfers: Sit to/from Stand Sit to Stand: Mod assist           General transfer comment: patient required assistance to stand from recliner chair x 2. anticipate patient will require physical assistance for standing from normal height furniture due to height (6'6). cues for technique for pushing from recliner chair rather than pulling on the rolling walker    Ambulation/Gait Ambulation/Gait assistance: Contact guard assist Gait Distance (Feet): 12 Feet Assistive device: Rolling walker (2 wheels) (bilateral AFOs) Gait Pattern/deviations: Step-to pattern, Decreased stance time - left, Decreased stride length, Decreased dorsiflexion - left Gait velocity: decreased     General Gait Details: patient is able to clear bilateral feet with AFOs in place with swing phase of gait. cues for sequencing of BLE and rolling walker, as well as using rolling walker to off load the left leg for comfort. further walking limited by decreased activity tolerance, feelings of fatigue, nausea, and feeling hot.  Stairs            Wheelchair Mobility     Tilt Bed    Modified Rankin (Stroke Patients Only)       Balance Overall balance assessment: Needs assistance Sitting-balance support: Feet supported Sitting balance-Leahy Scale: Good     Standing balance support: Bilateral upper extremity supported Standing balance-Leahy Scale: Poor  Standing balance comment: with rolling walker for UE support in standing                             Pertinent Vitals/Pain Pain Assessment Pain Assessment: Faces Faces Pain  Scale: Hurts even more Pain Location: L hip with movement Pain Descriptors / Indicators: Discomfort Pain Intervention(s): Limited activity within patient's tolerance, Monitored during session, Repositioned    Home Living Family/patient expects to be discharged to:: Private residence Living Arrangements: Spouse/significant other Available Help at Discharge: Family;Available 24 hours/day Type of Home: House Home Access: Stairs to enter Entrance Stairs-Rails: Left Entrance Stairs-Number of Steps: 3   Home Layout: One level Home Equipment: Agricultural Consultant (2 wheels);Cane - single point;BSC/3in1;Shower seat Additional Comments: spouse is a engineer, civil (consulting) and very supportive    Prior Function Prior Level of Function : Independent/Modified Independent;History of Falls (last six months)             Mobility Comments: Using cane intermittently. Has bilateral foot drop and wears AFOs most of the time. History of falls       Extremity/Trunk Assessment   Upper Extremity Assessment Upper Extremity Assessment: Overall WFL for tasks assessed    Lower Extremity Assessment Lower Extremity Assessment: LLE deficits/detail;RLE deficits/detail RLE Deficits / Details: foot drop at baseline RLE Sensation: history of peripheral neuropathy LLE Deficits / Details: pain with weight bearing and AROM of L hip. foot drop at baseline. LLE Sensation: history of peripheral neuropathy       Communication   Communication Communication: No apparent difficulties    Cognition Arousal: Alert Behavior During Therapy: WFL for tasks assessed/performed   PT - Cognitive impairments: No apparent impairments                         Following commands: Intact       Cueing Cueing Techniques: Verbal cues, Visual cues     General Comments      Exercises     Assessment/Plan    PT Assessment Patient needs continued PT services  PT Problem List Decreased strength;Decreased range of motion;Decreased  activity tolerance;Decreased balance;Decreased mobility;Decreased knowledge of precautions;Pain       PT Treatment Interventions Gait training;DME instruction;Stair training;Functional mobility training;Therapeutic activities;Therapeutic exercise;Balance training;Neuromuscular re-education;Cognitive remediation;Patient/family education    PT Goals (Current goals can be found in the Care Plan section)  Acute Rehab PT Goals Patient Stated Goal: to go home if possible PT Goal Formulation: With patient/family Time For Goal Achievement: 08/08/24 Potential to Achieve Goals: Good    Frequency 7X/week     Co-evaluation               AM-PAC PT 6 Clicks Mobility  Outcome Measure Help needed turning from your back to your side while in a flat bed without using bedrails?: A Little Help needed moving from lying on your back to sitting on the side of a flat bed without using bedrails?: A Little Help needed moving to and from a bed to a chair (including a wheelchair)?: A Lot Help needed standing up from a chair using your arms (e.g., wheelchair or bedside chair)?: A Lot Help needed to walk in hospital room?: A Little Help needed climbing 3-5 steps with a railing? : A Little 6 Click Score: 16    End of Session Equipment Utilized During Treatment: Gait belt Activity Tolerance: Patient tolerated treatment well Patient left: in bed;with call bell/phone within reach;with bed  alarm set Nurse Communication: Mobility status (complains of GERD/burping) PT Visit Diagnosis: Difficulty in walking, not elsewhere classified (R26.2);Other abnormalities of gait and mobility (R26.89)    Time: 8956-8878 PT Time Calculation (min) (ACUTE ONLY): 38 min   Charges:   PT Evaluation $PT Eval Moderate Complexity: 1 Mod PT Treatments $Gait Training: 8-22 mins $Therapeutic Activity: 8-22 mins PT General Charges $$ ACUTE PT VISIT: 1 Visit        Randine Essex, PT, MPT  Randine LULLA Essex 07/25/2024,  11:43 AM

## 2024-07-25 NOTE — Assessment & Plan Note (Signed)
 Patient wears leg braces

## 2024-07-25 NOTE — Assessment & Plan Note (Signed)
 On Celexa  and Wellbutrin 

## 2024-07-25 NOTE — Assessment & Plan Note (Signed)
 And as needed Robaxin 

## 2024-07-25 NOTE — Progress Notes (Signed)
" ° °  Subjective: 1 Day Post-Op Procedures (LRB): FIXATION, FRACTURE, INTERTROCHANTERIC, WITH INTRAMEDULLARY ROD (Left) Patient reports pain as mild.   Patient is well, and has had no acute complaints or problems Denies any CP, SOB, ABD pain. We will continue therapy today.  Plan is to go Home after hospital stay.  Objective: Vital signs in last 24 hours: Temp:  [97.1 F (36.2 C)-98.6 F (37 C)] 98.6 F (37 C) (01/05 0725) Pulse Rate:  [55-95] 59 (01/05 0725) Resp:  [14-18] 16 (01/05 0725) BP: (102-160)/(67-112) 109/67 (01/05 0725) SpO2:  [93 %-100 %] 95 % (01/05 0725)  Intake/Output from previous day: 01/04 0701 - 01/05 0700 In: 1322.2 [I.V.:1022.2; IV Piggyback:300] Out: 450 [Urine:350; Blood:100] Intake/Output this shift: Total I/O In: -  Out: 400 [Urine:400]  Recent Labs    07/24/24 1030 07/24/24 2039 07/25/24 0551  HGB 14.3 13.0 11.5*   Recent Labs    07/24/24 2039 07/25/24 0551  WBC 12.8* 11.5*  RBC 4.12* 3.66*  HCT 38.1* 34.0*  PLT 212 188   Recent Labs    07/24/24 1030 07/24/24 2039 07/25/24 0551  NA 142  --  136  K 4.4  --  4.5  CL 105  --  105  CO2 25  --  22  BUN 16  --  18  CREATININE 1.08 1.02 1.05  GLUCOSE 102*  --  166*  CALCIUM 9.5  --  8.5*   Recent Labs    07/24/24 1030 07/25/24 0551  INR 1.0 1.2    EXAM General - Patient is Alert, Appropriate, and Oriented Extremity - Neurovascular intact Sensation intact distally Intact pulses distally No cellulitis present Compartment soft Dressing - dressing C/D/I and scant drainage   Past Medical History:  Diagnosis Date   Arthritis    Cancer (HCC)    pancreas   Complication of anesthesia    tends to come out of anesthesia during procedure   Depression    Dysphagia    Elevated lipids    Falls    GERD (gastroesophageal reflux disease)    H/O blood clots    superficial blood clot in left leg below knee tx w/ eliquis  x 3 mo   History of kidney stones    Kidney disease     Neuroendocrine tumor (HCC)    Subdural hematoma (HCC)    hx of   Tobacco abuse    Unsteadiness     Assessment/Plan:   1 Day Post-Op Procedures (LRB): FIXATION, FRACTURE, INTERTROCHANTERIC, WITH INTRAMEDULLARY ROD (Left) Principal Problem:   Closed left hip fracture, initial encounter (HCC) Active Problems:   Hyperlipidemia   TIA (transient ischemic attack)   RLS (restless legs syndrome)  Estimated body mass index is 29.37 kg/m as calculated from the following:   Height as of this encounter: 6' 6 (1.981 m).   Weight as of this encounter: 115.3 kg. Advance diet Up with therapy Pain well controlled Labs and VSS CM to assist with discharge. Patient and wife prefer home with HHPT.  Follow up with [facility] in 2 weeks Lovenox  40 mg SQ daily x 14 days at discharge  DVT Prophylaxis - Lovenox , TED hose, and SCDs Weight-Bearing as tolerated to Left leg   T. Medford Amber, PA-C [facility] 07/25/2024, 10:07 AM   "

## 2024-07-25 NOTE — TOC Progression Note (Signed)
 Transition of Care [facility]) - Progression Note    Patient Details  Name: Jared [name] MRN: [id number] Date of Birth: 12-31-1955  Transition of Care [facility]) CM/SW Contact  Alvaro Louder, [place] Phone Number: 07/25/2024, 4:22 PM  Clinical Narrative:   LCSWA met the patient at the bedside to discuss [facility] recommendation. Patient indicated that he wants to review agencies with his wife. TOC to follow up tomorrow.   TOC to follow for discharge                     Expected Discharge Plan and Services                                               Social Drivers of Health (SDOH) Interventions SDOH Screenings   Food Insecurity: No Food Insecurity (07/24/2024)  Housing: Low Risk (07/24/2024)  Transportation Needs: No Transportation Needs (07/24/2024)  Utilities: Not At Risk (07/24/2024)  Depression (PHQ2-9): Medium Risk (06/02/2024)  Financial Resource Strain: Low Risk  (05/17/2024)   Received from [facility]  Social Connections: Socially Integrated (07/24/2024)  Tobacco Use: Medium Risk (07/24/2024)    Readmission Risk Interventions     No data to display

## 2024-07-26 DIAGNOSIS — I951 Orthostatic hypotension: Secondary | ICD-10-CM

## 2024-07-26 DIAGNOSIS — F419 Anxiety disorder, unspecified: Secondary | ICD-10-CM | POA: Diagnosis not present

## 2024-07-26 DIAGNOSIS — G2581 Restless legs syndrome: Secondary | ICD-10-CM | POA: Diagnosis not present

## 2024-07-26 DIAGNOSIS — S72002S Fracture of unspecified part of neck of left femur, sequela: Secondary | ICD-10-CM | POA: Diagnosis not present

## 2024-07-26 LAB — HEMOGLOBIN: Hemoglobin: 10.9 g/dL — ABNORMAL LOW (ref 13.0–17.0)

## 2024-07-26 LAB — GLUCOSE, CAPILLARY: Glucose-Capillary: 94 mg/dL (ref 70–99)

## 2024-07-26 MED ORDER — SODIUM CHLORIDE 0.9 % IV BOLUS
500.0000 mL | Freq: Once | INTRAVENOUS | Status: AC
  Administered 2024-07-26: 500 mL via INTRAVENOUS

## 2024-07-26 MED ORDER — MIDODRINE HCL 5 MG PO TABS
5.0000 mg | ORAL_TABLET | Freq: Three times a day (TID) | ORAL | Status: DC
  Administered 2024-07-26 – 2024-07-29 (×9): 5 mg via ORAL
  Filled 2024-07-26 (×9): qty 1

## 2024-07-26 MED ORDER — LACTULOSE 10 GM/15ML PO SOLN
30.0000 g | Freq: Two times a day (BID) | ORAL | Status: DC
  Administered 2024-07-26 – 2024-07-27 (×2): 30 g via ORAL
  Filled 2024-07-26 (×2): qty 60

## 2024-07-26 MED ORDER — LACTULOSE 10 GM/15ML PO SOLN
20.0000 g | Freq: Every day | ORAL | Status: DC
  Administered 2024-07-26: 20 g via ORAL
  Filled 2024-07-26: qty 30

## 2024-07-26 MED ORDER — SODIUM CHLORIDE 0.9 % IV SOLN: INTRAVENOUS | Status: DC

## 2024-07-26 NOTE — Progress Notes (Signed)
 " Progress Note   Patient: Jared [name] FMW:[id number] DOB: [birth year] DOA: 07/24/2024     2 DOS: the patient was seen and examined on 07/26/2024   Brief hospital course: 69 y.o. male with medical history significant for anxiety/depression, GERD, concern for mild cognitive impairment, chronic peripheral neuropathy, pancreatic neuroendocrine tumor s/p surgery who came into ED after a fall at home this morning.  Patient and his wife stated that patient was trying to move stuffs at home.  He stated that he lost his balance and hung up on the floor and fell on the left side injuring his left hip and left elbow.  He denies any loss of consciousness or hitting head.  Due to severe pain on the left hip EMS was called. He denies any heart problem, diabetes, hypertension.  Denies any fever, chills, nausea, vomiting, chest pain, shortness of breath, palpitations.   ED Course: Upon arrival to the ED, patient is found to be in pain around left hip, normal CBC and BMP, EKG showed sinus bradycardia at 55 bpm, no ST elevation, x-ray of the left hip showed no acute fracture.  MRI of the left hip was done which showed left subcapital femoral neck fracture.  Orthopedic team was consulted and Dr. Ezra from orthopedic team will see the patient.  Hospitalist service was consulted for evaluation for admission.  1/4.  Intramedullary nailing of the left femur with cephalomedullary device done by Dr. Ezra 1/5.  Patient seen sitting in the chair.  Feels okay.  Offers no complaints.    Assessment and Plan: * Orthostatic hypotension Today's hemoglobin 10.9.  On presentation 14.3.  Will have to give fluid bolus.  Continue to watch hemoglobin.  May end up needing midodrine   Closed left hip fracture, sequela Left hip fracture status post intramedullary nail by Dr. Ezra on 1//26.  Pain control.  PT and OT evaluations.  Patient hoping to go home with home health rather than rehab.  Today orthostatic.  Anxiety and  depression On Celexa and Wellbutrin   Peripheral neuropathy Patient wears leg braces  RLS (restless legs syndrome) And as needed Robaxin         Subjective: Patient seen this morning.  Felt well.  Messaged by physical therapy that he was orthostatic with standing.  Patient stated he had some cramping in his legs last night and took a Robaxin .  Physical Exam: Vitals:   07/25/24 1457 07/25/24 2004 07/26/24 0430 07/26/24 0756  BP: 126/75 132/73 130/74 135/81  Pulse: 69 70 65 61  Resp: 16 17 17 18   Temp: 98.8 F (37.1 C) 98.7 F (37.1 C) 98.5 F (36.9 C) 98.7 F (37.1 C)  TempSrc:  Oral Oral Oral  SpO2: 92% 93% 94% 97%  Weight:      Height:       Physical Exam HENT:     Head: Normocephalic.     Mouth/Throat:     Pharynx: No oropharyngeal exudate.  Eyes:     General: Lids are normal.     Conjunctiva/sclera: Conjunctivae normal.  Cardiovascular:     Rate and Rhythm: Normal rate and regular rhythm.     Heart sounds: Normal heart sounds, S1 normal and S2 normal.  Pulmonary:     Breath sounds: No decreased breath sounds, wheezing, rhonchi or rales.  Abdominal:     Palpations: Abdomen is soft.     Tenderness: There is no abdominal tenderness.  Musculoskeletal:     Right lower leg: Swelling present.     Left  lower leg: Swelling present.  Skin:    General: Skin is warm.     Findings: No rash.  Neurological:     Mental Status: He is alert and oriented to person, place, and time.     Data Reviewed: Creatinine 1.05, electrolytes normal range, hemoglobin 10.9  Family Communication: Left message for wife  Disposition: Status is: Inpatient Remains inpatient appropriate because: Patient orthostatic today.  Will give fluid bolus.  May have to start midodrine .  Planned Discharge Destination: Patient wanting to go home with home health    Time spent: 28 minutes  Author: Charlie Patterson, MD 07/26/2024 12:38 PM  For on call review [email or web address].  "

## 2024-07-26 NOTE — Progress Notes (Signed)
 Physical Therapy Treatment Patient Details Name: Jared [name] MRN: [id number] DOB: Nov 06, 1955 Today's Date: 07/26/2024   History of Present Illness Patient is a 69 year old male who sustained an intertrochanteric fracture after a fall. S/p IM nail left femur. PMH: bilateral lower extremity neuropathy and foot drop    PT Comments  Pt received edge of bed, wife assisting him with clothing. Pt nauseous and vomiting due to dizziness. Pt stood from bed once able with increased dizziness and nausea. Assisted back to sitting to obtain BP in sitting and standing with Orthostatic Hypotension results. Pt agreed to transfer to bedside chair with LE's elevated. MD to order IV bolus. Currently pt has not made significant functional progress due to BP dropping. May need STR if tomorrow's session has limited progression.   If plan is discharge home, recommend the following: A little help with walking and/or transfers;A little help with bathing/dressing/bathroom;Assist for transportation;Assistance with cooking/housework;Help with stairs or ramp for entrance   Can travel by private vehicle        Equipment Recommendations  Other (comment) (TBD, may need to change recs to STR.)    Recommendations for Other Services       Precautions / Restrictions Precautions Precautions: Fall Recall of Precautions/Restrictions: Intact Restrictions Weight Bearing Restrictions Per Provider Order: Yes LLE Weight Bearing Per Provider Order: Weight bearing as tolerated Other Position/Activity Restrictions: L Hip ORIF     Mobility  Bed Mobility Overal bed mobility: Needs Assistance Bed Mobility: Supine to Sit       Sit to supine: Min assist, Used rails   General bed mobility comments: c/o nausea +vomiting upon sitting EOB due to dizziness    Transfers Overall transfer level: Needs assistance Equipment used: Rolling walker (2 wheels) Transfers: Sit to/from Stand Sit to Stand: Mod assist, From elevated  surface           General transfer comment:  (Unable to stand on first attempt, cues needed for hand placement)    Ambulation/Gait Ambulation/Gait assistance: Contact guard assist Gait Distance (Feet):  (4) Assistive device: Rolling walker (2 wheels) Gait Pattern/deviations: Step-to pattern, Decreased stance time - left, Decreased stride length, Decreased dorsiflexion - left Gait velocity: decreased     General Gait Details:  (Limited gait tolerance due to Orthostatic Hypotension)   Stairs             Wheelchair Mobility     Tilt Bed    Modified Rankin (Stroke Patients Only)       Balance Overall balance assessment: Needs assistance Sitting-balance support: Feet supported Sitting balance-Leahy Scale: Good     Standing balance support: Bilateral upper extremity supported Standing balance-Leahy Scale: Poor Standing balance comment: with rolling walker for UE support in standing                            Communication Communication Communication: Impaired Factors Affecting Communication: Hearing impaired  Cognition Arousal: Alert Behavior During Therapy: WFL for tasks assessed/performed   PT - Cognitive impairments: No apparent impairments                         Following commands: Intact      Cueing Cueing Techniques: Verbal cues, Visual cues  Exercises      General Comments General comments (skin integrity, edema, etc.):  (Orthostatic Hypotension affecting progression Seated BP116/74, HR 66,  Standing after 30 seconds 83/47, HR 90)  Pertinent Vitals/Pain Pain Assessment Pain Assessment: Faces Faces Pain Scale: Hurts even more Pain Location: L hip with movement Pain Descriptors / Indicators: Discomfort Pain Intervention(s): Monitored during session, Premedicated before session    Home Living                          Prior Function            PT Goals (current goals can now be found in the care  plan section) Acute Rehab PT Goals Patient Stated Goal: to go home if possible    Frequency    7X/week      PT Plan      Co-evaluation              AM-PAC PT 6 Clicks Mobility   Outcome Measure  Help needed turning from your back to your side while in a flat bed without using bedrails?: A Lot Help needed moving from lying on your back to sitting on the side of a flat bed without using bedrails?: A Little Help needed moving to and from a bed to a chair (including a wheelchair)?: A Lot Help needed standing up from a chair using your arms (e.g., wheelchair or bedside chair)?: A Lot Help needed to walk in hospital room?: A Little Help needed climbing 3-5 steps with a railing? : A Little 6 Click Score: 15    End of Session Equipment Utilized During Treatment: Gait belt Activity Tolerance: Treatment limited secondary to medical complications (Comment) (Orthostatic Hypotension) Patient left: in chair;with call bell/phone within reach;with family/visitor present Nurse Communication: Mobility status;Other (comment) (Low BP) PT Visit Diagnosis: Difficulty in walking, not elsewhere classified (R26.2);Other abnormalities of gait and mobility (R26.89)     Time: 1111-1150 PT Time Calculation (min) (ACUTE ONLY): 39 min  Charges:    $Gait Training: 8-22 mins $Therapeutic Activity: 23-37 mins PT General Charges $$ ACUTE PT VISIT: 1 Visit                    [name], PTA  [name] 07/26/2024, 3:14 PM

## 2024-07-26 NOTE — Assessment & Plan Note (Signed)
 Today's hemoglobin 10.9.  On presentation 14.3.  Will have to give fluid bolus.  Continue to watch hemoglobin.  May end up needing midodrine 

## 2024-07-26 NOTE — Plan of Care (Signed)
   Problem: Activity: Goal: Risk for activity intolerance will decrease Outcome: Progressing   Problem: Nutrition: Goal: Adequate nutrition will be maintained Outcome: Progressing   Problem: Elimination: Goal: Will not experience complications related to bowel motility Outcome: Progressing   Problem: Skin Integrity: Goal: Risk for impaired skin integrity will decrease Outcome: Progressing

## 2024-07-26 NOTE — Progress Notes (Addendum)
 Subjective: 2 Days Post-Op Procedures (LRB): FIXATION, FRACTURE, INTERTROCHANTERIC, WITH INTRAMEDULLARY ROD (Left) Patient reports pain as mild in the left hip this morning. Patient is well, and has had no acute complaints or problems Denies any CP, SOB, ABD pain. We will continue therapy today.  Plan is to go Home after hospital stay. Has not had a BM yet.  Objective: Vital signs in last 24 hours: Temp:  [98.5 F (36.9 C)-98.8 F (37.1 C)] 98.5 F (36.9 C) (01/06 0430) Pulse Rate:  [65-70] 65 (01/06 0430) Resp:  [16-17] 17 (01/06 0430) BP: (126-132)/(73-75) 130/74 (01/06 0430) SpO2:  [92 %-94 %] 94 % (01/06 0430)  Intake/Output from previous day: 01/05 0701 - 01/06 0700 In: 480 [P.O.:480] Out: 900 [Urine:900] Intake/Output this shift: No intake/output data recorded.  Recent Labs    07/24/24 1030 07/24/24 2039 07/25/24 0551  HGB 14.3 13.0 11.5*   Recent Labs    07/24/24 2039 07/25/24 0551  WBC 12.8* 11.5*  RBC 4.12* 3.66*  HCT 38.1* 34.0*  PLT 212 188   Recent Labs    07/24/24 1030 07/24/24 2039 07/25/24 0551  NA 142  --  136  K 4.4  --  4.5  CL 105  --  105  CO2 25  --  22  BUN 16  --  18  CREATININE 1.08 1.02 1.05  GLUCOSE 102*  --  166*  CALCIUM 9.5  --  8.5*   Recent Labs    07/24/24 1030 07/25/24 0551  INR 1.0 1.2    EXAM General - Patient is Alert, Appropriate, and Oriented Extremity - Neurovascular intact Sensation intact distally Intact pulses distally No cellulitis present Compartment soft Dressing - dressing C/D/I and scant drainage Left thigh soft to palpation. Intact bowel sounds, mild distention.   Past Medical History:  Diagnosis Date   Arthritis    Cancer (HCC)    pancreas   Complication of anesthesia    tends to come out of anesthesia during procedure   Depression    Dysphagia    Elevated lipids    Falls    GERD (gastroesophageal reflux disease)    H/O blood clots    superficial blood clot in left leg below knee  tx w/ eliquis  x 3 mo   History of kidney stones    Kidney disease    Neuroendocrine tumor (HCC)    Subdural hematoma (HCC)    hx of   Tobacco abuse    Unsteadiness     Assessment/Plan:   2 Days Post-Op Procedures (LRB): FIXATION, FRACTURE, INTERTROCHANTERIC, WITH INTRAMEDULLARY ROD (Left) Principal Problem:   Closed left hip fracture, sequela Active Problems:   Hyperlipidemia   TIA (transient ischemic attack)   RLS (restless legs syndrome)   Peripheral neuropathy   Anxiety and depression  Estimated body mass index is 29.37 kg/m as calculated from the following:   Height as of this encounter: 6' 6 (1.981 m).   Weight as of this encounter: 115.3 kg. Advance diet Up with therapy  Pain well controlled Labs and VSS.  WBC down to 11.5, Hg 11.5 CM to assist with discharge. Patient and wife prefer home with HHPT. Up with therapy today.   Continue to work on a BM. Pain medication and Lovenox  printed and in patient's chart.  Follow up with [facility] in 2 weeks Lovenox  40 mg SQ daily x 14 days at discharge  DVT Prophylaxis - Lovenox , TED hose, and SCDs Weight-Bearing as tolerated to Left leg  J. Gustavo Level,  PA-C [facility] 07/26/2024, 7:36 AM

## 2024-07-26 NOTE — Plan of Care (Signed)
   Problem: Health Behavior/Discharge Planning: Goal: Ability to manage health-related needs will improve Outcome: Progressing   Problem: Clinical Measurements: Goal: Will remain free from infection Outcome: Progressing   Problem: Nutrition: Goal: Adequate nutrition will be maintained Outcome: Progressing   Problem: Coping: Goal: Level of anxiety will decrease Outcome: Progressing

## 2024-07-27 DIAGNOSIS — I951 Orthostatic hypotension: Secondary | ICD-10-CM | POA: Diagnosis not present

## 2024-07-27 LAB — BASIC METABOLIC PANEL WITH GFR
Anion gap: 9 (ref 5–15)
BUN: 14 mg/dL (ref 8–23)
CO2: 26 mmol/L (ref 22–32)
Calcium: 8.7 mg/dL — ABNORMAL LOW (ref 8.9–10.3)
Chloride: 106 mmol/L (ref 98–111)
Creatinine, Ser: 0.98 mg/dL (ref 0.61–1.24)
GFR, Estimated: >60 mL/min
Glucose, Bld: 103 mg/dL — ABNORMAL HIGH (ref 70–99)
Potassium: 4.2 mmol/L (ref 3.5–5.1)
Sodium: 140 mmol/L (ref 135–145)

## 2024-07-27 LAB — CBC
HCT: 30.4 % — ABNORMAL LOW (ref 39.0–52.0)
Hemoglobin: 10.2 g/dL — ABNORMAL LOW (ref 13.0–17.0)
MCH: 31.6 pg (ref 26.0–34.0)
MCHC: 33.6 g/dL (ref 30.0–36.0)
MCV: 94.1 fL (ref 80.0–100.0)
Platelets: 182 K/uL (ref 150–400)
RBC: 3.23 MIL/uL — ABNORMAL LOW (ref 4.22–5.81)
RDW: 15.4 % (ref 11.5–15.5)
WBC: 11.3 K/uL — ABNORMAL HIGH (ref 4.0–10.5)
nRBC: 0 % (ref 0.0–0.2)

## 2024-07-27 NOTE — Progress Notes (Signed)
" °  Progress Note   Patient: Jared [name] FMW:[id number] DOB: 1956/01/01 DOA: 07/24/2024     3 DOS: the patient was seen and examined on 07/27/2024   Brief hospital course: 69 y.o. male with medical history significant for anxiety/depression, GERD, concern for mild cognitive impairment, chronic peripheral neuropathy, pancreatic neuroendocrine tumor s/p surgery who came into ED after a fall at home this morning.  Patient and his wife stated that patient was trying to move stuffs at home.  He stated that he lost his balance and hung up on the floor and fell on the left side injuring his left hip and left elbow.  He denies any loss of consciousness or hitting head.  Due to severe pain on the left hip EMS was called. MRI of the left hip was done which showed left subcapital femoral neck fracture, Hospital course as below  Assessment and Plan: Orthostatic hypotension S/p IV fluids Hb dropped from 14.3 on presentation -> 10.2, no acute evidence of bleed. ? dilutional On midodrine  5 mg TID, compression stocking Monitor Hb, repeat orthostatics tomorrow   Closed left hip fracture, sequela Left hip fracture status post intramedullary nail by Dr. Ezra on 1//26.  Pain control.   PT and OT evaluations.  Patient hoping to go home with home health rather than rehab Lovenox  SQ x 14 days   Anxiety and depression On Celexa and Wellbutrin    Peripheral neuropathy Patient wears leg braces   RLS (restless legs syndrome) And as needed Robaxin   Constipation -resolved Had BM today, continue MiraLAX  Discontinue lactulose     Subjective: Patient seen this morning.  Continues to feel dizzy and orthostatics positive Has compression stockings, on midodrine  5 mg tid Will check orthostatic vitals tomorrow  Physical Exam: Vitals:   07/26/24 1604 07/26/24 1951 07/27/24 0412 07/27/24 0747  BP: 131/84 129/75 124/73 130/82  Pulse: 79 68 63 62  Resp:  20 16 18   Temp: 99.5 F (37.5 C) 98.6 F (37 C) 98.7 F  (37.1 C) 98.5 F (36.9 C)  TempSrc: Oral     SpO2:  91% 92% 96%  Weight:      Height:       Physical Exam HENT:     Head: Normocephalic.     Mouth/Throat:     Pharynx: No oropharyngeal exudate.  Eyes:     General: Lids are normal.     Conjunctiva/sclera: Conjunctivae normal.  Cardiovascular:     Rate and Rhythm: Normal rate and regular rhythm.     Heart sounds: Normal heart sounds, S1 normal and S2 normal.  Pulmonary:     Breath sounds: No decreased breath sounds, wheezing, rhonchi or rales.  Abdominal:     Palpations: Abdomen is soft.     Tenderness: There is no abdominal tenderness.  Musculoskeletal:     Right lower leg: Swelling present.     Left lower leg: Swelling present.  Skin:    General: Skin is warm.     Findings: No rash.  Neurological:     Mental Status: He is alert and oriented to person, place, and time.     Data Reviewed: Labs and Imaging reviewed  Family Communication: None at the bedside  Disposition: Status is: Inpatient Remains inpatient appropriate because: Patient orthostatic today.  On Midodrine   Planned Discharge Destination: Patient wanting to go home with home health    Time spent: 36 minutes  Author: Laree Lock, MD 07/27/2024 9:21 AM  For on call review [email or web address].  "

## 2024-07-27 NOTE — Plan of Care (Signed)

## 2024-07-27 NOTE — Plan of Care (Signed)
  Problem: Education: Goal: Knowledge of General Education information will improve Description: Including pain rating scale, medication(s)/side effects and non-pharmacologic comfort measures Outcome: Progressing   Problem: Clinical Measurements: Goal: Diagnostic test results will improve Outcome: Progressing   Problem: Activity: Goal: Risk for activity intolerance will decrease Outcome: Progressing   Problem: Coping: Goal: Level of anxiety will decrease Outcome: Progressing   Problem: Pain Managment: Goal: General experience of comfort will improve and/or be controlled Outcome: Progressing   Problem: Safety: Goal: Ability to remain free from injury will improve Outcome: Progressing

## 2024-07-27 NOTE — Care Management Important Message (Signed)
 Important Message  Patient Details  Name: Jared [name] MRN: [id number] Date of Birth: 04-17-1956   Important Message Given:  Yes - Medicare IM     Letesha Klecker W, CMA 07/27/2024, 10:06 AM

## 2024-07-27 NOTE — Progress Notes (Signed)
 Subjective: 3 Days Post-Op Procedures (LRB): FIXATION, FRACTURE, INTERTROCHANTERIC, WITH INTRAMEDULLARY ROD (Left) Patient reports pain as mild in the left hip this morning. Patient is well, and has had no acute complaints or problems Became orthostatic with PT yesterday.  Currently receiving IV fluid bolus. Denies any CP, SOB, ABD pain. We will continue therapy today.  Plan is to go Home after hospital stay.   Objective: Vital signs in last 24 hours: Temp:  [98.5 F (36.9 C)-99.5 F (37.5 C)] 98.5 F (36.9 C) (01/07 0747) Pulse Rate:  [61-88] 62 (01/07 0747) Resp:  [16-20] 18 (01/07 0747) BP: (124-137)/(72-84) 130/82 (01/07 0747) SpO2:  [91 %-97 %] 96 % (01/07 0747)  Intake/Output from previous day: 01/06 0701 - 01/07 0700 In: 568.5 [P.O.:240; I.V.:328.5] Out: 1150 [Urine:1150] Intake/Output this shift: No intake/output data recorded.  Recent Labs    07/24/24 1030 07/24/24 2039 07/25/24 0551 07/26/24 0727 07/27/24 0526  HGB 14.3 13.0 11.5* 10.9* 10.2*   Recent Labs    07/25/24 0551 07/27/24 0526  WBC 11.5* 11.3*  RBC 3.66* 3.23*  HCT 34.0* 30.4*  PLT 188 182   Recent Labs    07/25/24 0551 07/27/24 0526  NA 136 140  K 4.5 4.2  CL 105 106  CO2 22 26  BUN 18 14  CREATININE 1.05 0.98  GLUCOSE 166* 103*  CALCIUM 8.5* 8.7*   Recent Labs    07/24/24 1030 07/25/24 0551  INR 1.0 1.2    EXAM General - Patient is Alert, Appropriate, and Oriented Extremity - Neurovascular intact Sensation intact distally Intact pulses distally No cellulitis present Compartment soft Dressing - Mild bloody drainaged noted to honeycomb dressings. Left thigh soft to palpation. Intact bowel sounds, improved distention.   Past Medical History:  Diagnosis Date   Arthritis    Cancer (HCC)    pancreas   Complication of anesthesia    tends to come out of anesthesia during procedure   Depression    Dysphagia    Elevated lipids    Falls    GERD (gastroesophageal  reflux disease)    H/O blood clots    superficial blood clot in left leg below knee tx w/ eliquis  x 3 mo   History of kidney stones    Kidney disease    Neuroendocrine tumor (HCC)    Subdural hematoma (HCC)    hx of   Tobacco abuse    Unsteadiness     Assessment/Plan:   3 Days Post-Op Procedures (LRB): FIXATION, FRACTURE, INTERTROCHANTERIC, WITH INTRAMEDULLARY ROD (Left) Principal Problem:   Orthostatic hypotension Active Problems:   Hyperlipidemia   TIA (transient ischemic attack)   RLS (restless legs syndrome)   Peripheral neuropathy   Closed left hip fracture, sequela   Anxiety and depression  Estimated body mass index is 29.37 kg/m as calculated from the following:   Height as of this encounter: 6' 6 (1.981 m).   Weight as of this encounter: 115.3 kg. Advance diet Up with therapy  Pain well controlled Labs and VSS.  WBC down to 11.3, Hg 10.2 CM to assist with discharge. Patient and wife prefer home with HHPT. Up with therapy today.   Patient has had a BM. Pain medication and Lovenox  printed and in patient's chart.  Follow up with [facility] in 2 weeks Lovenox  40 mg SQ daily x 14 days at discharge  DVT Prophylaxis - Lovenox , TED hose, and SCDs Weight-Bearing as tolerated to Left leg  J. Gustavo Level, PA-C [facility] 07/27/2024, 7:54  AM

## 2024-07-27 NOTE — Progress Notes (Signed)
 Physical Therapy Treatment Patient Details Name: Jared [name] MRN: [id number] DOB: April 02, 1956 Today's Date: 07/27/2024   History of Present Illness Patient is a 69 year old male who sustained an intertrochanteric fracture after a fall. S/p IM nail left femur. PMH: bilateral lower extremity neuropathy and foot drop    PT Comments  Ongoing challenge progressing mobility due to Orthostatic Hypotension x two days now despite receiving fluid bolus and 2 doses of Midodrine . Pt unable to tolerate >30seconds in static standing before having significant dizziness and nausea. Will continue to progress as tolerated. MD ordered TED hose for next session.    If plan is discharge home, recommend the following: A little help with walking and/or transfers;A little help with bathing/dressing/bathroom;Assist for transportation;Assistance with cooking/housework;Help with stairs or ramp for entrance   Can travel by private vehicle        Equipment Recommendations  Other (comment) (TBD, may need STR if can't progress)    Recommendations for Other Services       Precautions / Restrictions Precautions Precautions: Fall Recall of Precautions/Restrictions: Intact Restrictions Weight Bearing Restrictions Per Provider Order: Yes LLE Weight Bearing Per Provider Order: Weight bearing as tolerated Other Position/Activity Restrictions: L Hip ORIF     Mobility  Bed Mobility               General bed mobility comments:  (N/T)    Transfers Overall transfer level: Needs assistance Equipment used: Rolling walker (2 wheels) Transfers: Sit to/from Stand Sit to Stand: Min assist, From elevated surface           General transfer comment:  (30 second standing tolerance due to drop in BP and symptomatic)    Ambulation/Gait               General Gait Details:  (Unable due to Orthostatic Hypotension)   Stairs             Wheelchair Mobility     Tilt Bed    Modified Rankin  (Stroke Patients Only)       Balance Overall balance assessment: Needs assistance Sitting-balance support: Feet supported Sitting balance-Leahy Scale: Good     Standing balance support: Bilateral upper extremity supported, Reliant on assistive device for balance, During functional activity Standing balance-Leahy Scale: Poor Standing balance comment: with rolling walker for UE support in standing                            Communication Communication Communication: Impaired Factors Affecting Communication: Hearing impaired  Cognition Arousal: Alert Behavior During Therapy: WFL for tasks assessed/performed   PT - Cognitive impairments: No apparent impairments                         Following commands: Intact      Cueing Cueing Techniques: Verbal cues, Visual cues  Exercises      General Comments General comments (skin integrity, edema, etc.): Pt received 2 doses of Midodrine  starting at 6pm yesterday without change in BP      Pertinent Vitals/Pain Pain Assessment Pain Assessment: Faces Faces Pain Scale: Hurts even more Pain Location: L hip with movement Pain Descriptors / Indicators: Discomfort Pain Intervention(s): Premedicated before session    Home Living                          Prior Function  PT Goals (current goals can now be found in the care plan section) Acute Rehab PT Goals Patient Stated Goal: to go home if possible    Frequency    7X/week      PT Plan      Co-evaluation              AM-PAC PT 6 Clicks Mobility   Outcome Measure  Help needed turning from your back to your side while in a flat bed without using bedrails?: A Lot Help needed moving from lying on your back to sitting on the side of a flat bed without using bedrails?: A Little Help needed moving to and from a bed to a chair (including a wheelchair)?: A Lot Help needed standing up from a chair using your arms (e.g.,  wheelchair or bedside chair)?: A Lot Help needed to walk in hospital room?: A Little Help needed climbing 3-5 steps with a railing? : A Little 6 Click Score: 15    End of Session Equipment Utilized During Treatment: Gait belt Activity Tolerance: Treatment limited secondary to medical complications (Comment) Patient left: in chair;with call bell/phone within reach;with family/visitor present Nurse Communication: Mobility status;Other (comment) (Low BP) PT Visit Diagnosis: Difficulty in walking, not elsewhere classified (R26.2);Other abnormalities of gait and mobility (R26.89)     Time: 8947-8878 PT Time Calculation (min) (ACUTE ONLY): 29 min  Charges:    $Therapeutic Activity: 23-37 mins PT General Charges $$ ACUTE PT VISIT: 1 Visit                    [name], PTA  [name] 07/27/2024, 12:49 PM

## 2024-07-28 DIAGNOSIS — I951 Orthostatic hypotension: Secondary | ICD-10-CM | POA: Diagnosis not present

## 2024-07-28 LAB — BASIC METABOLIC PANEL WITH GFR
Anion gap: 7 (ref 5–15)
BUN: 15 mg/dL (ref 8–23)
CO2: 27 mmol/L (ref 22–32)
Calcium: 8.9 mg/dL (ref 8.9–10.3)
Chloride: 106 mmol/L (ref 98–111)
Creatinine, Ser: 0.95 mg/dL (ref 0.61–1.24)
GFR, Estimated: >60 mL/min
Glucose, Bld: 107 mg/dL — ABNORMAL HIGH (ref 70–99)
Potassium: 3.9 mmol/L (ref 3.5–5.1)
Sodium: 139 mmol/L (ref 135–145)

## 2024-07-28 LAB — CBC
HCT: 29.4 % — ABNORMAL LOW (ref 39.0–52.0)
Hemoglobin: 10 g/dL — ABNORMAL LOW (ref 13.0–17.0)
MCH: 31.9 pg (ref 26.0–34.0)
MCHC: 34 g/dL (ref 30.0–36.0)
MCV: 93.9 fL (ref 80.0–100.0)
Platelets: 214 K/uL (ref 150–400)
RBC: 3.13 MIL/uL — ABNORMAL LOW (ref 4.22–5.81)
RDW: 15.5 % (ref 11.5–15.5)
WBC: 9.4 K/uL (ref 4.0–10.5)
nRBC: 0 % (ref 0.0–0.2)

## 2024-07-28 MED ORDER — ASPIRIN 325 MG PO TABS
325.0000 mg | ORAL_TABLET | Freq: Every day | ORAL | Status: DC
  Administered 2024-07-29: 325 mg via ORAL
  Filled 2024-07-28: qty 1

## 2024-07-28 MED ORDER — CEPHALEXIN 500 MG PO CAPS
500.0000 mg | ORAL_CAPSULE | Freq: Four times a day (QID) | ORAL | Status: DC
  Administered 2024-07-28 – 2024-07-29 (×4): 500 mg via ORAL
  Filled 2024-07-28 (×4): qty 1

## 2024-07-28 NOTE — Progress Notes (Addendum)
 Subjective: 4 Days Post-Op Procedures (LRB): FIXATION, FRACTURE, INTERTROCHANTERIC, WITH INTRAMEDULLARY ROD (Left) Patient reports pain as mild in the left hip this morning. Patient is well, and has had no acute complaints or problems Became orthostatic with PT, continuing to monitor.  Currently receiving IV fluid bolus. Denies any CP, SOB, ABD pain. We will continue therapy today.  Plan is to go Home after hospital stay. He has had a BM since surgery.  Objective: Vital signs in last 24 hours: Temp:  [98.5 F (36.9 C)-99.5 F (37.5 C)] 99 F (37.2 C) (01/08 0417) Pulse Rate:  [56-69] 56 (01/08 0417) Resp:  [16-18] 16 (01/08 0417) BP: (113-133)/(61-84) 113/71 (01/08 0417) SpO2:  [93 %-97 %] 96 % (01/08 0417)  Intake/Output from previous day: 01/07 0701 - 01/08 0700 In: 1241.7 [I.V.:1241.7] Out: 600 [Urine:600] Intake/Output this shift: No intake/output data recorded.  Recent Labs    07/26/24 0727 07/27/24 0526 07/28/24 0513  HGB 10.9* 10.2* 10.0*   Recent Labs    07/27/24 0526 07/28/24 0513  WBC 11.3* 9.4  RBC 3.23* 3.13*  HCT 30.4* 29.4*  PLT 182 214   Recent Labs    07/27/24 0526  NA 140  K 4.2  CL 106  CO2 26  BUN 14  CREATININE 0.98  GLUCOSE 103*  CALCIUM 8.7*   No results for input(s): LABPT, INR in the last 72 hours.   EXAM General - Patient is Alert, Appropriate, and Oriented Extremity - Neurovascular intact Sensation intact distally Intact pulses distally No cellulitis present Compartment soft Dressing - Mild bloody drainaged noted to honeycomb dressings.  New honeycomb dressing applied this morning. Left thigh soft to palpation. Intact bowel sounds.  Past Medical History:  Diagnosis Date   Arthritis    Cancer (HCC)    pancreas   Complication of anesthesia    tends to come out of anesthesia during procedure   Depression    Dysphagia    Elevated lipids    Falls    GERD (gastroesophageal reflux disease)    H/O blood clots     superficial blood clot in left leg below knee tx w/ eliquis  x 3 mo   History of kidney stones    Kidney disease    Neuroendocrine tumor (HCC)    Subdural hematoma (HCC)    hx of   Tobacco abuse    Unsteadiness     Assessment/Plan:   4 Days Post-Op Procedures (LRB): FIXATION, FRACTURE, INTERTROCHANTERIC, WITH INTRAMEDULLARY ROD (Left) Principal Problem:   Orthostatic hypotension Active Problems:   Hyperlipidemia   TIA (transient ischemic attack)   RLS (restless legs syndrome)   Peripheral neuropathy   Closed left hip fracture, sequela   Anxiety and depression  Estimated body mass index is 29.37 kg/m as calculated from the following:   Height as of this encounter: 6' 6 (1.981 m).   Weight as of this encounter: 115.3 kg. Advance diet Up with therapy  Pain well controlled Labs and VSS.  WBC down to 9.4, Hg 10.0 CM to assist with discharge. Patient and wife prefer home with HHPT. Up with therapy today.   Patient has had a BM. Pain medication and Lovenox  printed and in patient's chart. Continue with IVF, continue to monitor BP with PT.  Follow up with [facility] in 2 weeks Lovenox  40 mg SQ daily x 14 days at discharge  Ortho will sign off at this time.  DVT Prophylaxis - Lovenox , TED hose, and SCDs Weight-Bearing as tolerated to Left leg  DOROTHA Gustavo Level, PA-C [facility] 07/28/2024, 7:21 AM  ADDENDUM: Patient seen and evaluated.  Concern for wound drainage and swelling of the thigh but pain controlled.  Additionally, patient sustained an IV infiltration of the right upper extremity and now has swelling of the arm at about the level of the proximal forearm to just above the elbow.  Pain in the upper extremity is also well-controlled.  Denies any numbness or tingling.  On exam, dressings were removed and incisions were examined.  They are clean, dry, and intact.  There is no erythema or excessive drainage/purulence.  He does have quite a bit of ecchymoses  about the distal incisions and localized swelling in this area, likely consistent with hematoma.  No blood is expressed from the surgical wounds.  The proximal surgical wound has minimal amount of serosanguineous drainage but no purulence or other signs concerning for infection.  He is grossly neurovascularly intact distally. In regards to the right upper extremity, he is grossly neurovascularly intact sensation is intact to all digits.  He is able to make a full fist and actively range his wrist and elbow without much pain or discomfort.  The area about the infiltrate is very mildly erythematous and warm.  I would recommend that we change his chemical DVT prophylaxis from Lovenox  to aspirin  325 daily x 4 wks. His H/H has been stable and so have a low concern for active hemorrhage at this point.  I changed his dressings and placed a compressive Ace wrap around the thigh.  We will continue to monitor the wounds.  Continue with physical therapy as tolerated.  In regards to his right upper extremity, recommend Ace wrap compression and elevation.  We will monitor closely.  Would recommend low threshold for antibiotics should it appear more like cellulitis.

## 2024-07-28 NOTE — Plan of Care (Signed)

## 2024-07-28 NOTE — Progress Notes (Signed)
 " Progress Note   Patient: Jared SAHAKIAN FMW:[id number] DOB: 26-Oct-1955 DOA: 07/24/2024     4 DOS: the patient was seen and examined on 07/28/2024   Brief hospital course: 69 y.o. male with medical history significant for anxiety/depression, GERD, concern for mild cognitive impairment, chronic peripheral neuropathy, pancreatic neuroendocrine tumor s/p surgery who came into ED after a fall at home this morning.  Patient and his wife stated that patient was trying to move stuffs at home.  He stated that he lost his balance and hung up on the floor and fell on the left side injuring his left hip and left elbow.  He denies any loss of consciousness or hitting head.  Due to severe pain on the left hip EMS was called. MRI of the left hip was done which showed left subcapital femoral neck fracture, Hospital course as below  Assessment and Plan: Orthostatic hypotension - improved S/p IV fluids On midodrine  5 mg TID, compression stocking   Closed left hip fracture, sequela Left hip fracture status post intramedullary nail by Dr. Ezra on 1//26.  Pain control.   PT and OT evaluations.  Patient hoping to go home with home health rather than rehab Asa 325 mg daily for DVT ppx x 4 weeks  Acute drop in hemoglobin Hb dropped from 14.3 on presentation -> 10.0, stable. no active bleeding Likely dilutional due to IV fluids and also hematoma at incision site Monitor Hb   Anxiety and depression On Celexa and Wellbutrin    Peripheral neuropathy Patient wears leg braces   RLS (restless legs syndrome) And as needed Robaxin   Constipation -resolved Had BM today, continue MiraLAX  Discontinue lactulose   RUE cellulitis Developed erythema, swelling and tenderness at the IV infiltrated site Start Keflex   -- Home PT/OT, RW at discharge   Subjective: Patient seen this morning.  Reports feeling better. Orthostatic vitals negative today Seen by orthopedic for drainage from the incision site Anticipate  discharge tomorrow   Physical Exam: Vitals:   07/28/24 0918 07/28/24 0920 07/28/24 0922 07/28/24 1545  BP: (!) 146/95 (!) 150/89 (!) 145/80 130/74  Pulse: 74 73 65 76  Resp: 20 20 20 16   Temp:    99.8 F (37.7 C)  TempSrc:    Oral  SpO2: 97% 98% 99% 94%  Weight:      Height:       Physical Exam HENT:     Head: Normocephalic.     Mouth/Throat:     Pharynx: No oropharyngeal exudate.  Eyes:     General: Lids are normal.     Conjunctiva/sclera: Conjunctivae normal.  Cardiovascular:     Rate and Rhythm: Normal rate and regular rhythm.     Heart sounds: Normal heart sounds, S1 normal and S2 normal.  Pulmonary:     Breath sounds: No decreased breath sounds, wheezing, rhonchi or rales.  Abdominal:     Palpations: Abdomen is soft.     Tenderness: There is no abdominal tenderness.  Skin:    General: Skin is warm.     Findings: No rash.  Neurological:     Mental Status: He is alert and oriented to person, place, and time.  Data Reviewed: Labs and Imaging reviewed  Family Communication: None at the bedside  Disposition: Status is: Inpatient Remains inpatient appropriate because: Orthostatics improved, had drainage from bilateral incisions, stable Anticipate discharge tomorrow if Hb stable and clinically doing better   Planned Discharge Destination: Patient wanting to go home with home health  Time spent: 36 minutes  Author: Laree Lock, MD 07/28/2024 7:13 PM  For on call review [email or web address].  "

## 2024-07-28 NOTE — TOC CM/SW Note (Signed)
 Transition of Care (TOC) CM/SW Note   The patient has a mobility limitation that significantly impairs their ability to participate in one or more mobility-related activities of daily living in the home. The Patient is able to safety use the walker. The functional mobility deficit can be sufficiently resolved by the use of a walker

## 2024-07-28 NOTE — Plan of Care (Signed)

## 2024-07-28 NOTE — Progress Notes (Addendum)
 Physical Therapy Treatment Patient Details Name: Jared [name] MRN: [id number] DOB: 01-Mar-1956 Today's Date: 07/28/2024   History of Present Illness Patient is a 69 year old male who sustained an intertrochanteric fracture after a fall. S/p IM nail left femur. PMH: bilateral lower extremity neuropathy and foot drop    PT Comments  Modified session this am. BP assessed in sitting and standing. Continues to drop upon initial standing, however not as symptomatic as previous days. Pt very fatigued today due to lack of sleep and prolonged hospital stay. Reviewed LE HEP with good understanding. Pt requesting short rest, therapist will return after lunch for gait and potential stair training. Pt is not functionally ready to return home.   Addendum: (14:10) Therapist returned for pm session, wife at bedside. L UE remains edematous, re, and warm to touch. Left thigh with increased edema and significant drainage from incisional sites since dressing changed this am. Waiting on secure chat from Ortho regarding mobility.   If plan is discharge home, recommend the following: A little help with walking and/or transfers;A little help with bathing/dressing/bathroom;Assist for transportation;Assistance with cooking/housework;Help with stairs or ramp for entrance   Can travel by private vehicle        Equipment Recommendations  Other (comment) (TBD, Anticipate no DME needs)    Recommendations for Other Services       Precautions / Restrictions Precautions Precautions: Fall Recall of Precautions/Restrictions: Intact Restrictions Weight Bearing Restrictions Per Provider Order: Yes LLE Weight Bearing Per Provider Order: Weight bearing as tolerated     Mobility  Bed Mobility               General bed mobility comments:  (N/T)    Transfers Overall transfer level: Needs assistance Equipment used: Rolling walker (2 wheels) Transfers: Sit to/from Stand Sit to Stand: Contact guard assist, From  elevated surface           General transfer comment:  (CGA to stand from raised chair on second attempt)    Ambulation/Gait                   Stairs             Wheelchair Mobility     Tilt Bed    Modified Rankin (Stroke Patients Only)       Balance Overall balance assessment: Needs assistance Sitting-balance support: Feet supported Sitting balance-Leahy Scale: Good     Standing balance support: Bilateral upper extremity supported, Reliant on assistive device for balance, During functional activity Standing balance-Leahy Scale: Fair Standing balance comment: with rolling walker for UE support in standing                            Communication Communication Communication: Impaired Factors Affecting Communication: Hearing impaired  Cognition Arousal: Alert Behavior During Therapy: WFL for tasks assessed/performed   PT - Cognitive impairments: No apparent impairments                         Following commands: Intact      Cueing Cueing Techniques: Verbal cues, Visual cues  Exercises General Exercises - Lower Extremity Long Arc Quad: AROM, Left, 10 reps Hip Flexion/Marching: AROM, Left, 5 reps Other Exercises Other Exercises:  (Pt given LE HEP for supine and sitting. Reviewed with good understanding and teach back)    General Comments General comments (skin integrity, edema, etc.):  (Seated BP 129/70, HR 66  Initial Standing105/60, HR 90  3 Minutes Standing 133/65, HR 77  Slightly symptomatic)      Pertinent Vitals/Pain Pain Assessment Pain Assessment: Faces Faces Pain Scale: Hurts little more Pain Location: L hip with movement Pain Descriptors / Indicators: Discomfort Pain Intervention(s): Monitored during session    Home Living                          Prior Function            PT Goals (current goals can now be found in the care plan section) Acute Rehab PT Goals Patient Stated Goal: to go home  if possible    Frequency    7X/week      PT Plan      Co-evaluation              AM-PAC PT 6 Clicks Mobility   Outcome Measure  Help needed turning from your back to your side while in a flat bed without using bedrails?: A Lot Help needed moving from lying on your back to sitting on the side of a flat bed without using bedrails?: A Little Help needed moving to and from a bed to a chair (including a wheelchair)?: A Lot Help needed standing up from a chair using your arms (e.g., wheelchair or bedside chair)?: A Lot Help needed to walk in hospital room?: A Little Help needed climbing 3-5 steps with a railing? : A Little 6 Click Score: 15    End of Session   Activity Tolerance: Treatment limited secondary to medical complications (Comment) Patient left: in chair;with call bell/phone within reach Nurse Communication: Mobility status;Other (comment) PT Visit Diagnosis: Difficulty in walking, not elsewhere classified (R26.2);Other abnormalities of gait and mobility (R26.89)     Time: 9079-9059 PT Time Calculation (min) (ACUTE ONLY): 20 min  Charges:    $Therapeutic Activity: 8-22 mins PT General Charges $$ ACUTE PT VISIT: 1 Visit                    [name], PTA  [name] 07/28/2024, 10:26 AM

## 2024-07-29 DIAGNOSIS — I951 Orthostatic hypotension: Secondary | ICD-10-CM | POA: Diagnosis not present

## 2024-07-29 LAB — CBC
HCT: 28.9 % — ABNORMAL LOW (ref 39.0–52.0)
Hemoglobin: 9.9 g/dL — ABNORMAL LOW (ref 13.0–17.0)
MCH: 32.6 pg (ref 26.0–34.0)
MCHC: 34.3 g/dL (ref 30.0–36.0)
MCV: 95.1 fL (ref 80.0–100.0)
Platelets: 235 K/uL (ref 150–400)
RBC: 3.04 MIL/uL — ABNORMAL LOW (ref 4.22–5.81)
RDW: 15.3 % (ref 11.5–15.5)
WBC: 11.5 K/uL — ABNORMAL HIGH (ref 4.0–10.5)
nRBC: 0 % (ref 0.0–0.2)

## 2024-07-29 MED ORDER — CEPHALEXIN 500 MG PO CAPS: 500.0000 mg | ORAL_CAPSULE | Freq: Four times a day (QID) | ORAL | 0 refills | Status: AC

## 2024-07-29 MED ORDER — ACETAMINOPHEN 325 MG PO TABS: 650.0000 mg | ORAL_TABLET | Freq: Four times a day (QID) | ORAL | 0 refills | Status: AC | PRN

## 2024-07-29 MED ORDER — METHOCARBAMOL 500 MG PO TABS: 250.0000 mg | ORAL_TABLET | Freq: Every evening | ORAL | 0 refills | Status: AC | PRN

## 2024-07-29 MED ORDER — POLYETHYLENE GLYCOL 3350 17 G PO PACK: 17.0000 g | PACK | Freq: Every day | ORAL | 0 refills | Status: AC | PRN

## 2024-07-29 MED ORDER — ASPIRIN 325 MG PO TABS: 325.0000 mg | ORAL_TABLET | Freq: Every day | ORAL | 0 refills | Status: AC

## 2024-07-29 MED ORDER — OXYCODONE HCL 5 MG PO TABS: 5.0000 mg | ORAL_TABLET | ORAL | 0 refills | Status: AC | PRN

## 2024-07-29 MED ORDER — MIDODRINE HCL 5 MG PO TABS: 5.0000 mg | ORAL_TABLET | Freq: Three times a day (TID) | ORAL | 1 refills | Status: AC

## 2024-07-29 NOTE — Plan of Care (Signed)

## 2024-07-29 NOTE — Progress Notes (Signed)
 Physical Therapy Treatment Patient Details Name: Jared [name] MRN: [id number] DOB: [birth year] Today's Date: 07/29/2024   History of Present Illness Patient is a 69 year old male who sustained an intertrochanteric fracture after a fall. S/p IM nail left femur. PMH: bilateral lower extremity neuropathy and foot drop    PT Comments  Pt received up in chair, c/o fatigue this pm. Improved ability to stand from raised chair with CGA due to improved pain control. Pt completed gait training in room with RW and CGA ~13ft. Assisted back to bed with MinA. Wife not present for gait +/or stair training. Pt currently progressing well. Will continue acute PT until medically cleared for d/c.   If plan is discharge home, recommend the following: A little help with walking and/or transfers;A little help with bathing/dressing/bathroom;Assist for transportation;Assistance with cooking/housework;Help with stairs or ramp for entrance   Can travel by private vehicle        Equipment Recommendations  Other (comment) (Pt has received RW and gait belt for home use)    Recommendations for Other Services       Precautions / Restrictions Precautions Precautions: Fall Recall of Precautions/Restrictions: Intact Precaution/Restrictions Comments:  (Pt with new L hip incision portable wound vac) Restrictions Weight Bearing Restrictions Per Provider Order: Yes LLE Weight Bearing Per Provider Order: Weight bearing as tolerated Other Position/Activity Restrictions: L Hip ORIF     Mobility  Bed Mobility Overal bed mobility: Needs Assistance Bed Mobility: Sit to Supine       Sit to supine: Min assist, Used rails (Assist for B LE's)   General bed mobility comments:  (N/T in chair pre/post session)    Transfers Overall transfer level: Needs assistance Equipment used: Rolling walker (2 wheels) Transfers: Sit to/from Stand Sit to Stand: Min assist, Contact guard assist, From elevated surface            General transfer comment:  (Improved ability to stand from seated position this pm)    Ambulation/Gait Ambulation/Gait assistance: Contact guard assist Gait Distance (Feet): 40 Feet Assistive device: Rolling walker (2 wheels) Gait Pattern/deviations: Decreased stance time - left, Decreased stride length, Decreased dorsiflexion - left, Step-through pattern, Antalgic Gait velocity: decreased     General Gait Details: Pt did not don B AFO's for short distance gait and did require MinA for balance while turning around. Otherwise CGA for safety   Stairs Stairs: Yes Stairs assistance: Min assist, Mod assist Stair Management: One rail Left, Alternating pattern, Sideways Number of Stairs: 4 General stair comments:  (If still here on Saturday and wife is present, may benefit from practice)   Wheelchair Mobility     Tilt Bed    Modified Rankin (Stroke Patients Only)       Balance Overall balance assessment: Needs assistance Sitting-balance support: Feet supported Sitting balance-Leahy Scale: Good     Standing balance support: Bilateral upper extremity supported, Reliant on assistive device for balance, During functional activity Standing balance-Leahy Scale: Fair Standing balance comment: with rolling walker for UE support in standing                            Communication Communication Communication: Impaired Factors Affecting Communication: Hearing impaired  Cognition Arousal: Alert Behavior During Therapy: WFL for tasks assessed/performed   PT - Cognitive impairments: No apparent impairments                       PT -  Cognition Comments:  (Very pleasant and cooperative) Following commands: Intact      Cueing Cueing Techniques: Verbal cues, Visual cues  Exercises Other Exercises Other Exercises:  (Pt educated on stair negotiation and given handout with visual instructions for proper sequencing)    General Comments General comments (skin  integrity, edema, etc.):  (new L hip wound vac intact)      Pertinent Vitals/Pain Pain Assessment Pain Assessment: Faces Faces Pain Scale: Hurts little more Pain Location: L hip with movement Pain Descriptors / Indicators: Discomfort Pain Intervention(s): Limited activity within patient's tolerance    Home Living                          Prior Function            PT Goals (current goals can now be found in the care plan section) Acute Rehab PT Goals Patient Stated Goal: to go home if possible Progress towards PT goals: Progressing toward goals    Frequency    7X/week      PT Plan      Co-evaluation              AM-PAC PT 6 Clicks Mobility   Outcome Measure  Help needed turning from your back to your side while in a flat bed without using bedrails?: A Lot Help needed moving from lying on your back to sitting on the side of a flat bed without using bedrails?: A Little Help needed moving to and from a bed to a chair (including a wheelchair)?: A Little Help needed standing up from a chair using your arms (e.g., wheelchair or bedside chair)?: A Little Help needed to walk in hospital room?: A Little Help needed climbing 3-5 steps with a railing? : A Little 6 Click Score: 17    End of Session Equipment Utilized During Treatment: Gait belt Activity Tolerance: Treatment limited secondary to medical complications (Comment) Patient left: with call bell/phone within reach;in bed;with bed alarm set Nurse Communication: Mobility status;Other (comment) PT Visit Diagnosis: Difficulty in walking, not elsewhere classified (R26.2);Other abnormalities of gait and mobility (R26.89)     Time: 1350-1404 PT Time Calculation (min) (ACUTE ONLY): 14 min  Charges:    $Gait Training: 23-37 mins $Therapeutic Activity: 8-22 mins PT General Charges $$ ACUTE PT VISIT: 1 Visit                    Jared [name], PTA  Jared [name] 07/29/2024, 2:47 PM

## 2024-07-29 NOTE — Discharge Summary (Signed)
 " Physician Discharge Summary   Patient: Jared [name] MRN: [id number] DOB: [birth year]  Admit date:     07/24/2024  Discharge date: 07/29/2024  Discharge Physician: Laree Lock   PCP: Glover Lenis, MD   Recommendations at discharge:   Follow-up with PCP -repeat CBC within 1 week Follow-up with orthopedic outpatient -has vac, appointment will be made  Home PT/OT/RN arranged Rollator walker  Discharge Diagnoses: Principal Problem:   Orthostatic hypotension Active Problems:   Closed left hip fracture, sequela   Hyperlipidemia   TIA (transient ischemic attack)   RLS (restless legs syndrome)   Peripheral neuropathy   Anxiety and depression   Hospital Course: 69 y.o. male with medical history significant for anxiety/depression, GERD, concern for mild cognitive impairment, chronic peripheral neuropathy, pancreatic neuroendocrine tumor s/p surgery who came into ED after a fall at home this morning.  Patient and his wife stated that patient was trying to move stuffs at home.  He stated that he lost his balance and hung up on the floor and fell on the left side injuring his left hip and left elbow.  He denies any loss of consciousness or hitting head.  Due to severe pain on the left hip EMS was called. MRI of the left hip was done which showed left subcapital femoral neck fracture, Hospital course as below  Orthostatic hypotension - improved S/p IV fluids On midodrine  5 mg TID, compression stocking Repeat Orthostatic vitals negative on midodrine  Follow-up with PCP   Closed left hip fracture, sequela Left hip fracture status post intramedullary nail by Dr. Ezra on 1/04.  Pain control with Tylenol , low-dose oxycodone  Wound vac by Ortho for drainage Asa 325 mg daily for DVT ppx for 4 weeks Home PT/OT   Acute drop in hemoglobin Hb dropped from 14.3 on presentation -> 10.0 -> 9.9, stable. no active bleeding Likely dilutional due to IV fluids and also hematoma at incision  site Monitor Hb outpatient   Anxiety and depression On Celexa and Wellbutrin    Peripheral neuropathy Patient wears leg braces   RLS (restless legs syndrome) as needed Robaxin    Constipation -resolved continue MiraLAX , on low dose opioids for pain   RUE cellulitis Developed erythema, swelling and tenderness at the IV infiltrated site Mild leukocytosis Complete Keflex  for 5-days   -- Home PT/OT/RN, RW at discharge   Pain control - [place]  Controlled Substance Reporting System database was reviewed. and patient was instructed, not to drive, operate heavy machinery, perform activities at heights, swimming or participation in water activities or provide baby-sitting services while on Pain, Sleep and Anxiety Medications; until their outpatient Physician has advised to do so again. Also recommended to not to take more than prescribed Pain, Sleep and Anxiety Medications.  Consultants: Orthopedic surgery Procedures performed: Left hip fracture status post intramedullary nail  01/04 Disposition: Home health Diet recommendation:  Discharge Diet Orders (From admission, onward)     Start     Ordered   07/29/24 0000  Diet general        07/29/24 1521           DISCHARGE MEDICATION: Allergies as of 07/29/2024   No Known Allergies      Medication List     STOP taking these medications    ibuprofen  200 MG tablet Commonly known as: ADVIL        TAKE these medications    acetaminophen  325 MG tablet Commonly known as: TYLENOL  Take 2 tablets (650 mg total) by mouth every 6 (six)  hours as needed for mild pain (pain score 1-3) or fever (or Fever >/= 101). What changed:  medication strength how much to take reasons to take this   aspirin  325 MG tablet Take 1 tablet (325 mg total) by mouth daily. Start taking on: July 30, 2024   buPROPion  300 MG 24 hr tablet Commonly known as: WELLBUTRIN  XL Take 1 tablet (300 mg total) by mouth daily.   cephALEXin  500 MG  capsule Commonly known as: KEFLEX  Take 1 capsule (500 mg total) by mouth every 6 (six) hours for 4 days.   clotrimazole -betamethasone  cream Commonly known as: LOTRISONE  Apply 1 Application topically 2 (two) times daily.   escitalopram  20 MG tablet Commonly known as: LEXAPRO  Take 1 tablet (20 mg total) by mouth daily.   methocarbamol  500 MG tablet Commonly known as: ROBAXIN  Take 0.5 tablets (250 mg total) by mouth at bedtime as needed for muscle spasms.   midodrine  5 MG tablet Commonly known as: PROAMATINE  Take 1 tablet (5 mg total) by mouth 3 (three) times daily with meals.   mirtazapine  7.5 MG tablet Commonly known as: REMERON  Take 7.5 mg by mouth at bedtime.   oxyCODONE  5 MG immediate release tablet Commonly known as: Oxy IR/ROXICODONE  Take 1 tablet (5 mg total) by mouth every 4 (four) hours as needed for moderate pain (pain score 4-6).   pantoprazole  40 MG tablet Commonly known as: PROTONIX  Take 40 mg by mouth daily.   polyethylene glycol 17 g packet Commonly known as: MIRALAX  / GLYCOLAX  Take 17 g by mouth daily as needed for mild constipation.   sildenafil  20 MG tablet Commonly known as: REVATIO  Take 1 tablet (20 mg total) by mouth daily as needed (TAKE 30 MINUTES PRIOR TO SEXUAL ACTIVITY).   tadalafil  10 MG tablet Commonly known as: CIALIS  Take 1 tablet (10 mg total) by mouth daily as needed for erectile dysfunction.   Uribel  118 MG Caps Take 1 capsule (118 mg total) by mouth daily as needed (dysuria).               Durable Medical Equipment  (From admission, onward)           Start     Ordered   07/29/24 0000  For home use only DME Vac        07/29/24 1207   07/28/24 0000  For home use only DME Walker rolling       Question Answer Comment  Walker: With 5 Inch Wheels   Patient needs a walker to treat with the following condition Hip fracture (HCC)      07/28/24 1500            Contact information for follow-up providers      Glover Lenis, MD. Call in 1 day.   Specialty: Family Medicine Why: For ER visit follow-up and reevaluation. Contact information: [address] [place] [address] [phone]         Ezra Jackquline RAMAN, MD Follow up in 2 week(s).   Specialty: Orthopedic Surgery Contact information: [address] [place] [address] [phone]              Contact information for after-discharge care     Home Medical Care     [facility]) .   Service: Home Health Services Contact information: [address] [place]  [address] [phone]  Discharge Exam: Filed Weights   07/24/24 0926  Weight: 115.3 kg   Cardiovascular:     Rate and Rhythm: Normal rate and regular rhythm.     Heart sounds: Normal heart sounds, S1 normal and S2 normal.  Pulmonary:     Breath sounds: No decreased breath sounds, wheezing, rhonchi or rales.  Abdominal:     Palpations: Abdomen is soft.     Tenderness: There is no abdominal tenderness.  Skin:    General: Skin is warm.     Findings: No rash.  Neurological:     Mental Status: He is alert and oriented to person, place, and time  Condition at discharge: good  The results of significant diagnostics from this hospitalization (including imaging, microbiology, ancillary and laboratory) are listed below for reference.   Imaging Studies: DG HIP UNILAT WITH PELVIS 2-3 VIEWS LEFT Result Date: 07/24/2024 EXAM: Fluoroscopic images (6 spot images) XRAY OF THE LEFT HIP 07/24/2024 05:50:04 PM Fluoroscopy time: 1 minute 22 seconds Dose: 39.54 mGy COMPARISON: Comparison with 07/24/2024. CLINICAL HISTORY: [id number] Surgery, elective [id number] Surgery, elective [phone] FINDINGS: Intraoperative fluoroscopy was utilized for surgical control purposes, demonstrating internal fixation of the left hip. BONES AND JOINTS: Fluoroscopic images obtained demonstrate placement of 4 cm mandibular  rod with distal locking screw and compression bolts fixing the femoral neck. IMPRESSION: 1. Intraoperative fluoroscopy was utilized for surgical control purposes, demonstrating internal fixation of the left hip. Electronically signed by: Elsie Gravely MD 07/24/2024 05:56 PM EST RP Workstation: [id number]   DG C-Arm 1-60 Min-No Report Result Date: 07/24/2024 Fluoroscopy was utilized by the requesting physician.  No radiographic interpretation.   DG C-Arm 1-60 Min-No Report Result Date: 07/24/2024 Fluoroscopy was utilized by the requesting physician.  No radiographic interpretation.   MR HIP LEFT WO CONTRAST Result Date: 07/24/2024 EXAM: MRI of the left HIP WITHOUT contrast. 07/24/2024 10:36:04 AM TECHNIQUE: Multiplanar multisequence MRI of the left hip was performed without the administration of intravenous contrast. COMPARISON: Plain film radiograph of the pelvis from 12/19/2024. CLINICAL HISTORY: Hip trauma, fracture suspected, no prior imaging. FINDINGS: BONE MARROW: Acute fracture involving the proximal left femur with disruption of the proximal femoral cortex with fracture line extending from the lateral subcapital femoral neck to the lesser trochanter as well as the intertrochanteric region. Marked surrounding bone marrow edema is identified. Previous open reduction and internal fixation of the right femur with intramedullary rod and cannulated screw in place with signal artifact. HIP JOINT: Small left hip joint effusion. No subchondral cysts or significant degenerative disease. SOFT TISSUES: Hemorrhages identified into the soft tissue surrounding the proximal left femur. No focal soft tissue mass. INTRAPELVIC CONTENTS: Limited images of the intrapelvic contents demonstrate no acute abnormality. IMPRESSION: 1. Acute, slightly impacted, subcapital left proximal femur fracture involving the femoral neck with intertrochanteric extension. Electronically signed by: Waddell Calk MD 07/24/2024 02:04 PM EST RP  Workstation: [id number]   DG Femur Min 2 Views Left Result Date: 07/24/2024 CLINICAL DATA:  Left hip pain after fall EXAM: LEFT FEMUR 2 VIEWS COMPARISON:  None Available. FINDINGS: There is no evidence of fracture or other focal bone lesions. Soft tissues are unremarkable. IMPRESSION: Negative. Electronically Signed   By: Lynwood Landy Raddle M.D.   On: 07/24/2024 10:27   DG Pelvis 1-2 Views Result Date: 07/24/2024 CLINICAL DATA:  Pelvic pain after fall EXAM: PELVIS - 1-2 VIEW COMPARISON:  None Available. FINDINGS: There is no evidence of acute pelvic fracture or diastasis. Status post surgical internal fixation of old proximal  right femoral fracture. No pelvic bone lesions are seen. IMPRESSION: No acute abnormality seen. Electronically Signed   By: Lynwood Landy Raddle M.D.   On: 07/24/2024 10:23    Microbiology: Results for orders placed or performed during the hospital encounter of 03/20/23  Urine Culture     Status: None   Collection Time: 03/20/23  4:04 PM   Specimen: Urine, Clean Catch  Result Value Ref Range Status   Specimen Description URINE, CLEAN CATCH  Final   Special Requests NONE  Final   Culture   Final    NO GROWTH Performed at [facility], [address], [place], [place] [address]    Report Status 03/21/2023 FINAL  Final    Labs: CBC: Recent Labs  Lab 07/24/24 2039 07/25/24 0551 07/26/24 0727 07/27/24 0526 07/28/24 0513 07/29/24 0553  WBC 12.8* 11.5*  --  11.3* 9.4 11.5*  HGB 13.0 11.5* 10.9* 10.2* 10.0* 9.9*  HCT 38.1* 34.0*  --  30.4* 29.4* 28.9*  MCV 92.5 92.9  --  94.1 93.9 95.1  PLT 212 188  --  182 214 235   Basic Metabolic Panel: Recent Labs  Lab 07/24/24 1030 07/24/24 2039 07/25/24 0551 07/27/24 0526 07/28/24 0513  NA 142  --  136 140 139  K 4.4  --  4.5 4.2 3.9  CL 105  --  105 106 106  CO2 25  --  22 26 27   GLUCOSE 102*  --  166* 103* 107*  BUN 16  --  18 14 15   CREATININE 1.08 1.02 1.05 0.98 0.95  CALCIUM 9.5  --  8.5* 8.7* 8.9   Liver  Function Tests: Recent Labs  Lab 07/25/24 0551  AST 26  ALT 15  ALKPHOS 70  BILITOT 0.9  PROT 6.1*  ALBUMIN 3.6   CBG: Recent Labs  Lab 07/26/24 0752  GLUCAP 94    Discharge time spent: greater than 30 minutes.  Signed: Laree Lock, MD [facility] 07/29/2024 "

## 2024-07-29 NOTE — Progress Notes (Signed)
 Physical Therapy Treatment Patient Details Name: Jared [name] MRN: [id number] DOB: 10-Jun-1956 Today's Date: 07/29/2024   History of Present Illness Patient is a 69 year old male who sustained an intertrochanteric fracture after a fall. S/p IM nail left femur. PMH: bilateral lower extremity neuropathy and foot drop    PT Comments  Pt received in chair, L UE less edematous, re-wrapped ace wrap with two 3 inch ace. Pants, shirt, and B AFO's donned with assist. ModA to stand from raised surface. Pt completed gait training with RW ~63ft prior to fatiguing. Heavy reliance on RW, antalgic swing through gait with close CGA. No significant dizziness. Stair training with B Ue's on Left rail, step to pattern, repeated cues for sequencing with Min/ModA. Will need to practice again and increase safety with gait training prior to d/c.    If plan is discharge home, recommend the following: A little help with walking and/or transfers;A little help with bathing/dressing/bathroom;Assist for transportation;Assistance with cooking/housework;Help with stairs or ramp for entrance   Can travel by private vehicle        Equipment Recommendations  Rolling walker (2 wheels)    Recommendations for Other Services       Precautions / Restrictions Precautions Precautions: Fall Recall of Precautions/Restrictions: Intact Restrictions Weight Bearing Restrictions Per Provider Order: Yes LLE Weight Bearing Per Provider Order: Weight bearing as tolerated Other Position/Activity Restrictions: L Hip ORIF     Mobility  Bed Mobility               General bed mobility comments:  (N/T in chair pre/post session)    Transfers Overall transfer level: Needs assistance Equipment used: Rolling walker (2 wheels) Transfers: Sit to/from Stand Sit to Stand: Mod assist           General transfer comment:  (Increaed assistance needed for sit to stand transfers this date due to limited mobility since surgery)     Ambulation/Gait Ambulation/Gait assistance: Contact guard assist Gait Distance (Feet): 20 Feet Assistive device: Rolling walker (2 wheels) Gait Pattern/deviations: Decreased stance time - left, Decreased stride length, Decreased dorsiflexion - left, Step-through pattern Gait velocity: decreased     General Gait Details: patient is able to clear bilateral feet with AFOs in place with swing phase of gait. Remains antalgic   Stairs Stairs: Yes Stairs assistance: Min assist, Mod assist Stair Management: One rail Left, Alternating pattern, Sideways Number of Stairs: 4 General stair comments:  (Increased assist for balance and sequencing. Pt struggled due to B AFO's and narrow steps with large feet)   Wheelchair Mobility     Tilt Bed    Modified Rankin (Stroke Patients Only)       Balance Overall balance assessment: Needs assistance Sitting-balance support: Feet supported Sitting balance-Leahy Scale: Good     Standing balance support: Bilateral upper extremity supported, Reliant on assistive device for balance, During functional activity Standing balance-Leahy Scale: Fair Standing balance comment: with rolling walker for UE support in standing                            Communication Communication Communication: Impaired Factors Affecting Communication: Hearing impaired  Cognition Arousal: Alert Behavior During Therapy: WFL for tasks assessed/performed   PT - Cognitive impairments: No apparent impairments                       PT - Cognition Comments:  (Very pleasant and cooperative) Following commands: Intact  Cueing Cueing Techniques: Verbal cues, Visual cues  Exercises      General Comments General comments (skin integrity, edema, etc.):  (L UE edema improved, ace wrap applied)      Pertinent Vitals/Pain Pain Assessment Pain Assessment: Faces Faces Pain Scale: Hurts little more Pain Location: L hip with movement Pain  Descriptors / Indicators: Discomfort Pain Intervention(s): Limited activity within patient's tolerance    Home Living                          Prior Function            PT Goals (current goals can now be found in the care plan section) Acute Rehab PT Goals Patient Stated Goal: to go home if possible Progress towards PT goals: Progressing toward goals    Frequency    7X/week      PT Plan      Co-evaluation              AM-PAC PT 6 Clicks Mobility   Outcome Measure  Help needed turning from your back to your side while in a flat bed without using bedrails?: A Lot Help needed moving from lying on your back to sitting on the side of a flat bed without using bedrails?: A Little Help needed moving to and from a bed to a chair (including a wheelchair)?: A Lot Help needed standing up from a chair using your arms (e.g., wheelchair or bedside chair)?: A Lot Help needed to walk in hospital room?: A Little Help needed climbing 3-5 steps with a railing? : A Little 6 Click Score: 15    End of Session Equipment Utilized During Treatment: Gait belt Activity Tolerance: Treatment limited secondary to medical complications (Comment) Patient left: in chair;with call bell/phone within reach Nurse Communication: Mobility status;Other (comment) PT Visit Diagnosis: Difficulty in walking, not elsewhere classified (R26.2);Other abnormalities of gait and mobility (R26.89)     Time: 8969-8885 PT Time Calculation (min) (ACUTE ONLY): 44 min  Charges:    $Gait Training: 23-37 mins $Therapeutic Activity: 8-22 mins PT General Charges $$ ACUTE PT VISIT: 1 Visit                    [name], PTA  [name] 07/29/2024, 12:56 PM

## 2024-07-29 NOTE — TOC Transition Note (Signed)
 Transition of Care [facility]) - Discharge Note   Patient Details  Name: Jared [name] MRN: [id number] Date of Birth: September 09, 1955  [facility] CM/SW Contact:  Alvaro Louder, LCSW Phone Number: 07/29/2024, 3:45 PM   Clinical Narrative:   [name] discussed PT recommendation of HH Patient was agreeable. LCSWA reached out to [facility] admissions coordinator an started service for patient. The patient reported that he would have his spouse come pick him up at discharge.   TOC signing off   Final next level of care: Home w Home Health Services Barriers to Discharge: No Barriers Identified   Patient Goals and CMS Choice            Discharge Placement              Patient chooses bed at:  (Home) Patient to be transferred to facility by: Spouse Name of family member notified: Self Patient and family notified of of transfer: 07/29/24  Discharge Plan and Services Additional resources added to the After Visit Summary for                            [facility] Arranged: PT, OT HH Agency: [facility] Date [facility] Contacted: 07/29/24   Representative spoke with at [facility] Agency: Darleene  Social Drivers of Health (SDOH) Interventions SDOH Screenings   Food Insecurity: No Food Insecurity (07/24/2024)  Housing: Low Risk (07/24/2024)  Transportation Needs: No Transportation Needs (07/24/2024)  Utilities: Not At Risk (07/24/2024)  Depression (PHQ2-9): Medium Risk (06/02/2024)  Financial Resource Strain: Low Risk  (05/17/2024)   Received from [facility]  Social Connections: Socially Integrated (07/24/2024)  Tobacco Use: Medium Risk (07/24/2024)     Readmission Risk Interventions     No data to display

## 2024-07-29 NOTE — Progress Notes (Incomplete)
 Subjective: 5 Days Post-Op Procedures (LRB): FIXATION, FRACTURE, INTERTROCHANTERIC, WITH INTRAMEDULLARY ROD (Left) Patient reports pain as mild in the left hip this morning. Patient is well, and has had no acute complaints or problems Became orthostatic with PT, continuing to monitor.  Currently receiving IV fluid bolus. Denies any CP, SOB, ABD pain. We will continue therapy today.  Plan is to go Home after hospital stay. He has had a BM since surgery.  Objective: Vital signs in last 24 hours: Temp:  [97.8 F (36.6 C)-99.8 F (37.7 C)] 98.2 F (36.8 C) (01/09 0729) Pulse Rate:  [59-76] 62 (01/09 0729) Resp:  [15-20] 18 (01/09 0729) BP: (122-150)/(70-100) 122/73 (01/09 0729) SpO2:  [94 %-99 %] 95 % (01/09 0729)  Intake/Output from previous day: 01/08 0701 - 01/09 0700 In: 150 [P.O.:150] Out: 600 [Urine:600] Intake/Output this shift: No intake/output data recorded.  Recent Labs    07/27/24 0526 07/28/24 0513 07/29/24 0553  HGB 10.2* 10.0* 9.9*   Recent Labs    07/28/24 0513 07/29/24 0553  WBC 9.4 11.5*  RBC 3.13* 3.04*  HCT 29.4* 28.9*  PLT 214 235   Recent Labs    07/27/24 0526 07/28/24 0513  NA 140 139  K 4.2 3.9  CL 106 106  CO2 26 27  BUN 14 15  CREATININE 0.98 0.95  GLUCOSE 103* 107*  CALCIUM 8.7* 8.9   No results for input(s): LABPT, INR in the last 72 hours.   EXAM General - Patient is Alert, Appropriate, and Oriented Extremity - Neurovascular intact Sensation intact distally Intact pulses distally No cellulitis present Compartment soft Dressing - Mild bloody drainaged noted to honeycomb dressings.  New honeycomb dressing applied this morning. Left thigh soft to palpation. Intact bowel sounds.  Past Medical History:  Diagnosis Date   Arthritis    Cancer (HCC)    pancreas   Complication of anesthesia    tends to come out of anesthesia during procedure   Depression    Dysphagia    Elevated lipids    Falls    GERD  (gastroesophageal reflux disease)    H/O blood clots    superficial blood clot in left leg below knee tx w/ eliquis  x 3 mo   History of kidney stones    Kidney disease    Neuroendocrine tumor (HCC)    Subdural hematoma (HCC)    hx of   Tobacco abuse    Unsteadiness     Assessment/Plan:   5 Days Post-Op Procedures (LRB): FIXATION, FRACTURE, INTERTROCHANTERIC, WITH INTRAMEDULLARY ROD (Left) Principal Problem:   Orthostatic hypotension Active Problems:   Hyperlipidemia   TIA (transient ischemic attack)   RLS (restless legs syndrome)   Peripheral neuropathy   Closed left hip fracture, sequela   Anxiety and depression  Estimated body mass index is 29.37 kg/m as calculated from the following:   Height as of this encounter: 6' 6 (1.981 m).   Weight as of this encounter: 115.3 kg. Advance diet Up with therapy  Pain well controlled Labs and VSS.  WBC down to 9.4, Hg 10.0 CM to assist with discharge. Patient and wife prefer home with HHPT. Up with therapy today.   Patient has had a BM. Pain medication and Lovenox  printed and in patient's chart. Continue with IVF, continue to monitor BP with PT.  Follow up with [facility] in 2 weeks Lovenox  40 mg SQ daily x 14 days at discharge  Ortho will sign off at this time.  DVT Prophylaxis - Lovenox , TED  hose, and SCDs Weight-Bearing as tolerated to Left leg  J. Gustavo Level, PA-C [facility] 07/29/2024, 7:42 AM  ADDENDUM: Patient seen and evaluated.  Concern for wound drainage and swelling of the thigh but pain controlled.  Additionally, patient sustained an IV infiltration of the right upper extremity and now has swelling of the arm at about the level of the proximal forearm to just above the elbow.  Pain in the upper extremity is also well-controlled.  Denies any numbness or tingling.  On exam, dressings were removed and incisions were examined.  They are clean, dry, and intact.  There is no erythema or excessive  drainage/purulence.  He does have quite a bit of ecchymoses about the distal incisions and localized swelling in this area, likely consistent with hematoma.  No blood is expressed from the surgical wounds.  The proximal surgical wound has minimal amount of serosanguineous drainage but no purulence or other signs concerning for infection.  He is grossly neurovascularly intact distally. In regards to the right upper extremity, he is grossly neurovascularly intact sensation is intact to all digits.  He is able to make a full fist and actively range his wrist and elbow without much pain or discomfort.  The area about the infiltrate is very mildly erythematous and warm.  I would recommend that we change his chemical DVT prophylaxis from Lovenox  to aspirin  325 daily x 4 wks. His H/H has been stable and so have a low concern for active hemorrhage at this point.  I changed his dressings and placed a compressive Ace wrap around the thigh.  We will continue to monitor the wounds.  Continue with physical therapy as tolerated.  In regards to his right upper extremity, recommend Ace wrap compression and elevation.  We will monitor closely.  Would recommend low threshold for antibiotics should it appear more like cellulitis.

## 2024-08-03 NOTE — Progress Notes (Signed)
 "   ORTHOPAEDIC SURGERY- CLINIC NOTE  Surgery: Left hip cephalomedullary nail placement; 07/24/2024  History of Present Illness:  Jared [name] is a 69 y.o. male who presents today for a postoperative visit s/p the above procedure.  He him back to clinic at a quicker interval than typical as he was having some drainage from his proximal incision site.  He was getting a fair amount of IV fluids while he was in the hospital as he was having issues with orthostatic hypotension.  This may have contributed to his increased drainage.  We placed an incisional wound VAC on the proximal incision.  He presents for a wound check.  Additionally, he developed a thigh hematoma about the more distal incision.  This has been controlled with elevation and Ace wrap compression.  His H/H numbers while admitted stayed stable.  He also stained an IV fluid infiltration to his right forearm while he was in the hospital.  This was also managed with elevation and compression wraps.  Overall, he is doing better.  His pain is well-controlled.  His arm is doing much better and is no longer swollen or red; he was ultimately placed on Keflex  for this and it is doing much better.  He has had no drainage from the incisional wound VAC since the first day being discharged.  His thigh hematoma has improved. he has been able to tolerate getting up and walking better.  He has been working with home health PT.  He has been able to ambulate with the assistance of a walker.  He says that pain in his hip is fairly well-controlled but he has some occasional pain in the knee.  He is requesting a refill on his postoperative pain medication  Physical Exam: BP (!) 142/80   Ht 198.1 cm (6' 6)   Wt (!) 110.2 kg (243 lb)   BMI 28.08 kg/m  General/Constitutional: No apparent distress: well-nourished and well developed. Eyes: Pupils equal, round with synchronous movement. Neuro/Psych: Normal mood and affect, oriented to person, place and  time. Musculoskeletal: see exam below  Left Lower Extremity: Incision is clean, dry, intact. No significant erythema, drainage, or other signs of infection. Staples are in place.  No drainage be expressed from the wounds.  He has some ecchymoses still noted about the lateral and posterior aspects of the thigh but this is improved to it compared to when he was in the hospital.  The swelling of the lateral aspect of the thigh has also improved.  Sensation to light touch decreased to the lower aspect of the leg as well as ankle/toe dorsi and plantarflexion as was the patient's baseline prior to surgery.  Right Upper Extremity: Very minimal swelling and erythema at the IV infiltration site.  He is able to make a full composite fist and fully extend all of his digits.  Sensations intact to light touch throughout.  Digits are warm and well-perfused.  He has full range of motion of the wrist, forearm, elbow.  Imaging and Results: Radiographs of the left hip were obtained in clinic today and personally interpreted. The hardware appears to be intact without any significant migration or compromise.  The fracture remains adequately reduced with more evidence of a fracture line as compared to previously.  No obvious bony healing as of yet.  Assessment & Plan: Jared [name] is a 69 y.o. male patient who is s/p left hip cephalomedullary nail placement; 07/24/2024 - Staples removed in clinic today - Continue working with HHPT -  WBAT LLE - DVT ppx - DC incisional wound vac - Short course of hydrocodone  sent to pharmacy; counseled patient that I would not be able to prescribe any more narcotic pain medications after this - Follow-up in 4 weeks   Jackquline CANDIE Barrack, MD [facility] [address] [place], [place] [address] Phone: [phone]  This note was generated in part with voice recognition software; please excuse any typographical errors that were not detected  and corrected.  "

## 2024-09-12 ENCOUNTER — Ambulatory Visit: Admitting: Psychiatry

## 2024-11-08 ENCOUNTER — Ambulatory Visit: Admitting: Urology

## 2024-11-09 ENCOUNTER — Ambulatory Visit: Admitting: Urology
